# Patient Record
Sex: Female | Born: 1970 | Race: White | Hispanic: No | Marital: Married | Smoking: Never smoker
Health system: Southern US, Community
[De-identification: ages and names within clinical notes are randomized; demographics above are authoritative.]

## PROBLEM LIST (undated history)

## (undated) ENCOUNTER — Ambulatory Visit: Admission: EM | Payer: 59

## (undated) DIAGNOSIS — F419 Anxiety disorder, unspecified: Secondary | ICD-10-CM

## (undated) DIAGNOSIS — N200 Calculus of kidney: Secondary | ICD-10-CM

## (undated) DIAGNOSIS — Z87442 Personal history of urinary calculi: Secondary | ICD-10-CM

## (undated) DIAGNOSIS — M722 Plantar fascial fibromatosis: Secondary | ICD-10-CM

## (undated) DIAGNOSIS — R011 Cardiac murmur, unspecified: Secondary | ICD-10-CM

## (undated) DIAGNOSIS — I10 Essential (primary) hypertension: Secondary | ICD-10-CM

## (undated) DIAGNOSIS — K219 Gastro-esophageal reflux disease without esophagitis: Secondary | ICD-10-CM

## (undated) DIAGNOSIS — E559 Vitamin D deficiency, unspecified: Secondary | ICD-10-CM

## (undated) DIAGNOSIS — F329 Major depressive disorder, single episode, unspecified: Secondary | ICD-10-CM

## (undated) DIAGNOSIS — N184 Chronic kidney disease, stage 4 (severe): Secondary | ICD-10-CM

## (undated) DIAGNOSIS — R251 Tremor, unspecified: Secondary | ICD-10-CM

## (undated) DIAGNOSIS — E119 Type 2 diabetes mellitus without complications: Secondary | ICD-10-CM

## (undated) DIAGNOSIS — N189 Chronic kidney disease, unspecified: Secondary | ICD-10-CM

## (undated) DIAGNOSIS — M5412 Radiculopathy, cervical region: Secondary | ICD-10-CM

## (undated) DIAGNOSIS — G9050 Complex regional pain syndrome I, unspecified: Secondary | ICD-10-CM

## (undated) DIAGNOSIS — E7800 Pure hypercholesterolemia, unspecified: Secondary | ICD-10-CM

## (undated) DIAGNOSIS — G709 Myoneural disorder, unspecified: Secondary | ICD-10-CM

## (undated) DIAGNOSIS — F32A Depression, unspecified: Secondary | ICD-10-CM

## (undated) DIAGNOSIS — K760 Fatty (change of) liver, not elsewhere classified: Secondary | ICD-10-CM

## (undated) DIAGNOSIS — G8929 Other chronic pain: Secondary | ICD-10-CM

## (undated) DIAGNOSIS — D649 Anemia, unspecified: Secondary | ICD-10-CM

## (undated) DIAGNOSIS — L659 Nonscarring hair loss, unspecified: Secondary | ICD-10-CM

## (undated) HISTORY — DX: Anxiety disorder, unspecified: F41.9

## (undated) HISTORY — DX: Major depressive disorder, single episode, unspecified: F32.9

## (undated) HISTORY — DX: Complex regional pain syndrome I, unspecified: G90.50

## (undated) HISTORY — DX: Calculus of kidney: N20.0

## (undated) HISTORY — PX: HAND SURGERY: SHX662

## (undated) HISTORY — PX: PLANTAR FASCIECTOMY: SUR600

## (undated) HISTORY — DX: Other chronic pain: G89.29

## (undated) HISTORY — PX: GASTRIC BYPASS: SHX52

## (undated) HISTORY — DX: Tremor, unspecified: R25.1

## (undated) HISTORY — PX: SMALL INTESTINE SURGERY: SHX150

## (undated) HISTORY — DX: Myoneural disorder, unspecified: G70.9

## (undated) HISTORY — PX: LITHOTRIPSY: SUR834

## (undated) HISTORY — DX: Depression, unspecified: F32.A

## (undated) HISTORY — PX: CERVICAL SPINE SURGERY: SHX589

## (undated) HISTORY — DX: Essential (primary) hypertension: I10

## (undated) HISTORY — DX: Chronic kidney disease, unspecified: N18.9

---
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
## 1993-05-24 HISTORY — PX: OTHER SURGICAL HISTORY: SHX169

## 2003-05-25 HISTORY — PX: CERVICAL SPINE SURGERY: SHX589

## 2004-01-31 ENCOUNTER — Encounter: Admission: RE | Admit: 2004-01-31 | Discharge: 2004-01-31 | Payer: Self-pay | Admitting: Unknown Physician Specialty

## 2004-03-03 ENCOUNTER — Encounter: Admission: RE | Admit: 2004-03-03 | Discharge: 2004-03-03 | Payer: Self-pay | Admitting: Sports Medicine

## 2004-04-02 ENCOUNTER — Ambulatory Visit (HOSPITAL_COMMUNITY): Admission: RE | Admit: 2004-04-02 | Discharge: 2004-04-03 | Payer: Self-pay | Admitting: Neurological Surgery

## 2004-05-04 ENCOUNTER — Encounter: Admission: RE | Admit: 2004-05-04 | Discharge: 2004-05-04 | Payer: Self-pay | Admitting: Neurological Surgery

## 2004-06-23 ENCOUNTER — Ambulatory Visit: Payer: Self-pay | Admitting: Family Medicine

## 2004-07-20 ENCOUNTER — Encounter: Admission: RE | Admit: 2004-07-20 | Discharge: 2004-07-20 | Payer: Self-pay | Admitting: Neurological Surgery

## 2004-09-03 ENCOUNTER — Ambulatory Visit: Payer: Self-pay | Admitting: Urology

## 2004-09-11 ENCOUNTER — Ambulatory Visit (HOSPITAL_COMMUNITY): Admission: RE | Admit: 2004-09-11 | Discharge: 2004-09-11 | Payer: Self-pay | Admitting: Neurological Surgery

## 2004-09-17 ENCOUNTER — Ambulatory Visit: Payer: Self-pay | Admitting: Urology

## 2004-10-22 HISTORY — PX: SHOULDER SURGERY: SHX246

## 2004-11-10 ENCOUNTER — Ambulatory Visit (HOSPITAL_COMMUNITY): Admission: RE | Admit: 2004-11-10 | Discharge: 2004-11-10 | Payer: Self-pay | Admitting: Orthopaedic Surgery

## 2007-02-08 ENCOUNTER — Ambulatory Visit: Payer: Self-pay | Admitting: Anesthesiology

## 2007-04-03 ENCOUNTER — Ambulatory Visit: Payer: Self-pay | Admitting: Anesthesiology

## 2009-11-18 ENCOUNTER — Encounter: Admission: RE | Admit: 2009-11-18 | Discharge: 2009-11-18 | Payer: Self-pay | Admitting: Neurological Surgery

## 2009-11-19 ENCOUNTER — Encounter: Admission: RE | Admit: 2009-11-19 | Discharge: 2009-11-19 | Payer: Self-pay | Admitting: Neurological Surgery

## 2010-02-02 ENCOUNTER — Encounter: Admission: RE | Admit: 2010-02-02 | Discharge: 2010-02-02 | Payer: Self-pay | Admitting: Neurological Surgery

## 2010-03-05 ENCOUNTER — Ambulatory Visit: Payer: Self-pay | Admitting: Urology

## 2010-03-10 ENCOUNTER — Ambulatory Visit: Payer: Self-pay | Admitting: Urology

## 2010-03-17 ENCOUNTER — Encounter
Admission: RE | Admit: 2010-03-17 | Discharge: 2010-05-01 | Payer: Self-pay | Source: Home / Self Care | Attending: Physical Medicine & Rehabilitation | Admitting: Physical Medicine & Rehabilitation

## 2010-03-23 ENCOUNTER — Ambulatory Visit: Payer: Self-pay | Admitting: Physical Medicine & Rehabilitation

## 2010-05-01 ENCOUNTER — Ambulatory Visit: Payer: Self-pay | Admitting: Physical Medicine & Rehabilitation

## 2010-06-08 ENCOUNTER — Encounter
Admission: RE | Admit: 2010-06-08 | Discharge: 2010-06-11 | Payer: Self-pay | Source: Home / Self Care | Attending: Physical Medicine & Rehabilitation | Admitting: Physical Medicine & Rehabilitation

## 2010-06-11 ENCOUNTER — Ambulatory Visit
Admission: RE | Admit: 2010-06-11 | Discharge: 2010-06-11 | Payer: Self-pay | Source: Home / Self Care | Attending: Physical Medicine & Rehabilitation | Admitting: Physical Medicine & Rehabilitation

## 2010-06-13 ENCOUNTER — Encounter: Payer: Self-pay | Admitting: Orthopedic Surgery

## 2010-07-14 ENCOUNTER — Ambulatory Visit: Payer: Self-pay | Admitting: Physical Medicine & Rehabilitation

## 2010-07-16 ENCOUNTER — Ambulatory Visit: Payer: Commercial Indemnity | Admitting: Physical Medicine & Rehabilitation

## 2010-07-16 ENCOUNTER — Encounter: Payer: Commercial Indemnity | Attending: Physical Medicine & Rehabilitation

## 2010-07-16 DIAGNOSIS — M5412 Radiculopathy, cervical region: Secondary | ICD-10-CM

## 2010-07-16 DIAGNOSIS — Z79899 Other long term (current) drug therapy: Secondary | ICD-10-CM | POA: Insufficient documentation

## 2010-07-16 DIAGNOSIS — M25519 Pain in unspecified shoulder: Secondary | ICD-10-CM | POA: Insufficient documentation

## 2010-07-16 DIAGNOSIS — M961 Postlaminectomy syndrome, not elsewhere classified: Secondary | ICD-10-CM

## 2010-07-16 DIAGNOSIS — IMO0001 Reserved for inherently not codable concepts without codable children: Secondary | ICD-10-CM | POA: Insufficient documentation

## 2010-07-16 DIAGNOSIS — M779 Enthesopathy, unspecified: Secondary | ICD-10-CM

## 2010-07-16 DIAGNOSIS — M5382 Other specified dorsopathies, cervical region: Secondary | ICD-10-CM

## 2010-08-07 ENCOUNTER — Ambulatory Visit: Payer: Self-pay | Admitting: Urology

## 2010-08-14 NOTE — Assessment & Plan Note (Signed)
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  She is doing some more physical therapy to work on some myofascial pain in the infraspinatus and trapezius area.  She has had no other new problems in the interval time.  Her pain is about 4/10 on average.  Her current pain meds include Zanaflex 2 mg b.i.d. and four nightly as well as gabapentin 300 q.i.d.  Her pain increases with driving in the left shoulder area.  She can walk 60 minutes at a time.  She climbs steps.  She drives.  She needs help with certain meal prep and household duties, otherwise independent.  REVIEW OF SYSTEMS:  Weakness, numbness, tingling, spasms.  Blood pressure 127/72, pulse 84, respirations 18, O2 sat 98% on room air.  IMPRESSION: 1. Cervical post-laminectomy syndrome, chronic radiculitis. 2. Myofascial pain syndrome, left infraspinatus and trapezius.  PLAN: 1. Continue physical therapy for her myofascial pain syndrome. 2. Increase her gabapentin to 400 q.i.d. for her chronic radiculitis. 3. We will continue Zanaflex on a p.r.n. basis in terms of her     myofascial pain, and she also will take ibuprofen on a p.r.n. basis     for postexercise soreness.  I will see her back in 1 month.     Charlett Blake, M.D. Electronically Signed    AEK/MedQ D:  07/16/2010 15:02:21  T:  07/16/2010 20:56:08  Job #:  [id number]

## 2010-08-21 ENCOUNTER — Ambulatory Visit: Payer: Commercial Indemnity | Admitting: Physical Medicine & Rehabilitation

## 2010-08-27 ENCOUNTER — Ambulatory Visit: Payer: Commercial Indemnity | Admitting: Physical Medicine & Rehabilitation

## 2010-09-03 ENCOUNTER — Ambulatory Visit: Payer: Commercial Indemnity | Admitting: Physical Medicine & Rehabilitation

## 2010-09-04 ENCOUNTER — Encounter: Payer: Commercial Indemnity | Attending: Physical Medicine & Rehabilitation

## 2010-09-04 ENCOUNTER — Ambulatory Visit: Payer: Commercial Indemnity | Admitting: Physical Medicine & Rehabilitation

## 2010-09-04 DIAGNOSIS — M961 Postlaminectomy syndrome, not elsewhere classified: Secondary | ICD-10-CM | POA: Insufficient documentation

## 2010-09-04 DIAGNOSIS — IMO0001 Reserved for inherently not codable concepts without codable children: Secondary | ICD-10-CM | POA: Insufficient documentation

## 2010-09-04 DIAGNOSIS — M25519 Pain in unspecified shoulder: Secondary | ICD-10-CM | POA: Insufficient documentation

## 2010-09-04 DIAGNOSIS — Z79899 Other long term (current) drug therapy: Secondary | ICD-10-CM | POA: Insufficient documentation

## 2010-09-04 DIAGNOSIS — M5412 Radiculopathy, cervical region: Secondary | ICD-10-CM

## 2010-09-04 DIAGNOSIS — M7520 Bicipital tendinitis, unspecified shoulder: Secondary | ICD-10-CM

## 2010-09-05 NOTE — Assessment & Plan Note (Signed)
REASON FOR VISIT:  Left shoulder pain.  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  Physical therapist felt the patient may have had some bicipital tenosynovitis and was given an iontophoresis portable disposable unit.  This has been helpful.  CURRENT MEDICATIONS: 1. Zanaflex 2 mg b.i.d. and 4 mg at bedtime. 2. Neurontin 400 mg q.i.d.  Pain increases with driving in left shoulder area.  Her pain level is 4/10.  Sleep is fair.  Relief from meds is good.  A 60-minute walking tolerance.  She can climb steps.  She drives.  She needs help with certain meal prep and household duties.  She has numbness, tingling, and spasms on review of systems.  Left anterior deltoid region does not have any pain currently but she just took off her iontophoresis patch.  Generally that is where it hurts her the most.  She has negative impingement sign.  Upper extremity strength is good.  She does have some sensory deficits in the right C6 and C7 dermatomal distribution.  She has no pain over the pectoralis area.  Blood pressure 120/60, pulse 92, respirations 18, and O2 saturations 97% on room air.  Orientation x3.  Affect is bright and alert.  Gait is normal.  IMPRESSION: 1. Cervical postlaminectomy syndrome, chronic left upper extremity     radiculitis C6 and C7 dermatomal distribution. 2. Myofascial pain syndrome, left infraspinatus and trapezius,     improved. 3. Left biceps tenosynovitis, overall improved with iontophoresis, but     she has done the maximum number of treatments for this.  PLAN: 1. Continue gabapentin q.i.d. for chronic radiculitis. 2. Continue Zanaflex p.r.n. for myofascial pain 2 mg b.i.d. and 5 at     bedtime. 3. We will write compounded ketoprofen 20% q.i.d. to anterior shoulder     p.r.n. and I will see her back in 2 months'  time.     Charlett Blake, M.D. Electronically Signed    AEK/MedQ D:  09/04/2010 16:39:16  T:  09/05/2010 05:11:27  Job #:  [id number]
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
## 2010-10-09 NOTE — Op Note (Signed)
NAME:  Sabrina Williams, Sabrina Williams             ACCOUNT NO.:  [id number]   MEDICAL RECORD NO.:  [id number]          PATIENT TYPE:  OIB   LOCATION:  2870                         FACILITY:  [facility]   PHYSICIAN:  Lind Guest. Ninfa Linden, M.D.DATE OF BIRTH:  May 09, 1971   DATE OF PROCEDURE:  11/10/2004  DATE OF DISCHARGE:  11/10/2004                                 OPERATIVE REPORT   PREOPERATIVE DIAGNOSIS:  Left shoulder internal derangement and impingement.   POSTOPERATIVE DIAGNOSIS:  Left shoulder internal derangement and  impingement.   PROCEDURE:  Left shoulder arthroscopic limited debridement and subacromial  decompression.   SURGEON:  Lind Guest. Ninfa Linden, M.D.   ANESTHESIA:  1.  Regional left shoulder block.  2.  General anesthesia.   BLOOD LOSS:  Minimal.   INDICATION:  [name] is a 40 year old female with a history of left  shoulder pain.  She had been dealing with this pain for about a whole since  she received   DICTATION ENDED AT THIS POINT.     _    CYB/MEDQ  D:  11/10/2004  T:  11/11/2004  Job:  [id number]

## 2010-10-09 NOTE — Op Note (Signed)
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   PROCEDURE:  1.  Decompressive anterior cervical discectomy C6-C7 for nerve root      decompression.  2.  Anterior cervical arthrodesis C6-C7 utilizing a VG4 8 mm allograft.  3.  Anterior cervical plating C6-C7 utilizing a 25 mm Zephyr plate.   SURGEON:  Eustace Moore, MD   ASSISTANT:  Faythe Ghee, M.D.   ANESTHESIA:  General endotracheal anesthesia.   COMPLICATIONS:  None apparent.   INDICATIONS FOR PROCEDURE:  Sabrina Williams is a 40 year old white female who was  referred to the neurosurgery clinic with complaints of left arm pain in the  C7 distribution.  She had CT myelogram which showed a herniated disc in the  foramen of C6-C7 on the left side with obliteration of the left C7 nerve  root.  I recommended anterior cervical discectomy with fusion and plating at  C6-C7.  She understood the risks, the benefits, and the expected outcome and  wished to proceed.   DESCRIPTION OF PROCEDURE:  The patient was taken to the operating room and  after induction of adequate general endotracheal anesthesia, she was placed  in the supine position on the operating table.  Her right anterior cervical  region was prepped with DuraPrep and then draped in the usual sterile  fashion.  3 mL of local anesthesia was injected and an incision was made to  the right of midline and carried down to the platysmal muscle.  The platysma  was elevated, opened, and undermined with  Metzenbaum scissors.  I then  dissected in a plane medial to the sternocleidomastoid muscle and internal  carotid artery and lateral to the tracheal and esophagus to expose the  anterior cervical spine at C6-C7.  Interoperative fluoroscopy confirmed our  level.  Then, the longus colli muscles were taken down and the G[place]am  retractors were placed to expose the anterior cervical spine at C6-C7.  The  annulus was incised and the initial discectomy was done with pituitary  rongeurs and curved curets.  We then took the drill and drilled down the  endplates to the level of the posterior longitudinal ligament.  The  operating microscope was brought onto the field and the posterior  longitudinal ligament was opened with a nerve hook and then removed  circumferentially along with the posterior osteophytes with a Kerrison  punch.  On the left side, a generous foraminotomy was performed.  The left  C7 nerve root was identified.  There were several small free fragments  identified in the foramen which were removed  with a combination of nerve  hook and pituitary rongeurs.  We then palpated with the nerve hook to insure  that there was no more free fragments and no more compression of the left C7  nerve root.  We then performed a foraminotomy on the right side.  We then  irrigated the surgical bed and dried the bleeding points with Surgifoam.  We  then measured the interspace to be 8 mm and placed an 8 mm VG4 allograft  into the interspace at C6-C7.  We then placed a 25 mm Zephyr plate with two  13 mm screws in the body of C6 and two in the body of C7 and locked these  into position with the slot mechanism.  We then copiously irrigated with  Bacitracin containing saline solution, dried all bleeding points with  bipolar cautery, and once meticulous hemostasis was achieved, we closed the  platysma with 3-0 Vicryl, the subcutaneous tissue was closed with 3-0  Vicryl, and the skin was closed with  Dermabond.  The drapes were removed.  The patient was awakened from general anesthesia and transported to the  recovery room in stable condition.  At the end of the procedure, all sponge,  needle, and instrument counts were correct.       DSJ/MEDQ  D:  04/02/2004  T:  04/02/2004  Job:  [id number]

## 2010-11-03 ENCOUNTER — Ambulatory Visit: Payer: Commercial Indemnity | Admitting: Physical Medicine & Rehabilitation

## 2010-11-17 ENCOUNTER — Encounter: Payer: Commercial Indemnity | Attending: Physical Medicine & Rehabilitation

## 2010-11-17 ENCOUNTER — Ambulatory Visit: Payer: Commercial Indemnity | Admitting: Physical Medicine & Rehabilitation

## 2010-11-17 DIAGNOSIS — M5412 Radiculopathy, cervical region: Secondary | ICD-10-CM | POA: Insufficient documentation

## 2010-11-17 DIAGNOSIS — M25519 Pain in unspecified shoulder: Secondary | ICD-10-CM | POA: Insufficient documentation

## 2010-11-17 DIAGNOSIS — M961 Postlaminectomy syndrome, not elsewhere classified: Secondary | ICD-10-CM | POA: Insufficient documentation

## 2010-11-17 DIAGNOSIS — Z79899 Other long term (current) drug therapy: Secondary | ICD-10-CM | POA: Insufficient documentation

## 2010-11-17 DIAGNOSIS — M5382 Other specified dorsopathies, cervical region: Secondary | ICD-10-CM

## 2010-11-17 DIAGNOSIS — IMO0001 Reserved for inherently not codable concepts without codable children: Secondary | ICD-10-CM | POA: Insufficient documentation

## 2010-11-18 NOTE — Assessment & Plan Note (Signed)
A 40 year old female with history of chronic left C7 radiculitis.  She has had herniated nucleus pulposus C6-7 level, underwent surgical evaluation ACDF C6-7 level, 6 years ago, was having chronic pain, left upper extremity.  We started on Neurontin which was very helpful.  We gradually increased the dose to the current dose of 400 mg q.i.d.  In addition, she was placed on Zanaflex 2 mg b.i.d. and 4 mg q.h.s. at last visit.  She went through physical therapy, does her home exercise program with stretching as well as TENS unit.  This has been helpful as well, but does not have do this very often with a TENS at least.  She has left biceps tenosynovitis improved on iontophoresis through physical therapy.  We started her with compounded ketoprofen 20% q.i.d. in the anterior shoulder.  She has done very well with that, no longer has pain in that area, uses this now only about twice a week.  Walking tolerance 30 minutes.  She can climb steps.  She can drive.  She has anxiety, numbness, and tingling in the left upper extremity.  She needs help with certain household duties, but otherwise independent.  PAST HISTORY:  Significant for diabetes.  SOCIAL HISTORY:  Married, nonsmoker, nondrinker.  PHYSICAL EXAMINATION:  VITAL SIGNS:  Blood pressure 120/61, pulse 96, respirations 20, O2 sat 96% on room air. GENERAL:  A well-developed overweight female in no acute distress. Orientation x3.  Affect alert.  Gait is normal. EXTREMITIES:  Without edema. MUSCULOSKELETAL:  Her deep tendon reflexes decreased left triceps, otherwise normal.  Sensation decreased at left C7 otherwise normal. Strength is normal bilateral deltoid biceps, triceps grip.  She has good neck range of motion.  No tenderness over the shoulder area.  IMPRESSION: 1. Chronic radiculitis improved on gabapentin. 2. Biceps tendonitis, improved after combination of physical therapy     as well as ketoprofen cream. 3. Myofascial pain,  left parascapular area.  Continue with her TENS     unit on p.r.n. basis as well as Zanaflex.  Discussed with patient, agrees with plan.  I will see her back in 4 months.     Charlett Blake, M.D. Electronically Signed    AEK/MedQ D:  11/17/2010 16:04:08  T:  11/18/2010 01:46:43  Job #:  [id number]

## 2010-11-23 ENCOUNTER — Ambulatory Visit: Payer: Self-pay | Admitting: Family Medicine

## 2010-12-20 ENCOUNTER — Ambulatory Visit: Payer: Self-pay | Admitting: Internal Medicine

## 2010-12-23 ENCOUNTER — Ambulatory Visit: Payer: Self-pay | Admitting: Family Medicine

## 2011-03-08 ENCOUNTER — Encounter: Payer: Commercial Indemnity | Attending: Physical Medicine & Rehabilitation

## 2011-03-08 ENCOUNTER — Ambulatory Visit: Payer: Commercial Indemnity | Admitting: Physical Medicine & Rehabilitation

## 2011-03-08 DIAGNOSIS — M961 Postlaminectomy syndrome, not elsewhere classified: Secondary | ICD-10-CM

## 2011-03-08 DIAGNOSIS — M542 Cervicalgia: Secondary | ICD-10-CM | POA: Insufficient documentation

## 2011-03-08 DIAGNOSIS — M25519 Pain in unspecified shoulder: Secondary | ICD-10-CM | POA: Insufficient documentation

## 2011-03-08 DIAGNOSIS — M62838 Other muscle spasm: Secondary | ICD-10-CM | POA: Insufficient documentation

## 2011-03-08 DIAGNOSIS — M79609 Pain in unspecified limb: Secondary | ICD-10-CM | POA: Insufficient documentation

## 2011-03-08 NOTE — Assessment & Plan Note (Signed)
CHIEF COMPLAINT:  Increased neck pain.  HISTORY OF PRESENT ILLNESS:  A 40 year old female with long history of left upper extremity pain prior history of cervical herniated nucleus pulposus C6-7 toward the left, underwent ACDF C6-7 level.  She had some shoulder pain.  She had a small intrasubstance tear of the subscapularis tendon on the shoulder MRI, subacromial decompression was not helpful. She has had a cervical myelogram last December 2011.  Degenerative changes at C3-4 C5-6, well fused C6-7 section.  Her pain is 2-3/10 down from a 6 out that she was 1 year ago when I 1st saw her.  She, however, states that after being on Vicodin and Percocet for dental procedures she noticed how much her pain really was.  She feels mainly at spasms or tightness.  She really does not want to be on Vicodin or Percocet on a long-term basis.  She is finishing up some of the extensive dental work that she has been undergoing lately.  Her pain is described as sharp, burning, dull, stabbing, tingling, aching constant.  She has some popping in her neck when she moves it.  REVIEW OF SYSTEMS:  Positive for numbness, spasms, anxiety.  PAST MEDICAL HISTORY:  Depression, on Zoloft.  PHYSICAL EXAMINATION:  VITAL SIGNS:  Blood pressure 108/64, pulse 98, respirations 16, and O2 sat 97% on room air. GENERAL:  Obese female in no acute distress.  Orientation x3.  Affect alert.  Gait is normal. NECK:  Range of motion is full. EXTREMITIES:  Her upper extremity strength is normal.  She has 3+ reflexes, bilateral biceps, triceps, brachioradialis.  Sensation is mildly reduced in the left C6 dermatomal distribution.  Neck has no tenderness to palpation in the cervical paraspinals, has some mild tenderness around the left upper trap.  IMPRESSION:  Cervical post laminectomy syndrome with associated muscle spasm which is myofascial related.  PLAN: 1. We will increase her Zanaflex to 4 mg t.i.d 2. I will plan to see  her back in 1 month given that she feel like her     neck pain has been exacerbated perhaps by all the positioning from     the dental procedures.  We will see whether we have to increase on     her Zanaflex once again versus perhaps starting some tramadol     judiciously given that she is already on a SSRI.  I discussed with     the patient, agrees with plan.  No imaging studies needed.     Charlett Blake, M.D. Electronically Signed    AEK/MedQ D:  03/08/2011 10:56:35  T:  03/08/2011 15:42:06  Job #:  [id number]

## 2011-04-06 ENCOUNTER — Ambulatory Visit: Payer: Commercial Indemnity | Admitting: Physical Medicine & Rehabilitation

## 2011-04-06 ENCOUNTER — Encounter: Payer: Commercial Indemnity | Attending: Physical Medicine & Rehabilitation

## 2011-04-06 DIAGNOSIS — M961 Postlaminectomy syndrome, not elsewhere classified: Secondary | ICD-10-CM

## 2011-04-06 DIAGNOSIS — M25519 Pain in unspecified shoulder: Secondary | ICD-10-CM | POA: Insufficient documentation

## 2011-04-06 DIAGNOSIS — M62838 Other muscle spasm: Secondary | ICD-10-CM | POA: Insufficient documentation

## 2011-04-06 DIAGNOSIS — M542 Cervicalgia: Secondary | ICD-10-CM | POA: Insufficient documentation

## 2011-04-06 DIAGNOSIS — M79609 Pain in unspecified limb: Secondary | ICD-10-CM | POA: Insufficient documentation

## 2011-04-06 DIAGNOSIS — M25529 Pain in unspecified elbow: Secondary | ICD-10-CM

## 2011-04-06 NOTE — Assessment & Plan Note (Signed)
REASON FOR VISIT:  Increasing neck pain.  HISTORY:  A 40 year old female with a long history of left upper extremity pain and neck pain.  She has a history of cervical herniated nucleus pulposus C6-7 towards the left side, undergoing a left ACDF at C6-7 level.  She had further workup for her shoulder pain after her neck surgery did not totally relieve her shoulder pain.  She had a small intrasubstance tear over subscapularis tendon on the shoulder MRI. Subacromial decompression was not helpful.  She had a cervical myelogram in December 2011, degenerative changes at C3-4, C5-6, well fused at C6- 7.  Pain had been doing quite well, even off Percocet or Vicodin.  PAST HISTORY:  On Zoloft.  INTERVAL MEDICAL HISTORY:  We did increase her Zanaflex to 4 mg t.i.d. which was partially helpful, although still has some times where she really feels like she needs something more than that.  She denies any new weakness in the left upper extremity.  Continues to have some numbness in the left hand, but this has been chronic.  ALLERGIES:  PENICILLIN.  OTHER MEDICATIONS:  Zoloft for depression was increased to 100 mg, but feels like her depression is doing better overall and her plan with her primary care physician to reduce it back 50 at some point.  REVIEW OF SYSTEMS:  Positive for numbness, tremor, tingling, spasms, depression, and anxiety.  PHYSICAL EXAMINATION:  Obese female in no acute distress.  Blood pressure 123/66, pulse 88, respirations 16, O2 saturation 98% on room air, weight 328 pounds.  Her neck range of motion is good, gets about 75% range forward flexion, extension, lateral rotation, and bending.  Negative Spurling's maneuver. No pain to palpation over the cervical paraspinals or over the upper trapezius area.  Her shoulder has negative impingement sign, negative AC joint problem, negative crossed reduction maneuver.  Upper extremity strength is normal.  Upper extremity reflex  is 3+ bilaterally, symmetric.  Strength is normal.  Sensation reduced in the left C6 dermatomal distribution.  Gait shows no evidence of toe drag or knee instability.  Mood and affect are appropriate.  IMPRESSION: 1. Cervical post laminectomy syndrome.  Chronic neck and shoulder     pain.  I think the shoulder pain is referred. 2. Left chronic C6 radiculitis. 3. Increasing pain.  I question whether she may be developing more of     a herniated disk at C5-6 level, the adjacent level.  We will     further investigate with MRI.  I will see her back after the MRI.     Discuss further treatment options.     Charlett Blake, M.D. Electronically Signed   AEK/MedQ D:  04/06/2011 12:20:33  T:  04/06/2011 12:43:11  Job #:  [id number]

## 2011-04-21 ENCOUNTER — Other Ambulatory Visit: Payer: Self-pay | Admitting: Physical Medicine & Rehabilitation

## 2011-04-21 DIAGNOSIS — M542 Cervicalgia: Secondary | ICD-10-CM

## 2011-04-22 ENCOUNTER — Other Ambulatory Visit: Payer: Commercial Indemnity

## 2011-04-24 ENCOUNTER — Other Ambulatory Visit: Payer: Commercial Indemnity

## 2011-04-30 ENCOUNTER — Ambulatory Visit: Payer: Commercial Indemnity | Admitting: Physical Medicine & Rehabilitation

## 2011-04-30 ENCOUNTER — Encounter: Payer: Commercial Indemnity | Attending: Physical Medicine & Rehabilitation

## 2011-04-30 DIAGNOSIS — M961 Postlaminectomy syndrome, not elsewhere classified: Secondary | ICD-10-CM

## 2011-04-30 DIAGNOSIS — M542 Cervicalgia: Secondary | ICD-10-CM | POA: Insufficient documentation

## 2011-04-30 DIAGNOSIS — M25529 Pain in unspecified elbow: Secondary | ICD-10-CM

## 2011-04-30 DIAGNOSIS — M25519 Pain in unspecified shoulder: Secondary | ICD-10-CM | POA: Insufficient documentation

## 2011-04-30 DIAGNOSIS — M79609 Pain in unspecified limb: Secondary | ICD-10-CM | POA: Insufficient documentation

## 2011-04-30 DIAGNOSIS — M62838 Other muscle spasm: Secondary | ICD-10-CM | POA: Insufficient documentation

## 2011-04-30 NOTE — Assessment & Plan Note (Signed)
HISTORY:  A 40 year old female with chief complaint of left upper extremity and neck pain.  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  She had a cervical myelogram in September 2011 showing an intact fusion at C6-C7 and degenerative changes at C3-4, C4-5, C5-6. She had a normal EMG on December 22, 2009 per Dr. Hennie Duos.  She has had problems with shoulder pain and underwent arthroscopic surgery as well as subacromial decompression which was not helpful in 2006.  PAST SURGICAL HISTORY:  Positive for lithotripsy in 2007, 2003, 2002, 2006, and C-section in [employer].  SOCIAL HISTORY:  Married, lives with her husband.  Two sons.  Denies any drug or alcohol use.  FAMILY HISTORY:  No family history of drug or alcohol use.  REVIEW OF SYSTEMS:  Numbness in the left hand unchanged.  She indicates that there has been no change to her usual symptoms of neck pain and left upper extremity pain.  FUNCTIONAL ABILITIES:  She is independent with all her basic ADLs, but needs some help with meal prep, household duties, and shopping.  CURRENT MEDICATIONS: 1. Zanaflex 4 mg t.i.d. 2. Tramadol 50 mg b.i.d. which was helpful when we started last month. 3. Neurontin 400 q.i.d. 4. In addition, she has been reduced from her 100 mg of Zoloft to 50.  PHYSICAL EXAMINATION:  VITAL SIGNS:  Low pressure 134/62, pulse 101, respirations 16, O2 saturation 98% on room air. GENERAL:  No acute distress.  Mood and affect appropriate. MUSCULOSKELETAL:  She has 3+ upper extremity reflexes, biceps, triceps, brachioradialis, and a bit more brisk on the right than on the left side.  Neck range of motion is 75% range, forward flexion, extension, lateral rotation, bending.  Negative Spurling maneuver.  No pain to palpation of cervical paraspinals.  Shoulder, negative impingement sign and negative AC joint with cross adduction maneuver.  Sensation reduced left C6 dermatomal  distribution.  Gait is normal. Mood and affect are appropriate.  Strength is normal.  In bilateral upper extremities, she has no tenderness to palpation in the upper traps on the parascapular musculature or the cervical paraspinals.  IMPRESSION: 1. Cervical post laminectomy syndrome, chronic neck and shoulder pain. 2. Left chronic C6 radiculitis.  PLAN: 1. Given that her symptoms have regressed to her usual I really do not     think there is any need to pursue the cervical MRI, certainly no     change in her symptomatology. 2. In terms of medications, we will continue Zanaflex 4 mg t.i.d.,     Neurontin 400 mg q.i.d., and increase her tramadol to 50 t.i.d. 3. I will see her back in 4 months.  If she does well, we will stretch     her out to 6 months and even up to a yearly basis depending on her     stability of symptoms.  Discussed with patient, agrees with plan.     Charlett Blake, M.D. Electronically Signed    AEK/MedQ D:  04/30/2011 13:58:01  T:  04/30/2011 18:56:15  Job #:  [id number]

## 2011-08-22 ENCOUNTER — Ambulatory Visit: Payer: Self-pay | Admitting: Family Medicine

## 2011-08-27 ENCOUNTER — Ambulatory Visit: Payer: Commercial Indemnity | Admitting: Physical Medicine & Rehabilitation

## 2011-08-30 ENCOUNTER — Telehealth: Payer: Self-pay | Admitting: Physical Medicine & Rehabilitation

## 2011-08-30 NOTE — Telephone Encounter (Signed)
Had knee injury week and 1/2 ago.  Urgent Care gave her pain meds and told her to go to  Ortho.  Went to Ortho, but he would not give her meds, that she was to get from Dr Letta Pate.  Please call.

## 2011-08-30 NOTE — Telephone Encounter (Signed)
Pt aware that she will need to be seen in order to get medication. She has an appointment coming on 09/06/11. States that she is dealing with her pain on her own for now.

## 2011-08-31 ENCOUNTER — Encounter: Payer: Self-pay | Admitting: Physical Medicine & Rehabilitation

## 2011-09-06 ENCOUNTER — Ambulatory Visit: Payer: Commercial Indemnity | Admitting: Physical Medicine & Rehabilitation

## 2011-09-07 ENCOUNTER — Encounter: Payer: Commercial Indemnity | Attending: Physical Medicine & Rehabilitation

## 2011-09-07 ENCOUNTER — Encounter: Payer: Self-pay | Admitting: Physical Medicine & Rehabilitation

## 2011-09-07 ENCOUNTER — Ambulatory Visit (HOSPITAL_BASED_OUTPATIENT_CLINIC_OR_DEPARTMENT_OTHER): Payer: Commercial Indemnity | Admitting: Physical Medicine & Rehabilitation

## 2011-09-07 VITALS — BP 130/78 | HR 98 | Resp 16 | Ht 68.0 in | Wt 312.0 lb

## 2011-09-07 DIAGNOSIS — M961 Postlaminectomy syndrome, not elsewhere classified: Secondary | ICD-10-CM

## 2011-09-07 DIAGNOSIS — M5412 Radiculopathy, cervical region: Secondary | ICD-10-CM | POA: Insufficient documentation

## 2011-09-07 DIAGNOSIS — M25569 Pain in unspecified knee: Secondary | ICD-10-CM | POA: Insufficient documentation

## 2011-09-07 MED ORDER — TIZANIDINE HCL 4 MG PO TABS: 4.0000 mg | ORAL_TABLET | Freq: Three times a day (TID) | ORAL | Status: DC

## 2011-09-07 NOTE — Patient Instructions (Signed)
I am not prescribing narcotic analgesics for this patient. There is no controlled substance agreement. Therefore if another physician feels narcotic analgesics are necessary to control pain, they can be prescribed without risk of being discharged from this clinic. Charlett Blake M.D.

## 2011-09-07 NOTE — Progress Notes (Signed)
  Subjective:    Patient ID: Sabrina Williams, female    DOB: [birth year], 41 y.o.   MRN: [id number]  HPI Still has occasional left trapezius pain which is managed with cream and medications. In addition has had left knee pain following a beach trip as well as a fall. She was evaluated in urgent care. X-rays showed no fracture but did show arthritis. She is followed up with orthopedic surgery and has had benefit from a left knee injection Pain Inventory Average Pain 3 Pain Right Now 2 My pain is constant, sharp and burning  In the last 24 hours, has pain interfered with the following? General activity 2 Relation with others 2 Enjoyment of life 2 What TIME of day is your pain at its worst? evening Sleep (in general) Good  Pain is worse with: some activites Pain improves with: medication Relief from Meds: 8  Mobility walk without assistance how many minutes can you walk? 60+ ability to climb steps?  yes do you drive?  yes Do you have any goals in this area?  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  no  Physicians involved in your care Any changes since last visit?  no   Review of Systems  Musculoskeletal:       Shoulder pain  All other systems reviewed and are negative.       Objective:   Physical Exam  Constitutional: She is oriented to person, place, and time.  Neck: Normal range of motion.  Musculoskeletal:       Left shoulder: Normal.  Neurological: She is alert and oriented to person, place, and time. She has normal strength. A sensory deficit is present.  Reflex Scores:      Tricep reflexes are 3+ on the right side and 3+ on the left side.      Bicep reflexes are 3+ on the right side and 3+ on the left side.      Brachioradialis reflexes are 3+ on the right side and 3+ on the left side.      Patellar  reflexes are 2+ on the right side and 2+ on the left side.      Achilles reflexes are 2+ on the right side and 2+ on the left side.      Sensation reduced in the left C6 dermatome  Psychiatric: She has a normal mood and affect.    Left knee has no evidence of effusion. There is good range of motion. There is some medial tenderness. Upper extremity strength is 5/5 in the deltoid, biceps, triceps, grip      Assessment & Plan:  #1. Chronic left C6 radiculopathy. 2. Cervical post laminectomy syndrome 3. Left knee pain likely arthritic following up with orthopedics for this Will continue current medications. We are not prescribing narcotic analgesics in this clinic. Other physicians can prescribe narcotics since she is not under a controlled substance agreement with our clinic.

## 2011-09-09 ENCOUNTER — Ambulatory Visit: Payer: Commercial Indemnity | Admitting: Physical Medicine & Rehabilitation

## 2011-09-17 ENCOUNTER — Other Ambulatory Visit: Payer: Self-pay | Admitting: Physical Medicine & Rehabilitation

## 2011-09-21 ENCOUNTER — Ambulatory Visit: Payer: Commercial Indemnity | Admitting: Physical Medicine & Rehabilitation

## 2011-10-14 ENCOUNTER — Other Ambulatory Visit: Payer: Self-pay | Admitting: Physical Medicine & Rehabilitation

## 2011-12-08 ENCOUNTER — Other Ambulatory Visit: Payer: Self-pay | Admitting: Physical Medicine & Rehabilitation

## 2012-02-08 ENCOUNTER — Ambulatory Visit: Payer: Self-pay | Admitting: Physical Medicine & Rehabilitation

## 2012-02-10 ENCOUNTER — Other Ambulatory Visit: Payer: Self-pay | Admitting: Physical Medicine & Rehabilitation

## 2012-02-15 ENCOUNTER — Encounter: Payer: Self-pay | Admitting: Physical Medicine & Rehabilitation

## 2012-02-15 ENCOUNTER — Ambulatory Visit (HOSPITAL_BASED_OUTPATIENT_CLINIC_OR_DEPARTMENT_OTHER): Payer: Private Health Insurance - Indemnity | Admitting: Physical Medicine & Rehabilitation

## 2012-02-15 ENCOUNTER — Encounter: Payer: Private Health Insurance - Indemnity | Attending: Physical Medicine & Rehabilitation

## 2012-02-15 VITALS — BP 115/64 | HR 107 | Resp 14 | Ht 68.0 in | Wt 337.0 lb

## 2012-02-15 DIAGNOSIS — M5412 Radiculopathy, cervical region: Secondary | ICD-10-CM | POA: Insufficient documentation

## 2012-02-15 DIAGNOSIS — M25569 Pain in unspecified knee: Secondary | ICD-10-CM | POA: Insufficient documentation

## 2012-02-15 DIAGNOSIS — M79609 Pain in unspecified limb: Secondary | ICD-10-CM | POA: Insufficient documentation

## 2012-02-15 DIAGNOSIS — M722 Plantar fascial fibromatosis: Secondary | ICD-10-CM | POA: Insufficient documentation

## 2012-02-15 DIAGNOSIS — F411 Generalized anxiety disorder: Secondary | ICD-10-CM | POA: Insufficient documentation

## 2012-02-15 DIAGNOSIS — I10 Essential (primary) hypertension: Secondary | ICD-10-CM | POA: Insufficient documentation

## 2012-02-15 DIAGNOSIS — M961 Postlaminectomy syndrome, not elsewhere classified: Secondary | ICD-10-CM

## 2012-02-15 DIAGNOSIS — Z79899 Other long term (current) drug therapy: Secondary | ICD-10-CM | POA: Insufficient documentation

## 2012-02-15 DIAGNOSIS — E119 Type 2 diabetes mellitus without complications: Secondary | ICD-10-CM | POA: Insufficient documentation

## 2012-02-15 MED ORDER — KETOPROFEN POWD: 1.0000 "application " | Freq: Three times a day (TID) | Status: DC | PRN

## 2012-02-15 MED ORDER — TRAMADOL HCL 50 MG PO TABS: 50.0000 mg | ORAL_TABLET | Freq: Three times a day (TID) | ORAL | Status: DC | PRN

## 2012-02-15 NOTE — Progress Notes (Signed)
Subjective:    Patient ID: Sabrina Williams, female    DOB: 02/26/1971, 41 y.o.   MRN: [id number]  HPI Has developed increasing knee pain as well as intermittent foot pain. Has been treated for plantar fasciitis. We talked about ice massage Medications are overall working well however she has more anxiety and would like to go up on her Zoloft and realizes that it may interact with her Ultram Pain Inventory Average Pain 3 Pain Right Now 2 My pain is constant, burning, dull, stabbing, tingling and aching  In the last 24 hours, has pain interfered with the following? General activity 3 Relation with others 1 Enjoyment of life 2 What TIME of day is your pain at its worst? evening Sleep (in general) Good  Pain is worse with: some activites Pain improves with: rest and medication Relief from Meds: 7  Mobility walk without assistance how many minutes can you walk? 30 ability to climb steps?  yes do you drive?  yes Do you have any goals in this area?  yes  Function not employed: date last employed  I need assistance with the following:  meal prep, household duties and shopping  Neuro/Psych anxiety  Prior Studies Any changes since last visit?  no  Physicians involved in your care Any changes since last visit?  no   Family History  Problem Relation Age of Onset  . Cancer Mother     multiple myeloma  . Diabetes Father   . Kidney disease Father   . Heart disease Father    History   Social History  . Marital Status: Single    Spouse Name: N/A    Number of Children: N/A  . Years of Education: N/A   Social History Main Topics  . Smoking status: Never Smoker   . Smokeless tobacco: Never Used  . Alcohol Use: None  . Drug Use: None  . Sexually Active: None   Other Topics Concern  . None   Social History Narrative  . None   Past Surgical History  Procedure Date  . Small intestine surgery     cervical laminectomy  . Utereroscopy 1995  . Lithotripsy    multiple  . Shoulder surgery 6/06  . Cesarean section     x2   Past Medical History  Diagnosis Date  . Neuromuscular disorder   . Diabetes mellitus   . Depression   . Hypertension   . Chronic kidney disease     stones   BP 115/64  Pulse 107  Resp 14  Ht 5\' 8"  (1.727 m)  Wt 337 lb (152.862 kg)  BMI 51.24 kg/m2  SpO2 96%     Review of Systems  Musculoskeletal: Positive for myalgias and arthralgias.  Psychiatric/Behavioral: The patient is nervous/anxious.   All other systems reviewed and are negative.       Objective:   Physical Exam  Constitutional: She appears well-developed.       Obese No acute distress Mood and affect are appropriate Motor strength is 5/5 in bilateral upper and lower tremor these Musculoskeletal has tenderness over the plantar fascia lateral aspect of the heel bilaterally Straight leg raising test is negative Knee has crepitus with flexion-extension No joint swelling Sensation reduced left C6 dermatome          Assessment & Plan:  1. Chronic cervical radiculitis continue gabapentin #2. Plantar fasciitis recommend ice massage every morning 3. Cervical postlaminectomy syndrome continue tramadol. Since she is only taking 50 mg at a time  I think it would be reasonable to increase her Zoloft to 75 mg. I'll see her back in 5 months. She is to call if the above changes are not adequate

## 2012-02-15 NOTE — Patient Instructions (Signed)
Aquatic exercise program Frozen water bottle for ice massage to feet May try increased Zoloft to 75 mg

## 2012-03-10 ENCOUNTER — Other Ambulatory Visit: Payer: Self-pay | Admitting: Physical Medicine & Rehabilitation

## 2012-05-22 ENCOUNTER — Other Ambulatory Visit: Payer: Self-pay | Admitting: Physical Medicine & Rehabilitation

## 2012-06-23 ENCOUNTER — Other Ambulatory Visit: Payer: Self-pay | Admitting: Physical Medicine & Rehabilitation

## 2012-06-27 ENCOUNTER — Other Ambulatory Visit: Payer: Self-pay | Admitting: Physical Medicine & Rehabilitation

## 2012-07-10 ENCOUNTER — Ambulatory Visit: Payer: Private Health Insurance - Indemnity | Admitting: Physical Medicine & Rehabilitation

## 2012-07-18 ENCOUNTER — Encounter: Payer: BC Managed Care – PPO | Attending: Physical Medicine & Rehabilitation

## 2012-07-18 ENCOUNTER — Ambulatory Visit (HOSPITAL_BASED_OUTPATIENT_CLINIC_OR_DEPARTMENT_OTHER): Payer: BC Managed Care – PPO | Admitting: Physical Medicine & Rehabilitation

## 2012-07-18 ENCOUNTER — Encounter: Payer: Self-pay | Admitting: Physical Medicine & Rehabilitation

## 2012-07-18 VITALS — BP 126/66 | HR 87 | Resp 14 | Ht 68.0 in | Wt 338.8 lb

## 2012-07-18 DIAGNOSIS — M961 Postlaminectomy syndrome, not elsewhere classified: Secondary | ICD-10-CM

## 2012-07-18 DIAGNOSIS — M546 Pain in thoracic spine: Secondary | ICD-10-CM | POA: Insufficient documentation

## 2012-07-18 DIAGNOSIS — M5412 Radiculopathy, cervical region: Secondary | ICD-10-CM | POA: Insufficient documentation

## 2012-07-18 DIAGNOSIS — I10 Essential (primary) hypertension: Secondary | ICD-10-CM | POA: Insufficient documentation

## 2012-07-18 DIAGNOSIS — M722 Plantar fascial fibromatosis: Secondary | ICD-10-CM | POA: Insufficient documentation

## 2012-07-18 DIAGNOSIS — E119 Type 2 diabetes mellitus without complications: Secondary | ICD-10-CM | POA: Insufficient documentation

## 2012-07-18 DIAGNOSIS — R29898 Other symptoms and signs involving the musculoskeletal system: Secondary | ICD-10-CM | POA: Insufficient documentation

## 2012-07-18 MED ORDER — GABAPENTIN 400 MG PO CAPS: 400.0000 mg | ORAL_CAPSULE | Freq: Four times a day (QID) | ORAL | Status: DC

## 2012-07-18 MED ORDER — TRAMADOL HCL 50 MG PO TABS: 50.0000 mg | ORAL_TABLET | Freq: Three times a day (TID) | ORAL | Status: DC | PRN

## 2012-07-18 MED ORDER — TIZANIDINE HCL 4 MG PO TABS: 4.0000 mg | ORAL_TABLET | Freq: Three times a day (TID) | ORAL | Status: DC | PRN

## 2012-07-18 NOTE — Progress Notes (Signed)
Subjective:    Patient ID: Sabrina Williams, female    DOB: August 27, 1970, 42 y.o.   MRN: [id number]  HPI   Plantar fasciitis pain continues Discussed ice massage Also having pain in the left upper back area this responds to massage Some problems with the left hand. Unable to do knitting. Doing loom knitting  Pain Inventory Average Pain 4 Pain Right Now 2 My pain is sharp, burning, dull, stabbing, tingling and aching  In the last 24 hours, has pain interfered with the following? General activity 1 Relation with others 0 Enjoyment of life 2 What TIME of day is your pain at its worst? evening Sleep (in general) Good  Pain is worse with: some activites Pain improves with: rest, therapy/exercise, medication and TENS Relief from Meds: 7  Mobility walk without assistance how many minutes can you walk? 45 ability to climb steps?  yes do you drive?  yes  Function not employed: date last employed 2006 I need assistance with the following:  meal prep, household duties and shopping  Neuro/Psych No problems in this area  Prior Studies Any changes since last visit?  no  Physicians involved in your care Any changes since last visit?  no   Family History  Problem Relation Age of Onset  . Cancer Mother     multiple myeloma  . Diabetes Father   . Kidney disease Father   . Heart disease Father    History   Social History  . Marital Status: Single    Spouse Name: N/A    Number of Children: N/A  . Years of Education: N/A   Social History Main Topics  . Smoking status: Never Smoker   . Smokeless tobacco: Never Used  . Alcohol Use: None  . Drug Use: None  . Sexually Active: None   Other Topics Concern  . None   Social History Narrative  . None   Past Surgical History  Procedure Laterality Date  . Small intestine surgery      cervical laminectomy  . Utereroscopy  1995  . Lithotripsy      multiple  . Shoulder surgery  6/06  . Cesarean section      x2    Past Medical History  Diagnosis Date  . Neuromuscular disorder   . Diabetes mellitus   . Depression   . Hypertension   . Chronic kidney disease     stones   BP 126/66  Pulse 87  Resp 14  Ht 5\' 8"  (1.727 m)  Wt 338 lb 12.8 oz (153.679 kg)  BMI 51.53 kg/m2  SpO2 97%    Review of Systems  Musculoskeletal:       Left shoulder/arm pain  All other systems reviewed and are negative.       Objective:   Physical Exam  Obese No acute distress Mood and affect are appropriate Motor strength is 5/5 in bilateral upper and lower ext Musculoskeletal has tenderness over the plantar fascia lateral aspect of the heel bilaterally Straight leg raising test is negative Knee has crepitus with flexion-extension No joint swelling Sensation reduced left C6 dermatome        Assessment & Plan:  . Chronic cervical radiculitis continue gabapentin  #2. Plantar fasciitis recommend ice massage every morning  3. Cervical postlaminectomy syndrome continue tramadol.  4. Myofascial pain left rhomboids. Patient does massage to help with this we also discussed laying on a tennis ball and slightly moving  Patient is in a very good place right  now she is quite happy with the self-management that she has used with great success over the last year or so. We can extend her visit to every 6 months and then possibly even longer. Certainly we can see her back sooner should she have a significant flareup.

## 2012-07-18 NOTE — Patient Instructions (Signed)
Frozen water bottle ice massage for plantar fasciitis every morning Lay on tennis ball around the shoulder blade area if this becomes more painful Continue current medications

## 2012-09-26 ENCOUNTER — Telehealth: Payer: Self-pay

## 2012-09-26 NOTE — Telephone Encounter (Signed)
Refill request for meloxicam.  Please advise.

## 2012-09-26 NOTE — Telephone Encounter (Signed)
I don't see where we have been prescribing this. His check with patient sees another physician I have done the rx

## 2012-09-27 NOTE — Telephone Encounter (Signed)
Request denied.

## 2012-10-23 ENCOUNTER — Other Ambulatory Visit: Payer: Self-pay | Admitting: *Deleted

## 2012-10-23 MED ORDER — GABAPENTIN 400 MG PO CAPS: 400.0000 mg | ORAL_CAPSULE | Freq: Four times a day (QID) | ORAL | Status: DC

## 2013-01-08 ENCOUNTER — Other Ambulatory Visit: Payer: Self-pay

## 2013-01-08 MED ORDER — TRAMADOL HCL 50 MG PO TABS: 50.0000 mg | ORAL_TABLET | Freq: Three times a day (TID) | ORAL | Status: DC | PRN

## 2013-01-12 ENCOUNTER — Other Ambulatory Visit: Payer: Self-pay

## 2013-01-12 ENCOUNTER — Encounter: Payer: BC Managed Care – PPO | Attending: Physical Medicine & Rehabilitation

## 2013-01-12 ENCOUNTER — Ambulatory Visit (HOSPITAL_BASED_OUTPATIENT_CLINIC_OR_DEPARTMENT_OTHER): Payer: BC Managed Care – PPO | Admitting: Physical Medicine & Rehabilitation

## 2013-01-12 ENCOUNTER — Encounter: Payer: Self-pay | Admitting: Physical Medicine & Rehabilitation

## 2013-01-12 VITALS — BP 128/73 | HR 100 | Resp 14 | Ht 68.0 in | Wt 330.8 lb

## 2013-01-12 DIAGNOSIS — M961 Postlaminectomy syndrome, not elsewhere classified: Secondary | ICD-10-CM

## 2013-01-12 DIAGNOSIS — M722 Plantar fascial fibromatosis: Secondary | ICD-10-CM | POA: Insufficient documentation

## 2013-01-12 DIAGNOSIS — Z79899 Other long term (current) drug therapy: Secondary | ICD-10-CM

## 2013-01-12 DIAGNOSIS — M5412 Radiculopathy, cervical region: Secondary | ICD-10-CM | POA: Insufficient documentation

## 2013-01-12 DIAGNOSIS — Z5181 Encounter for therapeutic drug level monitoring: Secondary | ICD-10-CM

## 2013-01-12 MED ORDER — KETOPROFEN POWD: 1.0000 "application " | Freq: Three times a day (TID) | Status: DC | PRN

## 2013-01-12 MED ORDER — TIZANIDINE HCL 4 MG PO TABS: 4.0000 mg | ORAL_TABLET | Freq: Three times a day (TID) | ORAL | Status: DC

## 2013-01-12 NOTE — Addendum Note (Signed)
Addended by: Amado Coe on: 01/12/2013 11:54 AM   Modules accepted: Orders

## 2013-01-12 NOTE — Patient Instructions (Addendum)
Please call for a trigger point injection if you have increasing pain in the left posterior shoulder area call pharmacy when you are running low on tramadol and we can call and some additional refills

## 2013-01-12 NOTE — Progress Notes (Signed)
Subjective:    Patient ID: Sabrina Williams, female    DOB: Jan 19, 1971, 42 y.o.   MRN: [id number]  HPI Slip on grass in April, fell and hit left knee and Left arm.  Increased neck pain since then.  Left arm felt "sprained"  As well as shock sensations on outer arm and ring finger rather than thumb and middle finger Symptoms have now subsided.  Pain Inventory Average Pain 4 Pain Right Now 5 My pain is intermittent, dull, stabbing, tingling and aching  In the last 24 hours, has pain interfered with the following? General activity 8 Relation with others 7 Enjoyment of life 5 What TIME of day is your pain at its worst? evening Sleep (in general) Good  Pain is worse with: some activites Pain improves with: heat/ice and medication Relief from Meds: 5  Mobility walk without assistance  Function not employed: date last employed .  Neuro/Psych weakness numbness tingling  Prior Studies Any changes since last visit?  no  Physicians involved in your care Any changes since last visit?  no   Family History  Problem Relation Age of Onset  . Cancer Mother     multiple myeloma  . Diabetes Father   . Kidney disease Father   . Heart disease Father    History   Social History  . Marital Status: Single    Spouse Name: N/A    Number of Children: N/A  . Years of Education: N/A   Social History Main Topics  . Smoking status: Never Smoker   . Smokeless tobacco: Never Used  . Alcohol Use: None  . Drug Use: None  . Sexual Activity: None   Other Topics Concern  . None   Social History Narrative  . None   Past Surgical History  Procedure Laterality Date  . Small intestine surgery      cervical laminectomy  . Utereroscopy  1995  . Lithotripsy      multiple  . Shoulder surgery  6/06  . Cesarean section      x2   Past Medical History  Diagnosis Date  . Neuromuscular disorder   . Diabetes mellitus   . Depression   . Hypertension   . Chronic kidney disease    stones   BP 128/73  Pulse 100  Resp 14  Ht 5\' 8"  (1.727 m)  Wt 330 lb 12.8 oz (150.05 kg)  BMI 50.31 kg/m2  SpO2 95%   Review of Systems  HENT: Positive for neck pain.        Arm pain  Neurological: Positive for weakness and numbness.       Tingling  All other systems reviewed and are negative.       Objective:   Physical Exam  Nursing note and vitals reviewed. Constitutional: She is oriented to person, place, and time. She appears well-developed and well-nourished.  HENT:  Head: Normocephalic and atraumatic.  Eyes: Conjunctivae and EOM are normal. Pupils are equal, round, and reactive to light.  Neurological: She is alert and oriented to person, place, and time. She has normal strength and normal reflexes. A sensory deficit is present. Gait normal.  Decreased sensation left C6 left C7 distribution  Psychiatric: She has a normal mood and affect.   Left infraspinatus trigger point       Assessment & Plan:  1. Chronic cervical radiculitis continue gabapentin  2. Plantar fasciitis recommend ice massage every morning  3. Cervical postlaminectomy syndrome continue tramadol. Will continue current dose. Control  substance agreement form filled out. Discussed the form. Exacerbation has resolved RTC 6 months or sooner if she decides to get a trigger point injection We can give 5 refills

## 2013-02-05 ENCOUNTER — Other Ambulatory Visit: Payer: Self-pay

## 2013-02-05 MED ORDER — TRAMADOL HCL 50 MG PO TABS: 50.0000 mg | ORAL_TABLET | Freq: Three times a day (TID) | ORAL | Status: DC | PRN

## 2013-02-06 ENCOUNTER — Telehealth: Payer: Self-pay

## 2013-02-06 MED ORDER — TIZANIDINE HCL 4 MG PO TABS: 4.0000 mg | ORAL_TABLET | Freq: Three times a day (TID) | ORAL | Status: DC

## 2013-02-06 NOTE — Telephone Encounter (Signed)
Patient called [facility] in [place] and they don't have a RX for Tizanidine for the patient.

## 2013-02-06 NOTE — Telephone Encounter (Signed)
Tizanidine refill today electronically.  The pharmacy had a script on hold from February that they filled.

## 2013-02-06 NOTE — Telephone Encounter (Signed)
Called pharmacy and they say Dr Letta Pate filled tizanadine today.

## 2013-03-02 ENCOUNTER — Encounter: Payer: Self-pay | Admitting: *Deleted

## 2013-03-06 ENCOUNTER — Ambulatory Visit (INDEPENDENT_AMBULATORY_CARE_PROVIDER_SITE_OTHER): Payer: BC Managed Care – PPO | Admitting: Podiatry

## 2013-03-06 ENCOUNTER — Encounter: Payer: Self-pay | Admitting: Podiatry

## 2013-03-06 ENCOUNTER — Ambulatory Visit (INDEPENDENT_AMBULATORY_CARE_PROVIDER_SITE_OTHER): Payer: BC Managed Care – PPO

## 2013-03-06 VITALS — BP 111/71 | HR 89 | Resp 16 | Ht 68.0 in | Wt 330.0 lb

## 2013-03-06 DIAGNOSIS — R52 Pain, unspecified: Secondary | ICD-10-CM

## 2013-03-06 DIAGNOSIS — M722 Plantar fascial fibromatosis: Secondary | ICD-10-CM

## 2013-03-06 DIAGNOSIS — M7750 Other enthesopathy of unspecified foot: Secondary | ICD-10-CM

## 2013-03-06 MED ORDER — TRIAMCINOLONE ACETONIDE 10 MG/ML IJ SUSP
5.0000 mg | Freq: Once | INTRAMUSCULAR | Status: AC
  Administered 2013-03-06: 5 mg via INTRA_ARTICULAR

## 2013-03-06 NOTE — Patient Instructions (Signed)
Ice therapy 2x day and reduced activity for the next several days

## 2013-03-06 NOTE — Progress Notes (Signed)
N popping  L left foot top toward lateral side  D 3- 4 weeks  O all of a sudden  C about the same  A when walking  T dr Noemi Chapel , walkin boot and [employer]

## 2013-03-06 NOTE — Progress Notes (Signed)
Subjective:     Patient ID: Sabrina Williams, female   DOB: February 25, 1971, 42 y.o.   MRN: [id number]  Foot Pain   patient presents stating that her left foot has been hurting the last 3-4 weeks. She saw Dr. Noemi Chapel who placed her in an air fracture walker which has not helped and also on Mobic 15 mg daily does not remember specific injury or twist   Review of Systems  All other systems reviewed and are negative.       Objective:   Physical Exam  Nursing note and vitals reviewed. Constitutional: She appears well-developed and well-nourished.  Cardiovascular: Intact distal pulses.   Musculoskeletal: Normal range of motion.  Neurological: She is alert.  Skin: Skin is warm.   dorsum of the left foot on the lateral side indicates inflammation around the extensor tendons and the  peroneus muscle group. I also noted the bottom of the right heel is moderately tender     Assessment:     Tendinitis of the left lateral foot and mild plantar fasciitis right    Plan:     H&P performed and x-ray of the left foot reviewed. I injected the left lateral foot 3 mg Kenalog 5 mg Xylocaine Marcaine mixture and advised on ice. Reviewed continued Mobic usage and air fracture walker as needed

## 2013-03-19 ENCOUNTER — Encounter: Payer: Self-pay | Admitting: *Deleted

## 2013-03-20 ENCOUNTER — Ambulatory Visit (INDEPENDENT_AMBULATORY_CARE_PROVIDER_SITE_OTHER): Payer: BC Managed Care – PPO | Admitting: Podiatry

## 2013-03-20 ENCOUNTER — Encounter: Payer: Self-pay | Admitting: Podiatry

## 2013-03-20 VITALS — BP 117/64 | HR 96 | Resp 12 | Ht 68.0 in | Wt 330.0 lb

## 2013-03-20 DIAGNOSIS — M7750 Other enthesopathy of unspecified foot: Secondary | ICD-10-CM

## 2013-03-20 NOTE — Progress Notes (Signed)
Subjective:     Patient ID: Sabrina Williams, female   DOB: [birth year], 42 y.o.   MRN: [id number]  Foot Pain   patient states that her left foot is still bothering her some but much better and she feels like it is still popping   Review of Systems     Objective:   Physical Exam  Nursing note and vitals reviewed. Constitutional: She is oriented to person, place, and time. She appears well-developed and well-nourished.  Cardiovascular: Intact distal pulses.   Musculoskeletal: Normal range of motion.  Neurological: She is oriented to person, place, and time.   The patient's left foot is much better when pressed from a dorsal direction with no indications of joint movement or other bony pathology    Assessment:     Tendinitis left which appears to be improving with injection treatment    Plan:     Advised on aggressive ice and continued immobilization to be done at home. If symptoms were to worsen we will need to consider MRI of condition

## 2013-06-10 ENCOUNTER — Other Ambulatory Visit: Payer: Self-pay | Admitting: Physical Medicine & Rehabilitation

## 2013-06-11 ENCOUNTER — Other Ambulatory Visit: Payer: Self-pay

## 2013-06-11 MED ORDER — TRAMADOL HCL 50 MG PO TABS: 50.0000 mg | ORAL_TABLET | Freq: Three times a day (TID) | ORAL | Status: DC | PRN

## 2013-07-09 ENCOUNTER — Ambulatory Visit: Payer: BC Managed Care – PPO | Admitting: Physical Medicine & Rehabilitation

## 2013-07-13 ENCOUNTER — Other Ambulatory Visit: Payer: Self-pay | Admitting: Physical Medicine & Rehabilitation

## 2013-07-13 ENCOUNTER — Other Ambulatory Visit: Payer: Self-pay

## 2013-07-13 MED ORDER — TRAMADOL HCL 50 MG PO TABS: 50.0000 mg | ORAL_TABLET | Freq: Three times a day (TID) | ORAL | Status: DC | PRN

## 2013-07-13 NOTE — Telephone Encounter (Signed)
Tramadol called into pharmacy

## 2013-07-19 ENCOUNTER — Ambulatory Visit: Payer: BC Managed Care – PPO | Admitting: Physical Medicine & Rehabilitation

## 2013-08-03 ENCOUNTER — Telehealth: Payer: Self-pay | Admitting: *Deleted

## 2013-08-03 NOTE — Telephone Encounter (Signed)
Please find an appt with Dr Letta Pate for Sabrina Williams.  She is a 6 month follow up and needs to see a provider and not RN med mgmnt.

## 2013-08-06 ENCOUNTER — Encounter: Payer: BC Managed Care – PPO | Attending: Physical Medicine & Rehabilitation

## 2013-08-06 ENCOUNTER — Encounter: Payer: Self-pay | Admitting: Physical Medicine & Rehabilitation

## 2013-08-06 ENCOUNTER — Ambulatory Visit (HOSPITAL_BASED_OUTPATIENT_CLINIC_OR_DEPARTMENT_OTHER): Payer: BC Managed Care – PPO | Admitting: Physical Medicine & Rehabilitation

## 2013-08-06 VITALS — BP 165/92 | HR 125 | Resp 14 | Ht 68.0 in | Wt 332.0 lb

## 2013-08-06 DIAGNOSIS — I10 Essential (primary) hypertension: Secondary | ICD-10-CM | POA: Insufficient documentation

## 2013-08-06 DIAGNOSIS — M5412 Radiculopathy, cervical region: Secondary | ICD-10-CM | POA: Insufficient documentation

## 2013-08-06 DIAGNOSIS — R209 Unspecified disturbances of skin sensation: Secondary | ICD-10-CM | POA: Insufficient documentation

## 2013-08-06 DIAGNOSIS — M961 Postlaminectomy syndrome, not elsewhere classified: Secondary | ICD-10-CM | POA: Insufficient documentation

## 2013-08-06 DIAGNOSIS — M542 Cervicalgia: Secondary | ICD-10-CM | POA: Insufficient documentation

## 2013-08-06 DIAGNOSIS — E119 Type 2 diabetes mellitus without complications: Secondary | ICD-10-CM | POA: Insufficient documentation

## 2013-08-06 DIAGNOSIS — M722 Plantar fascial fibromatosis: Secondary | ICD-10-CM | POA: Insufficient documentation

## 2013-08-06 MED ORDER — GABAPENTIN 400 MG PO CAPS: 400.0000 mg | ORAL_CAPSULE | Freq: Four times a day (QID) | ORAL | Status: DC

## 2013-08-06 MED ORDER — KETOPROFEN POWD: 1.0000 "application " | Freq: Three times a day (TID) | Status: DC | PRN

## 2013-08-06 MED ORDER — TIZANIDINE HCL 4 MG PO TABS: 4.0000 mg | ORAL_TABLET | Freq: Three times a day (TID) | ORAL | Status: DC

## 2013-08-06 MED ORDER — TRAMADOL-ACETAMINOPHEN 37.5-325 MG PO TABS: 1.0000 | ORAL_TABLET | Freq: Three times a day (TID) | ORAL | Status: DC | PRN

## 2013-08-06 NOTE — Progress Notes (Signed)
Subjective:    Patient ID: Sabrina Williams, female    DOB: [birth year], 43 y.o.   MRN: [id number]  HPI New medical problems since last visit on 01/17/2013. No kidney stones. Problems with sensation in the left fingers, mainly index. Had a burn while cooking recently that she did not feel initially.  Continues to have left sided neck pain which is only partially relieved with medications, massage, heat. Continues to do stretching as well as strengthening exercises using thera bands Pain Inventory Average Pain 4 Pain Right Now 3 My pain is constant, sharp, burning, dull, tingling and aching  In the last 24 hours, has pain interfered with the following? General activity 1 Relation with others 1 Enjoyment of life 1 What TIME of day is your pain at its worst? evening, night Sleep (in general) Good  Pain is worse with: some activites Pain improves with: rest and medication Relief from Meds: 7  Mobility walk without assistance how many minutes can you walk? 45 ability to climb steps?  yes do you drive?  yes transfers alone Do you have any goals in this area?  yes  Function not employed: date last employed na I need assistance with the following:  meal prep, household duties and shopping Do you have any goals in this area?  no  Neuro/Psych weakness numbness tingling spasms anxiety  Prior Studies Any changes since last visit?  no  Physicians involved in your care Any changes since last visit?  no   Family History  Problem Relation Age of Onset  . Cancer Mother     multiple myeloma  . Diabetes Father   . Kidney disease Father   . Heart disease Father    History   Social History  . Marital Status: Single    Spouse Name: N/A    Number of Children: N/A  . Years of Education: N/A   Social History Main Topics  . Smoking status: Never Smoker   . Smokeless tobacco: Never Used  . Alcohol Use: No  . Drug Use: No  . Sexual Activity: None   Other Topics  Concern  . None   Social History Narrative  . None   Past Surgical History  Procedure Laterality Date  . Small intestine surgery      cervical laminectomy  . Utereroscopy  1995  . Lithotripsy      multiple  . Shoulder surgery  6/06  . Cesarean section      x2   Past Medical History  Diagnosis Date  . Neuromuscular disorder   . Diabetes mellitus   . Depression   . Hypertension   . Chronic kidney disease     stones   BP 165/92  Pulse 125  Resp 14  Ht _0  (1.727 m)  Wt 332 lb (150.594 kg)  BMI 50.49 kg/m2  SpO2 96%  Opioid Risk Score:   Fall Risk Score: Low Fall Risk (0-5 points) (pt educated on fall risk, brochure given to pt)    Review of Systems  Musculoskeletal: Positive for neck pain.  Neurological: Positive for weakness and numbness.       Tingling, spasms  Psychiatric/Behavioral: The patient is nervous/anxious.   All other systems reviewed and are negative.       Objective:   Physical Exam  Constitutional: She is oriented to person, place, and time.  Musculoskeletal:       Cervical back: She exhibits tenderness. She exhibits normal range of motion.  Tender to palpation  Trapezius , levator scapular, infraspinatus  Neurological: She is alert and oriented to person, place, and time. She has normal strength. A sensory deficit is present. She exhibits normal muscle tone.  Reflex Scores:      Tricep reflexes are 2+ on the right side and 2+ on the left side.      Bicep reflexes are 3+ on the right side and 3+ on the left side.      Brachioradialis reflexes are 3+ on the right side and 3+ on the left side.      Patellar reflexes are 3+ on the right side and 3+ on the left side.      Achilles reflexes are 3+ on the right side and 3+ on the left side.  Decreased sensation to pinprick left C6 dermatome     Assessment & Plan:  1. Chronic cervical radiculitis continue gabapentin 451m QID 2. Plantar fasciitis generally improve, see ortho 3. Cervical  postlaminectomy syndrome patient would like to increase on Zoloft secondary to increased anxiety symptoms. Because of this we will reduce tramadol to 37.5 mg 4 times per day. Discussed the form.  Discussed serotonin syndrome RTC 6 months or sooner if she decides to get a trigger point injection  We can give 5 refills

## 2013-08-06 NOTE — Patient Instructions (Signed)
Call if lower dose tramadol is not helpful  Call if you need to schedule a trigger point injection

## 2013-08-21 ENCOUNTER — Ambulatory Visit: Payer: Self-pay | Admitting: Urology

## 2013-08-21 LAB — PREGNANCY, URINE: Pregnancy Test, Urine: NEGATIVE m[IU]/mL

## 2013-09-07 ENCOUNTER — Telehealth: Payer: Self-pay

## 2013-09-07 NOTE — Telephone Encounter (Signed)
Patient called to let us know she had a wisdom tooth removed and her dentist gave her hydrocodone.

## 2013-09-10 NOTE — Telephone Encounter (Signed)
OK 

## 2013-10-24 ENCOUNTER — Telehealth: Payer: Self-pay

## 2013-10-24 NOTE — Telephone Encounter (Signed)
Patient states that since decreasing the Tramadol that her pain has increased. She is requesting to increase Zanaflex from TID to QID to see if that will help. Please advise.

## 2013-10-25 MED ORDER — TIZANIDINE HCL 4 MG PO TABS: 4.0000 mg | ORAL_TABLET | Freq: Four times a day (QID) | ORAL | Status: DC

## 2013-10-25 NOTE — Telephone Encounter (Signed)
Ok to increase Zanaflex to QID

## 2013-10-25 NOTE — Telephone Encounter (Signed)
Attempted to contact patient. Left a voicemail to return call to clinic.

## 2013-10-25 NOTE — Telephone Encounter (Signed)
Patient returned call to clinic. Informed patient per Dr. Letta Pate it is okay to take Tizanidine QID. Reordered medication to accommodate increase.

## 2013-11-21 ENCOUNTER — Ambulatory Visit: Payer: Self-pay | Admitting: Obstetrics and Gynecology

## 2013-11-21 HISTORY — PX: OTHER SURGICAL HISTORY: SHX169

## 2013-12-11 ENCOUNTER — Ambulatory Visit (INDEPENDENT_AMBULATORY_CARE_PROVIDER_SITE_OTHER): Payer: BC Managed Care – PPO

## 2013-12-11 ENCOUNTER — Ambulatory Visit (INDEPENDENT_AMBULATORY_CARE_PROVIDER_SITE_OTHER): Payer: BC Managed Care – PPO | Admitting: Podiatry

## 2013-12-11 VITALS — BP 148/81 | HR 85 | Resp 16

## 2013-12-11 DIAGNOSIS — M722 Plantar fascial fibromatosis: Secondary | ICD-10-CM

## 2013-12-11 DIAGNOSIS — M7660 Achilles tendinitis, unspecified leg: Secondary | ICD-10-CM

## 2013-12-11 MED ORDER — TRIAMCINOLONE ACETONIDE 10 MG/ML IJ SUSP
10.0000 mg | Freq: Once | INTRAMUSCULAR | Status: AC
  Administered 2013-12-11: 10 mg

## 2013-12-11 NOTE — Patient Instructions (Signed)

## 2013-12-11 NOTE — Progress Notes (Signed)
Subjective:     Patient ID: Sabrina Williams, female   DOB: 1970/06/14, 43 y.o.   MRN: [id number]  HPI patient presents stating she is getting more pain in her right foot in the back of the heel and underneath the heel and states that it is becoming increasingly difficult for ambulation. States that she is trying to lose weight but is unable to exercise  Review of Systems     Objective:   Physical Exam Neurovascular status intact with no other health history changes noted and discomfort in the posterior lateral heel and the plantar medial heel distal to the insertion into the calcaneus right foot    Assessment:     Plantar fasciitis right foot and the Achilles tendinitis right which may be compensatory or primary problem    Plan:     Educated patient on both problems and today did very careful posterior lateral injection 3 mg dexamethasone Kenalog 5 mg Xylocaine and before doing this did discuss the chances for rupture associated with this and also injected the plantar heel 3 mg Kenalog 5 mg I can Marcaine mixture and gave instructions on exercises. Patient has a air fracture walker at home and will wear this for the next 2 weeks and will be seen back in 3 weeks may require surgery on the plantar heel

## 2013-12-18 ENCOUNTER — Other Ambulatory Visit: Payer: Self-pay | Admitting: Physical Medicine & Rehabilitation

## 2013-12-22 HISTORY — PX: ENDOSCOPIC PLANTAR FASCIOTOMY: SUR443

## 2014-01-01 ENCOUNTER — Ambulatory Visit (INDEPENDENT_AMBULATORY_CARE_PROVIDER_SITE_OTHER): Payer: BC Managed Care – PPO | Admitting: Podiatry

## 2014-01-01 ENCOUNTER — Encounter: Payer: Self-pay | Admitting: Podiatry

## 2014-01-01 VITALS — BP 112/68 | HR 96 | Resp 12

## 2014-01-01 DIAGNOSIS — M722 Plantar fascial fibromatosis: Secondary | ICD-10-CM

## 2014-01-01 DIAGNOSIS — M7660 Achilles tendinitis, unspecified leg: Secondary | ICD-10-CM

## 2014-01-01 DIAGNOSIS — M069 Rheumatoid arthritis, unspecified: Secondary | ICD-10-CM

## 2014-01-01 DIAGNOSIS — M7750 Other enthesopathy of unspecified foot: Secondary | ICD-10-CM

## 2014-01-01 MED ORDER — TRIAMCINOLONE ACETONIDE 10 MG/ML IJ SUSP
10.0000 mg | Freq: Once | INTRAMUSCULAR | Status: AC
  Administered 2014-01-01: 10 mg

## 2014-01-01 NOTE — Progress Notes (Signed)
Subjective:     Patient ID: Sabrina Williams, female   DOB: [birth year], 43 y.o.   MRN: [id number]  HPI patient presents complaining of burning in the posterior heel plantar heel and around the ankle stating that she could not wear the boot she cannot wear the night splint she takes gabapentin for her on and her foot is driving her crazy   Review of Systems     Objective:   Physical Exam Neurovascular status intact with patient who does have obesity which is certainly a complicating factor with pain in the posterior heel region around the Achilles tendon plantar heel and especially within the sinus tarsi right    Assessment:     Very difficult to make complete assessment of this patient do to multiple health problems and medicines that she takes. I did explain there may be a systemic origin or problem and that also there may be a radiculopathy occurring from her neck and back    Plan:     Reviewed condition and today injected the sinus tarsi 3 mg Kenalog 5 mg Xylocaine Marcaine mixture and sent for blood work to rule out an inflammatory systemic condition. Reappoint in 1 week

## 2014-01-02 LAB — URIC ACID: Uric Acid: 5.9 mg/dL (ref 2.5–7.1)

## 2014-01-02 LAB — ANA: Anti Nuclear Antibody(ANA): NEGATIVE

## 2014-01-02 LAB — C-REACTIVE PROTEIN: CRP: 6.3 mg/L — ABNORMAL HIGH (ref 0.0–4.9)

## 2014-01-02 LAB — SEDIMENTATION RATE: Sed Rate: 26 mm/hr (ref 0–32)

## 2014-01-02 LAB — RHEUMATOID FACTOR: Rhuematoid fact SerPl-aCnc: <7 IU/mL (ref 0.0–13.9)

## 2014-01-08 ENCOUNTER — Ambulatory Visit (INDEPENDENT_AMBULATORY_CARE_PROVIDER_SITE_OTHER): Payer: BC Managed Care – PPO | Admitting: Podiatry

## 2014-01-08 VITALS — BP 123/73 | HR 92 | Resp 16

## 2014-01-08 DIAGNOSIS — M722 Plantar fascial fibromatosis: Secondary | ICD-10-CM

## 2014-01-08 NOTE — Progress Notes (Signed)
Subjective:     Patient ID: Sabrina Williams, female   DOB: 02-03-1971, 43 y.o.   MRN: [id number]  HPI patient presents stating that the injection helped her ankle but she continues to have discomfort in her heel and feels like all of a sudden started from the plantar heel and everything seems to have come from that. States she is so tired of the pain and inability to be active   Review of Systems     Objective:   Physical Exam Neurovascular status is unchanged with discomfort of an intense nature plantar right heel around the insertion to the calcaneus and slightly distal and moderate discomfort also in the posterior heel around the Achilles tendon insertion    Assessment:     Long-term plan her fasciitis with also moderate Achilles tendinitis which is probably compensation in nature with blood work that is essentially normal with mild elevation and inflammatory marker    Plan:     Reviewed condition and at great length and discussed different options. She is willing to take risk in order to try to get this better and prefers a surgery of the plantar heel hoping that this will help with all her other problems if she can walk better. At this point I did allow her to read a consent form going over this and the fact there is absolutely no long-term guarantees this will cure her foot problems and that she may develop other problems because of this and it may not solve the problem of her Achilles tendon. She is willing to understand and she is willing to undergo risk and at this point she signs consent form after this review and is given all preoperative instructions and is scheduled for outpatient surgery and is dispensed a new short air fracture walker for the postoperative period

## 2014-01-15 ENCOUNTER — Encounter: Payer: Self-pay | Admitting: Podiatry

## 2014-01-15 DIAGNOSIS — M722 Plantar fascial fibromatosis: Secondary | ICD-10-CM

## 2014-01-16 ENCOUNTER — Telehealth: Payer: Self-pay

## 2014-01-16 NOTE — Telephone Encounter (Signed)
Spoke with pt regarding post operative status, she states that she is doing well but in pain, advised he to layer in ibuprofen in between pain medication doses to help with inflammation and pain, also advised to remain in boot and keep sterile dressing dry. Watch for s/s of infection ie fever chills n/v

## 2014-01-16 NOTE — Progress Notes (Signed)
1. Endoscopic release medial band rt heel  Dos 8.25.15

## 2014-01-18 ENCOUNTER — Other Ambulatory Visit: Payer: Self-pay | Admitting: Podiatry

## 2014-01-18 ENCOUNTER — Telehealth: Payer: Self-pay | Admitting: *Deleted

## 2014-01-18 MED ORDER — HYDROCODONE-ACETAMINOPHEN 10-325 MG PO TABS: 1.0000 | ORAL_TABLET | ORAL | Status: DC | PRN

## 2014-01-18 NOTE — Telephone Encounter (Signed)
Per Dr. Paulla Dolly okay to refill Vicodin #30 no refills. Notified patient.

## 2014-01-18 NOTE — Telephone Encounter (Signed)
Pt called requesting more pain medication, she is having a lot of pain. Can hardly put foot down.  Asked patient if she felt her bandage was too tight, she stated yes. i told her to loosen up her ace bandage , make sure she was icing and will ask dr Paulla Dolly about more pain medication

## 2014-01-22 ENCOUNTER — Ambulatory Visit (INDEPENDENT_AMBULATORY_CARE_PROVIDER_SITE_OTHER): Payer: BC Managed Care – PPO | Admitting: Podiatry

## 2014-01-22 VITALS — BP 137/88 | HR 103 | Resp 16

## 2014-01-22 DIAGNOSIS — M722 Plantar fascial fibromatosis: Secondary | ICD-10-CM

## 2014-01-22 MED ORDER — HYDROCODONE-ACETAMINOPHEN 10-325 MG PO TABS: 1.0000 | ORAL_TABLET | Freq: Three times a day (TID) | ORAL | Status: DC | PRN

## 2014-01-22 NOTE — Progress Notes (Signed)
Subjective:     Patient ID: Sabrina Williams, female   DOB: 1970-08-18, 43 y.o.   MRN: [id number]  HPI patient states it's still sore in hurting but I have been walking around on my big. One week after having endoscopic surgery right   Review of Systems     Objective:   Physical Exam Neurovascular status intact with wound edges coapted well medial and lateral side and mild edema and negative Homans sign noted    Assessment:     Doing well post endoscopic release medial fascially band right    Plan:     Reapplied sterile dressing and boot dispense Darco shoe which she can just wear at home and reappoint 2 weeks for removal of stitches or earlier if any issues should come out

## 2014-02-04 ENCOUNTER — Encounter: Payer: Self-pay | Admitting: Physical Medicine & Rehabilitation

## 2014-02-04 ENCOUNTER — Encounter: Payer: BC Managed Care – PPO | Attending: Physical Medicine & Rehabilitation

## 2014-02-04 ENCOUNTER — Ambulatory Visit (HOSPITAL_BASED_OUTPATIENT_CLINIC_OR_DEPARTMENT_OTHER): Payer: BC Managed Care – PPO | Admitting: Physical Medicine & Rehabilitation

## 2014-02-04 VITALS — BP 130/67 | HR 96 | Resp 14 | Wt 332.4 lb

## 2014-02-04 DIAGNOSIS — M5412 Radiculopathy, cervical region: Secondary | ICD-10-CM

## 2014-02-04 DIAGNOSIS — M961 Postlaminectomy syndrome, not elsewhere classified: Secondary | ICD-10-CM

## 2014-02-04 MED ORDER — GABAPENTIN 400 MG PO CAPS: 400.0000 mg | ORAL_CAPSULE | Freq: Four times a day (QID) | ORAL | Status: DC

## 2014-02-04 MED ORDER — DICLOFENAC SODIUM 1 % TD GEL: 2.0000 g | Freq: Four times a day (QID) | TRANSDERMAL | Status: DC

## 2014-02-04 MED ORDER — TIZANIDINE HCL 4 MG PO TABS: 4.0000 mg | ORAL_TABLET | Freq: Four times a day (QID) | ORAL | Status: DC | PRN

## 2014-02-04 MED ORDER — TRAMADOL-ACETAMINOPHEN 37.5-325 MG PO TABS: 1.0000 | ORAL_TABLET | Freq: Three times a day (TID) | ORAL | Status: DC | PRN

## 2014-02-04 NOTE — Patient Instructions (Addendum)
Hold the tramadol dose if you are taking the Vicodin  Call us if you wish to switch back to ketoprofen

## 2014-02-04 NOTE — Progress Notes (Signed)
Subjective:    Patient ID: Sabrina Williams, female    DOB: 08-Oct-1970, 42 y.o.   MRN: [id number]  HPI Plantar fascitomy at surgical center in [place] on Jan 15, 2014 Postoperatively prescribed hydrocodone. Patient states that foot pain is greater than shoulder neck and arm pain at the current time.  Oral surgery July 7th, had post op hydrocodone, with several steps to go for process of implants Pain Inventory Average Pain 3 Pain Right Now 5 My pain is constant  In the last 24 hours, has pain interfered with the following? General activity 10 Relation with others 10 Enjoyment of life 10 What TIME of day is your pain at its worst? evening Sleep (in general) Good  Pain is worse with: walking and some activites Pain improves with: rest and medication Relief from Meds: no reply  Mobility walk without assistance ability to climb steps?  yes do you drive?  yes  Function not employed: date last employed . I need assistance with the following:  meal prep, household duties and shopping  Neuro/Psych numbness  Prior Studies Any changes since last visit?  yes  Foot surgery  Physicians involved in your care Any changes since last visit?  no   Family History  Problem Relation Age of Onset  . Cancer Mother     multiple myeloma  . Diabetes Father   . Kidney disease Father   . Heart disease Father    History   Social History  . Marital Status: Single    Spouse Name: N/A    Number of Children: N/A  . Years of Education: N/A   Social History Main Topics  . Smoking status: Never Smoker   . Smokeless tobacco: Never Used  . Alcohol Use: No  . Drug Use: No  . Sexual Activity: None   Other Topics Concern  . None   Social History Narrative  . None   Past Surgical History  Procedure Laterality Date  . Small intestine surgery      cervical laminectomy  . Utereroscopy  1995  . Lithotripsy      multiple  . Shoulder surgery  6/06  . Cesarean section      x2    . Endoscopic plantar fasciotomy Right August 2015  . Dental implant  July 2015   Past Medical History  Diagnosis Date  . Neuromuscular disorder   . Diabetes mellitus   . Depression   . Hypertension   . Chronic kidney disease     stones   BP 130/67  Pulse 96  Resp 14  Wt 332 lb 6.4 oz (150.776 kg)  SpO2 98%  Opioid Risk Score:   Fall Risk Score: Moderate Fall Risk (6-13 points) (previously educated and given handout 08/06/13)  Review of Systems  Endocrine:       Blood sugar fluctuations  Neurological: Positive for numbness.  All other systems reviewed and are negative.      Objective:   Physical Exam Decreased sensation left C5, C6, 7 to light touch Motor strength 5/5 bilateral deltoid, bicep, tricep Cervical range of motion 100% flexion-extension lateral bending and rotation Deep tendon reflexes 2+ bilateral biceps triceps brachioradialis No tenderness to palpation in the cervical paraspinal muscles or peri-scapular muscles.    Assessment & Plan:  1. Chronic cervical radiculitis continue gabapentin 413m QID  2. Plantar fasciitis follow up podiatry 3. Cervical postlaminectomy syndrome patient would like to increase on Zoloft secondary to increased anxiety symptoms. Because of this we will  reduce tramadol to 37.5 mg 4 times per day. Discussed the form. Using ketoprofen gel, will try diclofenac gel in its place

## 2014-02-05 ENCOUNTER — Ambulatory Visit (INDEPENDENT_AMBULATORY_CARE_PROVIDER_SITE_OTHER): Payer: BC Managed Care – PPO | Admitting: Podiatry

## 2014-02-05 VITALS — BP 135/87 | HR 112 | Resp 16

## 2014-02-05 DIAGNOSIS — M722 Plantar fascial fibromatosis: Secondary | ICD-10-CM

## 2014-02-05 MED ORDER — OXYCODONE-ACETAMINOPHEN 10-325 MG PO TABS
1.0000 | ORAL_TABLET | Freq: Three times a day (TID) | ORAL | Status: DC | PRN
Start: 2014-02-05 — End: 2014-05-15

## 2014-02-05 NOTE — Progress Notes (Signed)
Subjective:     Patient ID: Sabrina Williams, female   DOB: 06-Apr-1971, 43 y.o.   MRN: [id number]  HPI patient states that she still having quite a bit of discomfort in her right foot and she is concerned because pain medicine doesn't seem to be giving her relief   Review of Systems     Objective:   Physical Exam Neurovascular status intact negative Homans sign noted with well-healing surgical sites on the right medial and lateral heel. Moderate discomfort in the plantar heel is palpated    Assessment:     Endoscopic surgery right with inflammation and pain and obesity is complicating factor along with inability to do well with pain medicine as she had to take quite a bit because of chronic neck pain    Plan:     Instructed on wearing her boot and the importance of doing this and placed her on Percocet to try to help to control the pain she is experiencing. Reappoint to recheck again in 2 weeks

## 2014-02-19 ENCOUNTER — Ambulatory Visit (INDEPENDENT_AMBULATORY_CARE_PROVIDER_SITE_OTHER): Payer: BC Managed Care – PPO | Admitting: Podiatry

## 2014-02-19 VITALS — BP 117/82 | HR 114 | Resp 16

## 2014-02-19 DIAGNOSIS — M7750 Other enthesopathy of unspecified foot: Secondary | ICD-10-CM

## 2014-02-19 DIAGNOSIS — M722 Plantar fascial fibromatosis: Secondary | ICD-10-CM

## 2014-02-19 MED ORDER — OXYCODONE-ACETAMINOPHEN 10-325 MG PO TABS
1.0000 | ORAL_TABLET | Freq: Three times a day (TID) | ORAL | Status: DC | PRN
Start: 2014-02-19 — End: 2014-05-15

## 2014-02-19 NOTE — Progress Notes (Signed)
Subjective:     Patient ID: Sabrina Williams, female   DOB: [birth year], 43 y.o.   MRN: [id number]  HPI patient states that she is not having as much pain when she walks but she's getting quite a bit of pain when she stands on her right heel. States that it has been much better since the stitches are removed   Review of Systems     Objective:   Physical Exam Neurovascular status unchanged with negative Homans sign noted and well-healed surgical sites on the side of the right and left heel. Mild discomfort in the arch but no increased erythema edema or other indications of pathology    Assessment:     Improving from endoscopic release of the medial fascially band right with pain still present especially with prolonged standing    Plan:     Reviewed continuation of stretching exercises and ice therapy. She does still have quite a bit of discomfort at times been giving her 1 more prescription for Percocet 10/325 to be taken sparingly and she is to be seen back in 1 month earlier if any issues should occur

## 2014-03-01 ENCOUNTER — Other Ambulatory Visit: Payer: Self-pay | Admitting: *Deleted

## 2014-03-01 MED ORDER — OXYCODONE-ACETAMINOPHEN 5-325 MG PO TABS: 1.0000 | ORAL_TABLET | Freq: Three times a day (TID) | ORAL | Status: DC | PRN

## 2014-03-01 NOTE — Telephone Encounter (Signed)
Patient called request refill on percocet , ok per dr Paulla Dolly

## 2014-03-06 ENCOUNTER — Encounter: Payer: Self-pay | Admitting: Podiatry

## 2014-03-06 ENCOUNTER — Ambulatory Visit (INDEPENDENT_AMBULATORY_CARE_PROVIDER_SITE_OTHER): Payer: BC Managed Care – PPO | Admitting: Podiatry

## 2014-03-06 ENCOUNTER — Ambulatory Visit (INDEPENDENT_AMBULATORY_CARE_PROVIDER_SITE_OTHER): Payer: BC Managed Care – PPO

## 2014-03-06 VITALS — BP 133/99 | HR 139 | Resp 16

## 2014-03-06 DIAGNOSIS — M779 Enthesopathy, unspecified: Secondary | ICD-10-CM

## 2014-03-06 DIAGNOSIS — M722 Plantar fascial fibromatosis: Secondary | ICD-10-CM

## 2014-03-06 MED ORDER — OXYCODONE-ACETAMINOPHEN 10-325 MG PO TABS: 1.0000 | ORAL_TABLET | Freq: Three times a day (TID) | ORAL | Status: DC | PRN

## 2014-03-07 NOTE — Progress Notes (Signed)
Subjective:     Patient ID: Sabrina Williams, female   DOB: 1971-01-11, 43 y.o.   MRN: [id number]  HPI patient states I wanted to be checked because I think I making progress and my heel and Achilles filled much better but I'm getting pain in my arch with some burning discomfort   Review of Systems     Objective:   Physical Exam Neurovascular status intact with approximate 6 week history of endoscopic surgery right plantar heel with no pain in the heel no pain in the Achilles and moderate discomfort when the arch is palpated. Patient is obese which is certainly a complicating factor for long-term reduction of pain    Assessment:     Reviewed condition and discussed the discomfort that she is still experiencing that the fact that a very good sign that her heel and Achilles are better    Plan:     Applied fascial strapping to the foot instructed on using these at home and we discussed possibly increasing her gabapentin at night to see whether or not this would help reduce the burning she is experiencing. She is currently taking 6000 mg a day and we may try to increase that to see help her

## 2014-03-15 ENCOUNTER — Encounter: Payer: BC Managed Care – PPO | Admitting: Podiatry

## 2014-03-22 ENCOUNTER — Telehealth: Payer: Self-pay | Admitting: *Deleted

## 2014-03-22 ENCOUNTER — Other Ambulatory Visit: Payer: Self-pay | Admitting: Podiatry

## 2014-03-22 MED ORDER — OXYCODONE-ACETAMINOPHEN 5-325 MG PO TABS: 1.0000 | ORAL_TABLET | Freq: Three times a day (TID) | ORAL | Status: DC | PRN

## 2014-03-22 NOTE — Telephone Encounter (Signed)
Patient called asking for another refill on her percocet , I explained to her that dr Paulla Dolly was not here, no other doctor in our office today, but she is willing to come to [place] to pick up prescription. If somone can refill this for her , she is going out of town.  She is a surgery patient of dr Paulla Dolly

## 2014-03-22 NOTE — Telephone Encounter (Signed)
I have printed it and she can come to the office to pick it up. If you could please call the patient. Thanks. If she needs to be seen today, let me know. Thanks.

## 2014-04-05 ENCOUNTER — Ambulatory Visit (INDEPENDENT_AMBULATORY_CARE_PROVIDER_SITE_OTHER): Payer: BC Managed Care – PPO | Admitting: Podiatry

## 2014-04-05 VITALS — BP 132/89 | HR 122 | Resp 16

## 2014-04-05 DIAGNOSIS — M722 Plantar fascial fibromatosis: Secondary | ICD-10-CM

## 2014-04-05 DIAGNOSIS — M779 Enthesopathy, unspecified: Secondary | ICD-10-CM

## 2014-04-05 MED ORDER — TRIAMCINOLONE ACETONIDE 10 MG/ML IJ SUSP
10.0000 mg | Freq: Once | INTRAMUSCULAR | Status: AC
  Administered 2014-04-05: 10 mg

## 2014-04-05 MED ORDER — OXYCODONE-ACETAMINOPHEN 10-325 MG PO TABS: 1.0000 | ORAL_TABLET | Freq: Three times a day (TID) | ORAL | Status: DC | PRN

## 2014-04-07 NOTE — Progress Notes (Signed)
Subjective:     Patient ID: Sabrina Williams, female   DOB: 1971/05/03, 43 y.o.   MRN: [id number]  HPIpatient points to the right foot stating that I think the heel is gradually getting better but I still having quite a bit of arch pain and I still have trouble doing any form of ambulation and I get a lot of throbbing at night or during the day if I been on it too long. I do feel improvement is coming   Review of Systems     Objective:   Physical Exam Neurovascular status was found to be unchanged with incision sites on each heel side medial lateral that are healing well and diminished discomfort in the heel and arch. There is a mild amount of discomfort more around the distal insertion of the posterior tibial tendon which may be compensatory in nature and hopefully is a part of what she is experiencing at this time    Assessment:     Possible mild posterior tibial tendinitis compensatory in nature with continued symptoms that are gradually getting better secondary to endoscopic release of the fascially band    Plan:     I did a very careful injection at the posterior tibial insertion with 3 mg dexamethasone Kenalog and before doing this did discuss risk of rupture associated with injection and advised on reduced activity and utilization of fascially strappings which I dispensed to the patient

## 2014-04-26 ENCOUNTER — Telehealth: Payer: Self-pay | Admitting: *Deleted

## 2014-04-26 MED ORDER — OXYCODONE-ACETAMINOPHEN 10-325 MG PO TABS: 1.0000 | ORAL_TABLET | Freq: Three times a day (TID) | ORAL | Status: DC | PRN

## 2014-04-26 NOTE — Telephone Encounter (Signed)
Would like a refill on the percocet

## 2014-05-08 ENCOUNTER — Ambulatory Visit (INDEPENDENT_AMBULATORY_CARE_PROVIDER_SITE_OTHER): Payer: BC Managed Care – PPO | Admitting: Podiatry

## 2014-05-08 ENCOUNTER — Encounter: Payer: Self-pay | Admitting: Podiatry

## 2014-05-08 ENCOUNTER — Telehealth: Payer: Self-pay | Admitting: *Deleted

## 2014-05-08 VITALS — BP 125/82 | HR 107 | Resp 16

## 2014-05-08 DIAGNOSIS — R52 Pain, unspecified: Secondary | ICD-10-CM

## 2014-05-08 DIAGNOSIS — M779 Enthesopathy, unspecified: Secondary | ICD-10-CM

## 2014-05-08 MED ORDER — TRIAMCINOLONE ACETONIDE 10 MG/ML IJ SUSP
10.0000 mg | Freq: Once | INTRAMUSCULAR | Status: AC
  Administered 2014-05-08: 10 mg

## 2014-05-08 NOTE — Telephone Encounter (Signed)
Printed all of patient's notes from [place] (patient was a Public relations account executive patient). Will have at the front desk for her to pick up. She will just need to complete and sign a medical records release form.

## 2014-05-08 NOTE — Progress Notes (Signed)
Subjective:     Patient ID: Sabrina Williams, female   DOB: 1971-04-10, 43 y.o.   MRN: [id number]  HPI patient states I'm getting pain around my ankle area right mild discomfort at times and the arch right and pain in the back of my heel right. The area that was operated on is doing well with no pain   Review of Systems     Objective:   Physical Exam Neurovascular status unchanged with obese female who certainly is putting extra stress on her foot secondary to her body structure. The heel itself is doing very well with the incisions healing well and no pain when I palpated the fascially band with mild discomfort in the arch and moderate discomfort in the posterior tibial insertion right and mild Achilles tendinitis right    Assessment:     Patient's doing very well from her surgery but is having pain in numerous other areas which may be related to her back issues and possibly obesity related complications.    Plan:     I'm going to try again to work on posterior tib and I did a very careful injection at the insertion 3 mg dexamethasone Kenalog and applied fascial brace to try to support the arch. I want her to see her pain management doctor to see whether they can control her pain as at this point we have written her for quite a bit of pain medicine which is no longer affective in managing her condition. She will go back and see him and if there's any information he needs we will for her but as far as I'm concerned she has recovered well from the endoscopic surgery and our initial goal of eliminating the plantar fasciitis has been realized with unfortunate pains in other parts of her foot and ankle

## 2014-05-08 NOTE — Telephone Encounter (Signed)
Patient called regarding pain management and still being in pain.

## 2014-05-08 NOTE — Telephone Encounter (Signed)
Returned call-LM for her to call with questions.

## 2014-05-08 NOTE — Telephone Encounter (Signed)
Patient returned call-patient was referred to pain management and needs her records from our office. She would like to have them ready by 11:30am to pickup tomorrow. Her appointment is tomorrow at 12:30.

## 2014-05-09 ENCOUNTER — Encounter: Payer: Self-pay | Admitting: Physical Medicine & Rehabilitation

## 2014-05-09 ENCOUNTER — Encounter: Payer: BC Managed Care – PPO | Attending: Physical Medicine & Rehabilitation

## 2014-05-09 ENCOUNTER — Other Ambulatory Visit: Payer: Self-pay | Admitting: Physical Medicine & Rehabilitation

## 2014-05-09 ENCOUNTER — Ambulatory Visit (HOSPITAL_BASED_OUTPATIENT_CLINIC_OR_DEPARTMENT_OTHER): Payer: BC Managed Care – PPO | Admitting: Physical Medicine & Rehabilitation

## 2014-05-09 VITALS — BP 131/70 | HR 105 | Resp 14

## 2014-05-09 DIAGNOSIS — M7918 Myalgia, other site: Secondary | ICD-10-CM

## 2014-05-09 DIAGNOSIS — M797 Fibromyalgia: Secondary | ICD-10-CM

## 2014-05-09 DIAGNOSIS — G90521 Complex regional pain syndrome I of right lower limb: Secondary | ICD-10-CM

## 2014-05-09 DIAGNOSIS — M5412 Radiculopathy, cervical region: Secondary | ICD-10-CM | POA: Insufficient documentation

## 2014-05-09 DIAGNOSIS — Z5181 Encounter for therapeutic drug level monitoring: Secondary | ICD-10-CM | POA: Diagnosis not present

## 2014-05-09 DIAGNOSIS — M961 Postlaminectomy syndrome, not elsewhere classified: Secondary | ICD-10-CM

## 2014-05-09 DIAGNOSIS — Z79899 Other long term (current) drug therapy: Secondary | ICD-10-CM

## 2014-05-09 MED ORDER — PREDNISONE (PAK) 10 MG PO TABS: ORAL_TABLET | Freq: Every day | ORAL | Status: DC

## 2014-05-09 MED ORDER — OXYCODONE HCL 10 MG PO TABS: 10.0000 mg | ORAL_TABLET | Freq: Three times a day (TID) | ORAL | Status: DC

## 2014-05-09 NOTE — Patient Instructions (Signed)
Complex Regional Pain Syndrome Complex Regional Pain Syndrome (CRPS) is a nerve disorder that occurs at the site of an injury. It occurs especially after injuries from high-velocity impacts such as those from bullets or shrapnel. However, it may occur without apparent injury. The arms or legs are usually involved. SYMPTOMS  CRPS is a chronic condition characterized by:  Severe burning pain.  Changes in bone and skin.  Excessive sweating.  Tissue swelling.  Extreme sensitivity to touch. One visible sign of CRPS near the site of injury is warm, shiny, red skin that later becomes cool and bluish. The pain that patients report is out of proportion to the severity of the injury. The pain gets worse, rather than better, over time. Eventually the joints become stiff from disuse and the skin, muscles, and bone atrophy. The symptoms of CRPS vary in severity and duration. The cause of CRPS is unknown. The disorder is unique in that it simultaneously affects the:  Nerves.  Skin.  Muscles.  Blood vessels.  Bones. CRPS can strike at any age but is more common between the ages of 40 and 60. However, the number of CRPS cases among adolescents and young adults is increasing. CRPS is diagnosed primarily through observation of the symptoms. Some physicians use thermography to detect changes in body temperature that are common in CRPS. X-rays may also show changes in the bone.  TREATMENT  Treatments include:  Physicians use a variety of drugs to treat CRPS.  Elevation of the extremity and physical therapy are also used to treat CRPS.  Injection of a local anesthetic.  Applying brief pulses of electricity to nerve endings under the skin (transcutaneous electrical stimulation or TENS).  In some cases, interruption of the affected portion of the sympathetic nervous system (surgical or chemical sympathectomy) is necessary to relieve pain. This involves cutting the nerve or nerves. Pain is destroyed  almost instantly, but this treatment may also destroy other sensations. PROGNOSIS Good progress can be made in treating CRPS if treatment is begun early. Ideally treatment should begin within 3 months of the first symptoms. Early treatment often results in remission. If treatment is delayed, the disorder can quickly spread to the entire limb, and changes in bone and muscle may become irreversible. In 50 percent of CRPS cases, pain persists longer than 6 months and sometimes for years. Document Released: 04/30/2002 Document Revised: 08/02/2011 Document Reviewed: 03/20/2008 [employer] Patient Information 2015 [employer]. This information is not intended to replace advice given to you by your health care provider. Make sure you discuss any questions you have with your health care provider.  

## 2014-05-09 NOTE — Progress Notes (Signed)
Subjective:    Patient ID: Sabrina Williams, female    DOB: 1970/09/11, 43 y.o.   MRN: [id number]  HPI Patient returns she is very upset and has numerous issues.  Her chief concern is her right foot. She underwent plantar fascial release 01/15/2014. She's had an increase of pain postoperatively not only on the bottom of her foot but on the top of her foot and sometimes involving the ankle and radiating to the leg. She's had no trauma to the area. She notes some redness of the foot a little bit of swelling. Swelling or tenderness.  She occasionally gets some numbness and tingling in the left foot but not as severe as on the right foot.  She has returned to her podiatristYesterday. He did a medial ankle block. She had a ankle splint that was applied and this was initially helpful. Because of swelling she took the ankle splint off and had increased pain after that.  She has been driving back and forth to [employer]. She notes increasing pain and left shoulder. She would like to have a trigger point injection today.  Pain Inventory Average Pain 6 Pain Right Now 4 My pain is constant, sharp, burning, stabbing and aching  In the last 24 hours, has pain interfered with the following? General activity 8 Relation with others 5 Enjoyment of life 7 What TIME of day is your pain at its worst? evening, night Sleep (in general) Fair  Pain is worse with: walking and standing Pain improves with: rest, medication and injections Relief from Meds: 7  Mobility walk without assistance how many minutes can you walk? 20 ability to climb steps?  yes do you drive?  yes Do you have any goals in this area?  yes  Function not employed: date last employed . I need assistance with the following:  meal prep, household duties and shopping Do you have any goals in this area?  no  Neuro/Psych trouble walking  Prior Studies Any changes since last visit?  no  Physicians involved in your care Any  changes since last visit?  no   Family History  Problem Relation Age of Onset  . Cancer Mother     multiple myeloma  . Diabetes Father   . Kidney disease Father   . Heart disease Father    History   Social History  . Marital Status: Married    Spouse Name: N/A    Number of Children: N/A  . Years of Education: N/A   Social History Main Topics  . Smoking status: Never Smoker   . Smokeless tobacco: Never Used  . Alcohol Use: No  . Drug Use: No  . Sexual Activity: None   Other Topics Concern  . None   Social History Narrative   Past Surgical History  Procedure Laterality Date  . Small intestine surgery      cervical laminectomy  . Utereroscopy  1995  . Lithotripsy      multiple  . Shoulder surgery  6/06  . Cesarean section      x2  . Endoscopic plantar fasciotomy Right August 2015  . Dental implant  July 2015   Past Medical History  Diagnosis Date  . Neuromuscular disorder   . Diabetes mellitus   . Depression   . Hypertension   . Chronic kidney disease     stones   BP 131/70 mmHg  Pulse 105  Resp 14  SpO2 97%  Opioid Risk Score:   Fall Risk  Score:  2 Review of Systems  Musculoskeletal: Positive for gait problem.  All other systems reviewed and are negative.      Objective:   Physical Exam Tenderness to palpation left upper trapezius in 2 areas She has full left upper tremor E range of motion normal strength. Neck range of motion is mildly reduced not causing pain radiating into the shoulder or the upper trapezius area  Right foot hypersensitivity to touch on the dorsum aspect of the foot as well as the plantar aspect of the foot. No erythema or swelling along the incision site. There is mild erythema on the dorsum of the foot but no swelling. She has good range of motion in the toes ankle foot and knee area. Negative Thompson's test bilaterally No tenderness over the Achilles. No pain with range of motion of the right foot and ankle area Good  pedal pulse as well as posterior tibial pulse bilaterally Right foot is warmer than left foot        Assessment & Plan:  1. Complex regional pain syndrome right lower extremity postoperative. She is 4 months post plantar fascial release. She has hypersensitivity touch as well as erythema. She has diffuse numbness and tingling outside the distribution of a single nerve. Prednisone $RemoveBeforeDEI'10mg'[id number]$  Take 4 tablets once a day for 3 days Take 3 tablets once a day for 3 days Take 2 tablets once a day for 3 days Take 1 tablet once a day for 3 days Then discontinue  Will refer to physical therapy. Increase nighttime dose of gabapentin to 2 tablets  I have written a prescription for Oxycodone 10 mg 1 by mouth 3 times a day when necessary #90 check urine drug screen today. As I discussed with patient I don't think this is a good long-term medication for this condition or in general. Will reevaluate after one month. Would then try to reduce Zoloft to 25 mg a day and increase tramadol to 100 mg 3 times a day  May need referral for lumbar sympathetic injection at that time if she is not doing any better  2. Myofascial pain syndrome this is chronic exacerbated by frequent driving. We will do trigger point injections today  Trigger Point Injection  Indication: Left trapezius Myofascial pain not relieved by medication management and other conservative care.  Informed consent was obtained after describing risk and benefits of the procedure with the patient, this includes bleeding, bruising, infection and medication side effects.  The patient wishes to proceed and has given written consent.  The patient was placed in a Sitting position.  The Left trapezius area was marked and prepped with Betadine.  It was entered with a 25-gauge 1-1/2 inch needle and 1 mL of 1% lidocaine was injected into each of 2 trigger points, after negative draw back for blood.  The patient tolerated the procedure well.  Post procedure  instructions were given.

## 2014-05-10 LAB — PMP ALCOHOL METABOLITE (ETG): Ethyl Glucuronide (EtG): NEGATIVE ng/mL

## 2014-05-13 LAB — OXYCODONE, URINE (LC/MS-MS)
Noroxycodone, Ur: NEGATIVE ng/mL — AB (ref <50)
Oxycodone, ur: NEGATIVE ng/mL — AB (ref <50)
Oxymorphone: 102 ng/mL (ref <50)

## 2014-05-13 LAB — OPIATES/OPIOIDS (LC/MS-MS)
Codeine Urine: NEGATIVE ng/mL (ref <50)
Hydrocodone: NEGATIVE ng/mL (ref <50)
Hydromorphone: NEGATIVE ng/mL (ref <50)
Morphine Urine: NEGATIVE ng/mL (ref <50)
Norhydrocodone, Ur: NEGATIVE ng/mL (ref <50)
Noroxycodone, Ur: NEGATIVE ng/mL — AB (ref <50)
Oxycodone, ur: NEGATIVE ng/mL — AB (ref <50)
Oxymorphone: 102 ng/mL (ref <50)

## 2014-05-13 LAB — TRAMADOL, URINE
N-DESMETHYL-CIS-TRAMADOL: 3878 ng/mL — AB (ref <100)
Tramadol, Urine: 18369 ng/mL — AB (ref <100)

## 2014-05-14 ENCOUNTER — Telehealth: Payer: Self-pay | Admitting: *Deleted

## 2014-05-14 LAB — PRESCRIPTION MONITORING PROFILE (SOLSTAS)
Amphetamine/Meth: NEGATIVE ng/mL
Barbiturate Screen, Urine: NEGATIVE ng/mL
Benzodiazepine Screen, Urine: NEGATIVE ng/mL
Buprenorphine, Urine: NEGATIVE ng/mL
Cannabinoid Scrn, Ur: NEGATIVE ng/mL
Carisoprodol, Urine: NEGATIVE ng/mL
Cocaine Metabolites: NEGATIVE ng/mL
Creatinine, Urine: 175.93 mg/dL (ref >20.0)
Fentanyl, Ur: NEGATIVE ng/mL
MDMA URINE: NEGATIVE ng/mL
Meperidine, Ur: NEGATIVE ng/mL
Methadone Screen, Urine: NEGATIVE ng/mL
Nitrites, Initial: NEGATIVE ug/mL
Propoxyphene: NEGATIVE ng/mL
Tapentadol, urine: NEGATIVE ng/mL
Zolpidem, Urine: NEGATIVE ng/mL
pH, Initial: 6.3 pH (ref 4.5–8.9)

## 2014-05-14 NOTE — Telephone Encounter (Signed)
Pt having trouble with nausea, wondering if it is related to prednisone, pt asking for an Rx for nausea medication

## 2014-05-14 NOTE — Telephone Encounter (Signed)
Zofran 4mg  po BID prn#14, no RF

## 2014-05-15 MED ORDER — ONDANSETRON HCL 4 MG PO TABS: 4.0000 mg | ORAL_TABLET | Freq: Two times a day (BID) | ORAL | Status: DC | PRN

## 2014-05-15 NOTE — Telephone Encounter (Signed)
Left message with husband that we sent something to pharmacy for nausea.

## 2014-05-29 NOTE — Progress Notes (Signed)
Urine drug screen for this encounter is consistent for prescribed medication 

## 2014-06-10 ENCOUNTER — Encounter: Payer: Self-pay | Admitting: Registered Nurse

## 2014-06-10 ENCOUNTER — Ambulatory Visit: Payer: BC Managed Care – PPO | Admitting: Physical Medicine & Rehabilitation

## 2014-06-10 ENCOUNTER — Encounter: Payer: BLUE CROSS/BLUE SHIELD | Attending: Physical Medicine & Rehabilitation | Admitting: Registered Nurse

## 2014-06-10 VITALS — BP 136/74 | HR 96 | Resp 16

## 2014-06-10 DIAGNOSIS — G90521 Complex regional pain syndrome I of right lower limb: Secondary | ICD-10-CM | POA: Insufficient documentation

## 2014-06-10 DIAGNOSIS — M5412 Radiculopathy, cervical region: Secondary | ICD-10-CM | POA: Diagnosis not present

## 2014-06-10 DIAGNOSIS — M797 Fibromyalgia: Secondary | ICD-10-CM | POA: Diagnosis not present

## 2014-06-10 DIAGNOSIS — M961 Postlaminectomy syndrome, not elsewhere classified: Secondary | ICD-10-CM | POA: Insufficient documentation

## 2014-06-10 DIAGNOSIS — Z5181 Encounter for therapeutic drug level monitoring: Secondary | ICD-10-CM | POA: Insufficient documentation

## 2014-06-10 DIAGNOSIS — Z79899 Other long term (current) drug therapy: Secondary | ICD-10-CM

## 2014-06-10 MED ORDER — OXYCODONE HCL 10 MG PO TABS: 10.0000 mg | ORAL_TABLET | Freq: Three times a day (TID) | ORAL | Status: DC

## 2014-06-10 NOTE — Progress Notes (Signed)
Subjective:    Patient ID: Sabrina Williams, female    DOB: January 11, 1971, 44 y.o.   MRN: [id number]  HPI: Sabrina Williams is a 44 year old female who returns for follow up for chronic pain and medication refill. She says her pain is located in her right foot. She rates her pain 5. Her current exercise regime  foot exercises and using bands.  She states " after she  Takes her oxycodone she has been experiencing nausea, abdominal pain, flushing and itching. No rash noted. She was given a prescription for one week of oxycodone instructed to use benadryl prior to dose will speak to Dr. Letta Pate. I will call her in the morning. She verbalizes understanding.  Pain Inventory Average Pain 5 Pain Right Now 5 My pain is intermittent, sharp, burning, stabbing and tingling  In the last 24 hours, has pain interfered with the following? General activity 5 Relation with others 5 Enjoyment of life 5 What TIME of day is your pain at its worst? evening Sleep (in general) Fair  Pain is worse with: walking, standing and some activites Pain improves with: rest and medication Relief from Meds: 5  Mobility walk without assistance ability to climb steps?  yes do you drive?  yes  Function disabled: date disabled .  Neuro/Psych tingling  Prior Studies Any changes since last visit?  no  Physicians involved in your care Any changes since last visit?  no   Family History  Problem Relation Age of Onset  . Cancer Mother     multiple myeloma  . Diabetes Father   . Kidney disease Father   . Heart disease Father    History   Social History  . Marital Status: Married    Spouse Name: N/A    Number of Children: N/A  . Years of Education: N/A   Social History Main Topics  . Smoking status: Never Smoker   . Smokeless tobacco: Never Used  . Alcohol Use: No  . Drug Use: No  . Sexual Activity: None   Other Topics Concern  . None   Social History Narrative   Past Surgical History    Procedure Laterality Date  . Small intestine surgery      cervical laminectomy  . Utereroscopy  1995  . Lithotripsy      multiple  . Shoulder surgery  6/06  . Cesarean section      x2  . Endoscopic plantar fasciotomy Right August 2015  . Dental implant  July 2015   Past Medical History  Diagnosis Date  . Neuromuscular disorder   . Diabetes mellitus   . Depression   . Hypertension   . Chronic kidney disease     stones   BP 136/74 mmHg  Pulse 96  Resp 16  SpO2 97%  Opioid Risk Score:   Fall Risk Score: Low Fall Risk (0-5 points)  Review of Systems  Constitutional: Negative.   HENT: Negative.   Eyes: Negative.   Respiratory: Negative.   Cardiovascular: Negative.   Gastrointestinal: Negative.   Endocrine: Negative.   Genitourinary: Positive for decreased urine volume.  Musculoskeletal:       Neuropathy  Skin: Negative.   Allergic/Immunologic: Negative.   Neurological:       Foot pain  Hematological: Negative.   Psychiatric/Behavioral: Negative.        Objective:   Physical Exam  Constitutional: She is oriented to person, place, and time. She appears well-developed and well-nourished.  HENT:  Head: Normocephalic and atraumatic.  Neck: Normal range of motion. Neck supple.  Cardiovascular: Normal rate and regular rhythm.   Pulmonary/Chest: Effort normal and breath sounds normal.  Musculoskeletal:  Normal Muscle Bulk and Muscle Testing Reveals: Upper Extremities: Full ROM and Muscle strength 5/5 Lower Extremities:  Full ROM and  Muscle Strength 5/5 Right foot  sensitivity at dorsum aspect of foot/ Warmth with palpation noted. Arises from chair with ease Narrow Based Gait  Neurological: She is alert and oriented to person, place, and time.  Skin: Skin is warm and dry.  Psychiatric: She has a normal mood and affect.  Nursing note and vitals reviewed.         Assessment & Plan:  1. Complex regional pain syndrome right lower extremity postoperative.  She's s/p post plantar fascial release. She has sensitivity touch. She has diffuse numbness and tingling. Refilled: Oxycodone 10 mg one tablet three times a day. #90. Prednisone round as previously ordered. 2.Myofascial pain syndrome:  Continue with voltaren gel, heat and exercise regime.  30 minutes of face to face patient care time was spent during this visit. All questions were encouraged and answered.  F/U in 1 month

## 2014-06-11 ENCOUNTER — Telehealth: Payer: Self-pay | Admitting: Registered Nurse

## 2014-06-11 MED ORDER — TAPENTADOL HCL 50 MG PO TABS: 50.0000 mg | ORAL_TABLET | Freq: Two times a day (BID) | ORAL | Status: DC

## 2014-06-11 MED ORDER — TAPENTADOL HCL 50 MG PO TABS: 50.0000 mg | ORAL_TABLET | Freq: Four times a day (QID) | ORAL | Status: DC | PRN

## 2014-06-11 NOTE — Telephone Encounter (Signed)
Nucynta dose is 50 mg QID,  Nucynta script 50 mg every 12 hours was discarded . Sabrina Williams was called and informed

## 2014-06-11 NOTE — Telephone Encounter (Signed)
Placed a call, asking for her son's name and to informed her to make sure he has identification. Left a message

## 2014-06-11 NOTE — Telephone Encounter (Signed)
Spoke with Dr. Letta Pate regarding the side effects to oxycodone. We will change her from oxycodone to Nucynta. Sabrina Williams is in agreement and verbalizes understanding. Her dose will be Nucynta 50 mg every 12 hours. Her son will return the oxycodone and medication will be discarded and the Nucynta script will be given.

## 2014-06-12 ENCOUNTER — Telehealth: Payer: Self-pay | Admitting: Registered Nurse

## 2014-06-12 NOTE — Telephone Encounter (Signed)
Sabrina Williams called today, Her son Sabrina Williams will be picking up the prescription for his mother Sabrina Williams. Nucynta. He will be returning the oxycodone. Was instructed to have identification, she verbalizes understanding.

## 2014-06-17 ENCOUNTER — Telehealth: Payer: Self-pay | Admitting: *Deleted

## 2014-06-17 NOTE — Telephone Encounter (Signed)
Pain med recently changed to nucynta....patient says it is not helping as much as she had hoped and is asking if there are any adjustments that can be made

## 2014-06-17 NOTE — Telephone Encounter (Signed)
Patient can try either one tablet Nucynta 50 mg every 4 hours Or Nucynta 50 mg 2 tablets every 8 hours

## 2014-06-18 NOTE — Telephone Encounter (Signed)
Spoke with patient - she is to start taking the Nucynta 50 mg QID instead of q6h.  Patient acknowledged change and stated that she should have enough medication to make it to her appt on 07/05/14, if not we will ask the doctor for a bridge.

## 2014-07-05 ENCOUNTER — Ambulatory Visit (HOSPITAL_BASED_OUTPATIENT_CLINIC_OR_DEPARTMENT_OTHER): Payer: BLUE CROSS/BLUE SHIELD | Admitting: Physical Medicine & Rehabilitation

## 2014-07-05 ENCOUNTER — Encounter: Payer: BLUE CROSS/BLUE SHIELD | Attending: Physical Medicine & Rehabilitation

## 2014-07-05 ENCOUNTER — Encounter: Payer: Self-pay | Admitting: Physical Medicine & Rehabilitation

## 2014-07-05 VITALS — BP 157/94 | HR 95 | Resp 14

## 2014-07-05 DIAGNOSIS — G90521 Complex regional pain syndrome I of right lower limb: Secondary | ICD-10-CM

## 2014-07-05 DIAGNOSIS — Z5181 Encounter for therapeutic drug level monitoring: Secondary | ICD-10-CM | POA: Insufficient documentation

## 2014-07-05 DIAGNOSIS — M797 Fibromyalgia: Secondary | ICD-10-CM | POA: Insufficient documentation

## 2014-07-05 DIAGNOSIS — M961 Postlaminectomy syndrome, not elsewhere classified: Secondary | ICD-10-CM | POA: Diagnosis present

## 2014-07-05 DIAGNOSIS — M5412 Radiculopathy, cervical region: Secondary | ICD-10-CM | POA: Diagnosis not present

## 2014-07-05 MED ORDER — TAPENTADOL HCL 75 MG PO TABS: 75.0000 mg | ORAL_TABLET | Freq: Every day | ORAL | Status: DC

## 2014-07-05 NOTE — Progress Notes (Signed)
Subjective:    Patient ID: Sabrina Williams, female    DOB: [birth year], 44 y.o.   MRN: [id number]  HPI Chief complaint right foot pain- Right plantar fascia release 01/15/2014- chronic post op pain Diagnosed with CRPS 1 Right foot 05/09/14 Stretches at PT helpful  Prednisone Dosepaks 2, both were helpful, more helpful than the first round   Gabapentin 400 mg 1 at breakfast, 2 at lunch, 1 at supper, 2 at bedtime Taking Nucynta 50 mg every 4 hours. This is better than 50 mg every 6 hours. Able to tolerate this medication, better than the oxycodone, not causing as much drowsiness  Patient has gone through physical therapy, high co-pays, is doing home exercise program.   Pain Inventory Average Pain 4 Pain Right Now 4 My pain is constant, sharp, burning, tingling and aching  In the last 24 hours, has pain interfered with the following? General activity 7 Relation with others 3 Enjoyment of life 6 What TIME of day is your pain at its worst? evening, night Sleep (in general) Fair  Pain is worse with: walking and standing Pain improves with: rest, therapy/exercise and medication Relief from Meds: 6  Mobility walk without assistance how many minutes can you walk? 10-15 ability to climb steps?  yes do you drive?  yes Do you have any goals in this area?  yes  Function not employed: date last employed . I need assistance with the following:  meal prep, household duties and shopping Do you have any goals in this area?  no  Neuro/Psych numbness  Prior Studies Any changes since last visit?  no  Physicians involved in your care Any changes since last visit?  no   Family History  Problem Relation Age of Onset  . Cancer Mother     multiple myeloma  . Diabetes Father   . Kidney disease Father   . Heart disease Father    History   Social History  . Marital Status: Married    Spouse Name: N/A  . Number of Children: N/A  . Years of Education: N/A   Social History  Main Topics  . Smoking status: Never Smoker   . Smokeless tobacco: Never Used  . Alcohol Use: No  . Drug Use: No  . Sexual Activity: Not on file   Other Topics Concern  . None   Social History Narrative   Past Surgical History  Procedure Laterality Date  . Small intestine surgery      cervical laminectomy  . Utereroscopy  1995  . Lithotripsy      multiple  . Shoulder surgery  6/06  . Cesarean section      x2  . Endoscopic plantar fasciotomy Right August 2015  . Dental implant  July 2015   Past Medical History  Diagnosis Date  . Neuromuscular disorder   . Diabetes mellitus   . Depression   . Hypertension   . Chronic kidney disease     stones   BP 157/94 mmHg  Pulse 95  Resp 14  SpO2 98%  Opioid Risk Score:   Fall Risk Score: Low Fall Risk (0-5 points) (patient previously educated)  Review of Systems  Neurological: Positive for numbness.  All other systems reviewed and are negative.      Objective:   Physical Exam  Constitutional: She is oriented to person, place, and time. She appears well-developed and well-nourished.  HENT:  Head: Normocephalic and atraumatic.  Eyes: Conjunctivae are normal. Pupils are equal, round, and  reactive to light.  Musculoskeletal:  Right foot mild erythema, hypersensitive over the toes and dorsum of foot, medial ankle  Neurological: She is alert and oriented to person, place, and time.  Psychiatric: She has a normal mood and affect.  Nursing note and vitals reviewed.  Motor strength is 4/5 and right knee extensor and ankle dorsal flexor plantar flexor toe flexors and extensors  Pedal pulse is normal on the right      Assessment & Plan:  1.  Complex regional pain syndrome type 1 RLE post foot surgery- January 15 2014 Patient with good but temporary relief after prednisone Dosepaks Gets partial relief from [place]. Some improvements after increasing from Nucynta 50 mg 4 times a day 2 every 4 hours when necessary Patient  still taking tramadol. I advised that she stops tramadol and we will use a single agent instead. Will raise Nucynta to 75 mg 5 times per day Continue gabapentin total of 2400 mg per day We'll refer for lumbar sympathetic injection with Dr. Maryjean Ka Return to clinic one month

## 2014-07-05 NOTE — Patient Instructions (Signed)
Complex Regional Pain Syndrome Complex Regional Pain Syndrome (CRPS) is a nerve disorder that occurs at the site of an injury. It occurs especially after injuries from high-velocity impacts such as those from bullets or shrapnel. However, it may occur without apparent injury. The arms or legs are usually involved. SYMPTOMS  CRPS is a chronic condition characterized by:  Severe burning pain.  Changes in bone and skin.  Excessive sweating.  Tissue swelling.  Extreme sensitivity to touch. One visible sign of CRPS near the site of injury is warm, shiny, red skin that later becomes cool and bluish. The pain that patients report is out of proportion to the severity of the injury. The pain gets worse, rather than better, over time. Eventually the joints become stiff from disuse and the skin, muscles, and bone atrophy. The symptoms of CRPS vary in severity and duration. The cause of CRPS is unknown. The disorder is unique in that it simultaneously affects the:  Nerves.  Skin.  Muscles.  Blood vessels.  Bones. CRPS can strike at any age but is more common between the ages of 40 and 60. However, the number of CRPS cases among adolescents and young adults is increasing. CRPS is diagnosed primarily through observation of the symptoms. Some physicians use thermography to detect changes in body temperature that are common in CRPS. X-rays may also show changes in the bone.  TREATMENT  Treatments include:  Physicians use a variety of drugs to treat CRPS.  Elevation of the extremity and physical therapy are also used to treat CRPS.  Injection of a local anesthetic.  Applying brief pulses of electricity to nerve endings under the skin (transcutaneous electrical stimulation or TENS).  In some cases, interruption of the affected portion of the sympathetic nervous system (surgical or chemical sympathectomy) is necessary to relieve pain. This involves cutting the nerve or nerves. Pain is destroyed  almost instantly, but this treatment may also destroy other sensations. PROGNOSIS Good progress can be made in treating CRPS if treatment is begun early. Ideally treatment should begin within 3 months of the first symptoms. Early treatment often results in remission. If treatment is delayed, the disorder can quickly spread to the entire limb, and changes in bone and muscle may become irreversible. In 50 percent of CRPS cases, pain persists longer than 6 months and sometimes for years. Document Released: 04/30/2002 Document Revised: 08/02/2011 Document Reviewed: 03/20/2008 [employer] Patient Information 2015 [employer]. This information is not intended to replace advice given to you by your health care provider. Make sure you discuss any questions you have with your health care provider.  

## 2014-08-05 ENCOUNTER — Other Ambulatory Visit: Payer: Self-pay | Admitting: *Deleted

## 2014-08-05 ENCOUNTER — Ambulatory Visit: Payer: BLUE CROSS/BLUE SHIELD | Admitting: Physical Medicine & Rehabilitation

## 2014-08-05 MED ORDER — TAPENTADOL HCL 75 MG PO TABS
75.0000 mg | ORAL_TABLET | Freq: Every day | ORAL | Status: DC
Start: 2014-08-05 — End: 2014-08-12

## 2014-08-05 NOTE — Telephone Encounter (Signed)
Pt called, she has flu like symptoms, 102 degree temp yesterday, concerned about exposing clinic staff, asking if her son can come by and pick up rx for temporary supply of nucynta to last until she can make it to her rescheduled appt which has yet to be made

## 2014-08-05 NOTE — Telephone Encounter (Signed)
I informed Mrs Clendennen and her Margorie John Hart Carwin will pick up her one week supply.  She is scheduled for 11:00 on 08/12/14.  We have scheduled for 09/05/14 with Kirsteins and that will be returning on day 25 of the prescription to be given on 08/12/14.

## 2014-08-05 NOTE — Telephone Encounter (Signed)
I spoke to [facility] about this and we are to give her one week of meds and schedule appt. Patients NEED to be schedule before their last day of meds. I will print rx for Kirsteins to sign. Left voicemail for Mrs Lanford to return my call so that I discuss the rescheduled appt and bridge, and ask that the she make sure she gets her future appts scheduled to come back a week before she due to be out of medications so this will not be a problem. We are asking that all patients do this since our policy for releasing narcotic rxs without appts has changed.

## 2014-08-09 ENCOUNTER — Other Ambulatory Visit: Payer: Self-pay | Admitting: Physical Medicine & Rehabilitation

## 2014-08-12 ENCOUNTER — Encounter: Payer: Self-pay | Admitting: Registered Nurse

## 2014-08-12 ENCOUNTER — Encounter: Payer: BLUE CROSS/BLUE SHIELD | Attending: Physical Medicine & Rehabilitation | Admitting: Registered Nurse

## 2014-08-12 VITALS — BP 140/78 | HR 100 | Resp 14

## 2014-08-12 DIAGNOSIS — M797 Fibromyalgia: Secondary | ICD-10-CM | POA: Insufficient documentation

## 2014-08-12 DIAGNOSIS — M961 Postlaminectomy syndrome, not elsewhere classified: Secondary | ICD-10-CM | POA: Insufficient documentation

## 2014-08-12 DIAGNOSIS — M5412 Radiculopathy, cervical region: Secondary | ICD-10-CM | POA: Diagnosis not present

## 2014-08-12 DIAGNOSIS — Z5181 Encounter for therapeutic drug level monitoring: Secondary | ICD-10-CM

## 2014-08-12 DIAGNOSIS — Z79899 Other long term (current) drug therapy: Secondary | ICD-10-CM

## 2014-08-12 DIAGNOSIS — G90521 Complex regional pain syndrome I of right lower limb: Secondary | ICD-10-CM | POA: Insufficient documentation

## 2014-08-12 DIAGNOSIS — F32A Depression, unspecified: Secondary | ICD-10-CM

## 2014-08-12 DIAGNOSIS — F329 Major depressive disorder, single episode, unspecified: Secondary | ICD-10-CM

## 2014-08-12 MED ORDER — GABAPENTIN 600 MG PO TABS: 600.0000 mg | ORAL_TABLET | Freq: Four times a day (QID) | ORAL | Status: DC

## 2014-08-12 MED ORDER — TAPENTADOL HCL 75 MG PO TABS: 75.0000 mg | ORAL_TABLET | Freq: Every day | ORAL | Status: DC

## 2014-08-12 NOTE — Progress Notes (Signed)
Subjective:    Patient ID: Sabrina Williams, female    DOB: 1971-01-17, 44 y.o.   MRN: [id number]  HPI: [name] is a 44 year old female who returns for follow up for chronic pain and medication refill. She says her pain is located in her right foot. She rates her pain 5. Her current exercise regime is performing stretching  exercises using bands and walking short distances.  She had the Lumbar sympathetic injection on 07/22/14 and 07/25/2014 via Dr. Maryjean Ka. No relief was noted. She's not interested in returning to Dr. Maryjean Ka.  She scored a 14 on her PHQ-9 form she's interested in counseling admits to depression. No suicidal ideation or plan. Referral made to psychology she is aware the office will call her, she wants to go to counseling in her area.  Pain Inventory Average Pain 4 Pain Right Now 5 My pain is constant, sharp, burning, stabbing, tingling and aching  In the last 24 hours, has pain interfered with the following? General activity 7 Relation with others 3 Enjoyment of life 4 What TIME of day is your pain at its worst? evening and night Sleep (in general) Fair  Pain is worse with: walking, standing and some activites Pain improves with: rest and medication Relief from Meds: 7  Mobility walk without assistance how many minutes can you walk? 10 ability to climb steps?  yes do you drive?  yes  Function disabled: date disabled .  Neuro/Psych tingling  Prior Studies Any changes since last visit?  no  Physicians involved in your care Any changes since last visit?  no   Family History  Problem Relation Age of Onset  . Cancer Mother     multiple myeloma  . Diabetes Father   . Kidney disease Father   . Heart disease Father    History   Social History  . Marital Status: Married    Spouse Name: N/A  . Number of Children: N/A  . Years of Education: N/A   Social History Main Topics  . Smoking status: Never Smoker   . Smokeless tobacco: Never  Used  . Alcohol Use: No  . Drug Use: No  . Sexual Activity: Not on file   Other Topics Concern  . None   Social History Narrative   Past Surgical History  Procedure Laterality Date  . Small intestine surgery      cervical laminectomy  . Utereroscopy  1995  . Lithotripsy      multiple  . Shoulder surgery  6/06  . Cesarean section      x2  . Endoscopic plantar fasciotomy Right August 2015  . Dental implant  July 2015   Past Medical History  Diagnosis Date  . Neuromuscular disorder   . Diabetes mellitus   . Depression   . Hypertension   . Chronic kidney disease     stones   BP 140/78 mmHg  Pulse 100  Resp 14  SpO2 99%  Opioid Risk Score:   Fall Risk Score: Low Fall Risk (0-5 points)`1  Depression screen PHQ 2/9  No flowsheet data found.   Review of Systems  Constitutional: Negative.   HENT: Negative.   Eyes: Negative.   Respiratory: Negative.   Cardiovascular: Negative.   Endocrine: Negative.   Genitourinary: Positive for enuresis.  Musculoskeletal: Positive for myalgias, back pain and arthralgias.  Skin: Negative.   Allergic/Immunologic: Negative.   Neurological: Negative.   Hematological: Negative.   Psychiatric/Behavioral: Negative.  Objective:   Physical Exam  Constitutional: She is oriented to person, place, and time. She appears well-developed and well-nourished.  HENT:  Head: Normocephalic and atraumatic.  Neck: Normal range of motion. Neck supple.  Cardiovascular: Normal rate and regular rhythm.   Pulmonary/Chest: Effort normal and breath sounds normal.  Musculoskeletal:  Normal Muscle Bulk and Muscle Testing Reveals: Upper Extremities: Full ROM and Muscle Strength 5/5 Lower Extremities: Full ROM and Muscle Strength 5/5 Arises from chair with ease Narrow Based Gait  Neurological: She is alert and oriented to person, place, and time.  Skin: Skin is warm and dry.  Psychiatric: She has a normal mood and affect.  Nursing note and  vitals reviewed.         Assessment & Plan:  1. Complex regional pain syndrome right lower extremity postoperative. She's s/p post plantar fascial release. She has sensitivity touch. She has diffuse numbness and tingling. S/P Lumbar Sympathetic Injection via Dr. Maryjean Ka: No relief noted. Refilled: Nucynta 75 mg one tablet five times a day. #150.  2.Myofascial pain syndrome: Continue with voltaren gel, heat and exercise regime.  20 minutes of face to face patient care time was spent during this visit. All questions were encouraged and answered.  F/U in 1 month

## 2014-09-01 ENCOUNTER — Other Ambulatory Visit: Payer: Self-pay | Admitting: Physical Medicine & Rehabilitation

## 2014-09-05 ENCOUNTER — Encounter: Payer: Self-pay | Admitting: Physical Medicine & Rehabilitation

## 2014-09-05 ENCOUNTER — Ambulatory Visit (HOSPITAL_BASED_OUTPATIENT_CLINIC_OR_DEPARTMENT_OTHER): Payer: BLUE CROSS/BLUE SHIELD | Admitting: Physical Medicine & Rehabilitation

## 2014-09-05 ENCOUNTER — Encounter: Payer: BLUE CROSS/BLUE SHIELD | Attending: Physical Medicine & Rehabilitation

## 2014-09-05 VITALS — BP 142/72 | HR 99 | Resp 14

## 2014-09-05 DIAGNOSIS — G90521 Complex regional pain syndrome I of right lower limb: Secondary | ICD-10-CM | POA: Diagnosis not present

## 2014-09-05 DIAGNOSIS — Z5181 Encounter for therapeutic drug level monitoring: Secondary | ICD-10-CM | POA: Diagnosis not present

## 2014-09-05 DIAGNOSIS — M961 Postlaminectomy syndrome, not elsewhere classified: Secondary | ICD-10-CM | POA: Diagnosis not present

## 2014-09-05 DIAGNOSIS — M5412 Radiculopathy, cervical region: Secondary | ICD-10-CM | POA: Diagnosis not present

## 2014-09-05 DIAGNOSIS — M797 Fibromyalgia: Secondary | ICD-10-CM | POA: Diagnosis not present

## 2014-09-05 MED ORDER — TAPENTADOL HCL 75 MG PO TABS: 75.0000 mg | ORAL_TABLET | Freq: Every day | ORAL | Status: DC

## 2014-09-05 NOTE — Progress Notes (Signed)
Subjective:    Patient ID: Sabrina Williams, female    DOB: 1971-05-22, 44 y.o.   MRN: [id number]  HPI 5-10 walking tolerance either Right foot pain as well as poor stamina 44 year old female with prior history of cervical postlaminectomy syndrome and chronic left C6 radiculopathy. She has had problems with plantar fasciitis last couple years however had surgery in August 2015 and postoperatively developed increasing pain in the foot and ankle area as well as swelling in the foot redness and the foot which I documented on previous office visits. She underwent 2 rounds of prednisone orally and then switch from tramadol to Nucynta. Referred for lumbar sympathetic injections. Had 2 of them and then did not have any further significant vasomotor changes. Overall she feels better with her right foot. She is trying to walk more. She feels the Nucynta has been very helpful for her. She uses crocs as her main shoewear, is planning to get some athletic shoes, she would benefit from her orthosis along with this.   Pain Inventory Average Pain 4 Pain Right Now 3 My pain is constant, sharp, burning, tingling and aching  In the last 24 hours, has pain interfered with the following? General activity 6 Relation with others 4 Enjoyment of life 5 What TIME of day is your pain at its worst? evening Sleep (in general) Fair  Pain is worse with: walking, standing and some activites Pain improves with: rest, therapy/exercise and medication Relief from Meds: 5  Mobility walk without assistance how many minutes can you walk? 5-10 ability to climb steps?  yes do you drive?  yes Do you have any goals in this area?  yes  Function not employed: date last employed . I need assistance with the following:  meal prep, household duties and shopping Do you have any goals in this area?  no  Neuro/Psych numbness tingling depression  Prior Studies Any changes since last visit?  no  Physicians involved  in your care Any changes since last visit?  no   Family History  Problem Relation Age of Onset  . Cancer Mother     multiple myeloma  . Diabetes Father   . Kidney disease Father   . Heart disease Father    History   Social History  . Marital Status: Married    Spouse Name: N/A  . Number of Children: N/A  . Years of Education: N/A   Social History Main Topics  . Smoking status: Never Smoker   . Smokeless tobacco: Never Used  . Alcohol Use: No  . Drug Use: No  . Sexual Activity: Not on file   Other Topics Concern  . None   Social History Narrative   Past Surgical History  Procedure Laterality Date  . Small intestine surgery      cervical laminectomy  . Utereroscopy  1995  . Lithotripsy      multiple  . Shoulder surgery  6/06  . Cesarean section      x2  . Endoscopic plantar fasciotomy Right August 2015  . Dental implant  July 2015   Past Medical History  Diagnosis Date  . Neuromuscular disorder   . Diabetes mellitus   . Depression   . Hypertension   . Chronic kidney disease     stones   BP 142/72 mmHg  Pulse 99  Resp 14  SpO2 97%  Opioid Risk Score:   Fall Risk Score: Low Fall Risk (0-5 points) (patient previously educated)`1  Depression screen PHQ  2/9  Depression screen PHQ 2/9 08/12/2014  Decreased Interest 1  Down, Depressed, Hopeless 1  PHQ - 2 Score 2  Altered sleeping 1  Tired, decreased energy 3  Change in appetite 2  Feeling bad or failure about yourself  3  Trouble concentrating 2  Moving slowly or fidgety/restless 1  Suicidal thoughts 0  PHQ-9 Score 14     Review of Systems  Constitutional:       Weight gain  Musculoskeletal: Positive for gait problem.  Neurological: Positive for numbness.       Tingling  Psychiatric/Behavioral: Positive for dysphoric mood.  All other systems reviewed and are negative.      Objective:   Physical Exam  Constitutional: She is oriented to person, place, and time. She appears  well-developed.  Morbidly obese  HENT:  Head: Normocephalic and atraumatic.  Eyes: Conjunctivae are normal. Pupils are equal, round, and reactive to light.  Neurological: She is alert and oriented to person, place, and time.  Psychiatric: She has a normal mood and affect.  Nursing note and vitals reviewed.  She has mild pain with toe flexion extension no pain with light brushing of the dorsum of the foot. Ankle range of motion is good. She has no swelling or erythema in the right foot or ankle area.  She is able to ambulate without evidence to drag or knee instability. Mood and affect are appropriate       Assessment & Plan:  1.  Plantar fasciitis with surgery- January 15, 2014 with post op CRPS 1, Her symptoms have improved to a great degree with treatment which has included PT, oral medications as well as 2 lumbar Sympathetic injections. At this point I agree that no further injections are needed. Will continue Nucynta 75 mg up to 5 times per day. If she is doing well next month would consider either  Reducing dose to 50 mg 5 times per day or reducing frequency to 75 mg 4 times per day  One-month follow-up with nurse practitioner  2. Cervical postlaminectomy syndrome with chronic radiculitis, Continue gabapentin 600 mg 4 times a day, Continue Zanaflex 4 mg 4 times a day  2.  Cervical post laminectomy syndrome

## 2014-09-05 NOTE — Patient Instructions (Signed)
Will check on referral to psychology

## 2014-10-08 ENCOUNTER — Encounter: Payer: Self-pay | Admitting: Registered Nurse

## 2014-10-08 ENCOUNTER — Encounter: Payer: BLUE CROSS/BLUE SHIELD | Attending: Physical Medicine & Rehabilitation | Admitting: Registered Nurse

## 2014-10-08 VITALS — BP 155/88 | HR 92 | Resp 14

## 2014-10-08 DIAGNOSIS — M797 Fibromyalgia: Secondary | ICD-10-CM | POA: Diagnosis not present

## 2014-10-08 DIAGNOSIS — F32A Depression, unspecified: Secondary | ICD-10-CM

## 2014-10-08 DIAGNOSIS — G90521 Complex regional pain syndrome I of right lower limb: Secondary | ICD-10-CM | POA: Diagnosis not present

## 2014-10-08 DIAGNOSIS — Z79899 Other long term (current) drug therapy: Secondary | ICD-10-CM

## 2014-10-08 DIAGNOSIS — M5412 Radiculopathy, cervical region: Secondary | ICD-10-CM | POA: Diagnosis not present

## 2014-10-08 DIAGNOSIS — M961 Postlaminectomy syndrome, not elsewhere classified: Secondary | ICD-10-CM | POA: Diagnosis not present

## 2014-10-08 DIAGNOSIS — Z5181 Encounter for therapeutic drug level monitoring: Secondary | ICD-10-CM | POA: Diagnosis not present

## 2014-10-08 DIAGNOSIS — F329 Major depressive disorder, single episode, unspecified: Secondary | ICD-10-CM

## 2014-10-08 DIAGNOSIS — M7918 Myalgia, other site: Secondary | ICD-10-CM

## 2014-10-08 MED ORDER — TAPENTADOL HCL 75 MG PO TABS: 75.0000 mg | ORAL_TABLET | Freq: Every day | ORAL | Status: DC

## 2014-10-08 NOTE — Progress Notes (Signed)
Subjective:    Patient ID: Sabrina Williams, female    DOB: 1970-10-17, 44 y.o.   MRN: [id number]  HPI: Sabrina Williams is a 44 year old female who returns for follow up for chronic pain and medication refill. She says her pain is located in her right foot and bilateral knees. She rates her pain 7. Her current exercise regime is performing stretching exercisesand walking short distances.   Sabrina Williams doesn't want to decreased her Nucynta this month due to her pain, we will re-assess next month. She verbalizes understanding.  Pain Inventory Average Pain 5 Pain Right Now 7 My pain is constant, sharp, burning, dull, stabbing, tingling and aching  In the last 24 hours, has pain interfered with the following? General activity 6 Relation with others 4 Enjoyment of life 4 What TIME of day is your pain at its worst? evening and night Sleep (in general) Good  Pain is worse with: walking and standing Pain improves with: rest and medication Relief from Meds: no answer  Mobility walk without assistance how many minutes can you walk? 10-15 ability to climb steps?  yes do you drive?  yes  Function disabled: date disabled 2006 I need assistance with the following:  meal prep, household duties and shopping  Neuro/Psych numbness trouble walking  Prior Studies Any changes since last visit?  no  Physicians involved in your care Any changes since last visit?  no   Family History  Problem Relation Age of Onset  . Cancer Mother     multiple myeloma  . Diabetes Father   . Kidney disease Father   . Heart disease Father    History   Social History  . Marital Status: Married    Spouse Name: N/A  . Number of Children: N/A  . Years of Education: N/A   Social History Main Topics  . Smoking status: Never Smoker   . Smokeless tobacco: Never Used  . Alcohol Use: No  . Drug Use: No  . Sexual Activity: Not on file   Other Topics Concern  . None   Social History  Narrative   Past Surgical History  Procedure Laterality Date  . Small intestine surgery      cervical laminectomy  . Utereroscopy  1995  . Lithotripsy      multiple  . Shoulder surgery  6/06  . Cesarean section      x2  . Endoscopic plantar fasciotomy Right August 2015  . Dental implant  July 2015   Past Medical History  Diagnosis Date  . Neuromuscular disorder   . Diabetes mellitus   . Depression   . Hypertension   . Chronic kidney disease     stones   BP 155/88 mmHg  Pulse 92  Resp 14  SpO2 16%  Opioid Risk Score:   Fall Risk Score: Low Fall Risk (0-5 points) (previously educated and given handout)`1  Depression screen PHQ 2/9  Depression screen PHQ 2/9 08/12/2014  Decreased Interest 1  Down, Depressed, Hopeless 1  PHQ - 2 Score 2  Altered sleeping 1  Tired, decreased energy 3  Change in appetite 2  Feeling bad or failure about yourself  3  Trouble concentrating 2  Moving slowly or fidgety/restless 1  Suicidal thoughts 0  PHQ-9 Score 14    Review of Systems  Musculoskeletal: Positive for gait problem.  Neurological: Positive for numbness.  All other systems reviewed and are negative.      Objective:  Physical Exam  Constitutional: She is oriented to person, place, and time. She appears well-developed and well-nourished.  HENT:  Head: Normocephalic and atraumatic.  Neck: Normal range of motion. Neck supple.  Cardiovascular: Normal rate and regular rhythm.   Pulmonary/Chest: Effort normal and breath sounds normal.  Musculoskeletal:  Normal Muscle Bulk and Muscle Testing Reveals: Upper Extremities: Full ROM and Muscle Strength 5/5 Left AC Joint Tenderness Thoracic Paraspinal Tenderness: T-1- T-3 Lower Extremities: Full ROM and Muscle Strength 5/5 Arises from chair with ease Wide Based Gait  Neurological: She is alert and oriented to person, place, and time.  Skin: Skin is warm and dry.  Psychiatric: She has a normal mood and affect.  Nursing  note and vitals reviewed.         Assessment & Plan:  1. Complex regional pain syndrome right lower extremity postoperative. She's s/p post plantar fascial release. She has sensitivity touch. She has diffuse numbness and tingling. Refilled: Nucynta 75 mg one tablet five times a day. #150.  2.Myofascial pain syndrome: Continue with voltaren gel, heat and exercise regime.  20 minutes of face to face patient care time was spent during this visit. All questions were encouraged and answered. 3. Depression: Continue: Zoloft  20 minutes of face to face patient care time was spent during this visit. All questions was encouraged and answered.  F/U in 1 month

## 2014-10-31 ENCOUNTER — Encounter: Payer: Self-pay | Admitting: Registered Nurse

## 2014-10-31 ENCOUNTER — Encounter: Payer: BLUE CROSS/BLUE SHIELD | Attending: Physical Medicine & Rehabilitation | Admitting: Registered Nurse

## 2014-10-31 VITALS — BP 135/72 | HR 99 | Resp 16

## 2014-10-31 DIAGNOSIS — Z5181 Encounter for therapeutic drug level monitoring: Secondary | ICD-10-CM | POA: Insufficient documentation

## 2014-10-31 DIAGNOSIS — M797 Fibromyalgia: Secondary | ICD-10-CM | POA: Insufficient documentation

## 2014-10-31 DIAGNOSIS — M961 Postlaminectomy syndrome, not elsewhere classified: Secondary | ICD-10-CM | POA: Insufficient documentation

## 2014-10-31 DIAGNOSIS — M5412 Radiculopathy, cervical region: Secondary | ICD-10-CM | POA: Insufficient documentation

## 2014-10-31 DIAGNOSIS — Z79899 Other long term (current) drug therapy: Secondary | ICD-10-CM

## 2014-10-31 DIAGNOSIS — G90521 Complex regional pain syndrome I of right lower limb: Secondary | ICD-10-CM | POA: Insufficient documentation

## 2014-10-31 DIAGNOSIS — M7918 Myalgia, other site: Secondary | ICD-10-CM

## 2014-10-31 MED ORDER — TAPENTADOL HCL 75 MG PO TABS: 75.0000 mg | ORAL_TABLET | Freq: Every day | ORAL | Status: DC

## 2014-10-31 NOTE — Progress Notes (Signed)
Subjective:    Patient ID: Sabrina Williams, female    DOB: 1971-02-04, 44 y.o.   MRN: [id number]  HPI: [name] is a 44 year old female who returns for follow up for chronic pain and medication refill. She says her pain is located in her left shoulder, left arm and right foot.. She rates her pain 3. Her current exercise regime is performing stretching exercisesand walking short distances.   Ms. Dant doesn't want to decreased her Nucynta this month she has increased her activity.  Arrived tachycardic pulse re-checked 99.  Pain Inventory Average Pain 4 Pain Right Now 3 My pain is constant  In the last 24 hours, has pain interfered with the following? General activity 5 Relation with others 4 Enjoyment of life 5 What TIME of day is your pain at its worst? evening Sleep (in general) NA  Pain is worse with: some activites Pain improves with: medication Relief from Meds: not answered  Mobility walk without assistance  Function Do you have any goals in this area?  no  Neuro/Psych No problems in this area  Prior Studies Any changes since last visit?  no  Physicians involved in your care Any changes since last visit?  no   Family History  Problem Relation Age of Onset  . Cancer Mother     multiple myeloma  . Diabetes Father   . Kidney disease Father   . Heart disease Father    History   Social History  . Marital Status: Married    Spouse Name: N/A  . Number of Children: N/A  . Years of Education: N/A   Social History Main Topics  . Smoking status: Never Smoker   . Smokeless tobacco: Never Used  . Alcohol Use: No  . Drug Use: No  . Sexual Activity: Not on file   Other Topics Concern  . None   Social History Narrative   Past Surgical History  Procedure Laterality Date  . Small intestine surgery      cervical laminectomy  . Utereroscopy  1995  . Lithotripsy      multiple  . Shoulder surgery  6/06  . Cesarean section      x2  .  Endoscopic plantar fasciotomy Right August 2015  . Dental implant  July 2015   Past Medical History  Diagnosis Date  . Neuromuscular disorder   . Diabetes mellitus   . Depression   . Hypertension   . Chronic kidney disease     stones   BP 135/72 mmHg  Pulse 111  Resp 16  SpO2 98%  Opioid Risk Score:   Fall Risk Score: Low Fall Risk (0-5 points)`1  Depression screen PHQ 2/9  Depression screen PHQ 2/9 08/12/2014  Decreased Interest 1  Down, Depressed, Hopeless 1  PHQ - 2 Score 2  Altered sleeping 1  Tired, decreased energy 3  Change in appetite 2  Feeling bad or failure about yourself  3  Trouble concentrating 2  Moving slowly or fidgety/restless 1  Suicidal thoughts 0  PHQ-9 Score 14     Review of Systems  All other systems reviewed and are negative.      Objective:   Physical Exam  Constitutional: She is oriented to person, place, and time. She appears well-developed and well-nourished.  HENT:  Head: Normocephalic and atraumatic.  Neck: Normal range of motion. Neck supple.  Cardiovascular: Normal rate and regular rhythm.   Pulmonary/Chest: Effort normal and breath sounds normal.  Musculoskeletal:  Normal Muscle Bulk and Muscle Testing Reveals:  Upper Extremities: Full ROM and Muscle Strength 5/5 Left Rhomboid Tenderness Lower Extremities: Full ROM and Muscle Strength 5/5 Arises from chair with ease Narrow Based gait   Neurological: She is alert and oriented to person, place, and time.  Skin: Skin is warm and dry.  Nursing note and vitals reviewed.         Assessment & Plan:  1. Complex regional pain syndrome right lower extremity postoperative. She's s/p post plantar fascial release. She has sensitivity touch. She has diffuse numbness and tingling. Refilled: Nucynta 75 mg one tablet five times a day. #150. 2.Myofascial pain syndrome: Continue with voltaren gel, heat and exercise regime.  3. Depression: Continue: Zoloft  20 minutes of face to  face patient care time was spent during this visit. All questions was encouraged and answered.  F/U in 1 month

## 2014-12-02 ENCOUNTER — Other Ambulatory Visit: Payer: Self-pay | Admitting: Registered Nurse

## 2014-12-05 ENCOUNTER — Encounter: Payer: BLUE CROSS/BLUE SHIELD | Attending: Physical Medicine & Rehabilitation

## 2014-12-05 ENCOUNTER — Ambulatory Visit (HOSPITAL_BASED_OUTPATIENT_CLINIC_OR_DEPARTMENT_OTHER): Payer: BLUE CROSS/BLUE SHIELD | Admitting: Physical Medicine & Rehabilitation

## 2014-12-05 ENCOUNTER — Encounter: Payer: Self-pay | Admitting: Physical Medicine & Rehabilitation

## 2014-12-05 VITALS — BP 146/86 | HR 111 | Resp 16

## 2014-12-05 DIAGNOSIS — G90521 Complex regional pain syndrome I of right lower limb: Secondary | ICD-10-CM

## 2014-12-05 DIAGNOSIS — M961 Postlaminectomy syndrome, not elsewhere classified: Secondary | ICD-10-CM

## 2014-12-05 DIAGNOSIS — Z5181 Encounter for therapeutic drug level monitoring: Secondary | ICD-10-CM | POA: Diagnosis not present

## 2014-12-05 DIAGNOSIS — M797 Fibromyalgia: Secondary | ICD-10-CM | POA: Diagnosis not present

## 2014-12-05 DIAGNOSIS — M25511 Pain in right shoulder: Secondary | ICD-10-CM | POA: Diagnosis not present

## 2014-12-05 DIAGNOSIS — M5412 Radiculopathy, cervical region: Secondary | ICD-10-CM | POA: Insufficient documentation

## 2014-12-05 MED ORDER — TAPENTADOL HCL 50 MG PO TABS: 50.0000 mg | ORAL_TABLET | ORAL | Status: DC | PRN

## 2014-12-05 NOTE — Progress Notes (Signed)
 Subjective:    Patient ID: Sabrina Williams, female    DOB: 10/27/1970, 44 y.o.   MRN: [id number] 5-10 walking tolerance either Right foot pain as well as poor stamina 44-year-old female with prior history of cervical postlaminectomy syndrome and chronic left C6 radiculopathy. She has had problems with plantar fasciitis last couple years however had surgery in August 2015 and postoperatively developed increasing pain in the foot and ankle area as well as swelling in the foot redness and the foot which I documented on previous office visits. She underwent 2 rounds of prednisone orally and then switch from tramadol to Nucynta. Referred for lumbar sympathetic injections. Had 2 of them and then did not have any further significant vasomotor changes. Overall she feels better with her right foot. She is trying to walk more. She feels the Nucynta has been very helpful for her. She uses crocs as her main shoewear, is planning to get some athletic shoes, she would benefit from her orthosis along with this.  HPI No am meds today due to driving here C/o Left bra strap when seat bealt on it Pain in Right foot centered in 2nd and 3rd toes, dropped skillet last year , xrays,  Both feet swelling, weight gain, Rings are tight, seeing PCP soon  Right shoulder pain, hx of dislocation 1997, relocation spontaneously, similar symptoms in the Right shoulder recently   Pain Inventory Average Pain 5 Pain Right Now 5 My pain is constant, sharp, burning, dull, stabbing, tingling and aching  In the last 24 hours, has pain interfered with the following? General activity 7 Relation with others 5 Enjoyment of life 6 What TIME of day is your pain at its worst? evening Sleep (in general) Good  Pain is worse with: walking, standing and some activites Pain improves with: rest and medication Relief from Meds: 7  Mobility walk without assistance how many minutes can you walk? 15-20 ability to climb steps?   yes do you drive?  yes  Function not employed: date last employed . I need assistance with the following:  meal prep, household duties and shopping  Neuro/Psych No problems in this area  Prior Studies Any changes since last visit?  no  Physicians involved in your care Any changes since last visit?  no   Family History  Problem Relation Age of Onset  . Cancer Mother     multiple myeloma  . Diabetes Father   . Kidney disease Father   . Heart disease Father    History   Social History  . Marital Status: Married    Spouse Name: N/A  . Number of Children: N/A  . Years of Education: N/A   Social History Main Topics  . Smoking status: Never Smoker   . Smokeless tobacco: Never Used  . Alcohol Use: No  . Drug Use: No  . Sexual Activity: Not on file   Other Topics Concern  . None   Social History Narrative   Past Surgical History  Procedure Laterality Date  . Small intestine surgery      cervical laminectomy  . Utereroscopy  1995  . Lithotripsy      multiple  . Shoulder surgery  6/06  . Cesarean section      x2  . Endoscopic plantar fasciotomy Right August 2015  . Dental implant  July 2015   Past Medical History  Diagnosis Date  . Neuromuscular disorder   . Diabetes mellitus   . Depression   . Hypertension   .   Chronic kidney disease     stones   BP 146/86 mmHg  Pulse 111  Resp 16  SpO2 98%  Opioid Risk Score:   Fall Risk Score: Low Fall Risk (0-5 points)`1  Depression screen PHQ 2/9  Depression screen PHQ 2/9 08/12/2014  Decreased Interest 1  Down, Depressed, Hopeless 1  PHQ - 2 Score 2  Altered sleeping 1  Tired, decreased energy 3  Change in appetite 2  Feeling bad or failure about yourself  3  Trouble concentrating 2  Moving slowly or fidgety/restless 1  Suicidal thoughts 0  PHQ-9 Score 14     Review of Systems  Constitutional: Positive for diaphoresis and unexpected weight change.  Cardiovascular: Positive for leg swelling.   Neurological: Positive for numbness.       Tingling  All other systems reviewed and are negative.      Objective:   Physical Exam  Constitutional: She is oriented to person, place, and time. She appears well-developed and well-nourished.  HENT:  Head: Normocephalic and atraumatic.  Eyes: Conjunctivae and EOM are normal. Pupils are equal, round, and reactive to light.  Neck:  Mildly reduced cervical range of motion  Musculoskeletal:  Patient has decreased right ankle dorsiflexion plantar flexion actively.  Neurological: She is alert and oriented to person, place, and time.  Patient is able to name simple objects  Motor strength is 4/5 and left deltoid, biceps, triceps, grip 5/5 in the right deltoid, biceps, triceps, grip 5/5 and left hip flexor and extensor ankle dorsi flexion 4/5 in the right hip flexion and extensor ankle dorsal flexor plantar flexor  Psychiatric: She has a normal mood and affect.    Decreased sensation left thumb index and middle finger dorsal surface Decreased sensation in the right foot and ankle nondermatomal. Mild pedal as well as dorsal hand edema No joint swelling in hands and feet Negative apprehension signAt the right shoulder     Assessment & Plan:  1.  Plantar fasciitis with surgery- January 15, 2014 with post op CRPS 1, Her symptoms have improved to a great degree with treatment which has included PT, oral medications as well as 2 lumbar Sympathetic injections. At this point I no further injections are needed.We will also try to reduce Nucynta  Will Reduce Nucynta 50 mg up to 5 times per day.  2. Cervical postlaminectomy syndrome with chronic C6 radiculitis, Continue gabapentin 600 mg 4 times a day, Continue Zanaflex 4 mg 4 times a day    3.  Right shoulder pain, hx of dislocation, Recommend orthopedic evaluation, No clear-cut apprehension signs, she has some feeling of potential dislocation with internal rotation reaching behind her back more  so than with external rotation  4.  Naming problems-episodic, also with short term  Memory loss , Since a medication-related effect would tend to be more consistent,Recommend neurology evaluation If reduction in Nucynta dose does not help with this  5. Diffuse diffuse swelling in the extremities. Not clearly in the joints. Would get PCP evaluation. If this is negative consider rheumatology evaluation, arthritis panel from one year ago was normal.

## 2014-12-05 NOTE — Patient Instructions (Signed)
1.  Plantar fasciitis with surgery- January 15, 2014 with post op CRPS 1, Her symptoms have improved to a great degree with treatment which has included PT, oral medications as well as 2 lumbar Sympathetic injections. At this point I agree that no further injections are needed.  Will continue Nucynta 75 mg up to 5 times per day.  2. Cervical postlaminectomy syndrome with chronic radiculitis, Continue gabapentin 600 mg 4 times a day, Continue Zanaflex 4 mg 4 times a day  3.  Cervical post laminectomy syndrome  4.  Right shoulder pain, hx of dislocation, Recommend orthopedic evaluation  5.  Naming problems-episodic, also with short term  Memory loss Recommend neurology evaluation  6. Diffuse diffuse swelling in the extremities. Not clearly in the joints. Would get PCP evaluation. If this is negative consider rheumatology evaluation, arthritis panel from one year ago was normal.

## 2014-12-19 ENCOUNTER — Ambulatory Visit: Payer: BLUE CROSS/BLUE SHIELD | Admitting: Registered Nurse

## 2014-12-20 ENCOUNTER — Encounter: Payer: BLUE CROSS/BLUE SHIELD | Admitting: Registered Nurse

## 2015-01-01 ENCOUNTER — Encounter: Payer: Self-pay | Admitting: Registered Nurse

## 2015-01-01 ENCOUNTER — Encounter: Payer: BLUE CROSS/BLUE SHIELD | Attending: Physical Medicine & Rehabilitation | Admitting: Registered Nurse

## 2015-01-01 VITALS — BP 146/77 | HR 88

## 2015-01-01 DIAGNOSIS — Z5181 Encounter for therapeutic drug level monitoring: Secondary | ICD-10-CM | POA: Diagnosis not present

## 2015-01-01 DIAGNOSIS — M797 Fibromyalgia: Secondary | ICD-10-CM

## 2015-01-01 DIAGNOSIS — M25511 Pain in right shoulder: Secondary | ICD-10-CM | POA: Diagnosis not present

## 2015-01-01 DIAGNOSIS — Z79899 Other long term (current) drug therapy: Secondary | ICD-10-CM

## 2015-01-01 DIAGNOSIS — G90521 Complex regional pain syndrome I of right lower limb: Secondary | ICD-10-CM | POA: Diagnosis not present

## 2015-01-01 DIAGNOSIS — M5412 Radiculopathy, cervical region: Secondary | ICD-10-CM | POA: Diagnosis not present

## 2015-01-01 DIAGNOSIS — M7918 Myalgia, other site: Secondary | ICD-10-CM

## 2015-01-01 DIAGNOSIS — M961 Postlaminectomy syndrome, not elsewhere classified: Secondary | ICD-10-CM | POA: Insufficient documentation

## 2015-01-01 MED ORDER — TAPENTADOL HCL 75 MG PO TABS: 75.0000 mg | ORAL_TABLET | Freq: Every day | ORAL | Status: DC

## 2015-01-01 MED ORDER — TAPENTADOL HCL 75 MG PO TABS
75.0000 mg | ORAL_TABLET | Freq: Every day | ORAL | Status: DC
Start: 2015-01-01 — End: 2015-02-03

## 2015-01-01 NOTE — Progress Notes (Signed)
Subjective:    Patient ID: Sabrina Williams, female    DOB: 10/27/1970, 44 y.o.   MRN: [id number]  HPI: [name] is a 44 year old female who returns for follow up for chronic pain and medication refill. She says her pain is located in her left hand, left shoulder and right foot. Also states her pain has increased in intensity since her Nucynta was decreased last month. Also states she's have numbness and tingling in her left hand and bilateral feet. Facial grimacing noted with palpation . Also states she hasn't been active, her husband in room and concur's with her statement.  She rates her pain 5. She has not followed her usual  exercise regime due to pain.  Will increase her Nucynta to 75 mg five times a day, second script given to accommodate scheduled appointment with Dr. Wynn Banker. Also was instructed to return the Nucynta 50 mg at next appointment she verbalizes understanding. We will not destroy her Nucynta at this time to make sure she doesn't have an insurance issue per the new recommendation's of office. She verbalizes understanding.  Pain Inventory Average Pain 6 Pain Right Now 5 My pain is constant, sharp, burning, dull, tingling and aching  In the last 24 hours, has pain interfered with the following? General activity 7 Relation with others 5 Enjoyment of life 6 What TIME of day is your pain at its worst? evening ,night Sleep (in general) NA  Pain is worse with: walking, standing and some activites Pain improves with: rest and medication Relief from Meds: 7  Mobility walk without assistance how many minutes can you walk? 10-15 ability to climb steps?  yes do you drive?  yes  Function not employed: date last employed . I need assistance with the following:  meal prep, household duties and shopping  Neuro/Psych No problems in this area  Prior Studies Any changes since last visit?  no  Physicians involved in your care Any changes since last visit?   no   Family History  Problem Relation Age of Onset  . Cancer Mother     multiple myeloma  . Diabetes Father   . Kidney disease Father   . Heart disease Father    Social History   Social History  . Marital Status: Married    Spouse Name: N/A  . Number of Children: N/A  . Years of Education: N/A   Social History Main Topics  . Smoking status: Never Smoker   . Smokeless tobacco: Never Used  . Alcohol Use: No  . Drug Use: No  . Sexual Activity: Not Asked   Other Topics Concern  . None   Social History Narrative   Past Surgical History  Procedure Laterality Date  . Small intestine surgery      cervical laminectomy  . Utereroscopy  1995  . Lithotripsy      multiple  . Shoulder surgery  6/06  . Cesarean section      x2  . Endoscopic plantar fasciotomy Right August 2015  . Dental implant  July 2015   Past Medical History  Diagnosis Date  . Neuromuscular disorder   . Diabetes mellitus   . Depression   . Hypertension   . Chronic kidney disease     stones   BP 146/77 mmHg  Pulse 88  SpO2 96%  Opioid Risk Score:   Fall Risk Score:  `1  Depression screen PHQ 2/9  Depression screen [facility] 2/9 01/01/2015 08/12/2014  Decreased  Interest 1 1  Down, Depressed, Hopeless 1 1  PHQ - 2 Score 2 2  Altered sleeping - 1  Tired, decreased energy - 3  Change in appetite - 2  Feeling bad or failure about yourself  - 3  Trouble concentrating - 2  Moving slowly or fidgety/restless - 1  Suicidal thoughts - 0  PHQ-9 Score - 14     Review of Systems  All other systems reviewed and are negative.      Objective:   Physical Exam  Constitutional: She is oriented to person, place, and time. She appears well-developed and well-nourished.  HENT:  Head: Normocephalic and atraumatic.  Neck: Normal range of motion. Neck supple.  Cardiovascular: Normal rate and regular rhythm.   Pulmonary/Chest: Effort normal and breath sounds normal.  Musculoskeletal:  Normal Muscle Bulk and  Muscle Testing Reveals: Upper Extremities: Right: Decreased ROM 90 Degrees and Muscle Strength 4/5 Left: Full ROM and Muscle Strength 5/5 Right AC Joint Tenderness  Lower Extremities: Full ROM and Muscle Strength 5/5 Bilateral Lower Extremities Flexion and Extension Produces Pain into Feet. Arises from chair with ease Narrow Based gait  Neurological: She is alert and oriented to person, place, and time.  Skin: Skin is warm and dry.  Psychiatric: She has a normal mood and affect.  Nursing note and vitals reviewed.         Assessment & Plan:  1. Complex regional pain syndrome right lower extremity postoperative. She's s/p post plantar fascial release. She has sensitivity touch. She has diffuse numbness and tingling. RX: Nucynta 75 mg one tablet five times a day. #150. Second script given to accommodate scheduled appointment. 2.Myofascial pain syndrome: Continue with voltaren gel, heat and exercise regime.  3. Depression: Continue: Zoloft  20 minutes of face to face patient care time was spent during this visit. All questions was encouraged and answered.  F/U in 1 month

## 2015-01-17 ENCOUNTER — Other Ambulatory Visit: Payer: Self-pay | Admitting: Physical Medicine & Rehabilitation

## 2015-02-03 ENCOUNTER — Ambulatory Visit (HOSPITAL_BASED_OUTPATIENT_CLINIC_OR_DEPARTMENT_OTHER): Payer: BLUE CROSS/BLUE SHIELD | Admitting: Physical Medicine & Rehabilitation

## 2015-02-03 ENCOUNTER — Encounter: Payer: Self-pay | Admitting: Physical Medicine & Rehabilitation

## 2015-02-03 ENCOUNTER — Encounter: Payer: BLUE CROSS/BLUE SHIELD | Attending: Physical Medicine & Rehabilitation

## 2015-02-03 VITALS — BP 135/79 | HR 88 | Resp 14

## 2015-02-03 DIAGNOSIS — M25511 Pain in right shoulder: Secondary | ICD-10-CM

## 2015-02-03 DIAGNOSIS — M961 Postlaminectomy syndrome, not elsewhere classified: Secondary | ICD-10-CM

## 2015-02-03 DIAGNOSIS — G90521 Complex regional pain syndrome I of right lower limb: Secondary | ICD-10-CM | POA: Diagnosis not present

## 2015-02-03 DIAGNOSIS — Z5181 Encounter for therapeutic drug level monitoring: Secondary | ICD-10-CM | POA: Insufficient documentation

## 2015-02-03 DIAGNOSIS — M5412 Radiculopathy, cervical region: Secondary | ICD-10-CM

## 2015-02-03 DIAGNOSIS — M797 Fibromyalgia: Secondary | ICD-10-CM | POA: Insufficient documentation

## 2015-02-03 MED ORDER — TAPENTADOL HCL 75 MG PO TABS: 75.0000 mg | ORAL_TABLET | Freq: Every day | ORAL | Status: DC

## 2015-02-03 NOTE — Patient Instructions (Signed)
If you do undergo surgery, I would recommend continuing Nucynta 75 mg 5 times per day. Your orthopedic surgeon may prescribe whatever the usual postoperative medications they prescribed such as hydrocodone or oxycodone for 2-4 weeks postoperatively.

## 2015-02-03 NOTE — Progress Notes (Signed)
Subjective:    Patient ID: Sabrina Williams, female    DOB: November 09, 1970, 44 y.o.   MRN: [id number]  HPI Chief complaint right shoulder pain  Has followed up with an orthopedic surgeon who referred her to another orthopedic surgeon after Hill-Sachs lesion was diagnosed. She has an appointment tomorrow. Deciding on whether surgery is needed.  Patient also complaining of right foot pain , Between the base of the second and third toes.Plans to follow-up with podiatry on this. RSD symptoms right foot overall improved compared to previous Nucynta has been very helpful for her symptoms. She was doing quite well and we attempted to reduce her dose from 70 50 g to 50 however she had an exacerbation of her pain after that. She did continue the 50 mg dose for a whole month but went back to the 75 mg dose after visiting with the nurse practitioner.  Chronic neck and left shoulder and left arm pain, history of postlaminectomy syndrome  Pain Inventory Average Pain 5 Pain Right Now 4 My pain is constant, sharp, burning and tingling  In the last 24 hours, has pain interfered with the following? General activity 7 Relation with others 3 Enjoyment of life 5 What TIME of day is your pain at its worst? evening Sleep (in general) Fair  Pain is worse with: walking, standing and some activites Pain improves with: rest and medication Relief from Meds: 7  Mobility walk without assistance how many minutes can you walk? 15 ability to climb steps?  yes do you drive?  yes Do you have any goals in this area?  yes  Function not employed: date last employed . I need assistance with the following:  meal prep, household duties and shopping  Neuro/Psych No problems in this area  Prior Studies Any changes since last visit?  no  Physicians involved in your care Any changes since last visit?  no   Family History  Problem Relation Age of Onset  . Cancer Mother     multiple myeloma  . Diabetes  Father   . Kidney disease Father   . Heart disease Father    Social History   Social History  . Marital Status: Married    Spouse Name: N/A  . Number of Children: N/A  . Years of Education: N/A   Social History Main Topics  . Smoking status: Never Smoker   . Smokeless tobacco: Never Used  . Alcohol Use: No  . Drug Use: No  . Sexual Activity: Not Asked   Other Topics Concern  . None   Social History Narrative   Past Surgical History  Procedure Laterality Date  . Small intestine surgery      cervical laminectomy  . Utereroscopy  1995  . Lithotripsy      multiple  . Shoulder surgery  6/06  . Cesarean section      x2  . Endoscopic plantar fasciotomy Right August 2015  . Dental implant  July 2015   Past Medical History  Diagnosis Date  . Neuromuscular disorder   . Diabetes mellitus   . Depression   . Hypertension   . Chronic kidney disease     stones   BP 135/79 mmHg  Pulse 88  Resp 14  SpO2 99%  Opioid Risk Score:   Fall Risk Score:  `1  Depression screen PHQ 2/9  Depression screen [facility] 2/9 01/01/2015 08/12/2014  Decreased Interest 1 1  Down, Depressed, Hopeless 1 1  PHQ - 2 Score  2 2  Altered sleeping - 1  Tired, decreased energy - 3  Change in appetite - 2  Feeling bad or failure about yourself  - 3  Trouble concentrating - 2  Moving slowly or fidgety/restless - 1  Suicidal thoughts - 0  PHQ-9 Score - 14     Review of Systems  All other systems reviewed and are negative.      Objective:   Physical Exam  Constitutional: She is oriented to person, place, and time. She appears well-developed and well-nourished.  Morbid obesity  HENT:  Head: Normocephalic and atraumatic.  Eyes: Conjunctivae and EOM are normal. Pupils are equal, round, and reactive to light.  Neck: Normal range of motion.  Musculoskeletal: Normal range of motion.  Neurological: She is alert and oriented to person, place, and time. She has normal reflexes. A sensory deficit is  present.  Left C6 sensory deficit  Psychiatric: She has a normal mood and affect.  Nursing note and vitals reviewed.  Shoulder range of motion is Mildly reduced in right internal and external rotation however she is able to reach behind her back touch the back of her head as well as  Lower lumbar area  Motor strength is 4/5 and left deltoid, biceps, triceps, grip 4/5 in the right deltoid bicep tricep limited by pain and apprehension 5/5 in the right grip. Lower extremity strength is normal She is tenderness palpation between the metatarsal heads of digits 1 and 2 as well as 2 and 3 in the right foot. There is minimal swelling in the area between digits 2 and 3 no erythema. No tenderness along the Achilles tendon or in the plantar fascia area does have some pain with ankle range of motion          Assessment & Plan:  1.  Plantar fasciitis with surgery- January 15, 2014 with post op CRPS 1, Her symptoms have improved to a great degree with treatment which has included PT, oral medications as well as 2 lumbar Sympathetic injections. At this point I no further injections are needed.We will also try to reduce Nucynta  Tried to  Reduce Nucynta 50 mg 5 times per day , now  is back on Nucynta $RemoveBe'75mg'[id number]$  , At this point will continue on the current dose of Nucynta. We'll follow up with nurse practitioner next month. If the patient has surgery, orthopedic surgery can prescribe pain medicines they can continue the Nucynta 75 mg 5 times a day and then add on their usual postoperative medications to that  2. Cervical postlaminectomy syndrome with chronic C6 radiculitis, Continue gabapentin 600 mg 4 times a day, Continue Zanaflex 4 mg 4 times a day    3.  Right shoulder pain, hx of dislocation,orthopedic evaluation,Diagnosed with a Hill-Sachs lesion,Follow-up with orthopedic surgery on this. If patient undergoes surgery the surgeon will need to prescribe pain medicine for the first month after surgery. 4.   Naming problems-episodic, also with short term  Memory loss , Since a medication-related effect, Patient feels this is most pronounced couple hours after Nucynta dose  5. Foot pain she has various types of foot pain including base of the toes between the first and second as well as second and third toes on the right foot. In addition she has some plantar fascia type pain as well as Achilles tendon pain. She'll follow up with the tight wrist on this.  Over half of the 25 min visit was spent counseling and coordinating care.We discussed her various pains  and the treatment recommended for each.  Overall the goal is to reduce narcotic analgesics however this will not occur in the short-term. She is possibly undergoing right shoulder surgery

## 2015-02-15 ENCOUNTER — Other Ambulatory Visit: Payer: Self-pay | Admitting: Physical Medicine & Rehabilitation

## 2015-03-03 ENCOUNTER — Encounter: Payer: BLUE CROSS/BLUE SHIELD | Admitting: Registered Nurse

## 2015-03-10 ENCOUNTER — Encounter: Payer: Self-pay | Admitting: Registered Nurse

## 2015-03-10 ENCOUNTER — Encounter: Payer: BLUE CROSS/BLUE SHIELD | Attending: Physical Medicine & Rehabilitation | Admitting: Registered Nurse

## 2015-03-10 VITALS — BP 144/84 | HR 94 | Resp 16

## 2015-03-10 DIAGNOSIS — M961 Postlaminectomy syndrome, not elsewhere classified: Secondary | ICD-10-CM | POA: Diagnosis present

## 2015-03-10 DIAGNOSIS — M25512 Pain in left shoulder: Secondary | ICD-10-CM

## 2015-03-10 DIAGNOSIS — G90521 Complex regional pain syndrome I of right lower limb: Secondary | ICD-10-CM | POA: Diagnosis not present

## 2015-03-10 DIAGNOSIS — M25511 Pain in right shoulder: Secondary | ICD-10-CM

## 2015-03-10 DIAGNOSIS — Z5181 Encounter for therapeutic drug level monitoring: Secondary | ICD-10-CM | POA: Diagnosis not present

## 2015-03-10 DIAGNOSIS — Z79899 Other long term (current) drug therapy: Secondary | ICD-10-CM

## 2015-03-10 DIAGNOSIS — M5412 Radiculopathy, cervical region: Secondary | ICD-10-CM | POA: Insufficient documentation

## 2015-03-10 DIAGNOSIS — M797 Fibromyalgia: Secondary | ICD-10-CM | POA: Diagnosis not present

## 2015-03-10 DIAGNOSIS — M7918 Myalgia, other site: Secondary | ICD-10-CM

## 2015-03-10 MED ORDER — TAPENTADOL HCL 75 MG PO TABS: 75.0000 mg | ORAL_TABLET | Freq: Every day | ORAL | Status: DC

## 2015-03-10 NOTE — Progress Notes (Signed)
Subjective:    Patient ID: Sabrina Williams, female    DOB: 23-Feb-1971, 44 y.o.   MRN: [id number]  HPI: [name] is a 44 year old female who returns for follow up for chronic pain and medication refill. She says her pain is located in her bilateral  Shoulder's, right knee and right foot. She rates her pain 4. Her current exercise regime is performing chair exercises and walking short distances.   Pain Inventory Average Pain 4 Pain Right Now 4 My pain is constant, sharp, burning, dull, stabbing, tingling and aching  In the last 24 hours, has pain interfered with the following? General activity 7 Relation with others 5 Enjoyment of life 4 What TIME of day is your pain at its worst? evening Sleep (in general) Good  Pain is worse with: walking, standing and some activites Pain improves with: rest, heat/ice and medication Relief from Meds: 7  Mobility walk without assistance how many minutes can you walk? 15-20 ability to climb steps?  yes do you drive?  yes Do you have any goals in this area?  yes  Function not employed: date last employed NA  Neuro/Psych No problems in this area  Prior Studies Any changes since last visit?  no  Physicians involved in your care Any changes since last visit?  no   Family History  Problem Relation Age of Onset  . Cancer Mother     multiple myeloma  . Diabetes Father   . Kidney disease Father   . Heart disease Father    Social History   Social History  . Marital Status: Married    Spouse Name: N/A  . Number of Children: N/A  . Years of Education: N/A   Social History Main Topics  . Smoking status: Never Smoker   . Smokeless tobacco: Never Used  . Alcohol Use: No  . Drug Use: No  . Sexual Activity: Not Asked   Other Topics Concern  . None   Social History Narrative   Past Surgical History  Procedure Laterality Date  . Small intestine surgery      cervical laminectomy  . Utereroscopy  1995  .  Lithotripsy      multiple  . Shoulder surgery  6/06  . Cesarean section      x2  . Endoscopic plantar fasciotomy Right August 2015  . Dental implant  July 2015   Past Medical History  Diagnosis Date  . Neuromuscular disorder (HCC)   . Diabetes mellitus   . Depression   . Hypertension   . Chronic kidney disease     stones   BP 144/84 mmHg  Pulse 94  Resp 16  SpO2 95%  Opioid Risk Score:   Fall Risk Score:  `1  Depression screen PHQ 2/9  Depression screen [facility] 2/9 01/01/2015 08/12/2014  Decreased Interest 1 1  Down, Depressed, Hopeless 1 1  PHQ - 2 Score 2 2  Altered sleeping - 1  Tired, decreased energy - 3  Change in appetite - 2  Feeling bad or failure about yourself  - 3  Trouble concentrating - 2  Moving slowly or fidgety/restless - 1  Suicidal thoughts - 0  PHQ-9 Score - 14     Review of Systems  All other systems reviewed and are negative.      Objective:   Physical Exam  Constitutional: She is oriented to person, place, and time. She appears well-developed and well-nourished.  HENT:  Head: Normocephalic and  atraumatic.  Neck: Normal range of motion. Neck supple.  Cardiovascular: Normal rate and regular rhythm.   Pulmonary/Chest: Effort normal and breath sounds normal.  Musculoskeletal:  Normal Muscle Bulk and Muscle Testing Reveals: Upper Extremities: Full ROM and Muscle Strength 5/5 Lower Extremities: Full ROM and Muscle Strength 5/5 Arises from chair slowly Narrow Based gait  Neurological: She is alert and oriented to person, place, and time.  Skin: Skin is warm and dry.  Psychiatric: She has a normal mood and affect.  Nursing note and vitals reviewed.         Assessment & Plan:  1. Complex regional pain syndrome right lower extremity postoperative. She's s/p post plantar fascial release. She has sensitivity touch. She has diffuse numbness and tingling. Refilled: Nucynta 75 mg one tablet five times a day. #150. 2.Myofascial pain  syndrome: Continue with voltaren gel, heat and exercise regime.  3. Depression: Continue: Zoloft  20 minutes of face to face patient care time was spent during this visit. All questions was encouraged and answered.  F/U in 1 month

## 2015-03-29 ENCOUNTER — Other Ambulatory Visit: Payer: Self-pay | Admitting: Registered Nurse

## 2015-04-02 ENCOUNTER — Ambulatory Visit: Payer: BLUE CROSS/BLUE SHIELD | Attending: Orthopedic Surgery | Admitting: Physical Therapy

## 2015-04-02 ENCOUNTER — Encounter: Payer: Self-pay | Admitting: Physical Therapy

## 2015-04-02 DIAGNOSIS — M6281 Muscle weakness (generalized): Secondary | ICD-10-CM | POA: Diagnosis present

## 2015-04-02 DIAGNOSIS — M25619 Stiffness of unspecified shoulder, not elsewhere classified: Secondary | ICD-10-CM

## 2015-04-02 DIAGNOSIS — M7580 Other shoulder lesions, unspecified shoulder: Secondary | ICD-10-CM | POA: Diagnosis present

## 2015-04-02 DIAGNOSIS — M25511 Pain in right shoulder: Secondary | ICD-10-CM | POA: Diagnosis present

## 2015-04-03 ENCOUNTER — Encounter: Payer: Self-pay | Admitting: Physical Therapy

## 2015-04-03 NOTE — Therapy (Signed)
[facility] [address] [place], [place], [address] Phone: [phone]   Fax:  [phone]  Physical Therapy Evaluation  Patient Details  Name: Sabrina Williams MRN: [id number] Date of Birth: Mar 23, 1971 Referring Provider: Carney Bern, MD  Encounter Date: 04/02/2015      PT End of Session - 04/03/15 1141    Visit Number 1   Number of Visits 8   Date for PT Re-Evaluation 04/30/15   PT Start Time 1110   PT Stop Time 1200   PT Time Calculation (min) 50 min   Activity Tolerance Patient limited by pain   Behavior During Therapy [facility] for tasks assessed/performed  fearful of pain with movement      Past Medical History  Diagnosis Date  . Neuromuscular disorder ([place])   . Diabetes mellitus   . Depression   . Hypertension   . Chronic kidney disease     stones    Past Surgical History  Procedure Laterality Date  . Small intestine surgery      cervical laminectomy  . Utereroscopy  1995  . Lithotripsy      multiple  . Shoulder surgery  6/06  . Cesarean section      x2  . Endoscopic plantar fasciotomy Right August 2015  . Dental implant  July 2015    There were no vitals filed for this visit.  Visit Diagnosis:  Pain in joint of right shoulder  Decreased range of motion (ROM) of shoulder  Muscle weakness      Subjective Assessment - 04/02/15 1138    Subjective Pt reports shoulder injury from 18 years ago while Christmas shopping. Pt reports R shoulder occassionally popping out of place but normally she is able to pop it back into place. Pt has intermittent pain and reports pain upon arrival to PT at 2/10. Pt states that she has found relief from heat and shoulder pain is aggrevated by her bra strap and the passenger seat belt. Pt reports having difficulty getting up in the mornings out of her Casper mattress. Pt reports L shoulder pain and damage from a previous incident with C6/C7 nerve damage.    Limitations  Lifting;Walking;House hold activities   Diagnostic tests XRAY   Patient Stated Goals pain free Stamping/return to knitting/return to drive long distances    Currently in Pain? Yes   Pain Score 2    Pain Location Shoulder   Pain Orientation Right   Pain Descriptors / Indicators Tender;Tightness;Pressure;Pounding;Sharp;Shooting;Cramping;Discomfort;Dull;Aching   Pain Type Chronic pain   Pain Onset More than a month ago   Pain Frequency Intermittent   Aggravating Factors  passenger seat belt/sleeping/overhead reaching   Pain Relieving Factors heat             [facility] PT Assessment - 04/03/15 0001    Assessment   Medical Diagnosis R labral tear of shoulder   Referring Provider Carney Bern, MD   Onset Date/Surgical Date 05/24/96   Hand Dominance Right   Next MD Visit 05/26/2015   Prior Therapy off and on 10 years Nicole Kindred      OBJECTIVE: Quick DASH: 55% MMT: L 4/5, R unable to assess due to pain Shoulder AROM in sitting: L flexion 151 deg, L abduction: 163 deg, R flexion pain limited at 147 deg. R abduction unable to assess due to pain. Manual: PROM to R shoulder in abduction and scaption. Pt reports an increase in radiating pain with motions and unable to tolerate more than 5  stretches each direction. STM to R anterior deltoid, upper trap, levator and supraspinatus. Tenderness to palpation and trigger points noted. STM to R levator and upper trap with pt self stretching (issued stretches as HEP).           PT Education - 04/03/15 1140    Education provided Yes   Education Details Pt given upper trap, levator and pec stretching to do at home. Pt given isometrics in flexion/extension/IR/ER for strengthening today.   Person(s) Educated Patient   Methods Explanation;Verbal cues;Handout;Demonstration   Comprehension Returned demonstration;Verbalized understanding             PT Long Term Goals - 04/03/15 1150    PT LONG TERM GOAL #1   Title Pt will decrease Quick DASH  score to < 30% in order to increase R UE functional mobility.   Baseline 55% on 11/09   Time 4   Period Weeks   Status New   PT LONG TERM GOAL #2   Title Pt will increase R shoulder AROM in pain free range to report driving on interstate with pain no greater than 2/10.   Baseline pt currently only able to drive around town without pain   Time 4   Period Weeks   Status New   PT LONG TERM GOAL #3   Title Pt will be independent with HEP in order to increase R shoulder strength by 1/2 MMT to return to knitting.   Baseline pt is currently not knitting due to R shoulder pain.   Time 4   Period Weeks   Status New   PT LONG TERM GOAL #4   Title Pt will demonstrate a decrease in tenderness to palpation of R scapular stabilization musculature in order to report a return to pain free stamping.   Baseline pt can only perform 2 stamping activities before having to stop   Time 4   Period Weeks   Status New               Plan - 04/03/15 1142    Clinical Impression Statement Pt is a 44 y.o F referred to PT with chronic R shoulder pain. Pt reports her pain is a 2/10 upon arrival to PT. Pt reports tenderness to palpation at R anterior deltoid insertion, R supraspinatus and R upper trap and levator; trigger points noted in all musculature groups. Pt's MMT is grossly a 4/5 L UE and R side is pain limited. Pt's AROM in sitting is R flexion limited by pain at 147 deg, L flexion 151 deg and L abduction 163 deg. Pt R UE unable to assess AROM with abduction due to incrased pain. Pt is able to tolerate isometrics with minimal resistance. Pt is able to tolerate self stretching without increased complaints of symptoms. Unable to assess R shoulder joint mobility due to increased pain. Pt found relief from heat in sitting position after a reported incresae in pain. Quick Dash: 55%. Pt will benefit from short term skilled PT to address deficits and increase R UE AROM and R shoulder capsule mobilty.    Pt will  benefit from skilled therapeutic intervention in order to improve on the following deficits Decreased range of motion;Pain;Obesity;Decreased endurance;Impaired flexibility;Improper body mechanics;Postural dysfunction;Decreased strength;Decreased mobility;Hypomobility   Rehab Potential Fair   PT Frequency 2x / week   PT Duration 4 weeks   PT Treatment/Interventions ADLs/Self Care Home Management;Cryotherapy;Moist Heat;Electrical Stimulation;Therapeutic exercise;Balance training;Therapeutic activities;Functional mobility training;Stair training;Gait training;Patient/family education;Neuromuscular re-education;Dry needling;Energy conservation;Passive range of motion;Manual techniques  PT Next Visit Plan Assess R shoulder abduction/PROM/supine joint mobility/supine strengthening/discuss pool PT as an option   PT Home Exercise Plan see handout   Consulted and Agree with Plan of Care Patient         Problem List Patient Active Problem List   Diagnosis Date Noted  . Right shoulder pain 12/05/2014  . Complex regional pain syndrome I of lower limb 07/05/2014  . Cervical postlaminectomy syndrome 09/07/2011  . Cervical radiculopathy at C6 09/07/2011    Lavone Neri, SPT 04/03/2015, 11:53 AM  [facility] [address] [place], [place], [address] Phone: [phone]   Fax:  [phone]  Name: Sabrina Williams MRN: [id number] Date of Birth: [birth year]

## 2015-04-08 ENCOUNTER — Encounter: Payer: BLUE CROSS/BLUE SHIELD | Admitting: Physical Therapy

## 2015-04-10 ENCOUNTER — Ambulatory Visit: Payer: BLUE CROSS/BLUE SHIELD | Admitting: Physical Therapy

## 2015-04-10 ENCOUNTER — Encounter: Payer: Self-pay | Admitting: Physical Therapy

## 2015-04-10 DIAGNOSIS — M25511 Pain in right shoulder: Secondary | ICD-10-CM

## 2015-04-10 DIAGNOSIS — M6281 Muscle weakness (generalized): Secondary | ICD-10-CM

## 2015-04-10 DIAGNOSIS — M25619 Stiffness of unspecified shoulder, not elsewhere classified: Secondary | ICD-10-CM

## 2015-04-10 NOTE — Therapy (Signed)
[facility] [address] [place], [place], [address] Phone: [phone]   Fax:  [phone]  Physical Therapy Treatment  Patient Details  Name: Sabrina Williams MRN: [id number] Date of Birth: 1970-09-09 Referring Provider: Carney Bern, MD  Encounter Date: 04/10/2015      PT End of Session - 04/10/15 2148    Visit Number 2   Number of Visits 8   Date for PT Re-Evaluation 04/30/15   PT Start Time 1427   PT Stop Time [id number]   PT Time Calculation (min) 48 min   Activity Tolerance Patient limited by pain;Patient tolerated treatment well   Behavior During Therapy [facility] for tasks assessed/performed  fearful of pain with movement      Past Medical History  Diagnosis Date  . Neuromuscular disorder ([place])   . Diabetes mellitus   . Depression   . Hypertension   . Chronic kidney disease     stones    Past Surgical History  Procedure Laterality Date  . Small intestine surgery      cervical laminectomy  . Utereroscopy  1995  . Lithotripsy      multiple  . Shoulder surgery  6/06  . Cesarean section      x2  . Endoscopic plantar fasciotomy Right August 2015  . Dental implant  July 2015    There were no vitals filed for this visit.  Visit Diagnosis:  Pain in joint of right shoulder  Decreased range of motion (ROM) of shoulder  Muscle weakness      Subjective Assessment - 04/10/15 2147    Subjective Pt reports increased R shoulder stiffness over the past couple of days. Pt reports headache (reason for cancellation last visit) is moderately better. Pt reports R shoulder pain at a 3/10.   Limitations Lifting;Walking;House hold activities   Diagnostic tests XRAY   Patient Stated Goals pain free Stamping/return to knitting/return to drive long distances    Pain Score 3    Pain Location Shoulder   Pain Orientation Right   Pain Descriptors / Indicators Aching;Discomfort   Pain Type Chronic pain   Pain Onset More than a  month ago      OBJECTIVE: Manual: STM to R upper trap and cervical paraspinals in supine. R UT stretching with increased symptoms on the L side. Cervical PROM is WNL. Grade II AP joint mobilizations on the [facility] joint 4 x 20 seconds. Pt reports feeling pain when mobilizations end and pressure is off of her arm. There ex: Supine: serratus punches x 30 with 2# weight. Rhythmic stabilizations in supine in all planes of motion for 3 minutes. Pt is able to hold her arm but has increased reported symptoms with inferior pushing. Pt tolerated well and with no compensations noted. Lat pull down with 30# on the nautilus x 15.   Standing with yellow theraband: Biceps/triceps/shoulder extension/scapular retraction/horizontal abduction/star pattern diagonals x 15 each. Heat to R shoulder in sitting to end the session.  Pt response to tx for medical necessity: pt tolerate there ex to strengthen scapular stabilization musculature without increased complaints of symptoms. Pt benefits from manual to increase posterior joint capsule.        PT Education - 04/10/15 2150    Education provided Yes   Education Details Pt given yellow band strengthening to add into stretching: scapular retraction/bicpes/triceps/shoulder extnesion/horizontal abduction/diagonals   Person(s) Educated Patient   Methods Explanation;Demonstration;Verbal cues;Handout;Tactile cues   Comprehension Returned demonstration;Verbalized understanding  PT Long Term Goals - 04/03/15 1150    PT LONG TERM GOAL #1   Title Pt will decrease Quick DASH score to < 30% in order to increase R UE functional mobility.   Baseline 55% on 11/09   Time 4   Period Weeks   Status New   PT LONG TERM GOAL #2   Title Pt will increase R shoulder AROM in pain free range to report driving on interstate with pain no greater than 2/10.   Baseline pt currently only able to drive around town without pain   Time 4   Period Weeks   Status New   PT LONG  TERM GOAL #3   Title Pt will be independent with HEP in order to increase R shoulder strength by 1/2 MMT to return to knitting.   Baseline pt is currently not knitting due to R shoulder pain.   Time 4   Period Weeks   Status New   PT LONG TERM GOAL #4   Title Pt will demonstrate a decrease in tenderness to palpation of R scapular stabilization musculature in order to report a return to pain free stamping.   Baseline pt can only perform 2 stamping activities before having to stop   Time 4   Period Weeks   Status New               Plan - 04/10/15 2148    Clinical Impression Statement Pt demonstrates good form with strengthening exercises but is limited by pain and muscle fatigue. Pt is hypsersensitive to joint mobilitizations and soft tissue mobilization. Pt is able to actively reach 90 degrees abduction in supine. Pt reports radiating pain from her posterior capsule. Pt demonstrates moderate posterior joint capsule tightness.    Pt will benefit from skilled therapeutic intervention in order to improve on the following deficits Decreased range of motion;Pain;Obesity;Decreased endurance;Impaired flexibility;Improper body mechanics;Postural dysfunction;Decreased strength;Decreased mobility;Hypomobility   Rehab Potential Fair   PT Frequency 2x / week   PT Duration 4 weeks   PT Treatment/Interventions ADLs/Self Care Home Management;Cryotherapy;Moist Heat;Electrical Stimulation;Therapeutic exercise;Balance training;Therapeutic activities;Functional mobility training;Stair training;Gait training;Patient/family education;Neuromuscular re-education;Dry needling;Energy conservation;Passive range of motion;Manual techniques   PT Next Visit Plan Assess R shoulder abduction/PROM/supine joint mobility/supine strengthening/discuss pool PT as an option   PT Home Exercise Plan see handout   Consulted and Agree with Plan of Care Patient        Problem List Patient Active Problem List   Diagnosis  Date Noted  . Right shoulder pain 12/05/2014  . Complex regional pain syndrome I of lower limb 07/05/2014  . Cervical postlaminectomy syndrome 09/07/2011  . Cervical radiculopathy at C6 09/07/2011    Lavone Neri, SPT 04/10/2015, 9:52 PM  [place] [facility] [address] [place], [place], [address] Phone: [phone]   Fax:  [phone]  Name: Sabrina Williams MRN: [id number] Date of Birth: 1971-02-09

## 2015-04-14 ENCOUNTER — Encounter: Payer: BLUE CROSS/BLUE SHIELD | Admitting: Physical Therapy

## 2015-04-16 ENCOUNTER — Encounter: Payer: Self-pay | Admitting: Physical Therapy

## 2015-04-16 ENCOUNTER — Ambulatory Visit: Payer: BLUE CROSS/BLUE SHIELD | Admitting: Physical Therapy

## 2015-04-16 DIAGNOSIS — M25619 Stiffness of unspecified shoulder, not elsewhere classified: Secondary | ICD-10-CM

## 2015-04-16 DIAGNOSIS — M25511 Pain in right shoulder: Secondary | ICD-10-CM

## 2015-04-16 DIAGNOSIS — M6281 Muscle weakness (generalized): Secondary | ICD-10-CM

## 2015-04-16 NOTE — Therapy (Signed)
[facility] [address] [place], [place], [address] Phone: [phone]   Fax:  [phone]  Physical Therapy Treatment  Patient Details  Name: Sabrina Williams MRN: [id number] Date of Birth: 07-26-1970 Referring Provider: Carney Bern, MD  Encounter Date: 04/16/2015      PT End of Session - 04/16/15 1722    Visit Number 3   Number of Visits 8   Date for PT Re-Evaluation 04/30/15   PT Start Time 1435   PT Stop Time 1525   PT Time Calculation (min) 50 min   Activity Tolerance Patient tolerated treatment well;No increased pain   Behavior During Therapy [facility] for tasks assessed/performed  fearful of pain with movement      Past Medical History  Diagnosis Date  . Neuromuscular disorder ([place])   . Diabetes mellitus   . Depression   . Hypertension   . Chronic kidney disease     stones    Past Surgical History  Procedure Laterality Date  . Small intestine surgery      cervical laminectomy  . Utereroscopy  1995  . Lithotripsy      multiple  . Shoulder surgery  6/06  . Cesarean section      x2  . Endoscopic plantar fasciotomy Right August 2015  . Dental implant  July 2015    There were no vitals filed for this visit.  Visit Diagnosis:  Pain in joint of right shoulder  Decreased range of motion (ROM) of shoulder  Muscle weakness      Subjective Assessment - 04/16/15 1721    Subjective Pt reports shoulder is feeling better than Monday when she had to cancel her appt due to pain. Pt states that ice and massage have been helpful in calming shoulder pain down.   Limitations Lifting;Walking;House hold activities   Diagnostic tests XRAY   Patient Stated Goals pain free Stamping/return to knitting/return to drive long distances    Currently in Pain? Yes   Pain Score 3    Pain Location Shoulder   Pain Orientation Right   Pain Type Chronic pain   Pain Onset More than a month ago       OBJECTIVE: Manual: In  sitting, soft tissue mobilization to R upper trap and rhomboids with palpable trigger points. Relief found from ischemic compression. In supine, grade III mobilizations in the AP direction for the [facility] joint capsule 8 x 30 seconds. Pt positioned in abduction and flexion per her tolerance. PROM to R shoulder in ER/flexion/abduction x 10 each direction with a 3 second hold. Attempted there ex but pt had increase in shooting sensation so ended. Ice to pt's L shoulder in sitting to end the session.  Pt response to tx for medical necessity: Pt benefits from strengthening other musculature to maintain shoulder joint. Pt is demonstrating a carryover in motion from previous session.         PT Long Term Goals - 04/03/15 1150    PT LONG TERM GOAL #1   Title Pt will decrease Quick DASH score to < 30% in order to increase R UE functional mobility.   Baseline 55% on 11/09   Time 4   Period Weeks   Status New   PT LONG TERM GOAL #2   Title Pt will increase R shoulder AROM in pain free range to report driving on interstate with pain no greater than 2/10.   Baseline pt currently only able to drive around town without pain  Time 4   Period Weeks   Status New   PT LONG TERM GOAL #3   Title Pt will be independent with HEP in order to increase R shoulder strength by 1/2 MMT to return to knitting.   Baseline pt is currently not knitting due to R shoulder pain.   Time 4   Period Weeks   Status New   PT LONG TERM GOAL #4   Title Pt will demonstrate a decrease in tenderness to palpation of R scapular stabilization musculature in order to report a return to pain free stamping.   Baseline pt can only perform 2 stamping activities before having to stop   Time 4   Period Weeks   Status New             Plan - 04/16/15 1722    Clinical Impression Statement Pt is pain limited with strengthening exericses due to increased burning sensation felt. Pt demostrate increased tolerance to manual therapy as  compared to previous sessions. Pt finds relief from trigger point release in R upper trap muscle. Pt is hypomobile in the R posterior capsule.    Pt will benefit from skilled therapeutic intervention in order to improve on the following deficits Decreased range of motion;Pain;Obesity;Decreased endurance;Impaired flexibility;Improper body mechanics;Postural dysfunction;Decreased strength;Decreased mobility;Hypomobility   Rehab Potential Fair   PT Frequency 2x / week   PT Duration 4 weeks   PT Treatment/Interventions ADLs/Self Care Home Management;Cryotherapy;Moist Heat;Electrical Stimulation;Therapeutic exercise;Balance training;Therapeutic activities;Functional mobility training;Stair training;Gait training;Patient/family education;Neuromuscular re-education;Dry needling;Energy conservation;Passive range of motion;Manual techniques   PT Next Visit Plan Assess R shoulder abduction/PROM/supine joint mobility/supine strengthening/discuss pool PT as an option   PT Home Exercise Plan see handout   Consulted and Agree with Plan of Care Patient        Problem List Patient Active Problem List   Diagnosis Date Noted  . Right shoulder pain 12/05/2014  . Complex regional pain syndrome I of lower limb 07/05/2014  . Cervical postlaminectomy syndrome 09/07/2011  . Cervical radiculopathy at C6 09/07/2011   Pura Spice, PT, DPT # [id number]   04/16/2015, 5:24 PM  [facility] [address] [place], [place], [address] Phone: [phone]   Fax:  [phone]  Name: Sabrina Williams MRN: [id number] Date of Birth: 1971-05-10

## 2015-04-21 ENCOUNTER — Encounter: Payer: BLUE CROSS/BLUE SHIELD | Attending: Physical Medicine & Rehabilitation | Admitting: Registered Nurse

## 2015-04-21 ENCOUNTER — Encounter: Payer: Self-pay | Admitting: Registered Nurse

## 2015-04-21 VITALS — BP 116/62 | HR 98 | Resp 14

## 2015-04-21 DIAGNOSIS — M25511 Pain in right shoulder: Secondary | ICD-10-CM | POA: Diagnosis not present

## 2015-04-21 DIAGNOSIS — M7918 Myalgia, other site: Secondary | ICD-10-CM

## 2015-04-21 DIAGNOSIS — G90521 Complex regional pain syndrome I of right lower limb: Secondary | ICD-10-CM | POA: Diagnosis not present

## 2015-04-21 DIAGNOSIS — M961 Postlaminectomy syndrome, not elsewhere classified: Secondary | ICD-10-CM | POA: Diagnosis present

## 2015-04-21 DIAGNOSIS — M25512 Pain in left shoulder: Secondary | ICD-10-CM

## 2015-04-21 DIAGNOSIS — Z79899 Other long term (current) drug therapy: Secondary | ICD-10-CM

## 2015-04-21 DIAGNOSIS — Z5181 Encounter for therapeutic drug level monitoring: Secondary | ICD-10-CM | POA: Diagnosis not present

## 2015-04-21 DIAGNOSIS — M797 Fibromyalgia: Secondary | ICD-10-CM | POA: Insufficient documentation

## 2015-04-21 DIAGNOSIS — M5412 Radiculopathy, cervical region: Secondary | ICD-10-CM | POA: Insufficient documentation

## 2015-04-21 MED ORDER — TAPENTADOL HCL 75 MG PO TABS: 75.0000 mg | ORAL_TABLET | Freq: Every day | ORAL | Status: DC

## 2015-04-21 NOTE — Progress Notes (Signed)
Subjective:    Patient ID: Sabrina Williams, female    DOB: 22-Dec-1970, 44 y.o.   MRN: [id number]  HPI: Sabrina Williams is a 44 year old female who returns for follow up for chronic pain and medication refill. She says her pain is located in her bilateral shoulder's, right knee and right foot. Also states she's receiving physical therapy on right arm. She rates her pain 4. Her current exercise regime is performing chair exercises and walking short distances.   Pain Inventory Average Pain 5 Pain Right Now 4 My pain is constant, sharp, burning, dull, stabbing, tingling and aching  In the last 24 hours, has pain interfered with the following? General activity 7 Relation with others 3 Enjoyment of life 4 What TIME of day is your pain at its worst? evening Sleep (in general) Good  Pain is worse with: walking, standing and some activites Pain improves with: rest, heat/ice, therapy/exercise and medication Relief from Meds: 7  Mobility walk without assistance how many minutes can you walk? 15-20 Do you have any goals in this area?  yes  Function not employed: date last employed na Do you have any goals in this area?  no  Neuro/Psych weakness numbness tingling trouble walking depression anxiety  Prior Studies Any changes since last visit?  no  Physicians involved in your care Any changes since last visit?  no   Family History  Problem Relation Age of Onset  . Cancer Mother     multiple myeloma  . Diabetes Father   . Kidney disease Father   . Heart disease Father    Social History   Social History  . Marital Status: Married    Spouse Name: N/A  . Number of Children: N/A  . Years of Education: N/A   Social History Main Topics  . Smoking status: Never Smoker   . Smokeless tobacco: Never Used  . Alcohol Use: No  . Drug Use: No  . Sexual Activity: Not Asked   Other Topics Concern  . None   Social History Narrative   Past Surgical History    Procedure Laterality Date  . Small intestine surgery      cervical laminectomy  . Utereroscopy  1995  . Lithotripsy      multiple  . Shoulder surgery  6/06  . Cesarean section      x2  . Endoscopic plantar fasciotomy Right August 2015  . Dental implant  July 2015   Past Medical History  Diagnosis Date  . Neuromuscular disorder ([place])   . Diabetes mellitus   . Depression   . Hypertension   . Chronic kidney disease     stones   BP 116/62 mmHg  Pulse 98  Resp 14  SpO2 96%  LMP  (LMP Unknown)  Opioid Risk Score:   Fall Risk Score:  `1  Depression screen PHQ 2/9  Depression screen [facility] 2/9 01/01/2015 08/12/2014  Decreased Interest 1 1  Down, Depressed, Hopeless 1 1  PHQ - 2 Score 2 2  Altered sleeping - 1  Tired, decreased energy - 3  Change in appetite - 2  Feeling bad or failure about yourself  - 3  Trouble concentrating - 2  Moving slowly or fidgety/restless - 1  Suicidal thoughts - 0  PHQ-9 Score - 14    Review of Systems  Constitutional: Positive for diaphoresis.  Endocrine:       High Blood Sugar  Musculoskeletal: Positive for gait problem.  Neurological:  Positive for weakness and numbness.       Tingling  Psychiatric/Behavioral: Positive for dysphoric mood. The patient is nervous/anxious.   All other systems reviewed and are negative.      Objective:   Physical Exam  Constitutional: She is oriented to person, place, and time. She appears well-developed and well-nourished.  HENT:  Head: Normocephalic and atraumatic.  Neck: Normal range of motion. Neck supple.  Cardiovascular: Normal rate and regular rhythm.   Pulmonary/Chest: Effort normal and breath sounds normal.  Musculoskeletal:  Normal Muscle Bulk and Muscle Testing Reveals: Upper Extremities: Full ROM and Muscle Strength 5/5 Right Rhomboid Tenderness Left AC Joint Tenderness Lower Extremities: Full ROM and Muscle Strength 5/5 Arises from chair with ease Narrow Based gait  Neurological:  She is alert and oriented to person, place, and time.  Skin: Skin is warm and dry.  Psychiatric: She has a normal mood and affect.  Nursing note and vitals reviewed.         Assessment & Plan:  1. Complex regional pain syndrome right lower extremity postoperative. She's s/p post plantar fascial release. She has sensitivity touch. She has diffuse numbness and tingling. Refilled: Nucynta 75 mg one tablet five times a day. #150. 2.Myofascial pain syndrome: Continue with voltaren gel, heat and exercise regime.  3. Depression: Continue: Zoloft  20 minutes of face to face patient care time was spent during this visit. All questions was encouraged and answered.  F/U in 1 month

## 2015-04-22 ENCOUNTER — Ambulatory Visit: Payer: BLUE CROSS/BLUE SHIELD | Admitting: Physical Therapy

## 2015-04-22 ENCOUNTER — Encounter: Payer: Self-pay | Admitting: Physical Therapy

## 2015-04-22 DIAGNOSIS — M25511 Pain in right shoulder: Secondary | ICD-10-CM

## 2015-04-22 DIAGNOSIS — M6281 Muscle weakness (generalized): Secondary | ICD-10-CM

## 2015-04-22 DIAGNOSIS — M25619 Stiffness of unspecified shoulder, not elsewhere classified: Secondary | ICD-10-CM

## 2015-04-23 NOTE — Therapy (Signed)
[facility] [address] [place], [place], [address] Phone: [phone]   Fax:  [phone]  Physical Therapy Treatment  Patient Details  Name: Sabrina Williams MRN: [id number] Date of Birth: 1971-03-28 Referring Provider: Carney Bern, MD  Encounter Date: 04/22/2015      PT End of Session - 04/22/15 1508    Visit Number 4   Number of Visits 8   Date for PT Re-Evaluation 04/30/15   PT Start Time [id number]   PT Stop Time 1455   PT Time Calculation (min) 40 min   Activity Tolerance Patient tolerated treatment well;No increased pain   Behavior During Therapy [facility] for tasks assessed/performed  fearful of pain with movement      Past Medical History  Diagnosis Date  . Neuromuscular disorder ([place])   . Diabetes mellitus   . Depression   . Hypertension   . Chronic kidney disease     stones    Past Surgical History  Procedure Laterality Date  . Small intestine surgery      cervical laminectomy  . Utereroscopy  1995  . Lithotripsy      multiple  . Shoulder surgery  6/06  . Cesarean section      x2  . Endoscopic plantar fasciotomy Right August 2015  . Dental implant  July 2015    There were no vitals filed for this visit.  Visit Diagnosis:  Pain in joint of right shoulder  Decreased range of motion (ROM) of shoulder  Muscle weakness      Subjective Assessment - 04/22/15 1507    Subjective Pt reports tightness and soreness across upper back and down both arms.    Limitations Lifting;Walking;House hold activities   Diagnostic tests XRAY   Patient Stated Goals pain free Stamping/return to knitting/return to drive long distances    Currently in Pain? Yes   Pain Score 3    Pain Location Shoulder   Pain Orientation Right   Pain Type Chronic pain   Pain Onset More than a month ago       OBJECTIVE: Manual: Seated PROM to B shoulders and STM to B upper trap. Tightness in upper trap noted and minimal relief found  with seated stretching. In supine, B shoulder mobilizations grade II 4 x 30 seconds each for pain control. L AC joint grade II mobilizations to decrease sensitivity to touch. Supine neck PROM and UE PROM stretching. In L sidelying, scapular mobilizations to R shoulder, grade III 30 seconds x 5 all 4 planes. Pt found relief with lateral glide to scapula. Pt educated on tightness and ways to maintain activity level outside of PT.  Pt response to tx for medical necessity: Pt benefits from manual therapy and strengthening to promote shoulder stability and return to prior level of function.         PT Long Term Goals - 04/03/15 1150    PT LONG TERM GOAL #1   Title Pt will decrease Quick DASH score to < 30% in order to increase R UE functional mobility.   Baseline 55% on 11/09   Time 4   Period Weeks   Status New   PT LONG TERM GOAL #2   Title Pt will increase R shoulder AROM in pain free range to [place] interstate with pain no greater than 2/10.   Baseline pt currently only able to drive around town without pain   Time 4   Period Weeks   Status New  PT LONG TERM GOAL #3   Title Pt will be independent with HEP in order to increase R shoulder strength by 1/2 MMT to return to knitting.   Baseline pt is currently not knitting due to R shoulder pain.   Time 4   Period Weeks   Status New   PT LONG TERM GOAL #4   Title Pt will demonstrate a decrease in tenderness to palpation of R scapular stabilization musculature in order to report a return to pain free stamping.   Baseline pt can only perform 2 stamping activities before having to stop   Time 4   Period Weeks   Status New               Plan - 04/22/15 1509    Clinical Impression Statement Pt presents with thoracic paraspinals and upper trap/rhomboids and subscapularis tenderness to touch.    Pt will benefit from skilled therapeutic intervention in order to improve on the following deficits Decreased range of  motion;Pain;Obesity;Decreased endurance;Impaired flexibility;Improper body mechanics;Postural dysfunction;Decreased strength;Decreased mobility;Hypomobility   Rehab Potential Fair   PT Frequency 2x / week   PT Duration 4 weeks   PT Treatment/Interventions ADLs/Self Care Home Management;Cryotherapy;Moist Heat;Electrical Stimulation;Therapeutic exercise;Balance training;Therapeutic activities;Functional mobility training;Stair training;Gait training;Patient/family education;Neuromuscular re-education;Dry needling;Energy conservation;Passive range of motion;Manual techniques   PT Next Visit Plan Assess R shoulder abduction/PROM/supine joint mobility/supine strengthening/discuss pool PT as an option   PT Home Exercise Plan see handout   Consulted and Agree with Plan of Care Patient        Problem List Patient Active Problem List   Diagnosis Date Noted  . Right shoulder pain 12/05/2014  . Complex regional pain syndrome I of lower limb 07/05/2014  . Cervical postlaminectomy syndrome 09/07/2011  . Cervical radiculopathy at C6 09/07/2011    Lavone Neri, SPT 04/23/2015, 7:07 AM  [facility] [address] [place], [place], [address] Phone: [phone]   Fax:  [phone]  Name: Sabrina Williams MRN: [id number] Date of Birth: [birth year]

## 2015-04-24 ENCOUNTER — Encounter: Payer: BLUE CROSS/BLUE SHIELD | Admitting: Physical Therapy

## 2015-04-29 ENCOUNTER — Ambulatory Visit: Payer: BLUE CROSS/BLUE SHIELD | Attending: Orthopedic Surgery | Admitting: Physical Therapy

## 2015-04-29 DIAGNOSIS — M25619 Stiffness of unspecified shoulder, not elsewhere classified: Secondary | ICD-10-CM

## 2015-04-29 DIAGNOSIS — M7580 Other shoulder lesions, unspecified shoulder: Secondary | ICD-10-CM | POA: Diagnosis present

## 2015-04-29 DIAGNOSIS — M25511 Pain in right shoulder: Secondary | ICD-10-CM | POA: Insufficient documentation

## 2015-04-29 DIAGNOSIS — M6281 Muscle weakness (generalized): Secondary | ICD-10-CM | POA: Insufficient documentation

## 2015-04-29 NOTE — Therapy (Signed)
[facility] [address] [place], [place], [address] Phone: [phone]   Fax:  [phone]  Physical Therapy Treatment  Patient Details  Name: Sabrina Williams MRN: [id number] Date of Birth: Apr 02, 1971 Referring Provider: Carney Bern, MD  Encounter Date: 04/29/2015      PT End of Session - 04/30/15 1824    Visit Number 5   Number of Visits 8   Date for PT Re-Evaluation 05/27/15   PT Start Time 9753   PT Stop Time 1521   PT Time Calculation (min) 50 min   Activity Tolerance Patient tolerated treatment well;Patient limited by pain;Patient limited by fatigue   Behavior During Therapy [facility] for tasks assessed/performed      Past Medical History  Diagnosis Date  . Neuromuscular disorder ([place])   . Diabetes mellitus   . Depression   . Hypertension   . Chronic kidney disease     stones    Past Surgical History  Procedure Laterality Date  . Small intestine surgery      cervical laminectomy  . Utereroscopy  1995  . Lithotripsy      multiple  . Shoulder surgery  6/06  . Cesarean section      x2  . Endoscopic plantar fasciotomy Right August 2015  . Dental implant  July 2015    There were no vitals filed for this visit.  Visit Diagnosis:  Pain in joint of right shoulder  Decreased range of motion (ROM) of shoulder  Muscle weakness      Subjective Assessment - 04/30/15 1810    Subjective Pt. states she missed last PT appt. due to increase pain/ muscle sorenss.  Pt. reports persistent pain in multiple areas/ regions.    Limitations Lifting;Walking;House hold activities   Diagnostic tests XRAY   Patient Stated Goals pain free Stamping/return to knitting/return to drive long distances    Currently in Pain? Yes   Pain Score 3    Pain Location Shoulder   Pain Orientation Right   Pain Type Chronic pain   Pain Onset More than a month ago        OBJECTIVE: Manual: Seated AROM of cervical spine and B shoulders  with ROM reassessment (full range).  STM to B upper trap./ low cervical region and posterior deltoid.  In supine, B shoulder mobilizations grade II 4 x 30 seconds each for pain control.  L AC joint grade II mobilizations to decrease sensitivity to touch. There.ex.: Seated YTB scap. Retraction/ diagonals/ tricep ext. 20x.  Supine 2# wand shoulder flexion/ press-ups/ ER ("popping" noted in R shoulder) 20x. Nustep L5 (arm position at 5) and (seat at 10) for 6 min. (no breaks/ no charge).  PT educated pt. On importance of shoulder/ posture strengthening to manage chronic pain. Pt response to tx for medical necessity: Pt benefits from manual therapy and strengthening to promote shoulder stability and return to prior level of function.  Good shoulder stability with minimal popping but not pain during there.ex.          PT Long Term Goals - 04/30/15 1833    PT LONG TERM GOAL #1   Title Pt will decrease Quick DASH score to < 30% in order to increase R UE functional mobility.   Baseline 55% on 11/09   Time 4   Period Weeks   Status On-going   PT LONG TERM GOAL #2   Title Pt will increase R shoulder AROM in pain free range to report  [place] with pain no greater than 2/10.   Baseline pt currently only able to drive around town without pain   Time 4   Period Weeks   Status Not Met   PT LONG TERM GOAL #3   Title Pt will be independent with HEP in order to increase R shoulder strength by 1/2 MMT to return to knitting.   Baseline pt is currently not knitting due to R shoulder pain.   Time 4   Period Weeks   Status Not Met   PT LONG TERM GOAL #4   Title Pt will demonstrate a decrease in tenderness to palpation of R scapular stabilization musculature in order to report a return to pain free stamping.   Baseline pt can only perform 2 stamping activities before having to stop   Time 4   Period Weeks   Status Not Met            Plan - 04/30/15 1826    Clinical  Impression Statement Pt. has generalized muscle tenderness with light palpation t/o B UT/ posterior shoulder/ rhomboid region.  Good shoulder AROM all planes and pt. worked hard with increase resistance ex.  Minimal cuing for proper technique/ prevent UT compensation.  Pt. will benefit from progression of strengthening/ conditioning ex. program. to obtain PT goals.    Pt will benefit from skilled therapeutic intervention in order to improve on the following deficits Decreased range of motion;Pain;Obesity;Decreased endurance;Impaired flexibility;Improper body mechanics;Postural dysfunction;Decreased strength;Decreased mobility;Hypomobility   Rehab Potential Fair   PT Frequency 2x / week   PT Duration 4 weeks   PT Treatment/Interventions ADLs/Self Care Home Management;Cryotherapy;Moist Heat;Electrical Stimulation;Therapeutic exercise;Balance training;Therapeutic activities;Functional mobility training;Stair training;Gait training;Patient/family education;Neuromuscular re-education;Dry needling;Energy conservation;Passive range of motion;Manual techniques   PT Next Visit Plan Continue posture/ strengthening ex. program.    PT Home Exercise Plan see handout   Consulted and Agree with Plan of Care Patient        Problem List Patient Active Problem List   Diagnosis Date Noted  . Right shoulder pain 12/05/2014  . Complex regional pain syndrome I of lower limb 07/05/2014  . Cervical postlaminectomy syndrome 09/07/2011  . Cervical radiculopathy at C6 09/07/2011   Pura Spice, PT, DPT # [phone]   04/30/2015, 6:35 PM  [facility] [address] [place], [place], [address] Phone: [phone]   Fax:  [phone]  Name: Sabrina Williams MRN: [id number] Date of Birth: 05-29-1970

## 2015-04-30 ENCOUNTER — Encounter: Payer: Self-pay | Admitting: Physical Therapy

## 2015-05-01 ENCOUNTER — Ambulatory Visit: Payer: BLUE CROSS/BLUE SHIELD | Admitting: Physical Therapy

## 2015-05-01 DIAGNOSIS — M25511 Pain in right shoulder: Secondary | ICD-10-CM | POA: Diagnosis not present

## 2015-05-01 DIAGNOSIS — M6281 Muscle weakness (generalized): Secondary | ICD-10-CM

## 2015-05-01 DIAGNOSIS — M25619 Stiffness of unspecified shoulder, not elsewhere classified: Secondary | ICD-10-CM

## 2015-05-02 NOTE — Therapy (Deleted)
[facility] [address] [place], [place], [address] Phone: [phone]   Fax:  [phone]  Physical Therapy Treatment  Patient Details  Name: Sabrina Williams MRN: [id number] Date of Birth: 09/04/1970 Referring Provider: Carney Bern, MD  Encounter Date: 05/01/2015      PT End of Session - 05/02/15 1749    Visit Number 6   Number of Visits 8   Date for PT Re-Evaluation 05/27/15      Past Medical History  Diagnosis Date  . Neuromuscular disorder ([place])   . Diabetes mellitus   . Depression   . Hypertension   . Chronic kidney disease     stones    Past Surgical History  Procedure Laterality Date  . Small intestine surgery      cervical laminectomy  . Utereroscopy  1995  . Lithotripsy      multiple  . Shoulder surgery  6/06  . Cesarean section      x2  . Endoscopic plantar fasciotomy Right August 2015  . Dental implant  July 2015    There were no vitals filed for this visit.  Visit Diagnosis:  Pain in joint of right shoulder  Decreased range of motion (ROM) of shoulder  Muscle weakness                                    PT Long Term Goals - 04/30/15 1833    PT LONG TERM GOAL #1   Title Pt will decrease Quick DASH score to < 30% in order to increase R UE functional mobility.   Baseline 55% on 11/09   Time 4   Period Weeks   Status On-going   PT LONG TERM GOAL #2   Title Pt will increase R shoulder AROM in pain free range to report driving on interstate with pain no greater than 2/10.   Baseline pt currently only able to drive around town without pain   Time 4   Period Weeks   Status Not Met   PT LONG TERM GOAL #3   Title Pt will be independent with HEP in order to increase R shoulder strength by 1/2 MMT to return to knitting.   Baseline pt is currently not knitting due to R shoulder pain.   Time 4   Period Weeks   Status Not Met   PT LONG TERM GOAL #4   Title Pt  will demonstrate a decrease in tenderness to palpation of R scapular stabilization musculature in order to report a return to pain free stamping.   Baseline pt can only perform 2 stamping activities before having to stop   Time 4   Period Weeks   Status Not Met               Problem List Patient Active Problem List   Diagnosis Date Noted  . Right shoulder pain 12/05/2014  . Complex regional pain syndrome I of lower limb 07/05/2014  . Cervical postlaminectomy syndrome 09/07/2011  . Cervical radiculopathy at C6 09/07/2011   Pura Spice, PT, DPT # [phone]   05/02/2015, 5:50 PM  [place] [facility] [address] [place], [place], [address] Phone: [phone]   Fax:  [phone]  Name: Sabrina Williams MRN: [id number] Date of Birth: December 14, 1970

## 2015-05-06 ENCOUNTER — Encounter: Payer: BLUE CROSS/BLUE SHIELD | Admitting: Physical Therapy

## 2015-05-06 NOTE — Therapy (Addendum)
[facility] [address] [place], [place], [address] Phone: [phone]   Fax:  [phone]  Physical Therapy Treatment  Patient Details  Name: Sabrina Williams MRN: [id number] Date of Birth: 1970/12/14 Referring Provider: Carney Bern, MD  Encounter Date: 05/01/2015    Past Medical History  Diagnosis Date  . Neuromuscular disorder ([place])   . Diabetes mellitus   . Depression   . Hypertension   . Chronic kidney disease     stones    Past Surgical History  Procedure Laterality Date  . Small intestine surgery      cervical laminectomy  . Utereroscopy  1995  . Lithotripsy      multiple  . Shoulder surgery  6/06  . Cesarean section      x2  . Endoscopic plantar fasciotomy Right August 2015  . Dental implant  July 2015    There were no vitals filed for this visit.  Visit Diagnosis:  Pain in joint of right shoulder  Decreased range of motion (ROM) of shoulder  Muscle weakness    OBJECTIVE: Manual: Seated AROM of cervical spine and B shoulders with ROM reassessment (full range)- no episodes of "catching" or pain with ROM. STM to B upper trap./ low cervical region and posterior deltoid. In supine, B shoulder mobilizations grade II 4 x 30 seconds each for pain control. There.ex.: Nustep L3 10 min. for 10 min. (no breaks/ no charge). PT educated pt. On importance of shoulder/ posture strengthening to manage chronic pain.  Nautilus: 30# lat. Pull downs/ 20# scap. Retraction 10x/ 10# scap. Retraction 10x/ 20# tricep ext. 20x.  Seated 2# press-ups 15x (moderate fatigue)/ 0# press-ups 15x.  2# bicep curls and punches.  Discussed HEP and DOMS.  Pt response to tx for medical necessity: Pt benefits from manual therapy and strengthening to promote shoulder stability and return to prior level of function. Good shoulder stability with minimal popping but not pain during there.ex.       Pt. requires frequent cuing/  education on importance of strengthening ex./ pain tolerable range of motion to improve mobility.  Pt. remains very pain focused/ muscle guarded with R shoulder active motion.  Pt. able to demonstrate full R shoulder AROM all planes but limited with light resistance strengthening ex./ progression with HEP.           PT Long Term Goals - 04/30/15 1833    PT LONG TERM GOAL #1   Title Pt will decrease Quick DASH score to < 30% in order to increase R UE functional mobility.   Baseline 55% on 11/09   Time 4   Period Weeks   Status On-going   PT LONG TERM GOAL #2   Title Pt will increase R shoulder AROM in pain free range to report [place] interstate with pain no greater than 2/10.   Baseline pt currently only able to drive around town without pain   Time 4   Period Weeks   Status Not Met   PT LONG TERM GOAL #3   Title Pt will be independent with HEP in order to increase R shoulder strength by 1/2 MMT to return to knitting.   Baseline pt is currently not knitting due to R shoulder pain.   Time 4   Period Weeks   Status Not Met   PT LONG TERM GOAL #4   Title Pt will demonstrate a decrease in tenderness to palpation of R scapular stabilization musculature in order  to report a return to pain free stamping.   Baseline pt can only perform 2 stamping activities before having to stop   Time 4   Period Weeks   Status Not Met       Problem List Patient Active Problem List   Diagnosis Date Noted  . Right shoulder pain 12/05/2014  . Complex regional pain syndrome I of lower limb 07/05/2014  . Cervical postlaminectomy syndrome 09/07/2011  . Cervical radiculopathy at C6 09/07/2011   Pura Spice, PT, DPT # [phone]   05/06/2015, 11:44 AM  [facility] [address] [place], [place], [address] Phone: [phone]   Fax:  [phone]  Name: Sabrina Williams MRN: [id number] Date of Birth: 1970/11/13

## 2015-05-08 ENCOUNTER — Encounter: Payer: Self-pay | Admitting: Physical Therapy

## 2015-05-08 ENCOUNTER — Ambulatory Visit: Payer: BLUE CROSS/BLUE SHIELD | Admitting: Physical Therapy

## 2015-05-08 DIAGNOSIS — M25511 Pain in right shoulder: Secondary | ICD-10-CM | POA: Diagnosis not present

## 2015-05-08 DIAGNOSIS — M6281 Muscle weakness (generalized): Secondary | ICD-10-CM

## 2015-05-08 DIAGNOSIS — M25619 Stiffness of unspecified shoulder, not elsewhere classified: Secondary | ICD-10-CM

## 2015-05-08 NOTE — Therapy (Signed)
[place] [facility] [address] [place], [place], [address] Phone: [phone]   Fax:  [phone]  Physical Therapy Treatment  Patient Details  Name: Sabrina Williams MRN: [id number] Date of Birth: June 08, 1970 Referring Provider: Carney Bern, MD  Encounter Date: 05/08/2015      PT End of Session - 05/08/15 1500    Visit Number 7   Number of Visits 9   Date for PT Re-Evaluation 05/27/15   PT Start Time 1350   PT Stop Time 1440   PT Time Calculation (min) 50 min   Activity Tolerance Patient tolerated treatment well;Patient limited by pain   Behavior During Therapy [facility] for tasks assessed/performed      Past Medical History  Diagnosis Date  . Neuromuscular disorder ([place])   . Diabetes mellitus   . Depression   . Hypertension   . Chronic kidney disease     stones    Past Surgical History  Procedure Laterality Date  . Small intestine surgery      cervical laminectomy  . Utereroscopy  1995  . Lithotripsy      multiple  . Shoulder surgery  6/06  . Cesarean section      x2  . Endoscopic plantar fasciotomy Right August 2015  . Dental implant  July 2015    There were no vitals filed for this visit.  Visit Diagnosis:  Pain in joint of right shoulder  Decreased range of motion (ROM) of shoulder  Muscle weakness      Subjective Assessment - 05/08/15 1500    Subjective Pt reports missing last appt due to generalized pain. Pt states the plan is to join a gym in Jan. Pt states her R shoulder pain is at a 3/10.   Limitations Lifting;Walking;House hold activities   Diagnostic tests XRAY   Patient Stated Goals pain free Stamping/return to knitting/return to drive long distances    Currently in Pain? Yes   Pain Score 3    Pain Location Shoulder   Pain Orientation Right        OBJECTIVE: in sitting, There ex: shoulder press/bicep curls/tricep extension/punches/scapular retraction/reverse fly/chest press/horiztonal  adduction/shoulder flexion wit 3# dumbbells 15 x2. Pt demonstrates good form and is able to self-correct when UT activation takes over. Manual: STM to R upper trap. Tightness and trigger points palpable. Pt finds relief from STM and heat in sitting to end teh session.   Pt response to tx for medical necessity: Pt benefits from strengthening and transitioning to a gym based program. PT to continue once a week for 2 weeks until patient is independent in gym routine.          PT Long Term Goals - 04/30/15 1833    PT LONG TERM GOAL #1   Title Pt will decrease Quick DASH score to < 30% in order to increase R UE functional mobility.   Baseline 55% on 11/09   Time 4   Period Weeks   Status On-going   PT LONG TERM GOAL #2   Title Pt will increase R shoulder AROM in pain free range to [place] interstate with pain no greater than 2/10.   Baseline pt currently only able to drive around town without pain   Time 4   Period Weeks   Status Not Met   PT LONG TERM GOAL #3   Title Pt will be independent with HEP in order to increase R shoulder strength by 1/2 MMT to return to  knitting.   Baseline pt is currently not knitting due to R shoulder pain.   Time 4   Period Weeks   Status Not Met   PT LONG TERM GOAL #4   Title Pt will demonstrate a decrease in tenderness to palpation of R scapular stabilization musculature in order to report a return to pain free stamping.   Baseline pt can only perform 2 stamping activities before having to stop   Time 4   Period Weeks   Status Not Met               Plan - 05/08/15 1501    Clinical Impression Statement Pt demonstrates efficiency and proper form with shoulder strengthening exercises. Pt requires cuing for endurance and increased weights. Pt demonstrates overall acceptance of activity tolerance but progressing to 3# dumbbells. Pt is palpably tender and tight in R upper trap. Pt is able to self-correct from R UT compensation with exercise.     Pt will benefit from skilled therapeutic intervention in order to improve on the following deficits Decreased range of motion;Pain;Obesity;Decreased endurance;Impaired flexibility;Improper body mechanics;Postural dysfunction;Decreased strength;Decreased mobility;Hypomobility   Rehab Potential Fair   PT Frequency 1x / week   PT Duration 2 weeks   PT Treatment/Interventions ADLs/Self Care Home Management;Cryotherapy;Moist Heat;Electrical Stimulation;Therapeutic exercise;Balance training;Therapeutic activities;Functional mobility training;Stair training;Gait training;Patient/family education;Neuromuscular re-education;Dry needling;Energy conservation;Passive range of motion;Manual techniques   PT Next Visit Plan Continue posture/ strengthening ex. program.  CHECK SCHEDULE.     PT Home Exercise Plan see handout   Consulted and Agree with Plan of Care Patient        Problem List Patient Active Problem List   Diagnosis Date Noted  . Right shoulder pain 12/05/2014  . Complex regional pain syndrome I of lower limb 07/05/2014  . Cervical postlaminectomy syndrome 09/07/2011  . Cervical radiculopathy at C6 09/07/2011    Lavone Neri, SPT 05/08/2015, 3:02 PM  [place] [facility] [address] [place], [place], [address] Phone: [phone]   Fax:  [phone]  Name: Sabrina Williams MRN: [id number] Date of Birth: 06/29/1970

## 2015-05-14 ENCOUNTER — Ambulatory Visit: Payer: BLUE CROSS/BLUE SHIELD | Admitting: Physical Therapy

## 2015-05-14 ENCOUNTER — Encounter: Payer: Self-pay | Admitting: Physical Therapy

## 2015-05-14 ENCOUNTER — Encounter: Payer: BLUE CROSS/BLUE SHIELD | Admitting: Physical Therapy

## 2015-05-14 DIAGNOSIS — M25511 Pain in right shoulder: Secondary | ICD-10-CM

## 2015-05-14 DIAGNOSIS — M25619 Stiffness of unspecified shoulder, not elsewhere classified: Secondary | ICD-10-CM

## 2015-05-14 DIAGNOSIS — M6281 Muscle weakness (generalized): Secondary | ICD-10-CM

## 2015-05-14 NOTE — Therapy (Signed)
[facility] [address] [place], [place], [address] Phone: [phone]   Fax:  [phone]  Physical Therapy Treatment  Patient Details  Name: Sabrina Williams MRN: [id number] Date of Birth: Nov 11, 1970 Referring Provider: Carney Bern, MD  Encounter Date: 05/14/2015      PT End of Session - 05/14/15 1433    Visit Number 8   Number of Visits 9   Date for PT Re-Evaluation 05/27/15   PT Start Time 9244   PT Stop Time 1050   PT Time Calculation (min) 48 min   Activity Tolerance Patient tolerated treatment well;Patient limited by pain   Behavior During Therapy [facility] for tasks assessed/performed      Past Medical History  Diagnosis Date  . Neuromuscular disorder ([place])   . Diabetes mellitus   . Depression   . Hypertension   . Chronic kidney disease     stones    Past Surgical History  Procedure Laterality Date  . Small intestine surgery      cervical laminectomy  . Utereroscopy  1995  . Lithotripsy      multiple  . Shoulder surgery  6/06  . Cesarean section      x2  . Endoscopic plantar fasciotomy Right August 2015  . Dental implant  July 2015    There were no vitals filed for this visit.  Visit Diagnosis:  Pain in joint of right shoulder  Decreased range of motion (ROM) of shoulder  Muscle weakness      Subjective Assessment - 05/14/15 1432    Subjective Pt reports generalized pain and stiffness upon arrival to PT tx session.    Limitations Lifting;Walking;House hold activities   Diagnostic tests XRAY   Patient Stated Goals pain free Stamping/return to knitting/return to drive long distances    Currently in Pain? Yes   Pain Score 7    Pain Location Shoulder   Pain Orientation Right        OBJECTIVE: Heat in sitting to R shoulder with 5 layers to warm up. Manual: In supine, STM to R upper trap/anterior deltoid and bicep. Palpable trigger points and tenderness noted. [place] AP grade II 30 seconds x 8 to  decrease pain. In sitting, STM to R upper trap/supraspinatus/rhomboids with increased tenderness. R upper trap responded well to ischemic compression in sitting and trigger points released.  Pt response to tx for medical necessity: Pt benefits from increased pain free ROM. Pt will benefit from R shoulder motion to increase functional mobility.          PT Long Term Goals - 04/30/15 1833    PT LONG TERM GOAL #1   Title Pt will decrease Quick DASH score to < 30% in order to increase R UE functional mobility.   Baseline 55% on 11/09   Time 4   Period Weeks   Status On-going   PT LONG TERM GOAL #2   Title Pt will increase R shoulder AROM in pain free range to report driving on interstate with pain no greater than 2/10.   Baseline pt currently only able to drive around town without pain   Time 4   Period Weeks   Status Not Met   PT LONG TERM GOAL #3   Title Pt will be independent with HEP in order to increase R shoulder strength by 1/2 MMT to return to knitting.   Baseline pt is currently not knitting due to R shoulder pain.   Time 4  Period Weeks   Status Not Met   PT LONG TERM GOAL #4   Title Pt will demonstrate a decrease in tenderness to palpation of R scapular stabilization musculature in order to report a return to pain free stamping.   Baseline pt can only perform 2 stamping activities before having to stop   Time 4   Period Weeks   Status Not Met           Plan - 05/14/15 1433    Clinical Impression Statement Pt demonstrates decreased activity tolerance as compared to previous session. Pt states that her R shoulder pain is better with heat but is having difficulty with R shoulder movement. Pt is unable to tolerate PROM. Pt is able to tolerate STM to R upper trap/anterior deltoid/bicep/supraspinatus with increased tenderness noted. [place] PAs grade II to R shoulder help decrease shoulder pain.    Pt will benefit from skilled therapeutic intervention in order to improve on the  following deficits Decreased range of motion;Pain;Obesity;Decreased endurance;Impaired flexibility;Improper body mechanics;Postural dysfunction;Decreased strength;Decreased mobility;Hypomobility   Rehab Potential Fair   PT Frequency 1x / week   PT Duration 2 weeks   PT Treatment/Interventions ADLs/Self Care Home Management;Cryotherapy;Moist Heat;Electrical Stimulation;Therapeutic exercise;Balance training;Therapeutic activities;Functional mobility training;Stair training;Gait training;Patient/family education;Neuromuscular re-education;Dry needling;Energy conservation;Passive range of motion;Manual techniques   PT Next Visit Plan Continue posture/ strengthening ex. program.  CHECK SCHEDULE.     PT Home Exercise Plan see handout   Consulted and Agree with Plan of Care Patient        Problem List Patient Active Problem List   Diagnosis Date Noted  . Right shoulder pain 12/05/2014  . Complex regional pain syndrome I of lower limb 07/05/2014  . Cervical postlaminectomy syndrome 09/07/2011  . Cervical radiculopathy at C6 09/07/2011    Lavone Neri, SPT 05/14/2015, 2:37 PM  [facility] [address] [place], [place], [address] Phone: [phone]   Fax:  [phone]  Name: YARITZEL STANGE MRN: [id number] Date of Birth: 1970/06/21

## 2015-05-20 ENCOUNTER — Encounter: Payer: BLUE CROSS/BLUE SHIELD | Attending: Physical Medicine & Rehabilitation | Admitting: Registered Nurse

## 2015-05-20 ENCOUNTER — Encounter: Payer: Self-pay | Admitting: Registered Nurse

## 2015-05-20 VITALS — BP 150/87 | HR 87

## 2015-05-20 DIAGNOSIS — M797 Fibromyalgia: Secondary | ICD-10-CM | POA: Insufficient documentation

## 2015-05-20 DIAGNOSIS — M961 Postlaminectomy syndrome, not elsewhere classified: Secondary | ICD-10-CM | POA: Diagnosis present

## 2015-05-20 DIAGNOSIS — Z5181 Encounter for therapeutic drug level monitoring: Secondary | ICD-10-CM

## 2015-05-20 DIAGNOSIS — M5412 Radiculopathy, cervical region: Secondary | ICD-10-CM | POA: Diagnosis not present

## 2015-05-20 DIAGNOSIS — M25511 Pain in right shoulder: Secondary | ICD-10-CM | POA: Diagnosis not present

## 2015-05-20 DIAGNOSIS — G90521 Complex regional pain syndrome I of right lower limb: Secondary | ICD-10-CM | POA: Diagnosis not present

## 2015-05-20 DIAGNOSIS — F32A Depression, unspecified: Secondary | ICD-10-CM

## 2015-05-20 DIAGNOSIS — M7918 Myalgia, other site: Secondary | ICD-10-CM

## 2015-05-20 DIAGNOSIS — F329 Major depressive disorder, single episode, unspecified: Secondary | ICD-10-CM

## 2015-05-20 DIAGNOSIS — Z79899 Other long term (current) drug therapy: Secondary | ICD-10-CM

## 2015-05-20 MED ORDER — TIZANIDINE HCL 4 MG PO TABS: ORAL_TABLET | ORAL | Status: DC

## 2015-05-20 MED ORDER — PREDNISONE 10 MG (21) PO TBPK: 10.0000 mg | ORAL_TABLET | Freq: Every day | ORAL | Status: DC

## 2015-05-20 MED ORDER — DICLOFENAC SODIUM 1 % TD GEL: TRANSDERMAL | Status: DC

## 2015-05-20 MED ORDER — TAPENTADOL HCL 75 MG PO TABS: 75.0000 mg | ORAL_TABLET | Freq: Every day | ORAL | Status: DC

## 2015-05-20 NOTE — Progress Notes (Signed)
Subjective:    Patient ID: Sabrina Williams, female    DOB: [birth year], 44 y.o.   MRN: [id number]  HPI: [name] is a 44 year old female who returns for follow up for chronic pain and medication refill. She says her pain is located in her right shoulder, left arm achy with weather changes, right knee and right foot. Her pain has intensified with weather changes. Will prescribe a steroid dose pak, she verbalizes understanding. Receiving physical therapy on her right arm twice a week. She rates her pain 5. Her current exercise regime is performing chair exercises and walking short distances.   Pain Inventory Average Pain 5 Pain Right Now 5 My pain is constant, sharp, burning, stabbing, tingling and aching  In the last 24 hours, has pain interfered with the following? General activity 7 Relation with others 4 Enjoyment of life 4 What TIME of day is your pain at its worst? evening Sleep (in general) Good  Pain is worse with: walking, standing and some activites Pain improves with: rest, heat/ice and medication Relief from Meds: 6  Mobility walk without assistance how many minutes can you walk? 15 ability to climb steps?  yes do you drive?  yes  Function not employed: date last employed . I need assistance with the following:  meal prep, household duties and shopping  Neuro/Psych No problems in this area  Prior Studies Any changes since last visit?  no  Physicians involved in your care Any changes since last visit?  no   Family History  Problem Relation Age of Onset  . Cancer Mother     multiple myeloma  . Diabetes Father   . Kidney disease Father   . Heart disease Father    Social History   Social History  . Marital Status: Married    Spouse Name: N/A  . Number of Children: N/A  . Years of Education: N/A   Social History Main Topics  . Smoking status: Never Smoker   . Smokeless tobacco: Never Used  . Alcohol Use: No  . Drug Use: No  . Sexual  Activity: Not Asked   Other Topics Concern  . None   Social History Narrative   Past Surgical History  Procedure Laterality Date  . Small intestine surgery      cervical laminectomy  . Utereroscopy  1995  . Lithotripsy      multiple  . Shoulder surgery  6/06  . Cesarean section      x2  . Endoscopic plantar fasciotomy Right August 2015  . Dental implant  July 2015   Past Medical History  Diagnosis Date  . Neuromuscular disorder ([place])   . Diabetes mellitus   . Depression   . Hypertension   . Chronic kidney disease     stones   BP 150/87 mmHg  Pulse 87  SpO2 95%  LMP  (LMP Unknown)  Opioid Risk Score:   Fall Risk Score:  `1  Depression screen PHQ 2/9  Depression screen [facility] 2/9 01/01/2015 08/12/2014  Decreased Interest 1 1  Down, Depressed, Hopeless 1 1  PHQ - 2 Score 2 2  Altered sleeping - 1  Tired, decreased energy - 3  Change in appetite - 2  Feeling bad or failure about yourself  - 3  Trouble concentrating - 2  Moving slowly or fidgety/restless - 1  Suicidal thoughts - 0  PHQ-9 Score - 14     Review of Systems  All other systems  reviewed and are negative.      Objective:   Physical Exam  Constitutional: She is oriented to person, place, and time. She appears well-developed and well-nourished.  HENT:  Head: Normocephalic and atraumatic.  Neck: Normal range of motion. Neck supple.  Cardiovascular: Normal rate and regular rhythm.   Pulmonary/Chest: Effort normal and breath sounds normal.  Musculoskeletal:  Normal Muscle Bulk and Muscle Testing Reveals:  Upper Extremities: Full ROM and Muscle Strength 5/5 Right AC Joint Tenderness Thoracic Paraspinal Tenderness: T-4- T-7 Lower Extremities: Full ROM and Muscle Strength 5/5 Arises from chair slowly Antalgic gait  Neurological: She is alert and oriented to person, place, and time.  Skin: Skin is warm and dry.  Psychiatric: She has a normal mood and affect.  Nursing note and vitals  reviewed.         Assessment & Plan:  1. Complex regional pain syndrome right lower extremity postoperative. She's s/p post plantar fascial release. She has sensitivity touch. She has diffuse numbness and tingling. Refilled: Nucynta 75 mg one tablet five times a day. #150. Increased hypersensitivity.RX: Prednisone 2.Myofascial pain syndrome: Continue with voltaren gel, heat and exercise regime.  3. Depression: Continue: Zoloft  20 minutes of face to face patient care time was spent during this visit. All questions was encouraged and answered.  F/U in 1 month

## 2015-05-21 ENCOUNTER — Encounter: Payer: BLUE CROSS/BLUE SHIELD | Admitting: Physical Therapy

## 2015-05-22 ENCOUNTER — Encounter: Payer: BLUE CROSS/BLUE SHIELD | Admitting: Physical Therapy

## 2015-06-24 ENCOUNTER — Encounter: Payer: BLUE CROSS/BLUE SHIELD | Attending: Physical Medicine & Rehabilitation | Admitting: Registered Nurse

## 2015-06-24 ENCOUNTER — Encounter: Payer: Self-pay | Admitting: Registered Nurse

## 2015-06-24 VITALS — BP 140/78 | HR 94 | Resp 16

## 2015-06-24 DIAGNOSIS — M25511 Pain in right shoulder: Secondary | ICD-10-CM | POA: Diagnosis not present

## 2015-06-24 DIAGNOSIS — M797 Fibromyalgia: Secondary | ICD-10-CM | POA: Diagnosis not present

## 2015-06-24 DIAGNOSIS — M961 Postlaminectomy syndrome, not elsewhere classified: Secondary | ICD-10-CM | POA: Diagnosis present

## 2015-06-24 DIAGNOSIS — Z5181 Encounter for therapeutic drug level monitoring: Secondary | ICD-10-CM

## 2015-06-24 DIAGNOSIS — M5412 Radiculopathy, cervical region: Secondary | ICD-10-CM | POA: Insufficient documentation

## 2015-06-24 DIAGNOSIS — G90521 Complex regional pain syndrome I of right lower limb: Secondary | ICD-10-CM | POA: Insufficient documentation

## 2015-06-24 DIAGNOSIS — M7918 Myalgia, other site: Secondary | ICD-10-CM

## 2015-06-24 DIAGNOSIS — M25512 Pain in left shoulder: Secondary | ICD-10-CM

## 2015-06-24 DIAGNOSIS — M62830 Muscle spasm of back: Secondary | ICD-10-CM

## 2015-06-24 DIAGNOSIS — Z79899 Other long term (current) drug therapy: Secondary | ICD-10-CM

## 2015-06-24 MED ORDER — TAPENTADOL HCL 75 MG PO TABS: 75.0000 mg | ORAL_TABLET | Freq: Every day | ORAL | Status: DC

## 2015-06-24 NOTE — Progress Notes (Signed)
Subjective:    Patient ID: Sabrina Williams, female    DOB: 06/28/1970, 45 y.o.   MRN: [id number]  HPI: [name] is a 45 year old female who returns for follow up for chronic pain and medication refill. She says her pain is located in her bilateral shoulders, left arm,  Lower back mainly right side radiating into right lower extremity posteriorly and right foot.  Also states her pain has intensified over the last month and not receiving  relief with Nucynta. We discussed Nucynta extended release. She will call her insurance company regarding her co-pay amount.The other option will be to Increase Nucynta to 100 mg QID. Instructed to call office. Discuss the plan with Dr. Letta Pate and he agrees with plan. She is aware if we increase dosage she will return the Nucynta 75 mg prescription and new prescription will be given. She verbalizes understanding. She rates her pain 5. Her current exercise regime is performing chair exercises, using light weights every other day and walking short distances.   Pain Inventory Average Pain 5 Pain Right Now 5 My pain is constant, sharp, burning, tingling and aching  In the last 24 hours, has pain interfered with the following? General activity 6 Relation with others 3 Enjoyment of life 4 What TIME of day is your pain at its worst? evening Sleep (in general) Good  Pain is worse with: walking, bending, standing and some activites Pain improves with: rest and medication Relief from Meds: 6  Mobility walk without assistance how many minutes can you walk? 15 Do you have any goals in this area?  yes  Function not employed: date last employed Jan 2006 I need assistance with the following:  meal prep, household duties and shopping Do you have any goals in this area?  yes  Neuro/Psych numbness tingling spasms depression anxiety  Prior Studies Any changes since last visit?  no  Physicians involved in your care Any changes since last  visit?  no   Family History  Problem Relation Age of Onset  . Cancer Mother     multiple myeloma  . Diabetes Father   . Kidney disease Father   . Heart disease Father    Social History   Social History  . Marital Status: Married    Spouse Name: N/A  . Number of Children: N/A  . Years of Education: N/A   Social History Main Topics  . Smoking status: Never Smoker   . Smokeless tobacco: Never Used  . Alcohol Use: No  . Drug Use: No  . Sexual Activity: Not Asked   Other Topics Concern  . None   Social History Narrative   Past Surgical History  Procedure Laterality Date  . Small intestine surgery      cervical laminectomy  . Utereroscopy  1995  . Lithotripsy      multiple  . Shoulder surgery  6/06  . Cesarean section      x2  . Endoscopic plantar fasciotomy Right August 2015  . Dental implant  July 2015   Past Medical History  Diagnosis Date  . Neuromuscular disorder ([place])   . Diabetes mellitus   . Depression   . Hypertension   . Chronic kidney disease     stones   BP 140/78 mmHg  Pulse 94  Resp 16  SpO2 95%  LMP 06/23/2015  Opioid Risk Score:   Fall Risk Score:  `1  Depression screen PHQ 2/9  Depression screen [facility] 2/9 01/01/2015  08/12/2014  Decreased Interest 1 1  Down, Depressed, Hopeless 1 1  PHQ - 2 Score 2 2  Altered sleeping - 1  Tired, decreased energy - 3  Change in appetite - 2  Feeling bad or failure about yourself  - 3  Trouble concentrating - 2  Moving slowly or fidgety/restless - 1  Suicidal thoughts - 0  PHQ-9 Score - 14     Review of Systems  Neurological: Positive for numbness.       Tingling, Spasms  Psychiatric/Behavioral: Positive for dysphoric mood. The patient is nervous/anxious.   All other systems reviewed and are negative.      Objective:   Physical Exam  Constitutional: She is oriented to person, place, and time. She appears well-developed and well-nourished.  HENT:  Head: Normocephalic and atraumatic.    Neck: Normal range of motion. Neck supple.  Cardiovascular: Normal rate and regular rhythm.   Pulmonary/Chest: Effort normal and breath sounds normal.  Musculoskeletal:  Normal Muscle Bulk and Muscle Testing Reveals: Upper Extremities: Full ROM and Muscle Strength 5/5 Left Rhomboid Tenderness Lumbar Paraspinal Tenderness: L-3-L-5, Mainly Left Side Lower Extremities: Full ROM and Muscle Strength 5/5 Arises from chair with ease Narrow Based gait   Neurological: She is alert and oriented to person, place, and time.  Skin: Skin is warm and dry.  Psychiatric: She has a normal mood and affect.  Nursing note and vitals reviewed.         Assessment & Plan:  1. Complex regional pain syndrome right lower extremity postoperative. She's s/p post plantar fascial release. She has sensitivity touch. She has diffuse numbness and tingling. Refilled: Nucynta 75 mg one tablet five times a day. #150. 2.Myofascial pain syndrome: Continue with voltaren gel, heat and exercise regime.  3. Depression: Continue: Zoloft  20 minutes of face to face patient care time was spent during this visit. All questions was encouraged and answered.  F/U in 1 month

## 2015-06-24 NOTE — Progress Notes (Signed)
   Subjective:    Patient ID: Sabrina Williams, female    DOB: [birth year], 45 y.o.   MRN: [id number]  HPI   Review of Systems     Objective:   Physical Exam        Assessment & Plan:

## 2015-06-26 ENCOUNTER — Telehealth: Payer: Self-pay

## 2015-06-26 NOTE — Telephone Encounter (Signed)
Pt contacted her insurance company. They stated for a 30 day refill of Nucynta, it would cost $90. She would like to try the 100mg  QID instead. Please advise.

## 2015-06-27 NOTE — Telephone Encounter (Signed)
Spoke with Sabrina Williams,  We will prescribe the Nucynta 100 mg QID. She will call next week with the date her son will return the old prescription. She verbalizes understanding.

## 2015-07-01 ENCOUNTER — Telehealth: Payer: Self-pay | Admitting: *Deleted

## 2015-07-01 ENCOUNTER — Telehealth: Payer: Self-pay | Admitting: Registered Nurse

## 2015-07-01 MED ORDER — TAPENTADOL HCL 100 MG PO TABS: 1.0000 | ORAL_TABLET | Freq: Four times a day (QID) | ORAL | Status: DC | PRN

## 2015-07-01 NOTE — Telephone Encounter (Signed)
Spoke with Sabrina Williams , her husband Sabrina Williams will pick up her prescription on 07/02/2015. Will Prescribe Nucynta 100 mg QID, she verbalizes understanding.

## 2015-07-01 NOTE — Telephone Encounter (Signed)
[name] called with some questions she would like to discuss with Sabrina Williams regarding her Nucynta.  Please call.

## 2015-07-01 NOTE — Telephone Encounter (Signed)
Reminded Sabrina Williams to return the Nucynta 75 mg prescription, she verbalizes understanding.

## 2015-07-21 ENCOUNTER — Other Ambulatory Visit: Payer: Self-pay | Admitting: Physical Medicine & Rehabilitation

## 2015-07-22 ENCOUNTER — Encounter: Payer: BLUE CROSS/BLUE SHIELD | Admitting: Registered Nurse

## 2015-07-25 ENCOUNTER — Encounter: Payer: BLUE CROSS/BLUE SHIELD | Attending: Physical Medicine & Rehabilitation | Admitting: Registered Nurse

## 2015-07-25 ENCOUNTER — Encounter: Payer: Self-pay | Admitting: Registered Nurse

## 2015-07-25 VITALS — BP 121/69 | HR 100

## 2015-07-25 DIAGNOSIS — M25511 Pain in right shoulder: Secondary | ICD-10-CM

## 2015-07-25 DIAGNOSIS — G90521 Complex regional pain syndrome I of right lower limb: Secondary | ICD-10-CM | POA: Insufficient documentation

## 2015-07-25 DIAGNOSIS — Z79899 Other long term (current) drug therapy: Secondary | ICD-10-CM

## 2015-07-25 DIAGNOSIS — Z5181 Encounter for therapeutic drug level monitoring: Secondary | ICD-10-CM | POA: Diagnosis not present

## 2015-07-25 DIAGNOSIS — M62830 Muscle spasm of back: Secondary | ICD-10-CM | POA: Diagnosis not present

## 2015-07-25 DIAGNOSIS — M5412 Radiculopathy, cervical region: Secondary | ICD-10-CM | POA: Diagnosis not present

## 2015-07-25 DIAGNOSIS — M961 Postlaminectomy syndrome, not elsewhere classified: Secondary | ICD-10-CM | POA: Insufficient documentation

## 2015-07-25 DIAGNOSIS — M797 Fibromyalgia: Secondary | ICD-10-CM | POA: Diagnosis not present

## 2015-07-25 MED ORDER — TAPENTADOL HCL 100 MG PO TABS: 1.0000 | ORAL_TABLET | Freq: Four times a day (QID) | ORAL | Status: DC | PRN

## 2015-07-25 MED ORDER — TIZANIDINE HCL 4 MG PO TABS: 4.0000 mg | ORAL_TABLET | Freq: Every day | ORAL | Status: DC | PRN

## 2015-07-25 NOTE — Progress Notes (Signed)
Subjective:    Patient ID: Sabrina Williams, female    DOB: Jan 05, 1971, 45 y.o.   MRN: [id number]  HPI: [name] is a 45 year old female who returns for follow up for chronic pain and medication refill. She states her pain is located in her right shoulder and right foot. She rates her pain 6. Her current exercise regime is walking. States she has been diagnosed with CRF, her nephrologist is Dr. Holley Raring at [facility] [phone].  Also states her mom has been diagnosed with Multiple Myeloma and placed in hospice. Emotional support given.  Pain Inventory Average Pain 5 Pain Right Now 6 My pain is constant, sharp, burning, dull, stabbing, tingling and aching  In the last 24 hours, has pain interfered with the following? General activity 6 Relation with others 3 Enjoyment of life 5 What TIME of day is your pain at its worst? evening Sleep (in general) NA  Pain is worse with: walking, standing and some activites Pain improves with: rest, heat/ice, therapy/exercise and medication Relief from Meds: 7  Mobility walk without assistance how many minutes can you walk? 20-30 ability to climb steps?  yes do you drive?  yes  Function I need assistance with the following:  meal prep, household duties and shopping  Neuro/Psych numbness tingling spasms depression anxiety  Prior Studies Any changes since last visit?  no  Physicians involved in your care Any changes since last visit?  no   Family History  Problem Relation Age of Onset  . Cancer Mother     multiple myeloma  . Diabetes Father   . Kidney disease Father   . Heart disease Father    Social History   Social History  . Marital Status: Married    Spouse Name: N/A  . Number of Children: N/A  . Years of Education: N/A   Social History Main Topics  . Smoking status: Never Smoker   . Smokeless tobacco: Never Used  . Alcohol Use: No  . Drug Use: No  . Sexual Activity: Not  Asked   Other Topics Concern  . None   Social History Narrative   Past Surgical History  Procedure Laterality Date  . Small intestine surgery      cervical laminectomy  . Utereroscopy  1995  . Lithotripsy      multiple  . Shoulder surgery  6/06  . Cesarean section      x2  . Endoscopic plantar fasciotomy Right August 2015  . Dental implant  July 2015   Past Medical History  Diagnosis Date  . Neuromuscular disorder ([place])   . Diabetes mellitus   . Depression   . Hypertension   . Chronic kidney disease     stones   BP 121/69 mmHg  Pulse 107  SpO2 97%  Opioid Risk Score:   Fall Risk Score:  `1  Depression screen PHQ 2/9  Depression screen [facility] 2/9 07/25/2015 01/01/2015 08/12/2014  Decreased Interest '1 1 1  '$ Down, Depressed, Hopeless '1 1 1  '$ PHQ - 2 Score '2 2 2  '$ Altered sleeping - - 1  Tired, decreased energy - - 3  Change in appetite - - 2  Feeling bad or failure about yourself  - - 3  Trouble concentrating - - 2  Moving slowly or fidgety/restless - - 1  Suicidal thoughts - - 0  PHQ-9 Score - - 14     Review of Systems  Endocrine:  High blood sugars  All other systems reviewed and are negative.      Objective:   Physical Exam  Constitutional: She is oriented to person, place, and time. She appears well-developed and well-nourished.  HENT:  Head: Normocephalic and atraumatic.  Neck: Normal range of motion. Neck supple.  Cardiovascular: Normal rate and regular rhythm.   Pulmonary/Chest: Effort normal and breath sounds normal.  Musculoskeletal:  Normal Muscle Bulk and Muscle Testing Reveals: Upper Extremities: Full ROM and Muscle Strength 5/5 Right Rhomboid Tenderness Lower Extremities: Full ROM and Muscle Strength 5/5 Arises From Chair with Ease  Narrow Based Gait  Neurological: She is alert and oriented to person, place, and time.  Skin: Skin is warm and dry.  Psychiatric: She has a normal mood and affect.  Nursing note and vitals  reviewed.         Assessment & Plan:  1. Complex regional pain syndrome right lower extremity postoperative. She's s/p post plantar fascial release. She has sensitivity touch. She has diffuse numbness and tingling. Refilled: Nucynta 100 mg one tablet four times a day. #120. 2.Myofascial pain syndrome: Continue with ice, heat and exercise regime.  3. Depression: Continue: Zoloft  20 minutes of face to face patient care time was spent during this visit. All questions was encouraged and answered.  F/U in 1 month

## 2015-08-26 ENCOUNTER — Encounter: Payer: BLUE CROSS/BLUE SHIELD | Attending: Physical Medicine & Rehabilitation | Admitting: Registered Nurse

## 2015-08-26 ENCOUNTER — Encounter: Payer: Self-pay | Admitting: Registered Nurse

## 2015-08-26 VITALS — BP 115/60 | HR 93

## 2015-08-26 DIAGNOSIS — G90521 Complex regional pain syndrome I of right lower limb: Secondary | ICD-10-CM | POA: Diagnosis not present

## 2015-08-26 DIAGNOSIS — F32A Depression, unspecified: Secondary | ICD-10-CM

## 2015-08-26 DIAGNOSIS — M5412 Radiculopathy, cervical region: Secondary | ICD-10-CM | POA: Diagnosis not present

## 2015-08-26 DIAGNOSIS — M62830 Muscle spasm of back: Secondary | ICD-10-CM | POA: Diagnosis not present

## 2015-08-26 DIAGNOSIS — M797 Fibromyalgia: Secondary | ICD-10-CM | POA: Insufficient documentation

## 2015-08-26 DIAGNOSIS — M25512 Pain in left shoulder: Secondary | ICD-10-CM

## 2015-08-26 DIAGNOSIS — M961 Postlaminectomy syndrome, not elsewhere classified: Secondary | ICD-10-CM | POA: Insufficient documentation

## 2015-08-26 DIAGNOSIS — Z5181 Encounter for therapeutic drug level monitoring: Secondary | ICD-10-CM | POA: Diagnosis not present

## 2015-08-26 DIAGNOSIS — F329 Major depressive disorder, single episode, unspecified: Secondary | ICD-10-CM | POA: Diagnosis not present

## 2015-08-26 DIAGNOSIS — Z79899 Other long term (current) drug therapy: Secondary | ICD-10-CM

## 2015-08-26 DIAGNOSIS — M25511 Pain in right shoulder: Secondary | ICD-10-CM | POA: Diagnosis not present

## 2015-08-26 MED ORDER — TAPENTADOL HCL 100 MG PO TABS: 1.0000 | ORAL_TABLET | Freq: Four times a day (QID) | ORAL | Status: DC | PRN

## 2015-08-26 NOTE — Progress Notes (Signed)
Subjective:    Patient ID: Sabrina Williams, female    DOB: Aug 17, 1970, 45 y.o.   MRN: [id number]  HPI: [name] is a 45 year old female who returns for follow up for chronic pain and medication refill. She states her pain is located in her bilateral shoulders and right foot. She rates her pain 5. Her current exercise regime is walking.   Also states her mother passed away on 08-13-15 she was diagnosed with Multiple Myeloma under hospice care. Emotional support given.  Pain Inventory Average Pain 5 Pain Right Now 5 My pain is constant, sharp, burning, dull, stabbing, tingling and aching  In the last 24 hours, has pain interfered with the following? General activity 7 Relation with others 5 Enjoyment of life 6 What TIME of day is your pain at its worst? evening Sleep (in general) NA  Pain is worse with: walking, bending, standing and some activites Pain improves with: rest, heat/ice, therapy/exercise, medication and TENS Relief from Meds: 7  Mobility walk without assistance how many minutes can you walk? 20 ability to climb steps?  yes do you drive?  yes Do you have any goals in this area?  no  Function not employed: date last employed . I need assistance with the following:  meal prep, household duties and shopping Do you have any goals in this area?  no  Neuro/Psych numbness tingling spasms dizziness depression anxiety  Prior Studies Any changes since last visit?  no  Physicians involved in your care Any changes since last visit?  yes   Family History  Problem Relation Age of Onset  . Cancer Mother     multiple myeloma  . Diabetes Father   . Kidney disease Father   . Heart disease Father    Social History   Social History  . Marital Status: Married    Spouse Name: N/A  . Number of Children: N/A  . Years of Education: N/A   Social History Main Topics  . Smoking status: Never Smoker   . Smokeless tobacco: Never Used  . Alcohol Use:  No  . Drug Use: No  . Sexual Activity: Not Asked   Other Topics Concern  . None   Social History Narrative   Past Surgical History  Procedure Laterality Date  . Small intestine surgery      cervical laminectomy  . Utereroscopy  1995  . Lithotripsy      multiple  . Shoulder surgery  6/06  . Cesarean section      x2  . Endoscopic plantar fasciotomy Right August 2015  . Dental implant  July 2015   Past Medical History  Diagnosis Date  . Neuromuscular disorder ([place])   . Diabetes mellitus   . Depression   . Hypertension   . Chronic kidney disease     stones   BP 133/59 mmHg  Pulse 93  SpO2 94%  Opioid Risk Score:   Fall Risk Score:  `1  Depression screen PHQ 2/9  Depression screen [facility] 2/9 07/25/2015 01/01/2015 08/12/2014  Decreased Interest _0 Down, Depressed, Hopeless _1 PHQ - 2 Score _2 Altered sleeping - - 1  Tired, decreased energy - - 3  Change in appetite - - 2  Feeling bad or failure about yourself  - - 3  Trouble concentrating - - 2  Moving slowly or fidgety/restless - - 1  Suicidal thoughts - - 0  PHQ-9 Score - -  14     Review of Systems     Objective:   Physical Exam  Constitutional: She is oriented to person, place, and time. She appears well-developed and well-nourished.  HENT:  Head: Normocephalic and atraumatic.  Neck: Normal range of motion. Neck supple.  Cardiovascular: Normal rate and regular rhythm.   Pulmonary/Chest: Effort normal and breath sounds normal.  Musculoskeletal:  Normal Muscle Bulk and Muscle Testing Reveals: Upper Extremities: Full ROM and Muscle Strength 5/5 Lumbar Paraspinal Tenderness: L-3- L-5 Lower Extremities: Full ROM and Muscle Strength 5/5 Arises from chair with ease Narrow Based Gait   Neurological: She is alert and oriented to person, place, and time.  Skin: Skin is warm and dry.  Psychiatric: She has a normal mood and affect.  Nursing note and vitals reviewed.         Assessment & Plan:    1. Complex regional pain syndrome right lower extremity postoperative. She's s/p post plantar fascial release. She has sensitivity touch. She has diffuse numbness and tingling. Refilled: Nucynta 100 mg one tablet four times a day. #120. 2.Myofascial pain syndrome: Continue with ice, heat and exercise regime.  3. Depression: Continue: Zoloft  20 minutes of face to face patient care time was spent during this visit. All questions was encouraged and answered.  F/U in 1 month

## 2015-09-23 DIAGNOSIS — E7800 Pure hypercholesterolemia, unspecified: Secondary | ICD-10-CM | POA: Insufficient documentation

## 2015-09-24 ENCOUNTER — Encounter: Payer: Self-pay | Admitting: Registered Nurse

## 2015-09-24 ENCOUNTER — Encounter: Payer: BLUE CROSS/BLUE SHIELD | Attending: Physical Medicine & Rehabilitation | Admitting: Registered Nurse

## 2015-09-24 VITALS — BP 140/70 | HR 91 | Resp 14

## 2015-09-24 DIAGNOSIS — M797 Fibromyalgia: Secondary | ICD-10-CM | POA: Insufficient documentation

## 2015-09-24 DIAGNOSIS — M961 Postlaminectomy syndrome, not elsewhere classified: Secondary | ICD-10-CM | POA: Diagnosis not present

## 2015-09-24 DIAGNOSIS — M5412 Radiculopathy, cervical region: Secondary | ICD-10-CM | POA: Insufficient documentation

## 2015-09-24 DIAGNOSIS — F32A Depression, unspecified: Secondary | ICD-10-CM

## 2015-09-24 DIAGNOSIS — F329 Major depressive disorder, single episode, unspecified: Secondary | ICD-10-CM | POA: Diagnosis not present

## 2015-09-24 DIAGNOSIS — M62830 Muscle spasm of back: Secondary | ICD-10-CM | POA: Diagnosis not present

## 2015-09-24 DIAGNOSIS — Z5181 Encounter for therapeutic drug level monitoring: Secondary | ICD-10-CM

## 2015-09-24 DIAGNOSIS — G90521 Complex regional pain syndrome I of right lower limb: Secondary | ICD-10-CM | POA: Insufficient documentation

## 2015-09-24 DIAGNOSIS — M25512 Pain in left shoulder: Secondary | ICD-10-CM

## 2015-09-24 DIAGNOSIS — M25511 Pain in right shoulder: Secondary | ICD-10-CM | POA: Diagnosis not present

## 2015-09-24 DIAGNOSIS — Z79899 Other long term (current) drug therapy: Secondary | ICD-10-CM

## 2015-09-24 MED ORDER — TAPENTADOL HCL 100 MG PO TABS: 1.0000 | ORAL_TABLET | Freq: Four times a day (QID) | ORAL | Status: DC | PRN

## 2015-09-24 NOTE — Progress Notes (Signed)
Subjective:    Patient ID: Sabrina Williams, female    DOB: 1971-03-30, 45 y.o.   MRN: [id number]  HPI: [name] is a 45 year old female who returns for follow up for chronic pain and medication refill. She states her pain is located in her bilateral shoulders and right foot. She rates her pain 4. Her current exercise regime is walking.   Pain Inventory Average Pain 6 Pain Right Now 4 My pain is constant, sharp, burning, dull, stabbing, tingling and aching  In the last 24 hours, has pain interfered with the following? General activity 7 Relation with others 4 Enjoyment of life 5 What TIME of day is your pain at its worst? evening Sleep (in general) Good  Pain is worse with: walking, bending, standing and some activites Pain improves with: rest, heat/ice and medication Relief from Meds: 7  Mobility walk without assistance how many minutes can you walk? 10 ability to climb steps?  yes do you drive?  yes Do you have any goals in this area?  yes  Function not employed: date last employed . I need assistance with the following:  meal prep, household duties and shopping Do you have any goals in this area?  no  Neuro/Psych weakness numbness tingling depression anxiety  Prior Studies Any changes since last visit?  no  Physicians involved in your care Any changes since last visit?  no   Family History  Problem Relation Age of Onset  . Cancer Mother     multiple myeloma  . Diabetes Father   . Kidney disease Father   . Heart disease Father    Social History   Social History  . Marital Status: Married    Spouse Name: N/A  . Number of Children: N/A  . Years of Education: N/A   Social History Main Topics  . Smoking status: Never Smoker   . Smokeless tobacco: Never Used  . Alcohol Use: No  . Drug Use: No  . Sexual Activity: Not Asked   Other Topics Concern  . None   Social History Narrative   Past Surgical History  Procedure Laterality  Date  . Small intestine surgery      cervical laminectomy  . Utereroscopy  1995  . Lithotripsy      multiple  . Shoulder surgery  6/06  . Cesarean section      x2  . Endoscopic plantar fasciotomy Right August 2015  . Dental implant  July 2015   Past Medical History  Diagnosis Date  . Neuromuscular disorder ([place])   . Diabetes mellitus   . Depression   . Hypertension   . Chronic kidney disease     stones   BP 140/70 mmHg  Pulse 91  Resp 14  SpO2 96%  Opioid Risk Score:   Fall Risk Score:  `1  Depression screen PHQ 2/9  Depression screen [facility] 2/9 07/25/2015 01/01/2015 08/12/2014  Decreased Interest _0 Down, Depressed, Hopeless _1 PHQ - 2 Score _2 Altered sleeping - - 1  Tired, decreased energy - - 3  Change in appetite - - 2  Feeling bad or failure about yourself  - - 3  Trouble concentrating - - 2  Moving slowly or fidgety/restless - - 1  Suicidal thoughts - - 0  PHQ-9 Score - - 14     Review of Systems  All other systems reviewed and are negative.  Objective:   Physical Exam  Constitutional: She is oriented to person, place, and time. She appears well-developed and well-nourished.  HENT:  Head: Normocephalic and atraumatic.  Neck: Normal range of motion. Neck supple.  Cardiovascular: Normal rate and regular rhythm.   Pulmonary/Chest: Effort normal and breath sounds normal.  Musculoskeletal:  Normal Muscle Bulk and Muscle Testing Reveals: Upper Extremities: Full ROM and Muscle Strength 5/5 Thoracic Paraspinal Tenderness: T-7- T-9 Lower Extremities: Full ROM and Muscle Strength 5/5 Arises from chair with ease Narrow based gait  Neurological: She is alert and oriented to person, place, and time.  Skin: Skin is warm and dry.  Nursing note and vitals reviewed.         Assessment & Plan:  1. Complex regional pain syndrome right lower extremity postoperative. She's s/p post plantar fascial release. She has sensitivity touch. She has  diffuse numbness and tingling. Continue Gabapentin. Refilled: Nucynta 100 mg one tablet four times a day. #120. We will continue the opioid monitoring program, this consists of regular clinic visits, examinations, urine drug screen, pill counts as well as use of [place] Controlled Substance reporting System. 2.Myofascial pain syndrome: Continue with ice, heat and exercise regime.  3. Depression: Continue: Zoloft 4. Muscle Spasm: Continue Tizanidine  20 minutes of face to face patient care time was spent during this visit. All questions was encouraged and answered.  F/U in 1 month

## 2015-10-24 ENCOUNTER — Ambulatory Visit: Payer: BLUE CROSS/BLUE SHIELD | Admitting: Physical Medicine & Rehabilitation

## 2015-10-28 ENCOUNTER — Encounter: Payer: Self-pay | Admitting: Registered Nurse

## 2015-10-28 ENCOUNTER — Encounter: Payer: BLUE CROSS/BLUE SHIELD | Attending: Physical Medicine & Rehabilitation | Admitting: Registered Nurse

## 2015-10-28 VITALS — BP 121/61 | HR 80 | Resp 16

## 2015-10-28 DIAGNOSIS — F329 Major depressive disorder, single episode, unspecified: Secondary | ICD-10-CM

## 2015-10-28 DIAGNOSIS — M5412 Radiculopathy, cervical region: Secondary | ICD-10-CM | POA: Insufficient documentation

## 2015-10-28 DIAGNOSIS — M961 Postlaminectomy syndrome, not elsewhere classified: Secondary | ICD-10-CM | POA: Insufficient documentation

## 2015-10-28 DIAGNOSIS — G90521 Complex regional pain syndrome I of right lower limb: Secondary | ICD-10-CM | POA: Diagnosis not present

## 2015-10-28 DIAGNOSIS — M62830 Muscle spasm of back: Secondary | ICD-10-CM

## 2015-10-28 DIAGNOSIS — M7918 Myalgia, other site: Secondary | ICD-10-CM

## 2015-10-28 DIAGNOSIS — M797 Fibromyalgia: Secondary | ICD-10-CM | POA: Insufficient documentation

## 2015-10-28 DIAGNOSIS — M25512 Pain in left shoulder: Secondary | ICD-10-CM

## 2015-10-28 DIAGNOSIS — Z5181 Encounter for therapeutic drug level monitoring: Secondary | ICD-10-CM | POA: Diagnosis not present

## 2015-10-28 DIAGNOSIS — M25511 Pain in right shoulder: Secondary | ICD-10-CM

## 2015-10-28 DIAGNOSIS — Z79899 Other long term (current) drug therapy: Secondary | ICD-10-CM

## 2015-10-28 DIAGNOSIS — F32A Depression, unspecified: Secondary | ICD-10-CM

## 2015-10-28 MED ORDER — TAPENTADOL HCL 100 MG PO TABS: 1.0000 | ORAL_TABLET | Freq: Four times a day (QID) | ORAL | Status: DC | PRN

## 2015-10-28 NOTE — Progress Notes (Signed)
Subjective:    Patient ID: Sabrina Williams, female    DOB: 03-29-1971, 45 y.o.   MRN: [id number]  HPI: [name] is a 45 year old female who returns for follow up for chronic pain and medication refill. She states her pain is located in her left shoulder, right foot and left heel. She rates her pain 4. Her current exercise regime is walking short distances and performing stretching exercises.  Also states her father passed away last week, in the last 3 months she has lost both of her parents, emotional support given.  Pain Inventory Average Pain 5 Pain Right Now 4 My pain is constant, sharp, burning, dull, stabbing, tingling and aching  In the last 24 hours, has pain interfered with the following? General activity 7 Relation with others 4 Enjoyment of life 7 What TIME of day is your pain at its worst? all times Sleep (in general) Fair  Pain is worse with: walking, bending and some activites Pain improves with: rest, heat/ice, medication and TENS Relief from Meds: 7  Mobility walk with assistance how many minutes can you walk? 20 ability to climb steps?  yes do you drive?  yes Do you have any goals in this area?  yes  Function not employed: date last employed . I need assistance with the following:  meal prep, household duties and shopping Do you have any goals in this area?  no  Neuro/Psych bladder control problems weakness numbness trouble walking spasms depression anxiety  Prior Studies Any changes since last visit?  no  Physicians involved in your care Any changes since last visit?  yes   Family History  Problem Relation Age of Onset  . Cancer Mother     multiple myeloma  . Diabetes Father   . Kidney disease Father   . Heart disease Father    Social History   Social History  . Marital Status: Married    Spouse Name: N/A  . Number of Children: N/A  . Years of Education: N/A   Social History Main Topics  . Smoking status: Never  Smoker   . Smokeless tobacco: Never Used  . Alcohol Use: No  . Drug Use: No  . Sexual Activity: Not Asked   Other Topics Concern  . None   Social History Narrative   Past Surgical History  Procedure Laterality Date  . Small intestine surgery      cervical laminectomy  . Utereroscopy  1995  . Lithotripsy      multiple  . Shoulder surgery  6/06  . Cesarean section      x2  . Endoscopic plantar fasciotomy Right August 2015  . Dental implant  July 2015   Past Medical History  Diagnosis Date  . Neuromuscular disorder ([place])   . Diabetes mellitus   . Depression   . Hypertension   . Chronic kidney disease     stones   BP 121/61 mmHg  Pulse 80  Resp 16  SpO2 96%  Opioid Risk Score:   Fall Risk Score:  `1  Depression screen PHQ 2/9  Depression screen [facility] 2/9 07/25/2015 01/01/2015 08/12/2014  Decreased Interest _0 Down, Depressed, Hopeless _1 PHQ - 2 Score _2 Altered sleeping - - 1  Tired, decreased energy - - 3  Change in appetite - - 2  Feeling bad or failure about yourself  - - 3  Trouble concentrating - - 2  Moving  slowly or fidgety/restless - - 1  Suicidal thoughts - - 0  PHQ-9 Score - - 14     Review of Systems     Objective:   Physical Exam  Constitutional: She is oriented to person, place, and time. She appears well-developed and well-nourished.  HENT:  Head: Normocephalic and atraumatic.  Neck: Normal range of motion. Neck supple.  Cardiovascular: Normal rate and regular rhythm.   Pulmonary/Chest: Effort normal and breath sounds normal.  Musculoskeletal:  Normal Muscle Bulk and Muscle Testing Reveals: Upper Extremities: Full ROM and Muscle Strength 5/5 Left AC Joint Tenderness Lower Extremities: Full ROM and Muscle Strength 5/5 Arises from chair with ease Narrow Based Gait  Neurological: She is alert and oriented to person, place, and time.  Skin: Skin is warm and dry.  Psychiatric: She has a normal mood and affect.  Nursing note  and vitals reviewed.         Assessment & Plan:  1.Complex regional pain syndrome right lower extremity postoperative. She's s/p post plantar fascial release. She has sensitivity touch. She has diffuse numbness and tingling. Continue Gabapentin. Refilled: Nucynta 100 mg one tablet four times a day. #120. We will continue the opioid monitoring program, this consists of regular clinic visits, examinations, urine drug screen, pill counts as well as use of [place] Controlled Substance reporting System. 2.Myofascial pain syndrome: Continue with ice, heat and exercise regime.  3. Depression: Continue: Zoloft 4. Muscle Spasm: Continue Tizanidine  20 minutes of face to face patient care time was spent during this visit. All questions was encouraged and answered.  F/U in 1 month

## 2015-11-05 LAB — TOXASSURE SELECT,+ANTIDEPR,UR: PDF: 0

## 2015-11-13 NOTE — Progress Notes (Signed)
Urine drug screen for this encounter is consistent for prescribed medication 

## 2015-11-20 ENCOUNTER — Other Ambulatory Visit: Payer: Self-pay | Admitting: Physical Medicine & Rehabilitation

## 2015-12-02 ENCOUNTER — Ambulatory Visit (HOSPITAL_BASED_OUTPATIENT_CLINIC_OR_DEPARTMENT_OTHER): Payer: BLUE CROSS/BLUE SHIELD | Admitting: Physical Medicine & Rehabilitation

## 2015-12-02 ENCOUNTER — Encounter: Payer: Self-pay | Admitting: Physical Medicine & Rehabilitation

## 2015-12-02 ENCOUNTER — Encounter: Payer: BLUE CROSS/BLUE SHIELD | Attending: Physical Medicine & Rehabilitation

## 2015-12-02 VITALS — BP 104/71 | HR 98 | Resp 17

## 2015-12-02 DIAGNOSIS — G90521 Complex regional pain syndrome I of right lower limb: Secondary | ICD-10-CM | POA: Diagnosis not present

## 2015-12-02 DIAGNOSIS — M797 Fibromyalgia: Secondary | ICD-10-CM | POA: Insufficient documentation

## 2015-12-02 DIAGNOSIS — Z5181 Encounter for therapeutic drug level monitoring: Secondary | ICD-10-CM | POA: Insufficient documentation

## 2015-12-02 DIAGNOSIS — M961 Postlaminectomy syndrome, not elsewhere classified: Secondary | ICD-10-CM | POA: Diagnosis not present

## 2015-12-02 DIAGNOSIS — M5412 Radiculopathy, cervical region: Secondary | ICD-10-CM | POA: Diagnosis not present

## 2015-12-02 DIAGNOSIS — M1711 Unilateral primary osteoarthritis, right knee: Secondary | ICD-10-CM | POA: Diagnosis not present

## 2015-12-02 MED ORDER — TRAMADOL HCL 50 MG PO TABS: 50.0000 mg | ORAL_TABLET | Freq: Four times a day (QID) | ORAL | Status: DC

## 2015-12-02 NOTE — Patient Instructions (Addendum)
Your surgeons will likely prescribe oxycodone or hydrocodone post operatively  Please call us when you know the date of her surgery and then you can schedule an appointment 4-6 weeks after your surgery

## 2015-12-02 NOTE — Progress Notes (Signed)
Subjective:    Patient ID: Sabrina Williams, female    DOB: 07-17-1970, 45 y.o.   MRN: [id number]  HPI A 45 year old female who is been followed in this clinic for a number of years primarily for cervical postlaminectomy syndrome and chronic C6 radiculopathy. She is also had myofascial pain in the left upper quadrant. Over the years she has developed other painful conditions which have been mainly related to her morbid obesity. She has plantar fasciitis and has undergone surgery and developed subsequent reflex sympathetic dystrophy. In addition she has developed knee osteoarthritis and has recently had good relief of left knee pain after knee injection performed by Dr. Noemi Chapel from orthopedic surgery a few weeks ago. Her right knee is her major concern at the current time this is limiting her ambulation. Pain Inventory Average Pain 5 Pain Right Now 4 My pain is constant, sharp, burning, dull, stabbing, tingling and aching  In the last 24 hours, has pain interfered with the following? General activity 7 Relation with others 5 Enjoyment of life 6 What TIME of day is your pain at its worst? evening, night Sleep (in general) Fair  Pain is worse with: walking, standing and some activites Pain improves with: rest, heat/ice and medication Relief from Meds: 7  Mobility walk without assistance how many minutes can you walk? 20 ability to climb steps?  yes do you drive?  yes Do you have any goals in this area?  yes  Function I need assistance with the following:  meal prep, household duties and shopping Do you have any goals in this area?  no  Neuro/Psych No problems in this area  Prior Studies Any changes since last visit?  no  Physicians involved in your care Any changes since last visit?  no   Family History  Problem Relation Age of Onset  . Cancer Mother     multiple myeloma  . Diabetes Father   . Kidney disease Father   . Heart disease Father    Social History    Social History  . Marital Status: Married    Spouse Name: N/A  . Number of Children: N/A  . Years of Education: N/A   Social History Main Topics  . Smoking status: Never Smoker   . Smokeless tobacco: Never Used  . Alcohol Use: No  . Drug Use: No  . Sexual Activity: Not Asked   Other Topics Concern  . None   Social History Narrative   Past Surgical History  Procedure Laterality Date  . Small intestine surgery      cervical laminectomy  . Utereroscopy  1995  . Lithotripsy      multiple  . Shoulder surgery  6/06  . Cesarean section      x2  . Endoscopic plantar fasciotomy Right August 2015  . Dental implant  July 2015   Past Medical History  Diagnosis Date  . Neuromuscular disorder ([place])   . Diabetes mellitus   . Depression   . Hypertension   . Chronic kidney disease     stones   BP 104/71 mmHg  Pulse 98  Resp 17  SpO2 92%  LMP 11/02/2015 (Approximate)  Opioid Risk Score:   Fall Risk Score:  `1  Depression screen PHQ 2/9  Depression screen [facility] 2/9 07/25/2015 01/01/2015 08/12/2014  Decreased Interest _0 Down, Depressed, Hopeless _1 PHQ - 2 Score _2 Altered sleeping - - 1  Tired, decreased energy - -  3  Change in appetite - - 2  Feeling bad or failure about yourself  - - 3  Trouble concentrating - - 2  Moving slowly or fidgety/restless - - 1  Suicidal thoughts - - 0  PHQ-9 Score - - 14     Review of Systems  Constitutional: Positive for unexpected weight change.  Musculoskeletal: Positive for gait problem.  Skin: Positive for rash.  All other systems reviewed and are negative.      Objective:   Physical Exam  Constitutional: She is oriented to person, place, and time. She appears well-developed and well-nourished.  HENT:  Head: Normocephalic and atraumatic.  Eyes: Conjunctivae and EOM are normal. Pupils are equal, round, and reactive to light.  Musculoskeletal:       Right knee: She exhibits normal range of motion, no swelling  and no effusion. Tenderness found. Lateral joint line and patellar tendon tenderness noted.       Left knee: She exhibits normal range of motion, no swelling and no effusion. No tenderness found.  Neurological: She is alert and oriented to person, place, and time.  Psychiatric: She has a normal mood and affect.  Nursing note and vitals reviewed.   Right knee no evidence of effusion no erythema. She has mild pain with range of motion. Left knee has no edema no evidence of effusion no erythema. No pain with range of motion.       Assessment & Plan:  1.  Plantar fasciitis with surgery- January 15, 2014 with post op CRPS 1, Her symptoms have improved to a great degree with treatment which has included PT, oral medications as well as 2 lumbar Sympathetic injections. At this point I no further injections are needed. Insurance is no longer covering Nucynta because of how many tablets she has received over the course of the year. She had been taking  Nucynta 100 mg 4 times a day. We'll try switching back to tramadol 50 mg 4 times a day. There is minimal withdrawal symptoms with Nucynta so I do not expect any issues with that.  Patient is planning to have upcoming lap band surgery for weight loss.  Postoperatively surgery will manage her pain at least for the first month or so. I anticipate she'll be placed on either hydrocodone or Oxycodone postoperatively  2. Cervical postlaminectomy syndrome with chronic C6 radiculitis, Continue gabapentin 600 mg 4 times a day, Continue Zanaflex 4 mg 4 times a day  3. Left knee osteoarthritis improved after injection now has more symptoms on the right side. We'll do right knee injection today  Knee injection   Indication:Right Knee pain not relieved by medication management and other conservative care.  Informed consent was obtained after describing risks and benefits of the procedure with the patient, this includes bleeding, bruising, infection and medication  side effects. The patient wishes to proceed and has given written consent. The patient was placed in a recumbent position. The medial aspect of the knee was marked and prepped with Betadine and alcohol. It was then entered with a 25-gauge 1-1/2 inch needle was inserted into the knee joint. After negative draw back for blood, a solution containing one ML of 24m per mL betamethasone and 3 mL of 1% lidocaine were injected. The patient tolerated the procedure well. Post procedure instructions were given.

## 2015-12-04 NOTE — Progress Notes (Signed)
Prior Authorization........ Nucynta 100 mg.......... approved

## 2016-01-01 HISTORY — PX: PARTIAL GASTRECTOMY: SHX2172

## 2016-01-27 ENCOUNTER — Ambulatory Visit: Payer: BLUE CROSS/BLUE SHIELD | Admitting: Physical Medicine & Rehabilitation

## 2016-01-29 ENCOUNTER — Encounter: Payer: BLUE CROSS/BLUE SHIELD | Attending: Physical Medicine & Rehabilitation | Admitting: Registered Nurse

## 2016-01-29 ENCOUNTER — Encounter: Payer: Self-pay | Admitting: Registered Nurse

## 2016-01-29 VITALS — BP 104/72 | HR 74

## 2016-01-29 DIAGNOSIS — M62830 Muscle spasm of back: Secondary | ICD-10-CM

## 2016-01-29 DIAGNOSIS — M797 Fibromyalgia: Secondary | ICD-10-CM | POA: Diagnosis not present

## 2016-01-29 DIAGNOSIS — M5412 Radiculopathy, cervical region: Secondary | ICD-10-CM | POA: Insufficient documentation

## 2016-01-29 DIAGNOSIS — M7918 Myalgia, other site: Secondary | ICD-10-CM

## 2016-01-29 DIAGNOSIS — M961 Postlaminectomy syndrome, not elsewhere classified: Secondary | ICD-10-CM | POA: Diagnosis present

## 2016-01-29 DIAGNOSIS — G90521 Complex regional pain syndrome I of right lower limb: Secondary | ICD-10-CM | POA: Insufficient documentation

## 2016-01-29 DIAGNOSIS — M545 Low back pain, unspecified: Secondary | ICD-10-CM

## 2016-01-29 DIAGNOSIS — Z79899 Other long term (current) drug therapy: Secondary | ICD-10-CM

## 2016-01-29 DIAGNOSIS — Z5181 Encounter for therapeutic drug level monitoring: Secondary | ICD-10-CM | POA: Diagnosis not present

## 2016-01-29 MED ORDER — TAPENTADOL HCL 100 MG PO TABS: 1.0000 | ORAL_TABLET | Freq: Four times a day (QID) | ORAL | 0 refills | Status: DC | PRN

## 2016-01-29 NOTE — Progress Notes (Signed)
Subjective:    Patient ID: Sabrina Williams, female    DOB: 02-03-1971, 45 y.o.   MRN: [id number]  HPI:  Sabrina Williams is a 45 year old female who returns for follow up for chronic pain and medication refill. She states her pain is located in her left hand, right foot and lower back. She rates her pain 2. Her current exercise regime is walking short distances, performing stretching exercises and light house work.  Her Insurance denies coverage of Tramadol, they will cover her Nucynta will re-order today, she verbalizes understanding.  S/P Lap Band Surgery on August 10th,2017. S/P Right Knee Injection with relief noted  Pain Inventory Average Pain 5 Pain Right Now 2 My pain is constant, sharp, burning, dull, tingling and aching  In the last 24 hours, has pain interfered with the following? General activity 5 Relation with others 3 Enjoyment of life 3 What TIME of day is your pain at its worst? evening Sleep (in general) Good  Pain is worse with: walking and some activites Pain improves with: rest, heat/ice and medication Relief from Meds: 7  Mobility walk without assistance how many minutes can you walk? 30 ability to climb steps?  yes do you drive?  yes Do you have any goals in this area?  yes  Function not employed: date last employed . I need assistance with the following:  meal prep, household duties and shopping Do you have any goals in this area?  no  Neuro/Psych No problems in this area  Prior Studies Any changes since last visit?  no  Physicians involved in your care Any changes since last visit?  no   Family History  Problem Relation Age of Onset  . Cancer Mother     multiple myeloma  . Diabetes Father   . Kidney disease Father   . Heart disease Father    Social History   Social History  . Marital status: Married    Spouse name: N/A  . Number of children: N/A  . Years of education: N/A   Social History Main Topics  . Smoking status:  Never Smoker  . Smokeless tobacco: Never Used  . Alcohol use No  . Drug use: No  . Sexual activity: Not Asked   Other Topics Concern  . None   Social History Narrative  . None   Past Surgical History:  Procedure Laterality Date  . CESAREAN SECTION     x2  . dental implant  July 2015  . ENDOSCOPIC PLANTAR FASCIOTOMY Right August 2015  . LITHOTRIPSY     multiple  . SHOULDER SURGERY  6/06  . SMALL INTESTINE SURGERY     cervical laminectomy  . utereroscopy  1995   Past Medical History:  Diagnosis Date  . Chronic kidney disease    stones  . Depression   . Diabetes mellitus   . Hypertension   . Neuromuscular disorder (HCC)    BP 104/72 (BP Location: Right Arm, Patient Position: Sitting, Cuff Size: Large)   Pulse 74   SpO2 97%   Opioid Risk Score:   Fall Risk Score:  `1  Depression screen PHQ 2/9  Depression screen [facility] 2/9 07/25/2015 01/01/2015 08/12/2014  Decreased Interest _0 Down, Depressed, Hopeless _1 PHQ - 2 Score _2 Altered sleeping - - 1  Tired, decreased energy - - 3  Change in appetite - - 2  Feeling bad or failure about yourself  - -  3  Trouble concentrating - - 2  Moving slowly or fidgety/restless - - 1  Suicidal thoughts - - 0  PHQ-9 Score - - 14    Review of Systems  Constitutional: Positive for unexpected weight change.  HENT: Negative.   Eyes: Negative.   Respiratory: Negative.   Cardiovascular: Negative.   Gastrointestinal: Negative.   Endocrine: Negative.   Genitourinary: Negative.   Musculoskeletal: Negative.   Skin: Positive for rash.  Neurological: Negative.   Hematological: Negative.   Psychiatric/Behavioral: Negative.   All other systems reviewed and are negative.      Objective:   Physical Exam  Constitutional: She is oriented to person, place, and time. She appears well-developed and well-nourished.  HENT:  Head: Normocephalic and atraumatic.  Neck: Normal range of motion. Neck supple.  Cardiovascular: Normal  rate and regular rhythm.   Pulmonary/Chest: Effort normal and breath sounds normal.  Musculoskeletal:  Normal Muscle Bulk and Muscle Testing Reveals: Upper Extremities: Full ROM and Muscle Strength 5/5 Back without spinal tenderness noted Lower Extremities: Full ROM and Muscle Strength 5/5 Arises from Table with ease Narrow Based Gait   Neurological: She is alert and oriented to person, place, and time.  Skin: Skin is warm and dry.  Psychiatric: She has a normal mood and affect.  Nursing note and vitals reviewed.         Assessment & Plan:  1.Complex regional pain syndrome right lower extremity postoperative. She's s/p post plantar fascial release. She continue with sensitivity to touch. She has diffuse numbness and tingling. Continue Gabapentin. Refilled: Nucynta 100 mg one tablet four times a day. #120. We will continue the opioid monitoring program, this consists of regular clinic visits, examinations, urine drug screen, pill counts as well as use of [place] Controlled Substance reporting System. 2.Myofascial pain syndrome: Continue with ice, heat and exercise regime.  3. Depression: Continue: Zoloft 4. Muscle Spasm: Continue Tizanidine  20 minutes of face to face patient care time was spent during this visit. All questions was encouraged and answered.  F/U in 1 month

## 2016-02-23 ENCOUNTER — Encounter: Payer: BLUE CROSS/BLUE SHIELD | Attending: Physical Medicine & Rehabilitation | Admitting: Registered Nurse

## 2016-02-23 ENCOUNTER — Encounter: Payer: Self-pay | Admitting: Registered Nurse

## 2016-02-23 VITALS — BP 176/119 | HR 51

## 2016-02-23 DIAGNOSIS — M797 Fibromyalgia: Secondary | ICD-10-CM | POA: Insufficient documentation

## 2016-02-23 DIAGNOSIS — Z5181 Encounter for therapeutic drug level monitoring: Secondary | ICD-10-CM | POA: Diagnosis not present

## 2016-02-23 DIAGNOSIS — I10 Essential (primary) hypertension: Secondary | ICD-10-CM

## 2016-02-23 DIAGNOSIS — G90521 Complex regional pain syndrome I of right lower limb: Secondary | ICD-10-CM | POA: Insufficient documentation

## 2016-02-23 DIAGNOSIS — G609 Hereditary and idiopathic neuropathy, unspecified: Secondary | ICD-10-CM | POA: Diagnosis not present

## 2016-02-23 DIAGNOSIS — M791 Myalgia: Secondary | ICD-10-CM | POA: Diagnosis not present

## 2016-02-23 DIAGNOSIS — M545 Low back pain, unspecified: Secondary | ICD-10-CM

## 2016-02-23 DIAGNOSIS — M79602 Pain in left arm: Secondary | ICD-10-CM | POA: Diagnosis not present

## 2016-02-23 DIAGNOSIS — M7918 Myalgia, other site: Secondary | ICD-10-CM

## 2016-02-23 DIAGNOSIS — M5412 Radiculopathy, cervical region: Secondary | ICD-10-CM | POA: Diagnosis not present

## 2016-02-23 DIAGNOSIS — M961 Postlaminectomy syndrome, not elsewhere classified: Secondary | ICD-10-CM | POA: Insufficient documentation

## 2016-02-23 MED ORDER — TAPENTADOL HCL 100 MG PO TABS: 1.0000 | ORAL_TABLET | Freq: Four times a day (QID) | ORAL | 0 refills | Status: DC | PRN

## 2016-02-23 NOTE — Progress Notes (Signed)
Subjective:    Patient ID: Sabrina Williams, female    DOB: 1971/03/21, 45 y.o.   MRN: [id number]  HPI: [name] is a 45 year old female who returns for follow up for chronic pain and medication refill. She states her pain is located in her left hand with tingling  and lower back occasionally with increase activity. She rates her pain 3. Her current exercise regime is walking short distances, performing stretching exercises and light house work. Sabrina Williams arrived hypertensive, blood pressure checked several times, she refuses ED evaluation. She states her PCP Dr. Kandice Robinsons had discontinued her anti-hypertensive's few months ago due to hypotension. Placed a call to Dr. Kandice Robinsons office spoke with Dr. Kandice Robinsons he has resumed her benazepril, Sabrina Williams verbalizes understanding.   S/P Lap Band Surgery on August 10th,2017.  Pain Inventory Average Pain 4 Pain Right Now 3 My pain is constant, sharp, burning, dull, stabbing, tingling and aching  In the last 24 hours, has pain interfered with the following? General activity 4 Relation with others 1 Enjoyment of life 1 What TIME of day is your pain at its worst? evening Sleep (in general) Good  Pain is worse with: walking, standing and some activites Pain improves with: rest and heat/ice Relief from Meds: 7  Mobility walk without assistance how many minutes can you walk? 30 ability to climb steps?  yes do you drive?  yes Do you have any goals in this area?  yes  Function not employed: date last employed . I need assistance with the following:  meal prep, household duties and shopping Do you have any goals in this area?  yes  Neuro/Psych numbness tingling  Prior Studies Any changes since last visit?  no  Physicians involved in your care Any changes since last visit?  no   Family History  Problem Relation Age of Onset  . Cancer Mother     multiple myeloma  . Diabetes Father   . Kidney disease Father   . Heart disease  Father    Social History   Social History  . Marital status: Married    Spouse name: N/A  . Number of children: N/A  . Years of education: N/A   Social History Main Topics  . Smoking status: Never Smoker  . Smokeless tobacco: Never Used  . Alcohol use No  . Drug use: No  . Sexual activity: Not Asked   Other Topics Concern  . None   Social History Narrative  . None   Past Surgical History:  Procedure Laterality Date  . CESAREAN SECTION     x2  . dental implant  July 2015  . ENDOSCOPIC PLANTAR FASCIOTOMY Right August 2015  . LITHOTRIPSY     multiple  . SHOULDER SURGERY  6/06  . SMALL INTESTINE SURGERY     cervical laminectomy  . utereroscopy  1995   Past Medical History:  Diagnosis Date  . Chronic kidney disease    stones  . Depression   . Diabetes mellitus   . Hypertension   . Neuromuscular disorder (HCC)    BP (!) 165/103   Pulse (!) 58   SpO2 96%   Opioid Risk Score:   Fall Risk Score:  `1  Depression screen PHQ 2/9  Depression screen [facility] 2/9 07/25/2015 01/01/2015 08/12/2014  Decreased Interest '1 1 1  '$ Down, Depressed, Hopeless '1 1 1  '$ PHQ - 2 Score '2 2 2  '$ Altered sleeping - - 1  Tired, decreased  energy - - 3  Change in appetite - - 2  Feeling bad or failure about yourself  - - 3  Trouble concentrating - - 2  Moving slowly or fidgety/restless - - 1  Suicidal thoughts - - 0  PHQ-9 Score - - 14    Review of Systems  Constitutional: Negative.   HENT: Negative.   Eyes: Negative.   Respiratory: Negative.   Cardiovascular: Negative.   Gastrointestinal: Negative.   Endocrine: Negative.   Genitourinary: Negative.   Musculoskeletal: Negative.   Skin: Negative.   Allergic/Immunologic: Negative.   Neurological: Positive for numbness.  Hematological: Negative.   Psychiatric/Behavioral: Negative.        Objective:   Physical Exam  Constitutional: She is oriented to person, place, and time. She appears well-developed and well-nourished.  HENT:    Head: Normocephalic and atraumatic.  Neck: Normal range of motion. Neck supple.  Cardiovascular: Normal rate and regular rhythm.   Pulmonary/Chest: Effort normal and breath sounds normal.  Musculoskeletal:  Norma;l Muscle Bulk and Muscle Testing Reveals: Upper Extremities: Full ROM and Muscle Strength 4/5 Lower Extremities: Full ROM and Muscle Strength 5/5 Arises from table with ease Narrow Based Gait   Neurological: She is alert and oriented to person, place, and time.  Skin: Skin is warm and dry.  Psychiatric: She has a normal mood and affect.  Nursing note and vitals reviewed.         Assessment & Plan:  1.Complex regional pain syndrome right lower extremity postoperative. She's s/p post plantar fascial release. She continue with sensitivity to touch. She has diffuse numbness and tingling. Continue Gabapentin. Refilled: Nucynta 100 mg one tablet four times a day. #120. We will continue the opioid monitoring program, this consists of regular clinic visits, examinations, urine drug screen, pill counts as well as use of [place] Controlled Substance reporting System. 2.Myofascial pain syndrome: Continue with ice, heat and exercise regime.  3. Depression: Continue: Zoloft 4. Muscle Spasm: Continue Tizanidine 5. Uncontrolled HTN: Refuses ED evaluation. Spoke with Dr. Stephens Shire her PCP he has resumed her benazepril.  Also instructed to keep a blood pressure log she verbalizes understanding.   20 minutes of face to face patient care time was spent during this visit. All questions was encouraged and answered.  F/U in 1 month

## 2016-02-24 ENCOUNTER — Other Ambulatory Visit: Payer: Self-pay | Admitting: Registered Nurse

## 2016-03-29 ENCOUNTER — Encounter: Payer: BLUE CROSS/BLUE SHIELD | Attending: Physical Medicine & Rehabilitation

## 2016-03-29 ENCOUNTER — Encounter: Payer: Self-pay | Admitting: Physical Medicine & Rehabilitation

## 2016-03-29 ENCOUNTER — Ambulatory Visit (HOSPITAL_BASED_OUTPATIENT_CLINIC_OR_DEPARTMENT_OTHER): Payer: BLUE CROSS/BLUE SHIELD | Admitting: Physical Medicine & Rehabilitation

## 2016-03-29 VITALS — BP 101/72 | HR 118 | Resp 14

## 2016-03-29 DIAGNOSIS — M7918 Myalgia, other site: Secondary | ICD-10-CM

## 2016-03-29 DIAGNOSIS — M5412 Radiculopathy, cervical region: Secondary | ICD-10-CM | POA: Insufficient documentation

## 2016-03-29 DIAGNOSIS — M791 Myalgia: Secondary | ICD-10-CM

## 2016-03-29 DIAGNOSIS — M797 Fibromyalgia: Secondary | ICD-10-CM | POA: Insufficient documentation

## 2016-03-29 DIAGNOSIS — G90521 Complex regional pain syndrome I of right lower limb: Secondary | ICD-10-CM | POA: Insufficient documentation

## 2016-03-29 DIAGNOSIS — Z5181 Encounter for therapeutic drug level monitoring: Secondary | ICD-10-CM | POA: Diagnosis not present

## 2016-03-29 DIAGNOSIS — M961 Postlaminectomy syndrome, not elsewhere classified: Secondary | ICD-10-CM

## 2016-03-29 MED ORDER — GABAPENTIN 600 MG PO TABS: ORAL_TABLET | ORAL | 3 refills | Status: DC

## 2016-03-29 MED ORDER — TAPENTADOL HCL 100 MG PO TABS: 1.0000 | ORAL_TABLET | Freq: Four times a day (QID) | ORAL | 0 refills | Status: DC | PRN

## 2016-03-29 NOTE — Patient Instructions (Signed)
Consider taper of pain med, may consider tizanidine

## 2016-03-29 NOTE — Progress Notes (Signed)
Subjective:    Patient ID: Sabrina Williams, female    DOB: 1971-01-21, 45 y.o.   MRN: [id number]  HPI Bariatric surgery in May 2017, has lost 80lb, halfway to goal.  Kidney fxn declined  No longer takes diabetic meds  Some reduced pain in RIght foot and knee. Neck pain doing a little better occ Left index finger nerve pain Pain Inventory Average Pain 4 Pain Right Now 4 My pain is constant, sharp, burning, dull, stabbing, tingling and aching  In the last 24 hours, has pain interfered with the following? General activity 5 Relation with others 4 Enjoyment of life 3 What TIME of day is your pain at its worst? evening Sleep (in general) Good  Pain is worse with: walking, bending, standing and some activites Pain improves with: rest, heat/ice and medication Relief from Meds: 7  Mobility walk without assistance how many minutes can you walk? 30 ability to climb steps?  yes do you drive?  yes Do you have any goals in this area?  yes  Function not employed: date last employed n/a Do you have any goals in this area?  yes  Neuro/Psych numbness tingling  Prior Studies Any changes since last visit?  no  Physicians involved in your care Any changes since last visit?  no   Family History  Problem Relation Age of Onset  . Cancer Mother     multiple myeloma  . Diabetes Father   . Kidney disease Father   . Heart disease Father    Social History   Social History  . Marital status: Married    Spouse name: N/A  . Number of children: N/A  . Years of education: N/A   Social History Main Topics  . Smoking status: Never Smoker  . Smokeless tobacco: Never Used  . Alcohol use No  . Drug use: No  . Sexual activity: Not Asked   Other Topics Concern  . None   Social History Narrative  . None   Past Surgical History:  Procedure Laterality Date  . CESAREAN SECTION     x2  . dental implant  July 2015  . ENDOSCOPIC PLANTAR FASCIOTOMY Right August 2015  .  LITHOTRIPSY     multiple  . SHOULDER SURGERY  6/06  . SMALL INTESTINE SURGERY     cervical laminectomy  . utereroscopy  1995   Past Medical History:  Diagnosis Date  . Chronic kidney disease    stones  . Depression   . Diabetes mellitus   . Hypertension   . Neuromuscular disorder (HCC)    BP 101/72   Pulse (!) 118   Resp 14   SpO2 96%   Opioid Risk Score:   Fall Risk Score:  `1  Depression screen PHQ 2/9  Depression screen [facility] 2/9 07/25/2015 01/01/2015 08/12/2014  Decreased Interest '1 1 1  '$ Down, Depressed, Hopeless '1 1 1  '$ PHQ - 2 Score '2 2 2  '$ Altered sleeping - - 1  Tired, decreased energy - - 3  Change in appetite - - 2  Feeling bad or failure about yourself  - - 3  Trouble concentrating - - 2  Moving slowly or fidgety/restless - - 1  Suicidal thoughts - - 0  PHQ-9 Score - - 14    Review of Systems  All other systems reviewed and are negative.      Objective:   Physical Exam  Constitutional: She is oriented to person, place, and time. She appears well-developed.  Obese female NAD  HENT:  Head: Normocephalic and atraumatic.  Eyes: Conjunctivae and EOM are normal. Pupils are equal, round, and reactive to light.  Neck: Normal range of motion. No JVD present.  Lymphadenopathy:    She has no cervical adenopathy.  Neurological: She is alert and oriented to person, place, and time. She exhibits normal muscle tone. Coordination normal.  5/5 strength bilateral deltoid, biceps, triceps, grip, hip flexor, knee extensor, ankle dorsiflexor and plantar flexor  Sensation intact to pinprick bilateral upper limbs  Psychiatric: She has a normal mood and affect. Her speech is normal and behavior is normal. Judgment and thought content normal. Cognition and memory are normal.  Nursing note and vitals reviewed.         Assessment & Plan:  1.  Plantar fasciitis with surgery- January 15, 2014 with post op CRPS 1, Her symptoms have improved to a great degree with treatment  which has included PT, oral medications as well as 2 lumbar Sympathetic injections. At this point I no further injections are needed.            Nucynta 100 mg 4 times a day.  Gabapentin '600mg'$  QID Tizanidine '4mg'$  5 x per day We discussed potentially starting to taper tizanidine at next M.D. visit in about 6 months.  Will have nurse practitioner visit one month   2. 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  Right knee OA symptoms improving with wt loss

## 2016-03-31 ENCOUNTER — Telehealth: Payer: Self-pay | Admitting: *Deleted

## 2016-03-31 ENCOUNTER — Telehealth: Payer: Self-pay | Admitting: Physical Medicine & Rehabilitation

## 2016-03-31 NOTE — Telephone Encounter (Signed)
Prior authorization submitted to [facility] for Nucynta 100 mg 4 times a day

## 2016-03-31 NOTE — Telephone Encounter (Signed)
Sabrina Williams with [facility] called to let us know that patient's Nucynta has been approved, was sent to pharmacy.

## 2016-04-01 NOTE — Telephone Encounter (Signed)
Patient is aware of Nucynta approved and sent to pharmacy, patient states the medicine co pay is too high. Patient son is coming to office to get Nucynta Prior Authorization (nucynta savings card).    Confiriming meds has been sent to pharamcy

## 2016-04-26 ENCOUNTER — Encounter: Payer: Self-pay | Admitting: Registered Nurse

## 2016-04-26 ENCOUNTER — Encounter: Payer: BLUE CROSS/BLUE SHIELD | Attending: Physical Medicine & Rehabilitation | Admitting: Registered Nurse

## 2016-04-26 VITALS — BP 140/91 | HR 69

## 2016-04-26 DIAGNOSIS — M545 Low back pain, unspecified: Secondary | ICD-10-CM

## 2016-04-26 DIAGNOSIS — M797 Fibromyalgia: Secondary | ICD-10-CM | POA: Insufficient documentation

## 2016-04-26 DIAGNOSIS — Z5181 Encounter for therapeutic drug level monitoring: Secondary | ICD-10-CM | POA: Diagnosis not present

## 2016-04-26 DIAGNOSIS — G90521 Complex regional pain syndrome I of right lower limb: Secondary | ICD-10-CM | POA: Diagnosis not present

## 2016-04-26 DIAGNOSIS — Z79899 Other long term (current) drug therapy: Secondary | ICD-10-CM

## 2016-04-26 DIAGNOSIS — M7918 Myalgia, other site: Secondary | ICD-10-CM

## 2016-04-26 DIAGNOSIS — M791 Myalgia: Secondary | ICD-10-CM

## 2016-04-26 DIAGNOSIS — M961 Postlaminectomy syndrome, not elsewhere classified: Secondary | ICD-10-CM | POA: Insufficient documentation

## 2016-04-26 DIAGNOSIS — M5412 Radiculopathy, cervical region: Secondary | ICD-10-CM | POA: Insufficient documentation

## 2016-04-26 DIAGNOSIS — G609 Hereditary and idiopathic neuropathy, unspecified: Secondary | ICD-10-CM

## 2016-04-26 MED ORDER — TAPENTADOL HCL 100 MG PO TABS: 1.0000 | ORAL_TABLET | Freq: Four times a day (QID) | ORAL | 0 refills | Status: DC | PRN

## 2016-04-26 NOTE — Progress Notes (Signed)
Subjective:    Patient ID: Sabrina Williams, female    DOB: 06/03/1970, 45 y.o.   MRN: [id number]  HPI:  Sabrina Williams is a 45 year old female who returns for follow up for chronic pain and medication refill. She states her pain is located in her left hand with tingling,ower back occasionally with increase activity, right knee and right foot. She rates her pain 3. Her current exercise regime is walking short distances,performing stretching exercises and light house work.  S/P Lap Band Surgery on August 10th,2017, she has  lost 90 lbs she states.   Pain Inventory Average Pain 4 Pain Right Now 3 My pain is constant, sharp, burning, dull, stabbing, tingling and aching  In the last 24 hours, has pain interfered with the following? General activity 5 Relation with others 2 Enjoyment of life 4 What TIME of day is your pain at its worst? evening Sleep (in general) Good  Pain is worse with: walking, standing and some activites Pain improves with: rest, heat/ice, therapy/exercise and medication Relief from Meds: 7  Mobility walk without assistance ability to climb steps?  yes do you drive?  yes Do you have any goals in this area?  yes  Function not employed: date last employed . I need assistance with the following:  meal prep, household duties and shopping  Neuro/Psych No problems in this area  Prior Studies Any changes since last visit?  no  Physicians involved in your care Any changes since last visit?  no   Family History  Problem Relation Age of Onset  . Cancer Mother     multiple myeloma  . Diabetes Father   . Kidney disease Father   . Heart disease Father    Social History   Social History  . Marital status: Married    Spouse name: N/A  . Number of children: N/A  . Years of education: N/A   Social History Main Topics  . Smoking status: Never Smoker  . Smokeless tobacco: Never Used  . Alcohol use No  . Drug use: No  . Sexual activity: Not on  file   Other Topics Concern  . Not on file   Social History Narrative  . No narrative on file   Past Surgical History:  Procedure Laterality Date  . CESAREAN SECTION     x2  . dental implant  July 2015  . ENDOSCOPIC PLANTAR FASCIOTOMY Right August 2015  . LITHOTRIPSY     multiple  . SHOULDER SURGERY  6/06  . SMALL INTESTINE SURGERY     cervical laminectomy  . utereroscopy  1995   Past Medical History:  Diagnosis Date  . Chronic kidney disease    stones  . Depression   . Diabetes mellitus   . Hypertension   . Neuromuscular disorder ([place])    There were no vitals taken for this visit.  Opioid Risk Score:   Fall Risk Score:  `1  Depression screen PHQ 2/9  Depression screen [facility] 2/9 07/25/2015 01/01/2015 08/12/2014  Decreased Interest _0 Down, Depressed, Hopeless _1 PHQ - 2 Score _2 Altered sleeping - - 1  Tired, decreased energy - - 3  Change in appetite - - 2  Feeling bad or failure about yourself  - - 3  Trouble concentrating - - 2  Moving slowly or fidgety/restless - - 1  Suicidal thoughts - - 0  PHQ-9 Score - - 14  Review of Systems  Constitutional: Negative.   HENT: Negative.   Eyes: Negative.   Respiratory: Negative.   Cardiovascular: Negative.   Gastrointestinal: Negative.   Endocrine: Negative.   Genitourinary: Negative.   Musculoskeletal: Positive for arthralgias and joint swelling.  Skin: Negative.   Allergic/Immunologic: Negative.   Neurological: Negative.   Hematological: Negative.   Psychiatric/Behavioral: Negative.   All other systems reviewed and are negative.      Objective:   Physical Exam  Constitutional: She is oriented to person, place, and time. She appears well-developed and well-nourished.  HENT:  Head: Normocephalic and atraumatic.  Neck: Normal range of motion. Neck supple.  Cardiovascular: Normal rate and regular rhythm.   Pulmonary/Chest: Effort normal and breath sounds normal.  Musculoskeletal:  Normal  Muscle Bulk and Muscle Testing Reveals: Upper Extremities: Full ROM and Muscle Strength 5/5 Left AC Joint Tenderness Lumbar Paraspinal Tenderness: L-3-L-5 Lower Extremities: Full ROM and Muscle Strength 5/5 Arises from Table slowly Narrow Based Gait   Neurological: She is alert and oriented to person, place, and time.  Skin: Skin is warm and dry.  Psychiatric: She has a normal mood and affect.  Nursing note and vitals reviewed.         Assessment & Plan:  1.Complex regional pain syndrome right lower extremity postoperative. She's s/p post plantar fascial release. She continue withsensitivity totouch. She has diffuse numbness and tingling. Continue Gabapentin. Refilled: Nucynta 100 mg one tablet four times a day. #120. We will continue the opioid monitoring program, this consists of regular clinic visits, examinations, urine drug screen, pill counts as well as use of [place] Controlled Substance reporting System. 2.Myofascial pain syndrome: Continue with ice, heat and exercise regime.  3. Depression: Continue: Zoloft 4. Muscle Spasm: Continue Tizanidine  20 minutes of face to face patient care time was spent during this visit. All questions was encouraged and answered.  F/U in 1 month

## 2016-05-01 LAB — TOXASSURE SELECT,+ANTIDEPR,UR

## 2016-05-06 NOTE — Progress Notes (Signed)
Urine drug screen for this encounter is consistent for prescribed medication 

## 2016-05-26 ENCOUNTER — Encounter: Payer: BLUE CROSS/BLUE SHIELD | Admitting: Registered Nurse

## 2016-06-01 ENCOUNTER — Encounter: Payer: Self-pay | Admitting: Registered Nurse

## 2016-06-01 ENCOUNTER — Encounter: Payer: BLUE CROSS/BLUE SHIELD | Attending: Physical Medicine & Rehabilitation | Admitting: Registered Nurse

## 2016-06-01 VITALS — BP 137/89 | HR 80

## 2016-06-01 DIAGNOSIS — M791 Myalgia: Secondary | ICD-10-CM

## 2016-06-01 DIAGNOSIS — M546 Pain in thoracic spine: Secondary | ICD-10-CM | POA: Diagnosis not present

## 2016-06-01 DIAGNOSIS — M5412 Radiculopathy, cervical region: Secondary | ICD-10-CM | POA: Diagnosis not present

## 2016-06-01 DIAGNOSIS — M961 Postlaminectomy syndrome, not elsewhere classified: Secondary | ICD-10-CM

## 2016-06-01 DIAGNOSIS — Z79899 Other long term (current) drug therapy: Secondary | ICD-10-CM

## 2016-06-01 DIAGNOSIS — G894 Chronic pain syndrome: Secondary | ICD-10-CM

## 2016-06-01 DIAGNOSIS — M797 Fibromyalgia: Secondary | ICD-10-CM | POA: Diagnosis not present

## 2016-06-01 DIAGNOSIS — G609 Hereditary and idiopathic neuropathy, unspecified: Secondary | ICD-10-CM

## 2016-06-01 DIAGNOSIS — Z5181 Encounter for therapeutic drug level monitoring: Secondary | ICD-10-CM | POA: Insufficient documentation

## 2016-06-01 DIAGNOSIS — M7918 Myalgia, other site: Secondary | ICD-10-CM

## 2016-06-01 DIAGNOSIS — G90521 Complex regional pain syndrome I of right lower limb: Secondary | ICD-10-CM | POA: Insufficient documentation

## 2016-06-01 MED ORDER — TAPENTADOL HCL 100 MG PO TABS: 1.0000 | ORAL_TABLET | Freq: Four times a day (QID) | ORAL | 0 refills | Status: DC | PRN

## 2016-06-01 NOTE — Progress Notes (Signed)
Subjective:    Patient ID: Sabrina Williams, female    DOB: June 21, 1970, 46 y.o.   MRN: [id number]  HPI: [name] is a 46 year old female who returns for follow up for chronic pain and medication refill. She states her pain is located in her upper- back, bilateral buttocks and right foot. She rates her pain 3. Her current exercise regime is walking short distances,performing stretching exercises and light house work.  Also states over a week ago she was at her sister's house sitting in a chair, the chair leg gave way and she landed on her buttocks. She  was able to pick herself up, she didn't seek medical attention. No ecchymosis noted.  Her mother in law passed away on 05-24-16, emotional support given.   S/P Lap Band Surgery on August 10th,2017, she has  lost 100 lbs she states.    Pain Inventory Average Pain 4 Pain Right Now 3 My pain is constant, sharp, burning, dull, stabbing, tingling and aching  In the last 24 hours, has pain interfered with the following? General activity 7 Relation with others 5 Enjoyment of life 4 What TIME of day is your pain at its worst? night Sleep (in general) Fair  Pain is worse with: walking, bending, sitting, standing and some activites Pain improves with: heat/ice and medication Relief from Meds: 6  Mobility walk without assistance ability to climb steps?  yes do you drive?  yes Do you have any goals in this area?  yes  Function not employed: date last employed . I need assistance with the following:  meal prep, household duties and shopping  Neuro/Psych numbness tingling  Prior Studies Any changes since last visit?  no  Physicians involved in your care Any changes since last visit?  no   Family History  Problem Relation Age of Onset  . Cancer Mother     multiple myeloma  . Diabetes Father   . Kidney disease Father   . Heart disease Father    Social History   Social History  . Marital status: Married   Spouse name: N/A  . Number of children: N/A  . Years of education: N/A   Social History Main Topics  . Smoking status: Never Smoker  . Smokeless tobacco: Never Used  . Alcohol use No  . Drug use: No  . Sexual activity: Not on file   Other Topics Concern  . Not on file   Social History Narrative  . No narrative on file   Past Surgical History:  Procedure Laterality Date  . CESAREAN SECTION     x2  . dental implant  July 2015  . ENDOSCOPIC PLANTAR FASCIOTOMY Right August 2015  . LITHOTRIPSY     multiple  . SHOULDER SURGERY  6/06  . SMALL INTESTINE SURGERY     cervical laminectomy  . utereroscopy  1995   Past Medical History:  Diagnosis Date  . Chronic kidney disease    stones  . Depression   . Diabetes mellitus   . Hypertension   . Neuromuscular disorder ([place])    There were no vitals taken for this visit.  Opioid Risk Score:   Fall Risk Score:  `1  Depression screen PHQ 2/9  Depression screen [facility] 2/9 07/25/2015 01/01/2015 08/12/2014  Decreased Interest '1 1 1  '$ Down, Depressed, Hopeless '1 1 1  '$ PHQ - 2 Score '2 2 2  '$ Altered sleeping - - 1  Tired, decreased energy - - 3  Change in appetite - - 2  Feeling bad or failure about yourself  - - 3  Trouble concentrating - - 2  Moving slowly or fidgety/restless - - 1  Suicidal thoughts - - 0  PHQ-9 Score - - 14   Review of Systems  Constitutional: Negative.   HENT: Negative.   Eyes: Negative.   Respiratory: Negative.   Cardiovascular: Negative.   Gastrointestinal: Negative.   Endocrine: Negative.   Genitourinary: Negative.   Musculoskeletal: Negative.   Skin: Negative.   Allergic/Immunologic: Negative.   Neurological: Positive for numbness.       Tingling  Hematological: Negative.   Psychiatric/Behavioral: Negative.        Objective:   Physical Exam  Constitutional: She is oriented to person, place, and time. She appears well-developed and well-nourished.  HENT:  Head: Normocephalic and atraumatic.    Neck: Normal range of motion. Neck supple.  Cardiovascular: Normal rate and regular rhythm.   Pulmonary/Chest: Effort normal and breath sounds normal.  Musculoskeletal:  Normal Muscle Bulk and Muscle Testing Reveals: Upper Extremities: Full ROM and Muscle Strength 5/5 Bilateral Buttock Tenderness Lower Extremities: Full ROM and Muscle Strength 5/5 Arises from Table Slowly Narrow Based Gait  Neurological: She is alert and oriented to person, place, and time.  Skin: Skin is warm and dry.  Psychiatric: She has a normal mood and affect.  Nursing note and vitals reviewed.         Assessment & Plan:  1.Complex regional pain syndrome right lower extremity postoperative. She's s/p post plantar fascial release. She continue withsensitivity totouch. She has diffuse numbness and tingling. Continue Gabapentin. Refilled: Nucynta 100 mg one tablet four times a day. #120. We will continue the opioid monitoring program, this consists of regular clinic visits, examinations, urine drug screen, pill counts as well as use of [place] Controlled Substance reporting System. 2.Myofascial pain syndrome: Continue with ice, heat and exercise regime.  3. Depression: Continue: Zoloft 4. Muscle Spasm: Continue Tizanidine  20 minutes of face to face patient care time was spent during this visit. All questions was encouraged and answered.  F/U in 1 month

## 2016-06-10 ENCOUNTER — Telehealth: Payer: Self-pay | Admitting: Registered Nurse

## 2016-06-10 NOTE — Telephone Encounter (Signed)
On January 18,2018 NCCSR was reviewed: No conflict was seen on the [place] with Multiple Prescribers. Ms. Fosco a signed Narcotic Contract with our office. If there were any discrepancies this would have beenreported to her Physcian.

## 2016-06-29 ENCOUNTER — Encounter: Payer: Self-pay | Admitting: Registered Nurse

## 2016-06-29 ENCOUNTER — Encounter: Payer: BLUE CROSS/BLUE SHIELD | Attending: Physical Medicine & Rehabilitation | Admitting: Registered Nurse

## 2016-06-29 VITALS — BP 116/83 | HR 108

## 2016-06-29 DIAGNOSIS — G609 Hereditary and idiopathic neuropathy, unspecified: Secondary | ICD-10-CM

## 2016-06-29 DIAGNOSIS — G894 Chronic pain syndrome: Secondary | ICD-10-CM

## 2016-06-29 DIAGNOSIS — M961 Postlaminectomy syndrome, not elsewhere classified: Secondary | ICD-10-CM | POA: Diagnosis not present

## 2016-06-29 DIAGNOSIS — Z79899 Other long term (current) drug therapy: Secondary | ICD-10-CM

## 2016-06-29 DIAGNOSIS — M5412 Radiculopathy, cervical region: Secondary | ICD-10-CM | POA: Diagnosis not present

## 2016-06-29 DIAGNOSIS — M797 Fibromyalgia: Secondary | ICD-10-CM | POA: Diagnosis not present

## 2016-06-29 DIAGNOSIS — Z5181 Encounter for therapeutic drug level monitoring: Secondary | ICD-10-CM | POA: Insufficient documentation

## 2016-06-29 DIAGNOSIS — M791 Myalgia: Secondary | ICD-10-CM

## 2016-06-29 DIAGNOSIS — G90521 Complex regional pain syndrome I of right lower limb: Secondary | ICD-10-CM | POA: Insufficient documentation

## 2016-06-29 DIAGNOSIS — M7918 Myalgia, other site: Secondary | ICD-10-CM

## 2016-06-29 MED ORDER — TAPENTADOL HCL 100 MG PO TABS: 1.0000 | ORAL_TABLET | Freq: Four times a day (QID) | ORAL | 0 refills | Status: DC | PRN

## 2016-06-29 MED ORDER — GABAPENTIN 600 MG PO TABS: ORAL_TABLET | ORAL | 3 refills | Status: DC

## 2016-06-29 NOTE — Progress Notes (Signed)
   Subjective:    Patient ID: Sabrina Williams, female    DOB: 09-01-1970, 45 y.o.   MRN: [id number]  HPI    Past Medical History:  Diagnosis Date  . Chronic kidney disease    stones  . Depression   . Diabetes mellitus   . Hypertension   . Neuromuscular disorder (HCC)    BP 116/83 (BP Location: Right Arm, Patient Position: Sitting, Cuff Size: Normal)   Pulse (!) 108 Comment: apical  SpO2 94%   Opioid Risk Score:   Fall Risk Score:  `1  Depression screen PHQ 2/9  Depression screen [facility] 2/9 07/25/2015 01/01/2015 08/12/2014  Decreased Interest 1 1 1   Down, Depressed, Hopeless 1 1 1   PHQ - 2 Score 2 2 2   Altered sleeping - - 1  Tired, decreased energy - - 3  Change in appetite - - 2  Feeling bad or failure about yourself  - - 3  Trouble concentrating - - 2  Moving slowly or fidgety/restless - - 1  Suicidal thoughts - - 0  PHQ-9 Score - - 14     Review of Systems     Objective:   Physical Exam        Assessment & Plan:

## 2016-06-29 NOTE — Progress Notes (Signed)
Subjective:    Patient ID: Sabrina Williams, female    DOB: [birth year], 46 y.o.   MRN: [id number]  HPI: [name] is a 46 year old female who returns for follow up appointment for chronic pain and medication refill. She states her pain is located in her left shoulder, left arm, left hand,  lower- back and right knee. She rates her pain 5. Her current exercise regime is walking short distances,performing stretching exercises and light house work.  S/P Lap Band Surgery on August 10th,2017, she has lost 109 lbs she states.   Pain Inventory Average Pain 4 Pain Right Now 5 My pain is constant, sharp, burning, dull, stabbing and aching  In the last 24 hours, has pain interfered with the following? General activity 5 Relation with others 3 Enjoyment of life 4 What TIME of day is your pain at its worst? evening Sleep (in general) Fair  Pain is worse with: walking, standing and some activites Pain improves with: rest, heat/ice and medication Relief from Meds: 7  Mobility walk without assistance how many minutes can you walk? 30 ability to climb steps?  yes do you drive?  yes Do you have any goals in this area?  yes  Function not employed: date last employed . I need assistance with the following:  meal prep, household duties and shopping Do you have any goals in this area?  no  Neuro/Psych No problems in this area  Prior Studies Any changes since last visit?  no  Physicians involved in your care Any changes since last visit?  no   Family History  Problem Relation Age of Onset  . Cancer Mother     multiple myeloma  . Diabetes Father   . Kidney disease Father   . Heart disease Father    Social History   Social History  . Marital status: Married    Spouse name: N/A  . Number of children: N/A  . Years of education: N/A   Social History Main Topics  . Smoking status: Never Smoker  . Smokeless tobacco: Never Used  . Alcohol use No  . Drug use: No    . Sexual activity: Not Asked   Other Topics Concern  . None   Social History Narrative  . None   Past Surgical History:  Procedure Laterality Date  . CESAREAN SECTION     x2  . dental implant  July 2015  . ENDOSCOPIC PLANTAR FASCIOTOMY Right August 2015  . LITHOTRIPSY     multiple  . SHOULDER SURGERY  6/06  . SMALL INTESTINE SURGERY     cervical laminectomy  . utereroscopy  1995   Past Medical History:  Diagnosis Date  . Chronic kidney disease    stones  . Depression   . Diabetes mellitus   . Hypertension   . Neuromuscular disorder (HCC)    BP 116/83 (BP Location: Right Arm, Patient Position: Sitting, Cuff Size: Normal)   Pulse (!) 108 Comment: apical  SpO2 94%   Opioid Risk Score:   Fall Risk Score:  `1  Depression screen PHQ 2/9  Depression screen [facility] 2/9 07/25/2015 01/01/2015 08/12/2014  Decreased Interest '1 1 1  '$ Down, Depressed, Hopeless '1 1 1  '$ PHQ - 2 Score '2 2 2  '$ Altered sleeping - - 1  Tired, decreased energy - - 3  Change in appetite - - 2  Feeling bad or failure about yourself  - - 3  Trouble concentrating - - 2  Moving slowly or fidgety/restless - - 1  Suicidal thoughts - - 0  PHQ-9 Score - - 14     Review of Systems  Constitutional: Negative.   HENT: Negative.   Eyes: Negative.   Respiratory: Negative.   Cardiovascular: Negative.   Gastrointestinal: Negative.   Genitourinary: Negative.   Musculoskeletal: Positive for arthralgias, myalgias, neck pain and neck stiffness.  Allergic/Immunologic: Negative.   Neurological: Negative.   Hematological: Negative.   Psychiatric/Behavioral: Negative.   All other systems reviewed and are negative.      Objective:   Physical Exam  Constitutional: She is oriented to person, place, and time. She appears well-developed and well-nourished.  HENT:  Head: Normocephalic and atraumatic.  Neck: Normal range of motion. Neck supple.  Cardiovascular: Normal rate and regular rhythm.   Pulmonary/Chest: Effort  normal and breath sounds normal.  Musculoskeletal:  Normal Muscle Bulk and Muscle Testing Reveals: Upper Extremities: Full ROM and Muscle Strength 5/5 Bilateral AC Joint Tenderness R>L Thoracic Paraspinal Tenderness: T-1-T-3 T-6-T-8 Lumbar Paraspinal Tenderness: L-3-L-5 Lower Extremities: Full ROM and Muscle Strength 5/5 Arises from chair with ease Narrow Based gait   Neurological: She is alert and oriented to person, place, and time.  Skin: Skin is warm and dry.  Psychiatric: She has a normal mood and affect.  Vitals reviewed.         Assessment & Plan:  1.Complex regional pain syndrome right lower extremity postoperative. She's s/p post plantar fascial release. She continue withsensitivity totouch. She has diffuse numbness and tingling. Continue Gabapentin. 06/29/2016 Refilled: Nucynta 100 mg one tablet four times a day. #120. We will continue the opioid monitoring program, this consists of regular clinic visits, examinations, urine drug screen, pill counts as well as use of [place] Controlled Substance reporting System. 2.Myofascial pain syndrome: Continue with ice, heat and exercise regime. 06/29/2016 3. Depression: Continue: Zoloft. 06/29/2016 4. Muscle Spasm: Continue Tizanidine: 06/29/2016 5. Cervical Post Laminectomy: Continue to Monitor. 06/29/2016 6. Peripheral Neuropathy: Continue Gabapentin: 06/29/2016   F/U in 1 month

## 2016-07-27 ENCOUNTER — Encounter: Payer: Self-pay | Admitting: Registered Nurse

## 2016-07-27 ENCOUNTER — Encounter: Payer: BLUE CROSS/BLUE SHIELD | Attending: Physical Medicine & Rehabilitation | Admitting: Registered Nurse

## 2016-07-27 VITALS — BP 126/87 | HR 80 | Resp 14

## 2016-07-27 DIAGNOSIS — M791 Myalgia: Secondary | ICD-10-CM

## 2016-07-27 DIAGNOSIS — G609 Hereditary and idiopathic neuropathy, unspecified: Secondary | ICD-10-CM | POA: Diagnosis not present

## 2016-07-27 DIAGNOSIS — Z79899 Other long term (current) drug therapy: Secondary | ICD-10-CM

## 2016-07-27 DIAGNOSIS — Z5181 Encounter for therapeutic drug level monitoring: Secondary | ICD-10-CM | POA: Diagnosis not present

## 2016-07-27 DIAGNOSIS — M7918 Myalgia, other site: Secondary | ICD-10-CM

## 2016-07-27 DIAGNOSIS — G90521 Complex regional pain syndrome I of right lower limb: Secondary | ICD-10-CM

## 2016-07-27 DIAGNOSIS — M961 Postlaminectomy syndrome, not elsewhere classified: Secondary | ICD-10-CM

## 2016-07-27 DIAGNOSIS — M5412 Radiculopathy, cervical region: Secondary | ICD-10-CM | POA: Diagnosis not present

## 2016-07-27 DIAGNOSIS — G894 Chronic pain syndrome: Secondary | ICD-10-CM

## 2016-07-27 DIAGNOSIS — M79602 Pain in left arm: Secondary | ICD-10-CM | POA: Diagnosis not present

## 2016-07-27 DIAGNOSIS — M797 Fibromyalgia: Secondary | ICD-10-CM | POA: Insufficient documentation

## 2016-07-27 MED ORDER — TIZANIDINE HCL 4 MG PO TABS: 4.0000 mg | ORAL_TABLET | Freq: Every day | ORAL | 5 refills | Status: DC | PRN

## 2016-07-27 MED ORDER — TAPENTADOL HCL 100 MG PO TABS: 1.0000 | ORAL_TABLET | Freq: Four times a day (QID) | ORAL | 0 refills | Status: DC | PRN

## 2016-07-27 NOTE — Progress Notes (Signed)
Subjective:    Patient ID: Sabrina Williams, female    DOB: July 18, 1970, 46 y.o.   MRN: [id number]  HPI: Sabrina Williams is a 46 year old female who returns for follow up appointment for chronic pain and medication refill. She states her pain is located in her neck radiating into her left shoulder, left arm, left hand, also has bilateral  Knee pain R>L. She rates her pain 3.  Her current exercise regime is walking 3,000 steps daily.performing stretching exercises and light house work.  S/P Lap Band Surgery on August 10th,2017, she has lost 115 lbs she states.    Pain Inventory Average Pain 4 Pain Right Now 3 My pain is constant, sharp, burning, dull, stabbing, tingling and aching  In the last 24 hours, has pain interfered with the following? General activity 5 Relation with others 3 Enjoyment of life 3 What TIME of day is your pain at its worst? evening Sleep (in general) Good  Pain is worse with: walking, bending, standing and some activites Pain improves with: rest, heat/ice and medication Relief from Meds: 7  Mobility walk without assistance how many minutes can you walk? 20-30 ability to climb steps?  yes do you drive?  yes Do you have any goals in this area?  yes  Function not employed: date last employed . I need assistance with the following:  meal prep, household duties and shopping Do you have any goals in this area?  yes  Neuro/Psych No problems in this area  Prior Studies Any changes since last visit?  no  Physicians involved in your care Any changes since last visit?  no   Family History  Problem Relation Age of Onset  . Cancer Mother     multiple myeloma  . Diabetes Father   . Kidney disease Father   . Heart disease Father    Social History   Social History  . Marital status: Married    Spouse name: N/A  . Number of children: N/A  . Years of education: N/A   Social History Main Topics  . Smoking status: Never Smoker  . Smokeless  tobacco: Never Used  . Alcohol use No  . Drug use: No  . Sexual activity: Not Asked   Other Topics Concern  . None   Social History Narrative  . None   Past Surgical History:  Procedure Laterality Date  . CESAREAN SECTION     x2  . dental implant  July 2015  . ENDOSCOPIC PLANTAR FASCIOTOMY Right August 2015  . LITHOTRIPSY     multiple  . SHOULDER SURGERY  6/06  . SMALL INTESTINE SURGERY     cervical laminectomy  . utereroscopy  1995   Past Medical History:  Diagnosis Date  . Chronic kidney disease    stones  . Depression   . Diabetes mellitus   . Hypertension   . Neuromuscular disorder (HCC)    BP 126/87 (BP Location: Right Arm, Patient Position: Sitting, Cuff Size: Large)   Pulse 80   Resp 14   SpO2 98%   Opioid Risk Score:   Fall Risk Score:  `1  Depression screen PHQ 2/9  Depression screen [facility] 2/9 07/25/2015 01/01/2015 08/12/2014  Decreased Interest '1 1 1  '$ Down, Depressed, Hopeless '1 1 1  '$ PHQ - 2 Score '2 2 2  '$ Altered sleeping - - 1  Tired, decreased energy - - 3  Change in appetite - - 2  Feeling bad or failure about  yourself  - - 3  Trouble concentrating - - 2  Moving slowly or fidgety/restless - - 1  Suicidal thoughts - - 0  PHQ-9 Score - - 14    Review of Systems  Constitutional: Negative.   HENT: Negative.   Eyes: Negative.   Respiratory: Negative.   Cardiovascular: Negative.   Gastrointestinal: Negative.   Endocrine: Negative.   Genitourinary: Negative.   Musculoskeletal: Positive for arthralgias, back pain, myalgias and neck pain.  Skin: Negative.   Allergic/Immunologic: Negative.   Neurological: Negative.   Hematological: Negative.   Psychiatric/Behavioral: Negative.   All other systems reviewed and are negative.      Objective:   Physical Exam  Constitutional: She is oriented to person, place, and time. She appears well-developed and well-nourished.  HENT:  Head: Normocephalic and atraumatic.  Neck: Normal range of motion. Neck  supple.  Cardiovascular: Normal rate and regular rhythm.   Pulmonary/Chest: Effort normal and breath sounds normal.  Musculoskeletal:  Normal Muscle Bulk and Muscle Testing Reveals: Upper Extremities: Full ROM and Muscle Strength 5/5 Lower Extremities: Full ROM and Muscle Strength 5/5 Arises from Table with ease Narrow Based Gait  Neurological: She is alert and oriented to person, place, and time.  Skin: Skin is warm and dry.  Psychiatric: She has a normal mood and affect.  Nursing note and vitals reviewed.         Assessment & Plan:  1.Complex regional pain syndrome right lower extremity postoperative. She's s/p post plantar fascial release. She continue withsensitivity totouch. She has diffuse numbness and tingling. Continue Gabapentin. 07/27/2016 Refilled: Nucynta 100 mg one tablet four times a day. #120. We will continue the opioid monitoring program, this consists of regular clinic visits, examinations, urine drug screen, pill counts as well as use of [place] Controlled Substance reporting System. 2.Myofascial pain syndrome: Continue with ice, heat and exercise regime. 07/27/2016 3. Depression: Continue: Zoloft. 07/27/2016 4. Muscle Spasm: Continue Tizanidine: 07/27/2016 5. Cervical Post Laminectomy: Continue to Monitor. 07/27/2016 7. Cervical  Radiculopathy/ Left Arm Pain : Continue Gabapentin. 07/27/2016 6. Peripheral Neuropathy: Continue Gabapentin: 07/27/2016   20 minutes of face to face patient care time was spent during this visit. All questions were encouraged and answered.  F/U in 1 month

## 2016-07-31 LAB — TOXASSURE SELECT,+ANTIDEPR,UR

## 2016-08-24 ENCOUNTER — Encounter: Payer: BLUE CROSS/BLUE SHIELD | Admitting: Registered Nurse

## 2016-08-30 ENCOUNTER — Telehealth: Payer: Self-pay | Admitting: Registered Nurse

## 2016-08-30 ENCOUNTER — Encounter: Payer: BLUE CROSS/BLUE SHIELD | Attending: Physical Medicine & Rehabilitation | Admitting: Registered Nurse

## 2016-08-30 ENCOUNTER — Encounter: Payer: Self-pay | Admitting: Registered Nurse

## 2016-08-30 VITALS — BP 126/86 | HR 82 | Resp 14

## 2016-08-30 DIAGNOSIS — M5412 Radiculopathy, cervical region: Secondary | ICD-10-CM | POA: Diagnosis not present

## 2016-08-30 DIAGNOSIS — M797 Fibromyalgia: Secondary | ICD-10-CM | POA: Diagnosis not present

## 2016-08-30 DIAGNOSIS — G894 Chronic pain syndrome: Secondary | ICD-10-CM

## 2016-08-30 DIAGNOSIS — M961 Postlaminectomy syndrome, not elsewhere classified: Secondary | ICD-10-CM | POA: Diagnosis not present

## 2016-08-30 DIAGNOSIS — M791 Myalgia: Secondary | ICD-10-CM

## 2016-08-30 DIAGNOSIS — Z79899 Other long term (current) drug therapy: Secondary | ICD-10-CM

## 2016-08-30 DIAGNOSIS — G90521 Complex regional pain syndrome I of right lower limb: Secondary | ICD-10-CM | POA: Diagnosis not present

## 2016-08-30 DIAGNOSIS — Z5181 Encounter for therapeutic drug level monitoring: Secondary | ICD-10-CM | POA: Insufficient documentation

## 2016-08-30 DIAGNOSIS — G609 Hereditary and idiopathic neuropathy, unspecified: Secondary | ICD-10-CM

## 2016-08-30 DIAGNOSIS — M7918 Myalgia, other site: Secondary | ICD-10-CM

## 2016-08-30 MED ORDER — TAPENTADOL HCL 100 MG PO TABS: 1.0000 | ORAL_TABLET | Freq: Four times a day (QID) | ORAL | 0 refills | Status: DC | PRN

## 2016-08-30 NOTE — Telephone Encounter (Signed)
  On 08/30/2016 the  [place] was reviewed no conflict was seen on the [place] with multiple prescribers. Sabrina Williams has a signed narcotic contract with our office. If there were any discrepancies this would have been reported to her physician.

## 2016-08-30 NOTE — Progress Notes (Signed)
 Subjective:    Patient ID: Sabrina Williams, female    DOB: 12/10/1970, 46 y.o.   MRN: [id number]  HPI:  Sabrina Williams is a 46year old female who returns for follow up appointmentfor chronic pain and medication refill. She states her pain is located in her right shoulder, left arm with tingling and numbness and right foot pain. She rates her pain 2. Her current exercise regime is walking,performing stretching exercises and light house work.  Sabrina Williams last UDS was 07/27/2016 it was consistent.   S/P Lap Band Surgery on August 10th,2017, she has lost 120lbs she states.   Pain Inventory Average Pain 3 Pain Right Now 2 My pain is constant, sharp, burning, dull, tingling and aching  In the last 24 hours, has pain interfered with the following? General activity 3 Relation with others 1 Enjoyment of life 4 What TIME of day is your pain at its worst? evening Sleep (in general) NA  Pain is worse with: not answered Pain improves with: not answered Relief from Meds: not answered  Mobility walk without assistance how many minutes can you walk? 30 ability to climb steps?  yes do you drive?  yes  Function not employed: date last employed . I need assistance with the following:  meal prep, household duties and shopping  Neuro/Psych numbness depression anxiety  Prior Studies Any changes since last visit?  no  Physicians involved in your care Any changes since last visit?  no   Family History  Problem Relation Age of Onset  . Cancer Mother     multiple myeloma  . Diabetes Father   . Kidney disease Father   . Heart disease Father    Social History   Social History  . Marital status: Married    Spouse name: N/A  . Number of children: N/A  . Years of education: N/A   Social History Main Topics  . Smoking status: Never Smoker  . Smokeless tobacco: Never Used  . Alcohol use No  . Drug use: No  . Sexual activity: Not Asked   Other Topics Concern  .  None   Social History Narrative  . None   Past Surgical History:  Procedure Laterality Date  . CESAREAN SECTION     x2  . dental implant  July 2015  . ENDOSCOPIC PLANTAR FASCIOTOMY Right August 2015  . LITHOTRIPSY     multiple  . SHOULDER SURGERY  6/06  . SMALL INTESTINE SURGERY     cervical laminectomy  . utereroscopy  1995   Past Medical History:  Diagnosis Date  . Chronic kidney disease    stones  . Depression   . Diabetes mellitus   . Hypertension   . Neuromuscular disorder (HCC)    BP 126/86   Pulse 82   Resp 14   SpO2 97%   Opioid Risk Score:   Fall Risk Score:  `1  Depression screen PHQ 2/9  Depression screen PHQ 2/9 08/30/2016 07/25/2015 01/01/2015 08/12/2014  Decreased Interest 1 1 1 1  Down, Depressed, Hopeless 1 1 1 1  PHQ - 2 Score 2 2 2 2  Altered sleeping - - - 1  Tired, decreased energy - - - 3  Change in appetite - - - 2  Feeling bad or failure about yourself  - - - 3  Trouble concentrating - - - 2  Moving slowly or fidgety/restless - - - 1  Suicidal thoughts - - - 0  PHQ-9 Score - - -   14   Review of Systems  Constitutional: Negative.   HENT: Negative.   Eyes: Negative.   Respiratory: Negative.   Cardiovascular: Negative.   Gastrointestinal: Negative.   Endocrine: Negative.   Genitourinary: Negative.   Musculoskeletal: Negative.   Skin: Negative.   Allergic/Immunologic: Negative.   Neurological: Positive for numbness.  Hematological: Negative.   Psychiatric/Behavioral: Positive for dysphoric mood. The patient is nervous/anxious.   All other systems reviewed and are negative.      Objective:   Physical Exam  Constitutional: She is oriented to person, place, and time. She appears well-developed and well-nourished.  HENT:  Head: Normocephalic and atraumatic.  Neck: Normal range of motion. Neck supple.  Cardiovascular: Normal rate and regular rhythm.   Pulmonary/Chest: Effort normal and breath sounds normal.  Musculoskeletal:  Normal  Muscle Bulk and Muscle Testing Reveals: Upper Extremities: Full ROM and Muscle Strength 5/5 Thoracic Paraspinal Tenderness: T-3-T-5 Lower Extremities: Full ROM and Muscle Strength 5/5 Arises from Table with Ease Narrow Based Gait  Neurological: She is alert and oriented to person, place, and time.  Skin: Skin is warm and dry.  Psychiatric: She has a normal mood and affect.  Nursing note and vitals reviewed.         Assessment & Plan:  1.Complex regional pain syndrome right lower extremity postoperative. She's s/p post plantar fascial release. She continue withsensitivity totouch. She has diffuse numbness and tingling. Continue Gabapentin.08/30/2016 Refilled: Nucynta 100 mg one tablet four times a day. #120. We will continue the opioid monitoring program, this consists of regular clinic visits, examinations, urine drug screen, pill counts as well as use of [place] Controlled Substance reporting System. 2.Myofascial pain syndrome: Continue with ice, heat and exercise regime. 08/30/2016 3. Depression: Continue: Zoloft. 08/30/2016 4. Muscle Spasm: Continue Tizanidine: 04/00/2018 5. Cervical Post Laminectomy: Continue to Monitor. 08/30/2016 6. Cervical  Radiculopathy/ Left Arm Pain : Continue Gabapentin. 08/30/2016 7. Peripheral Neuropathy: Continue Gabapentin: 0409/2018  15 minutes of face to face patient care time was spent during this visit. All questions were encouraged and answered.   F/U in 1 month

## 2016-09-06 ENCOUNTER — Telehealth: Payer: Self-pay | Admitting: Registered Nurse

## 2016-09-06 NOTE — Telephone Encounter (Signed)
Sabrina Williams had a UDS performed on 07/27/2016, it was consistent.

## 2016-09-27 ENCOUNTER — Encounter: Payer: BLUE CROSS/BLUE SHIELD | Admitting: Registered Nurse

## 2016-09-30 ENCOUNTER — Encounter: Payer: BLUE CROSS/BLUE SHIELD | Attending: Physical Medicine & Rehabilitation | Admitting: Registered Nurse

## 2016-09-30 ENCOUNTER — Encounter: Payer: Self-pay | Admitting: Registered Nurse

## 2016-09-30 VITALS — BP 128/85 | HR 97

## 2016-09-30 DIAGNOSIS — G609 Hereditary and idiopathic neuropathy, unspecified: Secondary | ICD-10-CM

## 2016-09-30 DIAGNOSIS — Z5181 Encounter for therapeutic drug level monitoring: Secondary | ICD-10-CM

## 2016-09-30 DIAGNOSIS — M961 Postlaminectomy syndrome, not elsewhere classified: Secondary | ICD-10-CM | POA: Diagnosis not present

## 2016-09-30 DIAGNOSIS — M7918 Myalgia, other site: Secondary | ICD-10-CM

## 2016-09-30 DIAGNOSIS — G90521 Complex regional pain syndrome I of right lower limb: Secondary | ICD-10-CM | POA: Diagnosis not present

## 2016-09-30 DIAGNOSIS — M79671 Pain in right foot: Secondary | ICD-10-CM | POA: Diagnosis not present

## 2016-09-30 DIAGNOSIS — G894 Chronic pain syndrome: Secondary | ICD-10-CM | POA: Diagnosis not present

## 2016-09-30 DIAGNOSIS — M25512 Pain in left shoulder: Secondary | ICD-10-CM | POA: Diagnosis not present

## 2016-09-30 DIAGNOSIS — M25561 Pain in right knee: Secondary | ICD-10-CM

## 2016-09-30 DIAGNOSIS — M5412 Radiculopathy, cervical region: Secondary | ICD-10-CM | POA: Insufficient documentation

## 2016-09-30 DIAGNOSIS — M791 Myalgia: Secondary | ICD-10-CM | POA: Diagnosis not present

## 2016-09-30 DIAGNOSIS — M797 Fibromyalgia: Secondary | ICD-10-CM | POA: Insufficient documentation

## 2016-09-30 DIAGNOSIS — Z79899 Other long term (current) drug therapy: Secondary | ICD-10-CM

## 2016-09-30 MED ORDER — TAPENTADOL HCL 100 MG PO TABS: 1.0000 | ORAL_TABLET | Freq: Four times a day (QID) | ORAL | 0 refills | Status: DC | PRN

## 2016-09-30 NOTE — Progress Notes (Signed)
Subjective:    Patient ID: Sabrina Williams, female    DOB: 1970-12-09, 46 y.o.   MRN: [id number]  HPI: Sabrina Williams is a 46year old female who returns for follow up appointmentfor chronic pain and medication refill. She states her pain is located in her left shoulder, left arm with tingling and numbness into left hand, right knee and right foot pain. She rates her pain 2. Her current exercise regime is walking,performing stretching exercises and light house work.  Ms. Cuen last UDS was 07/27/2016 it was consistent.   S/P Lap Band Surgery on August 10th,2017.    Pain Inventory Average Pain 3 Pain Right Now 2 My pain is constant, sharp, burning, tingling and aching  In the last 24 hours, has pain interfered with the following? General activity 3 Relation with others 3 Enjoyment of life 2 What TIME of day is your pain at its worst? evening Sleep (in general) Fair  Pain is worse with: walking, standing and some activites Pain improves with: rest, heat/ice, medication and TENS Relief from Meds: 7  Mobility walk without assistance how many minutes can you walk? 30 ability to climb steps?  yes do you drive?  yes  Function Do you have any goals in this area?  no  Neuro/Psych numbness tingling  Prior Studies Any changes since last visit?  no  Physicians involved in your care Any changes since last visit?  no   Family History  Problem Relation Age of Onset  . Cancer Mother        multiple myeloma  . Diabetes Father   . Kidney disease Father   . Heart disease Father    Social History   Social History  . Marital status: Married    Spouse name: N/A  . Number of children: N/A  . Years of education: N/A   Social History Main Topics  . Smoking status: Never Smoker  . Smokeless tobacco: Never Used  . Alcohol use No  . Drug use: No  . Sexual activity: Not Asked   Other Topics Concern  . None   Social History Narrative  . None   Past  Surgical History:  Procedure Laterality Date  . CESAREAN SECTION     x2  . dental implant  July 2015  . ENDOSCOPIC PLANTAR FASCIOTOMY Right August 2015  . LITHOTRIPSY     multiple  . SHOULDER SURGERY  6/06  . SMALL INTESTINE SURGERY     cervical laminectomy  . utereroscopy  1995   Past Medical History:  Diagnosis Date  . Chronic kidney disease    stones  . Depression   . Diabetes mellitus   . Hypertension   . Neuromuscular disorder (HCC)    BP 128/85   Pulse 97   SpO2 95%   Opioid Risk Score:  1  Fall Risk Score:  `1  Depression screen PHQ 2/9  Depression screen [facility] 2/9 09/30/2016 08/30/2016 07/25/2015 01/01/2015 08/12/2014  Decreased Interest _0 Down, Depressed, Hopeless _1 PHQ - 2 Score _2 Altered sleeping - - - - 1  Tired, decreased energy - - - - 3  Change in appetite - - - - 2  Feeling bad or failure about yourself  - - - - 3  Trouble concentrating - - - - 2  Moving slowly or fidgety/restless - - - - 1  Suicidal thoughts - - - -  0  PHQ-9 Score - - - - 14    Review of Systems  Constitutional: Negative.   Eyes: Negative.   Respiratory: Negative.   Cardiovascular: Negative.   Gastrointestinal: Negative.   Endocrine: Negative.   Genitourinary: Negative.   Musculoskeletal: Negative.   Skin: Negative.   Allergic/Immunologic: Negative.   Neurological: Positive for numbness.  Hematological: Negative.   Psychiatric/Behavioral: Negative.   All other systems reviewed and are negative.      Objective:   Physical Exam  Constitutional: She is oriented to person, place, and time. She appears well-developed and well-nourished.  HENT:  Head: Normocephalic and atraumatic.  Neck: Normal range of motion. Neck supple.  Cardiovascular: Normal rate and regular rhythm.   Pulmonary/Chest: Effort normal and breath sounds normal.  Musculoskeletal:  Normal Muscle Bulk and Muscle Testing Reveals: Upper Extremities: Full ROM and Muscle Strength  5/5 Lower Extremities: Full ROM and Muscle Strength 5/5 Arises from Table with ease Narrow Based Gait  Neurological: She is alert and oriented to person, place, and time.  Skin: Skin is warm and dry.  Psychiatric: She has a normal mood and affect.  Nursing note and vitals reviewed.         Assessment & Plan:  1.Complex regional pain syndrome right lower extremity postoperative. She's s/p post plantar fascial release. She continue withsensitivity totouch. She has diffuse numbness and tingling. Continue Gabapentin.09/30/2016 Refilled: Nucynta 100 mg one tablet four times a day. #120. We will continue the opioid monitoring program, this consists of regular clinic visits, examinations, urine drug screen, pill counts as well as use of [place] Controlled Substance reporting System. 2.Myofascial pain syndrome: Continue with ice, heat and exercise regime. 09/30/2016 3. Depression: Continue: Zoloft. 09/30/2016 4. Muscle Spasm: Continue Tizanidine: 09/30/2016 5. Cervical Post Laminectomy: Continue to Monitor. 09/30/2016 6. Cervical Radiculopathy/ Left Arm Pain : Continue Gabapentin. 09/30/2016 7. Peripheral Neuropathy: Continue Gabapentin: 09/30/2016  20  minutes of face to face patient care time was spent during this visit. All questions were encouraged and answered.  F/U in 1 month

## 2016-10-26 ENCOUNTER — Ambulatory Visit: Payer: BLUE CROSS/BLUE SHIELD | Admitting: Physical Medicine & Rehabilitation

## 2016-10-29 ENCOUNTER — Ambulatory Visit (HOSPITAL_BASED_OUTPATIENT_CLINIC_OR_DEPARTMENT_OTHER): Payer: BLUE CROSS/BLUE SHIELD | Admitting: Physical Medicine & Rehabilitation

## 2016-10-29 ENCOUNTER — Encounter: Payer: BLUE CROSS/BLUE SHIELD | Attending: Physical Medicine & Rehabilitation

## 2016-10-29 ENCOUNTER — Encounter: Payer: Self-pay | Admitting: Physical Medicine & Rehabilitation

## 2016-10-29 VITALS — BP 112/78 | HR 74

## 2016-10-29 DIAGNOSIS — M5412 Radiculopathy, cervical region: Secondary | ICD-10-CM | POA: Diagnosis not present

## 2016-10-29 DIAGNOSIS — M961 Postlaminectomy syndrome, not elsewhere classified: Secondary | ICD-10-CM | POA: Insufficient documentation

## 2016-10-29 DIAGNOSIS — G90521 Complex regional pain syndrome I of right lower limb: Secondary | ICD-10-CM | POA: Diagnosis not present

## 2016-10-29 DIAGNOSIS — M797 Fibromyalgia: Secondary | ICD-10-CM | POA: Diagnosis not present

## 2016-10-29 DIAGNOSIS — Z5181 Encounter for therapeutic drug level monitoring: Secondary | ICD-10-CM | POA: Insufficient documentation

## 2016-10-29 MED ORDER — GABAPENTIN 600 MG PO TABS: ORAL_TABLET | ORAL | 3 refills | Status: DC

## 2016-10-29 MED ORDER — TAPENTADOL HCL 75 MG PO TABS: 75.0000 mg | ORAL_TABLET | Freq: Four times a day (QID) | ORAL | 0 refills | Status: DC | PRN

## 2016-10-29 NOTE — Patient Instructions (Signed)
Reducing Nucynta to 75mg  this month and to 50mg  next month if tolerated

## 2016-10-29 NOTE — Progress Notes (Signed)
Subjective:    Patient ID: Sabrina Williams, female    DOB: Sep 12, 1970, 46 y.o.   MRN: [id number]  HPI Gastric sleeve with bypass August 10th, 2017, ~110lb weight loss Blood pressure fluctuating  Much easier moving around  No diabetes! Kidney function improved GFR 29-->~55 Plantar fasciitis and knee pain better but still present  On Gabapentin for tingling pain in LUE Tapentadol 169m QID can tell when she misses a dose Tizanidine 445m5 times a day helping with left sided shoulder pain Pain Inventory Average Pain 3 Pain Right Now 2 My pain is constant, sharp, burning, dull, stabbing, tingling and aching  In the last 24 hours, has pain interfered with the following? General activity 3 Relation with others 2 Enjoyment of life 2 What TIME of day is your pain at its worst? evening Sleep (in general) Fair  Pain is worse with: walking, standing and some activites Pain improves with: rest, heat/ice, therapy/exercise, pacing activities and TENS Relief from Meds: 7  Mobility walk without assistance ability to climb steps?  yes do you drive?  yes  Function not employed: date last employed . I need assistance with the following:  meal prep, household duties and shopping  Neuro/Psych No problems in this area  Prior Studies Any changes since last visit?  no  Physicians involved in your care Any changes since last visit?  no   Family History  Problem Relation Age of Onset  . Cancer Mother        multiple myeloma  . Diabetes Father   . Kidney disease Father   . Heart disease Father    Social History   Social History  . Marital status: Married    Spouse name: N/A  . Number of children: N/A  . Years of education: N/A   Social History Main Topics  . Smoking status: Never Smoker  . Smokeless tobacco: Never Used  . Alcohol use No  . Drug use: No  . Sexual activity: Not on file   Other Topics Concern  . Not on file   Social History Narrative  . No narrative  on file   Past Surgical History:  Procedure Laterality Date  . CESAREAN SECTION     x2  . dental implant  July 2015  . ENDOSCOPIC PLANTAR FASCIOTOMY Right August 2015  . LITHOTRIPSY     multiple  . SHOULDER SURGERY  6/06  . SMALL INTESTINE SURGERY     cervical laminectomy  . utereroscopy  1995   Past Medical History:  Diagnosis Date  . Chronic kidney disease    stones  . Depression   . Diabetes mellitus   . Hypertension   . Neuromuscular disorder [place]   There were no vitals taken for this visit.  Opioid Risk Score:   Fall Risk Score:  `1  Depression screen PH[facility] 2/9  [facility] 09/30/2016 08/30/2016 07/25/2015 01/01/2015 08/12/2014  Decreased Interest _0 Down, Depressed, Hopeless _1 PHQ - 2 Score _2 Altered sleeping - - - - 1  Tired, decreased energy - - - - 3  Change in appetite - - - - 2  Feeling bad or failure about yourself  - - - - 3  Trouble concentrating - - - - 2  Moving slowly or fidgety/restless - - - - 1  Suicidal thoughts - - - - 0  PHQ-9 Score - - - -  14    Review of Systems  Constitutional: Negative.   HENT: Negative.   Eyes: Negative.   Respiratory: Negative.   Cardiovascular: Negative.   Gastrointestinal: Negative.   Endocrine: Negative.   Genitourinary: Negative.   Musculoskeletal: Negative.   Skin: Negative.   Allergic/Immunologic: Negative.   Neurological: Negative.   Hematological: Negative.   Psychiatric/Behavioral: Negative.   All other systems reviewed and are negative.      Objective:   Physical Exam  Constitutional: She is oriented to person, place, and time. She appears well-developed and well-nourished.  HENT:  Head: Normocephalic and atraumatic.  Eyes: Conjunctivae and EOM are normal. Pupils are equal, round, and reactive to light.  Neurological: She is alert and oriented to person, place, and time.  Skin: Skin is warm and dry.  Psychiatric: She has a normal mood and affect.  Nursing  note and vitals reviewed.  Decreased sensation in the left C5 and left C6 dermatomal distribution to pinprick. Motor strength is 4/5 in the left shoulder. 5/5 bilateral biceps, triceps, grip, 5/5 bilateral hip flexion, extension, ankle dorsi flexion. Negative straight leg raising Gen. no acute distress. Mood and affect are appropriate       Assessment & Plan:  1. 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 She has radicular pain management by gabapentin 600 mg 4 times a day  2. Reflex sympathetic dystrophy associated with Achilles tendinitis and plantar fasciitis, she is had improvement in right lower extremity pain with her weight loss. Complains that Nucynta is expensive. Also would like to try to get back on a medication that does not require monthly written prescriptions. We will reduce Nucynta to 75 mg 4 times a day this month, then we can reduce again to 50 mg 4 times a day next month and if she is doing well, then can convert to tramadol 50 mg 4 times a day the following month  We discussed conversion of her Nucynta to tramadol and how that would be done Over half of the 25 min visit was spent counseling and coordinating care.

## 2016-11-03 ENCOUNTER — Ambulatory Visit
Admission: EM | Admit: 2016-11-03 | Discharge: 2016-11-03 | Disposition: A | Discharge status: Home / Self Care | Payer: BLUE CROSS/BLUE SHIELD | Attending: Family Medicine | Admitting: Family Medicine

## 2016-11-03 DIAGNOSIS — B370 Candidal stomatitis: Secondary | ICD-10-CM | POA: Diagnosis not present

## 2016-11-03 LAB — RAPID STREP SCREEN (MED CTR MEBANE ONLY): Streptococcus, Group A Screen (Direct): NEGATIVE

## 2016-11-03 MED ORDER — LIDOCAINE VISCOUS 2 % MT SOLN: OROMUCOSAL | 0 refills | Status: DC

## 2016-11-03 MED ORDER — NYSTATIN 100000 UNIT/ML MT SUSP: 500000.0000 [IU] | Freq: Four times a day (QID) | OROMUCOSAL | 0 refills | Status: DC

## 2016-11-03 NOTE — ED Triage Notes (Signed)
Patient complains of very sore throat that started on Sunday. Patient states that symptoms have been worsening. Patient states that she has pain radiating down her esophagus with with patches. Patient states that she tried throat spray without relief.

## 2016-11-03 NOTE — ED Provider Notes (Signed)
[facility]    CSN: [id number] Arrival date & time: 11/03/16  1107     History   Chief Complaint Chief Complaint  Patient presents with  . Sore Throat    HPI Sabrina Williams is a 46 y.o. female.   46 yo diabetic female with a c/o sore throat and white spots on the back of the throat. Denies any fevers, chills.     Sore Throat     Past Medical History:  Diagnosis Date  . Chronic kidney disease    stones  . Depression   . Diabetes mellitus   . Hypertension   . Neuromuscular disorder [facility])     Patient Active Problem List   Diagnosis Date Noted  . Right shoulder pain 12/05/2014  . Complex regional pain syndrome type 1 of right lower extremity 07/05/2014  . Cervical postlaminectomy syndrome 09/07/2011  . Cervical radiculopathy at C6 09/07/2011    Past Surgical History:  Procedure Laterality Date  . CESAREAN SECTION     x2  . dental implant  July 2015  . ENDOSCOPIC PLANTAR FASCIOTOMY Right August 2015  . LITHOTRIPSY     multiple  . SHOULDER SURGERY  6/06  . SMALL INTESTINE SURGERY     cervical laminectomy  . utereroscopy  1995    OB History    No data available       Home Medications    Prior to Admission medications   Medication Sig Start Date End Date Taking? Authorizing Provider  B Complex Vitamins (B COMPLEX 1 PO) Take 1 tablet by mouth daily.   Yes [provider]  benazepril (LOTENSIN) 20 MG tablet Take 20 mg by mouth daily.   Yes [provider]  calcium carbonate 1250 MG capsule Take 1,250 mg by mouth daily.   Yes [provider]  gabapentin (NEURONTIN) 600 MG tablet TAKE 1 TABLET (600 MG TOTAL) BY MOUTH 4 (FOUR) TIMES DAILY. 10/29/16  Yes Kirsteins, Luanna Salk, MD  lovastatin (MEVACOR) 20 MG tablet TAKE 1 TABLET (20 MG TOTAL) BY MOUTH DAILY WITH DINNER. 04/02/16  Yes [provider]  Multiple Vitamins-Minerals (BARIATRIC MULTIVITAMINS/IRON) CAPS Take 3 capsules by mouth daily before supper.    Yes [provider]  ranitidine (ZANTAC) 150 MG tablet Take 150 mg by mouth every morning.   Yes [provider]  sertraline (ZOLOFT) 100 MG tablet Take 1 tablet by mouth daily. 09/21/14  Yes [provider]  tapentadol HCl (NUCYNTA) 75 MG tablet Take 1 tablet (75 mg total) by mouth every 6 (six) hours as needed for moderate pain. 10/29/16  Yes Kirsteins, Luanna Salk, MD  tiZANidine (ZANAFLEX) 4 MG tablet Take 1 tablet (4 mg total) by mouth 5 (five) times daily as needed for muscle spasms. 07/27/16  Yes Bayard Hugger, NP  Vitamin D, Ergocalciferol, (DRISDOL) 50000 units CAPS capsule Take 50,000 Units by mouth once a week. 04/09/16  Yes [provider]  lidocaine (XYLOCAINE) 2 % solution 20 ml po gargle and spit q 6 hour prn 11/03/16   Norval Gable, MD  nystatin (MYCOSTATIN) 100000 UNIT/ML suspension Take 5 mLs (500,000 Units total) by mouth 4 (four) times daily. 11/03/16   Norval Gable, MD    Family History Family History  Problem Relation Age of Onset  . Cancer Mother        multiple myeloma  . Diabetes Father   . Kidney disease Father   . Heart disease Father     Social History Social  History  Substance Use Topics  . Smoking status: Never Smoker  . Smokeless tobacco: Never Used  . Alcohol use No     Allergies   Penicillins and Codeine sulfate   Review of Systems Review of Systems   Physical Exam Triage Vital Signs ED Triage Vitals  Enc Vitals Group     BP 11/03/16 1127 (!) 149/89     Pulse Rate 11/03/16 1127 87     Resp 11/03/16 1127 18     Temp 11/03/16 1127 98.2 F (36.8 C)     Temp Source 11/03/16 1127 Oral     SpO2 11/03/16 1127 98 %     Weight 11/03/16 1125 237 lb (107.5 kg)     Height 11/03/16 1125 _0  (1.727 m)     Head Circumference --      Peak Flow --      Pain Score 11/03/16 1127 5     Pain Loc --      Pain Edu? --      Excl. in [place]? --    No data found.   Updated Vital Signs BP (!) 149/89 (BP Location: Left  Arm)   Pulse 87   Temp 98.2 F (36.8 C) (Oral)   Resp 18   Ht _1  (1.727 m)   Wt 237 lb (107.5 kg)   SpO2 98%   BMI 36.04 kg/m   Visual Acuity Right Eye Distance:   Left Eye Distance:   Bilateral Distance:    Right Eye Near:   Left Eye Near:    Bilateral Near:     Physical Exam  Constitutional: She appears well-developed and well-nourished. No distress.  HENT:  White plaques on pharynx and back of oral mucosa  Skin: She is not diaphoretic.  Vitals reviewed.    UC Treatments / Results  Labs (all labs ordered are listed, but only abnormal results are displayed) Labs Reviewed  RAPID STREP SCREEN (NOT AT [facility])  CULTURE, GROUP A STREP [facility])    EKG  EKG Interpretation None       Radiology No results found.  Procedures Procedures (including critical care time)  Medications Ordered in UC Medications - No data to display   Initial Impression / Assessment and Plan / UC Course  I have reviewed the triage vital signs and the nursing notes.  Pertinent labs & imaging results that were available during my care of the patient were reviewed by me and considered in my medical decision making (see chart for details).       Final Clinical Impressions(s) / UC Diagnoses   Final diagnoses:  Thrush    New Prescriptions Discharge Medication List as of 11/03/2016 12:38 PM    START taking these medications   Details  lidocaine (XYLOCAINE) 2 % solution 20 ml po gargle and spit q 6 hour prn, Normal    nystatin (MYCOSTATIN) 100000 UNIT/ML suspension Take 5 mLs (500,000 Units total) by mouth 4 (four) times daily., Starting Wed 11/03/2016, Normal       1. Lab results (neg strep) and diagnosis reviewed with patient 2. rx as per orders above; reviewed possible side effects, interactions, risks and benefits  3. Recommend supportive treatment with otc tylenol prn 4. Follow-up prn if symptoms worsen or don't improve   Norval Gable, MD 11/03/16 1304

## 2016-11-06 LAB — CULTURE, GROUP A STREP (THRC)

## 2016-11-16 ENCOUNTER — Telehealth: Payer: Self-pay | Admitting: *Deleted

## 2016-11-16 DIAGNOSIS — M961 Postlaminectomy syndrome, not elsewhere classified: Secondary | ICD-10-CM

## 2016-11-16 DIAGNOSIS — G90521 Complex regional pain syndrome I of right lower limb: Secondary | ICD-10-CM

## 2016-11-16 MED ORDER — TAPENTADOL HCL 100 MG PO TABS: 1.0000 | ORAL_TABLET | Freq: Four times a day (QID) | ORAL | 0 refills | Status: DC | PRN

## 2016-11-16 NOTE — Telephone Encounter (Signed)
Okay to resume Nucynta 100 mg every 6 hours as needed

## 2016-11-16 NOTE — Telephone Encounter (Signed)
Rx printed and Sabrina Williams notified she can pick up.

## 2016-11-16 NOTE — Telephone Encounter (Signed)
Daisie said Sabrina Williams said to call office when she needed refill on her Nucynta.  She is saying that she tried to decrease per Dr Letta Pate but she is unable to tolerate and several facets of her pain have returned.  She is requesting to go back up to the 100mg  po q 6 hours.  Last Rx filled 10/30/16 and next appt is 12/03/16.  Please advise.

## 2016-12-03 ENCOUNTER — Encounter: Payer: Self-pay | Admitting: Registered Nurse

## 2016-12-03 ENCOUNTER — Telehealth: Payer: Self-pay | Admitting: Registered Nurse

## 2016-12-03 ENCOUNTER — Encounter: Payer: BLUE CROSS/BLUE SHIELD | Attending: Physical Medicine & Rehabilitation | Admitting: Registered Nurse

## 2016-12-03 VITALS — BP 146/90 | HR 71

## 2016-12-03 DIAGNOSIS — G894 Chronic pain syndrome: Secondary | ICD-10-CM | POA: Diagnosis not present

## 2016-12-03 DIAGNOSIS — M79671 Pain in right foot: Secondary | ICD-10-CM | POA: Diagnosis not present

## 2016-12-03 DIAGNOSIS — M797 Fibromyalgia: Secondary | ICD-10-CM | POA: Diagnosis not present

## 2016-12-03 DIAGNOSIS — Z5181 Encounter for therapeutic drug level monitoring: Secondary | ICD-10-CM | POA: Diagnosis not present

## 2016-12-03 DIAGNOSIS — Z79899 Other long term (current) drug therapy: Secondary | ICD-10-CM | POA: Diagnosis not present

## 2016-12-03 DIAGNOSIS — M25512 Pain in left shoulder: Secondary | ICD-10-CM

## 2016-12-03 DIAGNOSIS — M961 Postlaminectomy syndrome, not elsewhere classified: Secondary | ICD-10-CM | POA: Insufficient documentation

## 2016-12-03 DIAGNOSIS — G90521 Complex regional pain syndrome I of right lower limb: Secondary | ICD-10-CM | POA: Insufficient documentation

## 2016-12-03 DIAGNOSIS — M7918 Myalgia, other site: Secondary | ICD-10-CM

## 2016-12-03 DIAGNOSIS — M5412 Radiculopathy, cervical region: Secondary | ICD-10-CM | POA: Diagnosis not present

## 2016-12-03 DIAGNOSIS — M791 Myalgia: Secondary | ICD-10-CM

## 2016-12-03 MED ORDER — NYSTATIN 100000 UNIT/ML MT SUSP: 500000.0000 [IU] | Freq: Four times a day (QID) | OROMUCOSAL | 0 refills | Status: DC

## 2016-12-03 MED ORDER — TAPENTADOL HCL 100 MG PO TABS: 1.0000 | ORAL_TABLET | Freq: Four times a day (QID) | ORAL | 0 refills | Status: DC | PRN

## 2016-12-03 NOTE — Progress Notes (Signed)
Subjective:    Patient ID: Sabrina Williams, female    DOB: Apr 18, 1971, 46 y.o.   MRN: [id number]  HPI: Sabrina Williams is a 46year old female who returns for follow up appointmentfor chronic pain and medication refill. She states her pain is located in her left shoulder radiating into her left arm with tingling and numbness into left hand,  and right foot pain. She rates her pain 3. Her current exercise regime is walking,performing stretching exercises and light house work.  Sabrina Williams went to Urgent care on 11/03/2016, she was diagnosed with Thrush, she arrived today with c/o's of sore throat and white spots noted on her cheeks and tongue. Will prescribe Nystatin she will f/u with her PCP. She verbalizes understanding.  S/P Lap Band Surgery on August 10th,2017, she has lost 123lbs she states.    Sabrina Williams last UDS was 07/27/2016 it was consistent. Oral swab ordered today.   Pain Inventory Average Pain 3 Pain Right Now 3 My pain is constant, sharp, burning, dull, stabbing, tingling and aching  In the last 24 hours, has pain interfered with the following? General activity 3 Relation with others 2 Enjoyment of life 2 What TIME of day is your pain at its worst? evening Sleep (in general) Fair  Pain is worse with: walking, standing and some activites Pain improves with: rest, heat/ice, therapy/exercise and medication Relief from Meds: 7  Mobility walk without assistance how many minutes can you walk? 30-60 ability to climb steps?  yes do you drive?  yes  Function I need assistance with the following:  meal prep, household duties and shopping  Neuro/Psych No problems in this area  Prior Studies Any changes since last visit?  no  Physicians involved in your care Any changes since last visit?  no   Family History  Problem Relation Age of Onset  . Cancer Mother        multiple myeloma  . Diabetes Father   . Kidney disease Father   . Heart disease Father     Social History   Social History  . Marital status: Married    Spouse name: N/A  . Number of children: N/A  . Years of education: N/A   Social History Main Topics  . Smoking status: Never Smoker  . Smokeless tobacco: Never Used  . Alcohol use No  . Drug use: No  . Sexual activity: Not Asked   Other Topics Concern  . None   Social History Narrative  . None   Past Surgical History:  Procedure Laterality Date  . CESAREAN SECTION     x2  . dental implant  July 2015  . ENDOSCOPIC PLANTAR FASCIOTOMY Right August 2015  . LITHOTRIPSY     multiple  . SHOULDER SURGERY  6/06  . SMALL INTESTINE SURGERY     cervical laminectomy  . utereroscopy  1995   Past Medical History:  Diagnosis Date  . Chronic kidney disease    stones  . Depression   . Diabetes mellitus   . Hypertension   . Neuromuscular disorder (HCC)    BP (!) 146/90   Pulse 71   SpO2 97%   Opioid Risk Score:  1 Fall Risk Score:  `1  Depression screen PHQ 2/9  Depression screen [facility] 2/9 12/03/2016 09/30/2016 08/30/2016 07/25/2015 01/01/2015 08/12/2014  Decreased Interest 0 _0 Down, Depressed, Hopeless 0 _1 PHQ - 2 Score 0 2 2  _0 Altered sleeping - - - - - 1  Tired, decreased energy - - - - - 3  Change in appetite - - - - - 2  Feeling bad or failure about yourself  - - - - - 3  Trouble concentrating - - - - - 2  Moving slowly or fidgety/restless - - - - - 1  Suicidal thoughts - - - - - 0  PHQ-9 Score - - - - - 14    Review of Systems  Constitutional: Negative.   HENT: Negative.   Eyes: Negative.   Respiratory: Negative.   Cardiovascular: Negative.   Gastrointestinal: Negative.   Endocrine: Negative.   Genitourinary: Negative.   Musculoskeletal: Negative.   Skin: Negative.   Allergic/Immunologic: Negative.   Neurological: Negative.   Hematological: Negative.   Psychiatric/Behavioral: Negative.   All other systems reviewed and are negative.      Objective:   Physical Exam    Constitutional: She is oriented to person, place, and time. She appears well-developed and well-nourished.  HENT:  Head: Normocephalic and atraumatic.  Neck: Normal range of motion. Neck supple.  Cardiovascular: Normal rate and regular rhythm.   Pulmonary/Chest: Effort normal and breath sounds normal.  Musculoskeletal:  Normal Muscle Bulk and Muscle Testing Reveals: Upper Extremities: Full ROM and Muscle Strength 5/5 Lower Extremities: Full ROM and Muscle Strength 5/5 Arises from Table with ease Narrow Based Gait  Neurological: She is alert and oriented to person, place, and time.  Skin: Skin is warm and dry.  Psychiatric: She has a normal mood and affect.  Nursing note and vitals reviewed.         Assessment & Plan:  1.Complex regional pain syndrome right lower extremity postoperative. She's s/p post plantar fascial release. She continue withsensitivity totouch. She has diffuse numbness and tingling. Continue Gabapentin.12/03/2016 Refilled: Nucynta 100 mg one tablet four times a day. #120. We will continue the opioid monitoring program, this consists of regular clinic visits, examinations, urine drug screen, pill counts as well as use of [place] Controlled Substance reporting System. 2.Myofascial pain syndrome: Continue with ice, heat and exercise regime. 12/03/2016 3. Depression: Continue: Zoloft. 12/03/2016 4. Muscle Spasm: Continue Tizanidine: 12/03/2016 5. Cervical Post Laminectomy: Continue to Monitor. 12/03/2016 6. Cervical Radiculopathy/ Left Arm Pain : Continue Gabapentin. 12/03/2016 7. Peripheral Neuropathy: Continue Gabapentin: 12/03/2016  20 minutes of face to face patient care time was spent during this visit. All questions were encouraged and answered.  F/U in 1 month

## 2016-12-03 NOTE — Telephone Encounter (Signed)
On 12/03/2016 the [place] was reviewed no conflict was seen on the [place] with multiple prescribers. Sabrina Williams has a signed narcotic contract with our office. If there were any discrepancies this would have been reported to her physician.

## 2016-12-08 LAB — DRUG TOX MONITOR 1 W/CONF, ORAL FLD
Amphetamines: NEGATIVE ng/mL (ref <10)
Barbiturates: NEGATIVE ng/mL (ref <10)
Benzodiazepines: NEGATIVE ng/mL (ref <0.50)
Buprenorphine: NEGATIVE ng/mL (ref <0.025)
Cocaine: NEGATIVE ng/mL (ref <2.5)
Fentanyl: NEGATIVE ng/mL (ref <0.10)
Heroin Metabolite: NEGATIVE ng/mL (ref <1.0)
MDMA: NEGATIVE ng/mL (ref <10)
Marijuana: NEGATIVE ng/mL (ref <2.5)
Meperidine: NEGATIVE ng/mL (ref <5.0)
Meprobamate: NEGATIVE ng/mL (ref <2.5)
Methadone: NEGATIVE ng/mL (ref <5.0)
Nicotine Metabolite: NEGATIVE ng/mL (ref <5.0)
Opiates: NEGATIVE ng/mL (ref <2.5)
Phencyclidine: NEGATIVE ng/mL (ref <10)
Propoxyphene: NEGATIVE ng/mL (ref <5.0)
Tapentadol: 177.7 ng/mL — ABNORMAL HIGH (ref <5.0)
Tapentadol: POSITIVE ng/mL — AB (ref <5.0)
Tramadol: NEGATIVE ng/mL (ref <5.0)
Zolpidem: NEGATIVE ng/mL (ref <5.0)

## 2016-12-08 LAB — DRUG TOX ALC METAB W/CON, ORAL FLD: Alcohol Metabolite: NEGATIVE ng/mL (ref <25)

## 2016-12-23 ENCOUNTER — Telehealth: Payer: Self-pay | Admitting: *Deleted

## 2016-12-23 NOTE — Telephone Encounter (Signed)
Oral swab drug screen was consistent for prescribed medications.  ?

## 2016-12-27 ENCOUNTER — Other Ambulatory Visit: Payer: Self-pay | Admitting: Registered Nurse

## 2016-12-29 ENCOUNTER — Ambulatory Visit
Admission: EM | Admit: 2016-12-29 | Discharge: 2016-12-29 | Disposition: A | Discharge status: Home / Self Care | Payer: BLUE CROSS/BLUE SHIELD | Attending: Emergency Medicine | Admitting: Emergency Medicine

## 2016-12-29 ENCOUNTER — Ambulatory Visit: Admit: 2016-12-29 | Payer: BLUE CROSS/BLUE SHIELD

## 2016-12-29 ENCOUNTER — Ambulatory Visit (INDEPENDENT_AMBULATORY_CARE_PROVIDER_SITE_OTHER): Payer: BLUE CROSS/BLUE SHIELD

## 2016-12-29 DIAGNOSIS — S60221A Contusion of right hand, initial encounter: Secondary | ICD-10-CM | POA: Diagnosis not present

## 2016-12-29 DIAGNOSIS — M79641 Pain in right hand: Secondary | ICD-10-CM

## 2016-12-29 DIAGNOSIS — W19XXXA Unspecified fall, initial encounter: Secondary | ICD-10-CM

## 2016-12-29 DIAGNOSIS — S8011XA Contusion of right lower leg, initial encounter: Secondary | ICD-10-CM | POA: Diagnosis not present

## 2016-12-29 DIAGNOSIS — S8001XA Contusion of right knee, initial encounter: Secondary | ICD-10-CM

## 2016-12-29 DIAGNOSIS — M25561 Pain in right knee: Secondary | ICD-10-CM | POA: Diagnosis not present

## 2016-12-29 MED ORDER — MUPIROCIN 2 % EX OINT: 1.0000 "application " | TOPICAL_OINTMENT | Freq: Three times a day (TID) | CUTANEOUS | 0 refills | Status: DC

## 2016-12-29 NOTE — ED Provider Notes (Signed)
[facility]    CSN: [id number] Arrival date & time: 12/29/16  1344     History   Chief Complaint No chief complaint on file.   HPI Sabrina Williams is a 46 y.o. female.   HPI  This is a 46 year old female who had a trip and fall yesterday. She thinks she may have tripped on her  own feet causing her to fall forward on her outstretched right dominant hand and fell onto her knee. She also scraped her toe. She used ice last night but despite this has a discoloration over the lateral aspect of her right hand along with throbbing pain. In addition she has a new abrasion over the volar aspect of her palm lying the fifth MCP joint. She has another abrasion over her right anterior knee. She has numerous other lesions of her knee that she has a habit of scratching. None appeared to be infected at this point. She's been trying to elevate her hand above her heart as much as feasible.        Past Medical History:  Diagnosis Date  . Chronic kidney disease    stones  . Depression   . Diabetes mellitus   . Hypertension   . Neuromuscular disorder [facility])     Patient Active Problem List   Diagnosis Date Noted  . Right shoulder pain 12/05/2014  . Complex regional pain syndrome type 1 of right lower extremity 07/05/2014  . Cervical postlaminectomy syndrome 09/07/2011  . Cervical radiculopathy at C6 09/07/2011    Past Surgical History:  Procedure Laterality Date  . CESAREAN SECTION     x2  . dental implant  July 2015  . ENDOSCOPIC PLANTAR FASCIOTOMY Right August 2015  . LITHOTRIPSY     multiple  . SHOULDER SURGERY  6/06  . SMALL INTESTINE SURGERY     cervical laminectomy  . utereroscopy  1995    OB History    No data available       Home Medications    Prior to Admission medications   Medication Sig Start Date End Date Taking? Authorizing Provider  B Complex Vitamins (B COMPLEX 1 PO) Take 1 tablet by mouth daily.    [provider]  benazepril  (LOTENSIN) 20 MG tablet Take 20 mg by mouth daily.    [provider]  calcium carbonate 1250 MG capsule Take 1,250 mg by mouth daily.    [provider]  gabapentin (NEURONTIN) 600 MG tablet TAKE 1 TABLET (600 MG TOTAL) BY MOUTH 4 (FOUR) TIMES DAILY. 10/29/16   Kirsteins, Luanna Salk, MD  lidocaine (XYLOCAINE) 2 % solution 20 ml po gargle and spit q 6 hour prn 11/03/16   Norval Gable, MD  lovastatin (MEVACOR) 20 MG tablet TAKE 1 TABLET (20 MG TOTAL) BY MOUTH DAILY WITH DINNER. 04/02/16   [provider]  Multiple Vitamins-Minerals (BARIATRIC MULTIVITAMINS/IRON) CAPS Take 3 capsules by mouth daily before supper.    [provider]  mupirocin ointment (BACTROBAN) 2 % Apply 1 application topically 3 (three) times daily. 12/29/16   Lorin Picket, PA-C  nystatin (MYCOSTATIN) 100000 UNIT/ML suspension Take 5 mLs (500,000 Units total) by mouth 4 (four) times daily. 12/03/16   Bayard Hugger, NP  ranitidine (ZANTAC) 150 MG tablet Take 150 mg by mouth every morning.    [provider]  sertraline (ZOLOFT) 100 MG tablet Take 1 tablet by mouth daily. 09/21/14   [provider]  Tapentadol HCl (NUCYNTA) 100 MG TABS Take  1 tablet (100 mg total) by mouth every 6 (six) hours as needed. 12/03/16   Bayard Hugger, NP  tiZANidine (ZANAFLEX) 4 MG tablet TAKE 1 TABLET (4 MG TOTAL) BY MOUTH 5 (FIVE) TIMES DAILY AS NEEDED FOR MUSCLE SPASMS. 12/27/16   Bayard Hugger, NP  Vitamin D, Ergocalciferol, (DRISDOL) 50000 units CAPS capsule Take 50,000 Units by mouth once a week. 04/09/16   [provider]    Family History Family History  Problem Relation Age of Onset  . Cancer Mother        multiple myeloma  . Diabetes Father   . Kidney disease Father   . Heart disease Father     Social History Social History  Substance Use Topics  . Smoking status: Never Smoker  . Smokeless tobacco: Never Used  . Alcohol use No     Allergies   Penicillins and  Codeine sulfate   Review of Systems Review of Systems  Constitutional: Positive for activity change. Negative for appetite change, chills, fatigue and fever.  Musculoskeletal: Positive for joint swelling and myalgias.  Skin: Positive for color change and wound.  All other systems reviewed and are negative.    Physical Exam Triage Vital Signs ED Triage Vitals [12/29/16 1716]  Enc Vitals Group     BP      Pulse      Resp      Temp      Temp src      SpO2      Weight      Height      Head Circumference      Peak Flow      Pain Score 0     Pain Loc      Pain Edu?      Excl. in [place]?    No data found.   Updated Vital Signs There were no vitals taken for this visit.  Visual Acuity Right Eye Distance:   Left Eye Distance:   Bilateral Distance:    Right Eye Near:   Left Eye Near:    Bilateral Near:     Physical Exam  Constitutional: She is oriented to person, place, and time. She appears well-developed and well-nourished. No distress.  HENT:  Head: Normocephalic.  Eyes: Pupils are equal, round, and reactive to light.  Neck: Normal range of motion. Neck supple.  Musculoskeletal: Normal range of motion. She exhibits edema and tenderness.  Examination of the right dominant hand shows ecchymosis and swelling mostly ulnarly based over the fourth and fifth metacarpal phalangeal joint. He has range of motion that is uncomfortable but appears to be full. Tendons are strong to clinical testing. Strength is in tact as is sensation. Wrist range of motion is normal elbow range of motion and shoulder range of motion are also normal. There is no obvious deformity. There is no crepitus present. She has a abrasion over the palmar aspect of the fifth MCP area. It is not full-thickness.  Examination of the right knee shows abrasion over the proximal tibia. There is mild amount of swelling underneath. There is  tenderness to palpation. Is good range of motion of the knee. There is no  crepitus or deformity palpable of the patella.  Neurological: She is alert and oriented to person, place, and time.  Skin: Skin is warm and dry. She is not diaphoretic.  Psychiatric: She has a normal mood and affect. Her behavior is normal. Judgment and thought content normal.  Nursing note and vitals  reviewed.    UC Treatments / Results  Labs (all labs ordered are listed, but only abnormal results are displayed) Labs Reviewed - No data to display  EKG  EKG Interpretation None       Radiology Dg Knee Complete 4 Views Right  Result Date: 12/29/2016 CLINICAL DATA:  Fall  Right lateral hand pain and right knee pain EXAM: RIGHT KNEE - COMPLETE 4+ VIEW COMPARISON:  None. FINDINGS: There are degenerative changes in the right knee. Chondrocalcinosis and joint space narrowing are noted involving the medial, lateral, and patellofemoral compartments. There is a trace joint effusion. There is no acute fracture or acute subluxation. IMPRESSION: 1. Degenerative changes. 2. Trace effusion. Electronically Signed   By: Nolon Nations M.D.   On: 12/29/2016 17:26   Dg Hand Complete Right  Result Date: 12/29/2016 CLINICAL DATA:  46 year old female with right lateral hand pain EXAM: RIGHT HAND - COMPLETE 3+ VIEW COMPARISON:  None. FINDINGS: There is no evidence of fracture or dislocation. There is no evidence of arthropathy or other focal bone abnormality. Soft tissues are unremarkable. IMPRESSION: Negative. Electronically Signed   By: Jacqulynn Cadet M.D.   On: 12/29/2016 17:27    Procedures Procedures (including critical care time)  Medications Ordered in UC Medications - No data to display Patient was provided with a Velcro wrist splint  Initial Impression / Assessment and Plan / UC Course  I have reviewed the triage vital signs and the nursing notes.  Pertinent labs & imaging results that were available during my care of the patient were reviewed by me and considered in my medical decision  making (see chart for details).    Plan: 1. Test/x-ray results and diagnosis reviewed with patient 2. rx as per orders; risks, benefits, potential side effects reviewed with patient 3. Recommend supportive treatment with ice and elevation. Use a wrist splint for comfort. Follow-up with primary care next week if not improving. 4. F/u prn if symptoms worsen or don't improve     Final Clinical Impressions(s) / UC Diagnoses   Final diagnoses:  Contusion of right hand, initial encounter  Contusion, knee and lower leg, right, initial encounter    New Prescriptions Discharge Medication List as of 12/29/2016  5:17 PM    Bactroban ointment to abrasions 3 times a day   Controlled Substance Prescriptions [place] Controlled Substance Registry consulted? Not Applicable   Lorin Picket, PA-C 12/29/16 1746

## 2017-01-03 ENCOUNTER — Other Ambulatory Visit: Payer: Self-pay

## 2017-01-10 ENCOUNTER — Encounter: Payer: BLUE CROSS/BLUE SHIELD | Attending: Physical Medicine & Rehabilitation | Admitting: Registered Nurse

## 2017-01-10 ENCOUNTER — Encounter: Payer: Self-pay | Admitting: Registered Nurse

## 2017-01-10 VITALS — BP 122/84 | HR 69

## 2017-01-10 DIAGNOSIS — M797 Fibromyalgia: Secondary | ICD-10-CM | POA: Insufficient documentation

## 2017-01-10 DIAGNOSIS — M79641 Pain in right hand: Secondary | ICD-10-CM

## 2017-01-10 DIAGNOSIS — M791 Myalgia: Secondary | ICD-10-CM | POA: Diagnosis not present

## 2017-01-10 DIAGNOSIS — M5412 Radiculopathy, cervical region: Secondary | ICD-10-CM | POA: Insufficient documentation

## 2017-01-10 DIAGNOSIS — G894 Chronic pain syndrome: Secondary | ICD-10-CM

## 2017-01-10 DIAGNOSIS — M7918 Myalgia, other site: Secondary | ICD-10-CM

## 2017-01-10 DIAGNOSIS — G90521 Complex regional pain syndrome I of right lower limb: Secondary | ICD-10-CM | POA: Diagnosis not present

## 2017-01-10 DIAGNOSIS — Z5181 Encounter for therapeutic drug level monitoring: Secondary | ICD-10-CM | POA: Diagnosis not present

## 2017-01-10 DIAGNOSIS — M961 Postlaminectomy syndrome, not elsewhere classified: Secondary | ICD-10-CM

## 2017-01-10 DIAGNOSIS — Z79899 Other long term (current) drug therapy: Secondary | ICD-10-CM | POA: Diagnosis not present

## 2017-01-10 MED ORDER — TAPENTADOL HCL 100 MG PO TABS: 1.0000 | ORAL_TABLET | Freq: Four times a day (QID) | ORAL | 0 refills | Status: DC | PRN

## 2017-01-10 NOTE — Progress Notes (Signed)
Subjective:    Patient ID: Sabrina Williams, female    DOB: 1970-07-11, 46 y.o.   MRN: [id number]  HPI: Sabrina Williams is a 46year old female who returns for follow up appointmentfor chronic pain and medication refill. She states her pain is located in her right hand,and right foot pain. She rates her pain 2. Her current exercise regime is walking,performing stretching exercises and light house work.  Sabrina Williams states she was walking when she lost her footing and fell forward, she states she landed on her right hand and right knee. She was able to pick herself up. She went to Urgent Care on 12/29/2016  S/P Lap Band Surgery on August 10th,2017, she has lost 123lbs she states.    Sabrina Williams last UDS was 07/27/2016 it was consistent. Oral swab ordered today.   Pain Inventory Average Pain 3 Pain Right Now 2 My pain is constant, stabbing, tingling and aching  In the last 24 hours, has pain interfered with the following? General activity 5 Relation with others 4 Enjoyment of life 4 What TIME of day is your pain at its worst? evening Sleep (in general) Fair  Pain is worse with: bending, standing and some activites Pain improves with: . Relief from Meds: 7  Mobility walk without assistance ability to climb steps?  yes do you drive?  yes  Function not employed: date last employed . I need assistance with the following:  meal prep, household duties and shopping  Neuro/Psych No problems in this area  Prior Studies Any changes since last visit?  no  Physicians involved in your care Any changes since last visit?  no   Family History  Problem Relation Age of Onset  . Cancer Mother        multiple myeloma  . Diabetes Father   . Kidney disease Father   . Heart disease Father    Social History   Social History  . Marital status: Married    Spouse name: N/A  . Number of children: N/A  . Years of education: N/A   Social History Main Topics  . Smoking  status: Never Smoker  . Smokeless tobacco: Never Used  . Alcohol use No  . Drug use: No  . Sexual activity: Not Asked   Other Topics Concern  . None   Social History Narrative  . None   Past Surgical History:  Procedure Laterality Date  . CESAREAN SECTION     x2  . dental implant  July 2015  . ENDOSCOPIC PLANTAR FASCIOTOMY Right August 2015  . LITHOTRIPSY     multiple  . SHOULDER SURGERY  6/06  . SMALL INTESTINE SURGERY     cervical laminectomy  . utereroscopy  1995   Past Medical History:  Diagnosis Date  . Chronic kidney disease    stones  . Depression   . Diabetes mellitus   . Hypertension   . Neuromuscular disorder ([place])    There were no vitals taken for this visit.  Opioid Risk Score:   Fall Risk Score:  `1  Depression screen PHQ 2/9  Depression screen [facility] 2/9 12/03/2016 09/30/2016 08/30/2016 07/25/2015 01/01/2015 08/12/2014  Decreased Interest 0 '1 1 1 1 1  '$ Down, Depressed, Hopeless 0 '1 1 1 1 1  '$ PHQ - 2 Score 0 '2 2 2 2 2  '$ Altered sleeping - - - - - 1  Tired, decreased energy - - - - - 3  Change in appetite - - - - -  2  Feeling bad or failure about yourself  - - - - - 3  Trouble concentrating - - - - - 2  Moving slowly or fidgety/restless - - - - - 1  Suicidal thoughts - - - - - 0  PHQ-9 Score - - - - - 14     Review of Systems  Constitutional: Negative.   HENT: Negative.   Eyes: Negative.   Respiratory: Negative.   Cardiovascular: Negative.   Gastrointestinal: Negative.   Endocrine: Negative.   Genitourinary: Negative.   Musculoskeletal: Negative.   Skin: Negative.   Allergic/Immunologic: Negative.   Neurological: Negative.   Hematological: Negative.   Psychiatric/Behavioral: Negative.   All other systems reviewed and are negative.      Objective:   Physical Exam  Constitutional: She is oriented to person, place, and time. She appears well-developed and well-nourished.  HENT:  Head: Normocephalic and atraumatic.  Neck: Normal range of  motion. Neck supple.  Cardiovascular: Normal rate and regular rhythm.   Pulmonary/Chest: Effort normal and breath sounds normal.  Musculoskeletal:  Normal Muscle Bulk and Muscle Testing Reveals: Upper Extremities: Full ROM and Muscle Strength 5/5 Lower Extremities: Full ROM and Muscle Strength 5/5 Arises from Table with ease Narrow Based Gait  Neurological: She is alert and oriented to person, place, and time.  Skin: Skin is warm and dry.  Psychiatric: She has a normal mood and affect.  Nursing note and vitals reviewed.         Assessment & Plan:  1.Complex regional pain syndrome right lower extremity postoperative. She's s/p post plantar fascial release. She continue withsensitivity totouch. She has diffuse numbness and tingling. Continue Gabapentin.01/10/2017 Refilled: Nucynta 100 mg one tablet four times a day. #120. We will continue the opioid monitoring program, this consists of regular clinic visits, examinations, urine drug screen, pill counts as well as use of [place] Controlled Substance reporting System. 2.Myofascial pain syndrome: Continue with ice, heat and exercise regime. 01/10/2017 3. Depression: Continue: Zoloft. 01/10/2017 4. Muscle Spasm: Continue Tizanidine: 01/10/2017 5. Cervical Post Laminectomy: Continue to Monitor. 01/10/2017 6. Cervical Radiculopathy/ Left Arm Pain :No complaints Today. Continue Gabapentin. 01/10/2017 7. Peripheral Neuropathy: Continue Gabapentin: 01/10/2017  20 minutes of face to face patient care time was spent during this visit. All questions were encouraged and answered.  F/U in 1 month

## 2017-02-14 ENCOUNTER — Encounter: Payer: Self-pay | Admitting: Registered Nurse

## 2017-02-14 ENCOUNTER — Encounter: Payer: BLUE CROSS/BLUE SHIELD | Attending: Physical Medicine & Rehabilitation | Admitting: Registered Nurse

## 2017-02-14 VITALS — BP 145/92 | HR 72

## 2017-02-14 DIAGNOSIS — M5412 Radiculopathy, cervical region: Secondary | ICD-10-CM | POA: Diagnosis not present

## 2017-02-14 DIAGNOSIS — M961 Postlaminectomy syndrome, not elsewhere classified: Secondary | ICD-10-CM

## 2017-02-14 DIAGNOSIS — Z5181 Encounter for therapeutic drug level monitoring: Secondary | ICD-10-CM | POA: Diagnosis not present

## 2017-02-14 DIAGNOSIS — Z79899 Other long term (current) drug therapy: Secondary | ICD-10-CM | POA: Diagnosis not present

## 2017-02-14 DIAGNOSIS — G8929 Other chronic pain: Secondary | ICD-10-CM

## 2017-02-14 DIAGNOSIS — G90521 Complex regional pain syndrome I of right lower limb: Secondary | ICD-10-CM | POA: Diagnosis not present

## 2017-02-14 DIAGNOSIS — M25561 Pain in right knee: Secondary | ICD-10-CM

## 2017-02-14 DIAGNOSIS — M25512 Pain in left shoulder: Secondary | ICD-10-CM | POA: Diagnosis not present

## 2017-02-14 DIAGNOSIS — M797 Fibromyalgia: Secondary | ICD-10-CM | POA: Diagnosis not present

## 2017-02-14 DIAGNOSIS — G894 Chronic pain syndrome: Secondary | ICD-10-CM | POA: Diagnosis not present

## 2017-02-14 DIAGNOSIS — M791 Myalgia: Secondary | ICD-10-CM | POA: Diagnosis not present

## 2017-02-14 DIAGNOSIS — M7918 Myalgia, other site: Secondary | ICD-10-CM

## 2017-02-14 MED ORDER — TAPENTADOL HCL 100 MG PO TABS: 1.0000 | ORAL_TABLET | Freq: Four times a day (QID) | ORAL | 0 refills | Status: DC | PRN

## 2017-02-14 NOTE — Progress Notes (Signed)
Subjective:    Patient ID: Sabrina Williams, female    DOB: 1970-06-08, 46 y.o.   MRN: [id number]  HPI: [name] is a 46year old female who returns for follow up appointmentfor chronic pain and medication refill. She states her pain is located in her left hand, left shoulder, right knee and  foot pain. She rates her pain 2. Her current exercise regime is walking,performing stretching exercises and light house work.  [name] states when she and her family were out for dinner while she was walking  she slid on the restaurant floor landed on her buttocks and her left leg was underneath her. Reports, her son helped her up. She followed up with  orthopedist Dr. Jefm Bryant.   [name] arrived hypertensive and states she had taken a sudafed for the last few days. Also stated she is compliant with her antihypertensives. Blood pressure was re-checked she was instructed to keep a blood pressure log and follow up with her PCP. She verbalizes understanding.   S/P Lap Band Surgery on August 10th,2017, she has lost 123lbs she states.    Ms. Cherne last UDS was 07/27/2016 it was consistent. Oral swab ordered today.   Pain Inventory Average Pain 4 Pain Right Now 2 My pain is constant, sharp, burning, dull, stabbing, tingling and aching  In the last 24 hours, has pain interfered with the following? General activity 6 Relation with others 3 Enjoyment of life 4 What TIME of day is your pain at its worst? evening Sleep (in general) Fair  Pain is worse with: walking, standing and some activites Pain improves with: rest, heat/ice, therapy/exercise, medication and TENS Relief from Meds: 7  Mobility walk without assistance how many minutes can you walk? 60 ability to climb steps?  yes do you drive?  yes  Function not employed: date last employed . I need assistance with the following:  meal prep, household duties and shopping  Neuro/Psych No problems in this area  Prior  Studies Any changes since last visit?  no  Physicians involved in your care Any changes since last visit?  no   Family History  Problem Relation Age of Onset  . Cancer Mother        multiple myeloma  . Diabetes Father   . Kidney disease Father   . Heart disease Father    Social History   Social History  . Marital status: Married    Spouse name: N/A  . Number of children: N/A  . Years of education: N/A   Social History Main Topics  . Smoking status: Never Smoker  . Smokeless tobacco: Never Used  . Alcohol use No  . Drug use: No  . Sexual activity: Not Asked   Other Topics Concern  . None   Social History Narrative  . None   Past Surgical History:  Procedure Laterality Date  . CESAREAN SECTION     x2  . dental implant  July 2015  . ENDOSCOPIC PLANTAR FASCIOTOMY Right August 2015  . LITHOTRIPSY     multiple  . SHOULDER SURGERY  6/06  . SMALL INTESTINE SURGERY     cervical laminectomy  . utereroscopy  1995   Past Medical History:  Diagnosis Date  . Chronic kidney disease    stones  . Depression   . Diabetes mellitus   . Hypertension   . Neuromuscular disorder ([facility])    BP (!) 160/100 (BP Location: Left Arm, Patient Position: Sitting, Cuff Size: Normal)  Pulse 77   SpO2 97%   Opioid Risk Score:  1 Fall Risk Score:  `1  Depression screen PHQ 2/9  Depression screen [facility] 2/9 02/14/2017 12/03/2016 09/30/2016 08/30/2016 07/25/2015 01/01/2015 08/12/2014  Decreased Interest 0 0 '1 1 1 1 1  '$ Down, Depressed, Hopeless 0 0 '1 1 1 1 1  '$ PHQ - 2 Score 0 0 '2 2 2 2 2  '$ Altered sleeping - - - - - - 1  Tired, decreased energy - - - - - - 3  Change in appetite - - - - - - 2  Feeling bad or failure about yourself  - - - - - - 3  Trouble concentrating - - - - - - 2  Moving slowly or fidgety/restless - - - - - - 1  Suicidal thoughts - - - - - - 0  PHQ-9 Score - - - - - - 14     Review of Systems  Constitutional: Negative.   HENT: Negative.   Eyes: Negative.     Respiratory: Negative.   Cardiovascular: Negative.   Gastrointestinal: Negative.   Endocrine: Negative.   Genitourinary: Negative.   Musculoskeletal: Negative.   Skin: Negative.   Allergic/Immunologic: Negative.   Neurological: Negative.   Hematological: Negative.   Psychiatric/Behavioral: Negative.   All other systems reviewed and are negative.      Objective:   Physical Exam  Constitutional: She is oriented to person, place, and time. She appears well-developed and well-nourished.  HENT:  Head: Normocephalic and atraumatic.  Neck: Normal range of motion. Neck supple.  Cardiovascular: Normal rate and regular rhythm.   Pulmonary/Chest: Effort normal and breath sounds normal.  Musculoskeletal:  Normal Muscle Bulk and Muscle Testing Reveals: Upper Extremities: Full ROM and Muscle Strength 5/5 Lumbar Paraspinal Tenderness: L-4-L-5 Lower Extremities: Full ROM and Muscle Strength 5/5 Right Lower Extremity Flexion Produces Pain into Right Patella Arises from Table with ease Narrow Based Gait  Neurological: She is alert and oriented to person, place, and time.  Skin: Skin is warm and dry.  Psychiatric: She has a normal mood and affect.  Nursing note and vitals reviewed.         Assessment & Plan:  1.Complex regional pain syndrome right lower extremity postoperative. She's s/p post plantar fascial release. She continue withsensitivity totouch. She has diffuse numbness and tingling. Continue Gabapentin.02/14/2017 Refilled: Nucynta 100 mg one tablet four times a day. #120. We will continue the opioid monitoring program, this consists of regular clinic visits, examinations, urine drug screen, pill counts as well as use of [place] Controlled Substance reporting System. 2.Myofascial pain syndrome: Continue with ice, heat and exercise regime. 02/14/2017 3. Depression: Continue: Zoloft. 02/14/2017 4. Muscle Spasm: Continue Tizanidine: 02/14/2017 5. Cervical Post  Laminectomy: Continue to Monitor. 02/14/2017 6. Cervical Radiculopathy/ Left Arm Pain. Continue Gabapentin. 02/14/2017 7. Peripheral Neuropathy: Continue Gabapentin: 02/14/2017 8. Left Shoulder Pain: Continue current medication regime and Continue  to Monitor. 02/14/2017 8. Acute on Chronic Right Knee Pain: Refused X-ray: Orth Following. 02/14/2017  20 minutes of face to face patient care time was spent during this visit. All questions were encouraged and answered.  F/U in 1 month

## 2017-02-18 ENCOUNTER — Other Ambulatory Visit: Payer: Self-pay | Admitting: Orthopedic Surgery

## 2017-02-18 DIAGNOSIS — M2391 Unspecified internal derangement of right knee: Secondary | ICD-10-CM

## 2017-02-18 DIAGNOSIS — M1711 Unilateral primary osteoarthritis, right knee: Secondary | ICD-10-CM

## 2017-03-11 ENCOUNTER — Encounter: Payer: Self-pay | Admitting: Registered Nurse

## 2017-03-11 ENCOUNTER — Encounter: Payer: BLUE CROSS/BLUE SHIELD | Attending: Physical Medicine & Rehabilitation | Admitting: Registered Nurse

## 2017-03-11 VITALS — BP 142/91 | HR 77

## 2017-03-11 DIAGNOSIS — G894 Chronic pain syndrome: Secondary | ICD-10-CM

## 2017-03-11 DIAGNOSIS — M961 Postlaminectomy syndrome, not elsewhere classified: Secondary | ICD-10-CM | POA: Diagnosis not present

## 2017-03-11 DIAGNOSIS — M7918 Myalgia, other site: Secondary | ICD-10-CM

## 2017-03-11 DIAGNOSIS — G90521 Complex regional pain syndrome I of right lower limb: Secondary | ICD-10-CM | POA: Diagnosis not present

## 2017-03-11 DIAGNOSIS — M797 Fibromyalgia: Secondary | ICD-10-CM | POA: Diagnosis not present

## 2017-03-11 DIAGNOSIS — Z79899 Other long term (current) drug therapy: Secondary | ICD-10-CM

## 2017-03-11 DIAGNOSIS — M5412 Radiculopathy, cervical region: Secondary | ICD-10-CM | POA: Diagnosis not present

## 2017-03-11 DIAGNOSIS — M25512 Pain in left shoulder: Secondary | ICD-10-CM | POA: Diagnosis not present

## 2017-03-11 DIAGNOSIS — G609 Hereditary and idiopathic neuropathy, unspecified: Secondary | ICD-10-CM

## 2017-03-11 DIAGNOSIS — M7712 Lateral epicondylitis, left elbow: Secondary | ICD-10-CM

## 2017-03-11 DIAGNOSIS — Z5181 Encounter for therapeutic drug level monitoring: Secondary | ICD-10-CM | POA: Diagnosis not present

## 2017-03-11 MED ORDER — METHYLPREDNISOLONE 4 MG PO TBPK: ORAL_TABLET | ORAL | 0 refills | Status: DC

## 2017-03-11 MED ORDER — TAPENTADOL HCL 100 MG PO TABS: 1.0000 | ORAL_TABLET | Freq: Four times a day (QID) | ORAL | 0 refills | Status: DC | PRN

## 2017-03-11 NOTE — Progress Notes (Deleted)
Subjective:    Patient ID: Sabrina Williams, female    DOB: December 06, 1970, 46 y.o.   MRN: [id number]  HPI: [name] is a 46year old female who returns for follow up appointmentfor chronic pain and medication refill. She states her pain is located in her left hand, left elbow and left shoulder with tingling and numbness also reports  right knee and  foot pain. She rates her pain 5. Her current exercise regime is walking,performing stretching exercises and light house work.  Discussed with Sabrina Williams EMG she will think about it and call the office if she would like to proceed, she verbalizes understanding.   Sabrina Williams Morphine equivalent is 160.00 MME.  S/P Lap Band Surgery on August 10th,2017, she has lost 123lbs she states.    Sabrina Williams last UDS was 07/27/2016 it was consistent. Oral swab ordered today.   Pain Inventory Average Pain 6 Pain Right Now 5 My pain is constant, sharp, burning, dull, stabbing, tingling and aching  In the last 24 hours, has pain interfered with the following? General activity 7 Relation with others 5 Enjoyment of life 5 What TIME of day is your pain at its worst? evening Sleep (in general) Fair  Pain is worse with: walking, bending, standing and some activites Pain improves with: rest, heat/ice and medication Relief from Meds: 4  Mobility walk without assistance how many minutes can you walk? 30 ability to climb steps?  yes do you drive?  yes  Function not employed: date last employed . I need assistance with the following:  .  Neuro/Psych No problems in this area  Prior Studies Any changes since last visit?  no  Physicians involved in your care Any changes since last visit?  no   Family History  Problem Relation Age of Onset  . Cancer Mother        multiple myeloma  . Diabetes Father   . Kidney disease Father   . Heart disease Father    Social History   Social History  . Marital status: Married    Spouse  name: N/A  . Number of children: N/A  . Years of education: N/A   Social History Main Topics  . Smoking status: Never Smoker  . Smokeless tobacco: Never Used  . Alcohol use No  . Drug use: No  . Sexual activity: Not on file   Other Topics Concern  . Not on file   Social History Narrative  . No narrative on file   Past Surgical History:  Procedure Laterality Date  . CESAREAN SECTION     x2  . dental implant  July 2015  . ENDOSCOPIC PLANTAR FASCIOTOMY Right August 2015  . LITHOTRIPSY     multiple  . SHOULDER SURGERY  6/06  . SMALL INTESTINE SURGERY     cervical laminectomy  . utereroscopy  1995   Past Medical History:  Diagnosis Date  . Chronic kidney disease    stones  . Depression   . Diabetes mellitus   . Hypertension   . Neuromuscular disorder (HCC)    BP 142/91  P  77  O2sat 98%   Opioid Risk Score:  1 Fall Risk Score:  `1  Depression screen PHQ 2/9  Depression screen [facility] 2/9 03/11/2017 02/14/2017 12/03/2016 09/30/2016 08/30/2016 07/25/2015 01/01/2015  Decreased Interest 0 0 0 '1 1 1 1  '$ Down, Depressed, Hopeless 0 0 0 '1 1 1 1  '$ PHQ - 2 Score 0 0 0 2  $'2 2 2  'A$ Altered sleeping - - - - - - -  Tired, decreased energy - - - - - - -  Change in appetite - - - - - - -  Feeling bad or failure about yourself  - - - - - - -  Trouble concentrating - - - - - - -  Moving slowly or fidgety/restless - - - - - - -  Suicidal thoughts - - - - - - -  PHQ-9 Score - - - - - - -     Review of Systems  Constitutional: Negative.   HENT: Negative.   Eyes: Negative.   Respiratory: Negative.   Cardiovascular: Negative.   Gastrointestinal: Negative.   Endocrine: Negative.   Genitourinary: Negative.   Musculoskeletal: Negative.   Skin: Negative.   Allergic/Immunologic: Negative.   Neurological: Negative.   Hematological: Negative.   Psychiatric/Behavioral: Negative.   All other systems reviewed and are negative.      Objective:      Physical Exam  Constitutional: She is  oriented to person, place, and time. She appears well-developed and well-nourished.  HENT:  Head: Normocephalic and atraumatic.  Neck: Normal range of motion. Neck supple.  Cardiovascular: Normal rate and regular rhythm.   Pulmonary/Chest: Effort normal and breath sounds normal.  Musculoskeletal:  Normal Muscle Bulk and Muscle Testing Reveals: Upper Extremities: Full ROM and Muscle Strength 5/5 Lumbar Paraspinal Tenderness: L-4-L-5 Lower Extremities: Full ROM and Muscle Strength 5/5 Right Lower Extremity Flexion Produces Pain into Right Patella Arises from Table with ease Narrow Based Gait  Neurological: She is alert and oriented to person, place, and time.  Skin: Skin is warm and dry.  Psychiatric: She has a normal mood and affect.  Nursing note and vitals reviewed.         Assessment & Plan:  1.Complex regional pain syndrome right lower extremity postoperative. She's s/p post plantar fascial release. She continue withsensitivity totouch. She has diffuse numbness and tingling. Continue Gabapentin.02/14/2017 Refilled: Nucynta 100 mg one tablet four times a day. #120. We will continue the opioid monitoring program, this consists of regular clinic visits, examinations, urine drug screen, pill counts as well as use of [place] Controlled Substance reporting System. 2.Myofascial pain syndrome: Continue with ice, heat and exercise regime. 02/14/2017 3. Depression: Continue: Zoloft. 02/14/2017 4. Muscle Spasm: Continue Tizanidine: 02/14/2017 5. Cervical Post Laminectomy: Continue to Monitor. 02/14/2017 6. Cervical Radiculopathy/ Left Arm Pain. Continue Gabapentin. 02/14/2017 7. Peripheral Neuropathy: Continue Gabapentin: 02/14/2017 8. Left Shoulder Pain: Continue current medication regime and Continue  to Monitor. 02/14/2017 8. Acute on Chronic Right Knee Pain: Refused X-ray: Orth Following. 02/14/2017  20 minutes of face to face patient care time was spent during this  visit. All questions were encouraged and answered.  F/U in 1 month

## 2017-03-16 ENCOUNTER — Telehealth: Payer: Self-pay | Admitting: Registered Nurse

## 2017-03-16 NOTE — Telephone Encounter (Signed)
On 03/15/2017 the  [place] was reviewed no conflict was seen on the [place] with multiple prescribers. Sabrina Williams has a signed narcotic contract with our office. If there were any discrepancies this would have been reported to her physician.

## 2017-03-16 NOTE — Progress Notes (Signed)
Subjective:    Patient ID: Sabrina Williams, female    DOB: [birth year], 46 y.o.   MRN: [id number]  HPI: Sabrina Williams is a 46year old female who returns for follow up appointmentfor chronic pain and medication refill. She states her pain is located in her left shoulder radiating into her left arm with tingling and numbness, left elbow pain, right knee and right foot pain. She rates her pain 5. Her current exercise regime is walking,performing stretching exercises and light house work.  Discussed with Sabrina Williams EMG she will think about it and call the office if she would like to proceed, she verbalizes understanding.   Sabrina Williams Morphine equivalent is 160.00 MME.   S/P Lap Band Surgery on August 10th,2017, she has lost 123lbs she states.    Sabrina Williams last UDS was 07/27/2016 it was consistent. Oral swab ordered today.   Pain Inventory Average Pain 6 Pain Right Now 5 My pain is constant, sharp, burning, dull, stabbing, tingling and aching  In the last 24 hours, has pain interfered with the following? General activity 7 Relation with others 5 Enjoyment of life 5 What TIME of day is your pain at its worst? night Sleep (in general) Fair  Pain is worse with: walking, bending, standing and some activites Pain improves with: rest, heat/ice and medication Relief from Meds: 4  Mobility walk without assistance how many minutes can you walk? 30 ability to climb steps?  yes do you drive?  yes  Function not employed: date last employed . Do you have any goals in this area?  no  Neuro/Psych No problems in this area  Prior Studies Any changes since last visit?  no  Physicians involved in your care Any changes since last visit?  no   Family History  Problem Relation Age of Onset  . Cancer Mother        multiple myeloma  . Diabetes Father   . Kidney disease Father   . Heart disease Father    Social History   Social History  . Marital status: Married   Spouse name: N/A  . Number of children: N/A  . Years of education: N/A   Social History Main Topics  . Smoking status: Never Smoker  . Smokeless tobacco: Never Used  . Alcohol use No  . Drug use: No  . Sexual activity: Not Asked   Other Topics Concern  . None   Social History Narrative  . None   Past Surgical History:  Procedure Laterality Date  . CESAREAN SECTION     x2  . dental implant  July 2015  . ENDOSCOPIC PLANTAR FASCIOTOMY Right August 2015  . LITHOTRIPSY     multiple  . SHOULDER SURGERY  6/06  . SMALL INTESTINE SURGERY     cervical laminectomy  . utereroscopy  1995   Past Medical History:  Diagnosis Date  . Chronic kidney disease    stones  . Depression   . Diabetes mellitus   . Hypertension   . Neuromuscular disorder (HCC)    BP (!) 142/91   Pulse 77   SpO2 98%   Opioid Risk Score:  1 Fall Risk Score:  `1  Depression screen PHQ 2/9  Depression screen [facility] 2/9 03/11/2017 02/14/2017 12/03/2016 09/30/2016 08/30/2016 07/25/2015 01/01/2015  Decreased Interest 0 0 0 _0 Down, Depressed, Hopeless 0 0 0 _1 PHQ - 2 Score 0 0 0 _2 2  Altered sleeping - - - - - - -  Tired, decreased energy - - - - - - -  Change in appetite - - - - - - -  Feeling bad or failure about yourself  - - - - - - -  Trouble concentrating - - - - - - -  Moving slowly or fidgety/restless - - - - - - -  Suicidal thoughts - - - - - - -  PHQ-9 Score - - - - - - -     Review of Systems  Constitutional: Negative.   HENT: Negative.   Eyes: Negative.   Respiratory: Negative.   Cardiovascular: Negative.   Gastrointestinal: Negative.   Endocrine: Negative.   Genitourinary: Negative.   Musculoskeletal: Negative.   Skin: Negative.   Allergic/Immunologic: Negative.   Neurological: Negative.   Hematological: Negative.   Psychiatric/Behavioral: Negative.   All other systems reviewed and are negative.      Objective:   Physical Exam  Constitutional: She is oriented to  person, place, and time. She appears well-developed and well-nourished.  HENT:  Head: Normocephalic and atraumatic.  Neck: Normal range of motion. Neck supple.  Cardiovascular: Normal rate and regular rhythm.   Pulmonary/Chest: Effort normal and breath sounds normal.  Musculoskeletal:  Normal Muscle Bulk and Muscle Testing Reveals: Upper Extremities: Full ROM and Muscle Strength 5/5 Left Epicondyle Tenderness Left AC Joint Tenderness Thoracic Paraspinal Tenderness: T-7- T-9 Lower Extremities: Full ROM and Muscle Strength 5/5 Arises from Table with ease Narrow Based Gait  Neurological: She is alert and oriented to person, place, and time.  Skin: Skin is warm and dry.  Psychiatric: She has a normal mood and affect.  Nursing note and vitals reviewed.         Assessment & Plan:  1.Complex regional pain syndrome right lower extremity postoperative. She's s/p post plantar fascial release. She continue withsensitivity totouch. She has diffuse numbness and tingling. Continue Gabapentin.03/15/2017 Refilled: Nucynta 100 mg one tablet four times a day. #120. We will continue the opioid monitoring program, this consists of regular clinic visits, examinations, urine drug screen, pill counts as well as use of [place] Controlled Substance reporting System. 2.Myofascial pain syndrome: Continue with ice, heat and exercise regime. 03/15/2017 3. Depression: Continue: Zoloft. 03/15/2017 4. Muscle Spasm: Continue Tizanidine: 03/15/2017 5. Cervical Post Laminectomy: Continue to Monitor. 03/15/2017 6. Cervical Radiculopathy/ Left Arm Pain. Continue Gabapentin. 03/15/2017 7. Peripheral Neuropathy: Continue Gabapentin: 03/15/2017 8. Left Shoulder Pain: Continue current medication regime and Continue  to Monitor. 03/15/2017 9, Left Lateral  Epicondylitis: RX: Medrol Dose Pak  20 minutes of face to face patient care time was spent during this visit. All questions were encouraged and  answered.  F/U in 1 month       

## 2017-03-22 ENCOUNTER — Other Ambulatory Visit: Payer: Self-pay | Admitting: Registered Nurse

## 2017-03-22 ENCOUNTER — Other Ambulatory Visit: Payer: Self-pay | Admitting: Physical Medicine & Rehabilitation

## 2017-04-11 ENCOUNTER — Encounter: Payer: Self-pay | Admitting: Physical Medicine & Rehabilitation

## 2017-04-11 ENCOUNTER — Encounter: Payer: BLUE CROSS/BLUE SHIELD | Attending: Physical Medicine & Rehabilitation

## 2017-04-11 ENCOUNTER — Ambulatory Visit (HOSPITAL_BASED_OUTPATIENT_CLINIC_OR_DEPARTMENT_OTHER): Payer: BLUE CROSS/BLUE SHIELD | Admitting: Physical Medicine & Rehabilitation

## 2017-04-11 VITALS — BP 161/97 | HR 76

## 2017-04-11 DIAGNOSIS — M961 Postlaminectomy syndrome, not elsewhere classified: Secondary | ICD-10-CM

## 2017-04-11 DIAGNOSIS — M5412 Radiculopathy, cervical region: Secondary | ICD-10-CM | POA: Diagnosis not present

## 2017-04-11 DIAGNOSIS — G90521 Complex regional pain syndrome I of right lower limb: Secondary | ICD-10-CM | POA: Insufficient documentation

## 2017-04-11 DIAGNOSIS — R200 Anesthesia of skin: Secondary | ICD-10-CM | POA: Diagnosis not present

## 2017-04-11 DIAGNOSIS — M797 Fibromyalgia: Secondary | ICD-10-CM | POA: Insufficient documentation

## 2017-04-11 DIAGNOSIS — Z5181 Encounter for therapeutic drug level monitoring: Secondary | ICD-10-CM | POA: Diagnosis not present

## 2017-04-11 MED ORDER — TAPENTADOL HCL 100 MG PO TABS: 1.0000 | ORAL_TABLET | Freq: Four times a day (QID) | ORAL | 0 refills | Status: DC | PRN

## 2017-04-11 NOTE — Patient Instructions (Addendum)
  EMG/NCV next visit

## 2017-04-11 NOTE — Progress Notes (Signed)
Subjective:    Patient ID: Sabrina Williams, female    DOB: 1970-06-14, 46 y.o.   MRN: [id number]  HPI  46 year old female who has a chronic left C6 radiculopathy as well as myofascial pain syndrome.  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  She has had no issues with her medication. Fall in August onto R hand Fall  Sept 9, ?twisted her arm, no other sequela until 3 wks later left elbow and 4th and 5th finger numbness Increased weakness in both hands Also has noted weakness , dropping objects in Right hand Denies any nocturnal pain no pain with driving or with reading a book. Neck pain has not increased Tried a steroid dose pack with minimal effect. Pain Inventory Average Pain 5 Pain Right Now 5 My pain is constant, sharp, burning, dull, stabbing, tingling and aching  In the last 24 hours, has pain interfered with the following? General activity 7 Relation with others 6 Enjoyment of life 6 What TIME of day is your pain at its worst? evening Sleep (in general) Fair  Pain is worse with: walking, standing and some activites Pain improves with: rest, heat/ice and medication Relief from Meds: 7  Mobility walk without assistance ability to climb steps?  yes do you drive?  yes  Function Do you have any goals in this area?  no  Neuro/Psych weakness numbness depression anxiety  Prior Studies Any changes since last visit?  no  Physicians involved in your care Any changes since last visit?  no   Family History  Problem Relation Age of Onset  . Cancer Mother        multiple myeloma  . Diabetes Father   . Kidney disease Father   . Heart disease Father    Social History   Socioeconomic History  . Marital status: Married    Spouse name: None  . Number of children: None  . Years of education: None  . Highest education level: None   Social Needs  . Financial resource strain: None  . Food insecurity - worry: None  . Food insecurity - inability: None  . Transportation needs - medical: None  . Transportation needs - non-medical: None  Occupational History  . None  Tobacco Use  . Smoking status: Never Smoker  . Smokeless tobacco: Never Used  Substance and Sexual Activity  . Alcohol use: No    Alcohol/week: 0.0 oz  . Drug use: No  . Sexual activity: None  Other Topics Concern  . None  Social History Narrative  . None   Past Surgical History:  Procedure Laterality Date  . CESAREAN SECTION     x2  . dental implant  July 2015  . ENDOSCOPIC PLANTAR FASCIOTOMY Right August 2015  . LITHOTRIPSY     multiple  . SHOULDER SURGERY  6/06  . SMALL INTESTINE SURGERY     cervical laminectomy  . utereroscopy  1995   Past Medical History:  Diagnosis Date  . Chronic kidney disease    stones  . Depression   . Diabetes mellitus   . Hypertension   . Neuromuscular disorder (HCC)    BP (!) 161/97   Pulse 76   SpO2 97%   Opioid Risk Score:   Fall Risk Score:  `1  Depression screen [facility] 2/9  Depression screen [facility] 2/9 03/11/2017 02/14/2017 12/03/2016 09/30/2016 08/30/2016 07/25/2015 01/01/2015  Decreased Interest 0 0 0 '1 1 1 1  '$ Down, Depressed, Hopeless 0 0 0 '1 1 1 1  '$ PHQ - 2 Score 0 0 0 '2 2 2 2  '$ Altered sleeping - - - - - - -  Tired, decreased energy - - - - - - -  Change in appetite - - - - - - -  Feeling bad or failure about yourself  - - - - - - -  Trouble concentrating - - - - - - -  Moving slowly or fidgety/restless - - - - - - -  Suicidal thoughts - - - - - - -  PHQ-9 Score - - - - - - -     Review of Systems  Constitutional: Negative.   HENT: Negative.   Eyes: Negative.   Respiratory: Negative.   Cardiovascular: Negative.   Gastrointestinal: Negative.   Endocrine: Negative.   Genitourinary: Negative.   Musculoskeletal: Negative.   Skin: Negative.   Allergic/Immunologic: Negative.   Neurological:  Negative.   Hematological: Negative.   Psychiatric/Behavioral: Negative.   All other systems reviewed and are negative.      Objective:   Physical Exam  Constitutional: She is oriented to person, place, and time. She appears well-developed and well-nourished.  HENT:  Head: Normocephalic and atraumatic.  Neck: Normal range of motion.  Neurological: She is alert and oriented to person, place, and time. She displays no atrophy. No sensory deficit. She exhibits normal muscle tone. Coordination normal.  Reflex Scores:      Tricep reflexes are 2+ on the right side and 3+ on the left side.      Bicep reflexes are 2+ on the right side and 3+ on the left side.      Brachioradialis reflexes are 2+ on the right side and 3+ on the left side. Skin: Skin is warm and dry.  Nursing note and vitals reviewed. Mild tenderness to palpation in left trapezius muscle. Negative Spurling sign Gait without evidence of ataxia Mood and affect are appropriate, speech is fluent Motor strength is 5/5 bilateral deltoid bicep tricep grip hip flexion knee extension ankle dorsiflexion. Sensation mildly diminished in the C6 distribution on the left side only. Negative Tinel's at the wrist and elbow Negative Spurling's         Assessment & Plan:  #1.  History of cervical postlaminectomy syndrome with chronic left C6 radiculopathy.  At this point I do not see any signs of a new radiculopathy. 2.  Left elbow pain with fourth and fifth digit intermittent tingling and numbness after a fall.  I suspect she may have an ulnar contusion.  Will check EMG/NCV, left upper extremity 2 3.  Right hand numbness dropping objects, negative examination but symptoms still suggestive of median compression at the wrist will check EMG/NCV right upper extremity.  Over half of the 25 min visit was spent counseling and coordinating care. We discussed the patient's symptoms with the patient and her husband.  Answered questions.  At this  point I do not see any indication for imaging studies.  If EMG/NCV is negative consider MRI of the cervical spine

## 2017-04-29 ENCOUNTER — Telehealth: Payer: Self-pay | Admitting: *Deleted

## 2017-04-29 NOTE — Telephone Encounter (Signed)
Patient is having dental surgery on Monday under sedation and is wondering if that will affect the results of her EMG/NCV test on Tuesday. Please advise

## 2017-04-29 NOTE — Telephone Encounter (Signed)
There should not be any problem with the EMG after dental surgery

## 2017-04-29 NOTE — Telephone Encounter (Signed)
Patient called and notified.

## 2017-05-03 ENCOUNTER — Encounter: Payer: BLUE CROSS/BLUE SHIELD | Attending: Physical Medicine & Rehabilitation

## 2017-05-03 ENCOUNTER — Encounter: Payer: Self-pay | Admitting: Physical Medicine & Rehabilitation

## 2017-05-03 ENCOUNTER — Ambulatory Visit: Payer: BLUE CROSS/BLUE SHIELD | Admitting: Physical Medicine & Rehabilitation

## 2017-05-03 ENCOUNTER — Other Ambulatory Visit: Payer: Self-pay

## 2017-05-03 VITALS — BP 93/66 | HR 96

## 2017-05-03 DIAGNOSIS — M961 Postlaminectomy syndrome, not elsewhere classified: Secondary | ICD-10-CM | POA: Insufficient documentation

## 2017-05-03 DIAGNOSIS — M5412 Radiculopathy, cervical region: Secondary | ICD-10-CM | POA: Diagnosis not present

## 2017-05-03 DIAGNOSIS — M797 Fibromyalgia: Secondary | ICD-10-CM | POA: Diagnosis not present

## 2017-05-03 DIAGNOSIS — Z5181 Encounter for therapeutic drug level monitoring: Secondary | ICD-10-CM | POA: Insufficient documentation

## 2017-05-03 DIAGNOSIS — M5010 Cervical disc disorder with radiculopathy, unspecified cervical region: Secondary | ICD-10-CM

## 2017-05-03 DIAGNOSIS — G90521 Complex regional pain syndrome I of right lower limb: Secondary | ICD-10-CM | POA: Insufficient documentation

## 2017-05-03 MED ORDER — TAPENTADOL HCL 100 MG PO TABS: 1.0000 | ORAL_TABLET | Freq: Four times a day (QID) | ORAL | 0 refills | Status: DC | PRN

## 2017-05-03 NOTE — Progress Notes (Signed)
EMG/NCV performed today.  Complaints of increasing weakness in both hands left upper extremity shoulder numbness as well as left little finger numbness.  Dropping objects with right hand. Please refer to full EMG/NCV report under the media section. Briefly patient had a normal electrodiagnostic study. We discussed that EMG/NCV is only about 75% accurate for the diagnosis of cervical radiculopathy but at least 90% accurate for diagnosis of ulnar or median neuropathy. We discussed that the next step to evaluate her current complaints would be MRI of the cervical spine without contrast. And follow-up approximately 1 month Pain medication prescription written today.

## 2017-05-03 NOTE — Patient Instructions (Signed)
MRI cervical spine ordered at [facility], they should call you to schedule

## 2017-05-05 ENCOUNTER — Telehealth: Payer: Self-pay

## 2017-05-05 NOTE — Telephone Encounter (Signed)
These call in Valium 10 mg prior to procedure number 1 tablet no refill

## 2017-05-05 NOTE — Telephone Encounter (Signed)
Patient called today, stated is scheduled for an MRI to be done Tuesday 18th of December, sated is extremely claustrophobic and is wondering if we can prescribe something for her for this procedure.

## 2017-05-06 MED ORDER — DIAZEPAM 10 MG PO TABS: 10.0000 mg | ORAL_TABLET | ORAL | 0 refills | Status: DC

## 2017-05-06 NOTE — Telephone Encounter (Signed)
medication ordered and called into pharmacy, called and notified patient about medication and to inform her to take it 1-2 hrs prior to her procedure and to insure she has someone take her to procedure while using this medication.

## 2017-05-10 ENCOUNTER — Ambulatory Visit
Admission: RE | Admit: 2017-05-10 | Discharge: 2017-05-10 | Disposition: A | Discharge status: Home / Self Care | Payer: BLUE CROSS/BLUE SHIELD | Source: Ambulatory Visit | Attending: Physical Medicine & Rehabilitation | Admitting: Physical Medicine & Rehabilitation

## 2017-05-10 DIAGNOSIS — M4802 Spinal stenosis, cervical region: Secondary | ICD-10-CM | POA: Insufficient documentation

## 2017-05-10 DIAGNOSIS — M5010 Cervical disc disorder with radiculopathy, unspecified cervical region: Secondary | ICD-10-CM

## 2017-05-10 DIAGNOSIS — M2578 Osteophyte, vertebrae: Secondary | ICD-10-CM | POA: Diagnosis not present

## 2017-05-11 ENCOUNTER — Ambulatory Visit: Payer: BLUE CROSS/BLUE SHIELD | Admitting: Registered Nurse

## 2017-05-31 ENCOUNTER — Telehealth: Payer: Self-pay

## 2017-05-31 NOTE — Telephone Encounter (Signed)
Patient called requesting recent MRI RESULTS

## 2017-05-31 NOTE — Telephone Encounter (Signed)
The degenerative disc at the level below the fusion has progressed and may be causing increased neck pain as well as little finger numbness and hand weakness.  She can either try a cervical epidural and will make the appropriate referral for this or follow-up with her surgeon.  We can discuss this further at next visit as well.

## 2017-06-01 NOTE — Telephone Encounter (Signed)
Spoke with patient on phone, relayed information to her from the doctor, transferred her to front desk to be placed on follow up for Dr, Letta Pate.

## 2017-06-03 ENCOUNTER — Ambulatory Visit: Payer: BLUE CROSS/BLUE SHIELD | Admitting: Physical Medicine & Rehabilitation

## 2017-06-03 ENCOUNTER — Encounter: Payer: Self-pay | Admitting: Physical Medicine & Rehabilitation

## 2017-06-03 ENCOUNTER — Encounter: Payer: BLUE CROSS/BLUE SHIELD | Attending: Physical Medicine & Rehabilitation

## 2017-06-03 VITALS — BP 122/82 | HR 79 | Resp 14

## 2017-06-03 DIAGNOSIS — M5412 Radiculopathy, cervical region: Secondary | ICD-10-CM | POA: Diagnosis not present

## 2017-06-03 DIAGNOSIS — M797 Fibromyalgia: Secondary | ICD-10-CM | POA: Insufficient documentation

## 2017-06-03 DIAGNOSIS — G90521 Complex regional pain syndrome I of right lower limb: Secondary | ICD-10-CM | POA: Insufficient documentation

## 2017-06-03 DIAGNOSIS — M961 Postlaminectomy syndrome, not elsewhere classified: Secondary | ICD-10-CM

## 2017-06-03 DIAGNOSIS — Z5181 Encounter for therapeutic drug level monitoring: Secondary | ICD-10-CM | POA: Insufficient documentation

## 2017-06-03 MED ORDER — TAPENTADOL HCL 100 MG PO TABS: 1.0000 | ORAL_TABLET | Freq: Four times a day (QID) | ORAL | 0 refills | Status: DC | PRN

## 2017-06-03 MED ORDER — TIZANIDINE HCL 4 MG PO TABS: 4.0000 mg | ORAL_TABLET | Freq: Every day | ORAL | 2 refills | Status: DC | PRN

## 2017-06-03 NOTE — Patient Instructions (Addendum)
Tennis elbow splint to Left    Call Dr Ronnald Ramp to schedule appt in 1-2 mo

## 2017-06-03 NOTE — Progress Notes (Signed)
Subjective:    Patient ID: Sabrina Williams, female    DOB: [birth year], 47 y.o.   MRN: [id number]  HPI Chief complaint is neck pain and left upper extremity pain.  47 year old female with history of cervical degenerative disc who has a chronic left C6 radiculopathy status post C6-C7 ACDF performed by Dr. Ronnald Ramp around 2006 She was functioning well on minimal narcotic analgesics until she developed complex regional pain syndrome in the right lower limb following Achilles tendon surgery in 2016.  She has been on Nucynta since that time.  Over the last 3-4 months the patient has developed increasing left elbow as well as left medial hand pain. She has been diagnosed with tennis elbow and treated with Medrol Dosepak without much relief.  The patient underwent EMG/NCV of the left upper extremity but no evidence of median or ulnar neuropathy was seen. She underwent MRI of the cervical spine and is here to follow-up on the results.  Pain Inventory Average Pain 6 Pain Right Now 4 My pain is constant, sharp, burning, stabbing, tingling and aching  In the last 24 hours, has pain interfered with the following? General activity 8 Relation with others 6 Enjoyment of life 7 What TIME of day is your pain at its worst? evening Sleep (in general) Fair  Pain is worse with: standing and some activites Pain improves with: rest, heat/ice and medication Relief from Meds: 6  Mobility walk without assistance use a cane how many minutes can you walk? 45-60 ability to climb steps?  yes do you drive?  yes Do you have any goals in this area?  yes  Function not employed: date last employed . I need assistance with the following:  meal prep, household duties and shopping Do you have any goals in this area?  no  Neuro/Psych weakness numbness  Prior Studies Any changes since last visit?  no MRI CERVICAL SPINE WITHOUT CONTRAST  TECHNIQUE: Multiplanar, multisequence MR imaging of the cervical  spine was performed. No intravenous contrast was administered.  COMPARISON:  CT myelogram 02/02/2010.  MRI 11/19/2009  FINDINGS: Alignment: Straightening of the normal cervical lordosis.  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 Previous ACDF C6-7. Discogenic marrow edema at C7-T1.  Cord: No cord compression or primary cord lesion.  Posterior Fossa, vertebral arteries, paraspinal tissues: Negative  Disc levels:  No stenosis at the foramen magnum, C1-2 or C2-3.  C3-4: Disc bulge without central canal stenosis. Mild osteophytic foraminal narrowing. Facet degeneration worse on the left. No likely neural compression however.  C4-5:  Mild disc bulge.  No stenosis.  C5-6: Disc bulge and uncovertebral hypertrophy. No compressive stenosis.  C6-7: Previous ACDF has a good appearance with wide patency of the canal and foramina.  C7-T1: Worsened adjacent segment degenerative disease at this level. Loss of disc height. Endplate edematous changes which could be associated with neck pain. Endplate osteophytes and bulging of the disc. Narrowing of the ventral subarachnoid space but no compression of the cord. Mild bilateral foraminal encroachment.  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 Loss of disc height. Endplate edema which could be associated with neck pain. Endplate osteophytes and bulging of the disc with mild bilateral foraminal narrowing.  Chronic degenerative changes in the upper cervical region without apparent change or likely compressive stenosis.   Electronically Signed   By: Nelson Chimes M.D.   On: 05/10/2017 09:14 Physicians involved in your care Any changes since last visit?  no   Family History  Problem  Relation Age of Onset  . Cancer Mother        multiple myeloma  . Diabetes Father   . Kidney disease Father   . Heart disease Father    Social History   Socioeconomic History  . Marital  status: Married    Spouse name: None  . Number of children: None  . Years of education: None  . Highest education level: None  Social Needs  . Financial resource strain: None  . Food insecurity - worry: None  . Food insecurity - inability: None  . Transportation needs - medical: None  . Transportation needs - non-medical: None  Occupational History  . None  Tobacco Use  . Smoking status: Never Smoker  . Smokeless tobacco: Never Used  Substance and Sexual Activity  . Alcohol use: No    Alcohol/week: 0.0 oz  . Drug use: No  . Sexual activity: None  Other Topics Concern  . None  Social History Narrative  . None   Past Surgical History:  Procedure Laterality Date  . CESAREAN SECTION     x2  . dental implant  July 2015  . ENDOSCOPIC PLANTAR FASCIOTOMY Right August 2015  . LITHOTRIPSY     multiple  . SHOULDER SURGERY  6/06  . SMALL INTESTINE SURGERY     cervical laminectomy  . utereroscopy  1995   Past Medical History:  Diagnosis Date  . Chronic kidney disease    stones  . Depression   . Diabetes mellitus   . Hypertension   . Neuromuscular disorder (HCC)    BP 122/82 (BP Location: Right Arm, Patient Position: Sitting, Cuff Size: Normal)   Pulse 79   Resp 14   SpO2 98%   Opioid Risk Score:   Fall Risk Score:  `1  Depression screen PHQ 2/9  Depression screen [facility] 2/9 03/11/2017 02/14/2017 12/03/2016 09/30/2016 08/30/2016 07/25/2015 01/01/2015  Decreased Interest 0 0 0 '1 1 1 1  '$ Down, Depressed, Hopeless 0 0 0 '1 1 1 1  '$ PHQ - 2 Score 0 0 0 '2 2 2 2  '$ Altered sleeping - - - - - - -  Tired, decreased energy - - - - - - -  Change in appetite - - - - - - -  Feeling bad or failure about yourself  - - - - - - -  Trouble concentrating - - - - - - -  Moving slowly or fidgety/restless - - - - - - -  Suicidal thoughts - - - - - - -  PHQ-9 Score - - - - - - -    Review of Systems  HENT: Negative.   Eyes: Negative.   Respiratory: Negative.   Cardiovascular: Negative.     Gastrointestinal: Negative.   Endocrine: Negative.   Genitourinary: Negative.   Musculoskeletal: Positive for arthralgias and neck pain.  Skin: Negative.   Neurological: Positive for weakness and numbness.  Hematological: Negative.   Psychiatric/Behavioral: Negative.   All other systems reviewed and are negative.      Objective:   Physical Exam  Constitutional: She appears well-developed and well-nourished. No distress.  HENT:  Head: Normocephalic and atraumatic.  Eyes: Conjunctivae and EOM are normal. Pupils are equal, round, and reactive to light.  Neck:  Cervical spine with 75% range of motion flexion extension lateral bending and rotation.  She had experiences a pulling sensation when looking to the right side and this is experienced on the left side of her neck.  Skin:  She is not diaphoretic.  Nursing note and vitals reviewed.  Negative foraminal compression test Motor strength is 5/5 bilateral deltoid biceps triceps 4/5 left grip 5/5 right grip Sensation is reduced in the left C6 and left C8 dermatomal distribution.  Normal at C7 normal on the right side. Left shoulder has no pain to palpation.  Negative impingement sign. Ambulates without assistive device no toe drag or knee instability.  Left elbow has tenderness palpation over the lateral epicondyle.  No masses palpable.  Full range of motion at the left elbow     Assessment & Plan:  #1.  Cervical postlaminectomy syndrome with chronic pain she has a chronic left C6 radiculopathy but now is exhibiting some symptoms that are consistent with left C8.  She does show fairly advanced adjacent level degeneration at C7-T1 which would correspond with some of her symptoms. I recommend that she follows up with neurosurgery for this. She will continue her Nucynta 100 mg 4 times daily We will not prescribe a Medrol Dosepak secondary to no relief when this was prescribed a few months ago.  2.  Left lateral epicondylitis recommend  tennis elbow strap. 3.  Complex regional pain syndrome right lower extremity continue Nucynta 100 g 4 times daily Continue gabapentin 600 mg 4 times daily  Physical medicine and rehab follow-up 1 month

## 2017-07-01 ENCOUNTER — Encounter: Payer: BLUE CROSS/BLUE SHIELD | Attending: Physical Medicine & Rehabilitation | Admitting: Registered Nurse

## 2017-07-01 ENCOUNTER — Encounter: Payer: Self-pay | Admitting: Registered Nurse

## 2017-07-01 VITALS — BP 137/88 | HR 85

## 2017-07-01 DIAGNOSIS — M5412 Radiculopathy, cervical region: Secondary | ICD-10-CM | POA: Insufficient documentation

## 2017-07-01 DIAGNOSIS — M961 Postlaminectomy syndrome, not elsewhere classified: Secondary | ICD-10-CM

## 2017-07-01 DIAGNOSIS — G894 Chronic pain syndrome: Secondary | ICD-10-CM

## 2017-07-01 DIAGNOSIS — M797 Fibromyalgia: Secondary | ICD-10-CM | POA: Insufficient documentation

## 2017-07-01 DIAGNOSIS — R200 Anesthesia of skin: Secondary | ICD-10-CM | POA: Diagnosis not present

## 2017-07-01 DIAGNOSIS — R202 Paresthesia of skin: Secondary | ICD-10-CM | POA: Diagnosis not present

## 2017-07-01 DIAGNOSIS — Z79891 Long term (current) use of opiate analgesic: Secondary | ICD-10-CM

## 2017-07-01 DIAGNOSIS — G90521 Complex regional pain syndrome I of right lower limb: Secondary | ICD-10-CM | POA: Diagnosis not present

## 2017-07-01 DIAGNOSIS — M5010 Cervical disc disorder with radiculopathy, unspecified cervical region: Secondary | ICD-10-CM | POA: Diagnosis not present

## 2017-07-01 DIAGNOSIS — Z5181 Encounter for therapeutic drug level monitoring: Secondary | ICD-10-CM

## 2017-07-01 DIAGNOSIS — M7918 Myalgia, other site: Secondary | ICD-10-CM | POA: Diagnosis not present

## 2017-07-01 MED ORDER — TAPENTADOL HCL 100 MG PO TABS: 1.0000 | ORAL_TABLET | Freq: Four times a day (QID) | ORAL | 0 refills | Status: DC | PRN

## 2017-07-01 NOTE — Progress Notes (Signed)
Subjective:    Patient ID: Sabrina Williams, female    DOB: 12-21-1970, 47 y.o.   MRN: [id number]  HPI: Sabrina Williams is a 47year old female who returns for follow up appointmentfor chronic pain and medication refill. She states her pain is located in her left arm with tingling and burning radiating into her left elbow. Also reports she's experiencing increase intensity of nerve pain ( neuropathic pain). She was instructed to increase gabapentin and call office in a week, she verbalizes understanding.  Also states she has an appointment with Dr. Ronnald Ramp on Monday 07/04/2017. She rates her pain 6. Her current exercise regime is walking.   Sabrina Williams Morphine equivalent is 149.33 MME.   S/P Lap Band Surgery on August 10th,2017, she has lost 123lbs she reports.    Sabrina Williams Oral Swab on 12/03/2016 it was consistent. UDS was ordered today.    Pain Inventory Average Pain 7 Pain Right Now 6 My pain is constant, sharp, burning, dull, stabbing, tingling and aching  In the last 24 hours, has pain interfered with the following? General activity 7 Relation with others 7 Enjoyment of life 7 What TIME of day is your pain at its worst? night Sleep (in general) Fair  Pain is worse with: walking, bending, standing and some activites Pain improves with: rest, heat/ice and medication Relief from Meds: 5  Mobility walk without assistance how many minutes can you walk? 60 ability to climb steps?  yes do you drive?  yes  Function I need assistance with the following:  meal prep, household duties and shopping Do you have any goals in this area?  no  Neuro/Psych numbness  Prior Studies Any changes since last visit?  no  Physicians involved in your care Any changes since last visit?  no   Family History  Problem Relation Age of Onset  . Cancer Mother        multiple myeloma  . Diabetes Father   . Kidney disease Father   . Heart disease Father    Social History    Socioeconomic History  . Marital status: Married    Spouse name: None  . Number of children: None  . Years of education: None  . Highest education level: None  Social Needs  . Financial resource strain: None  . Food insecurity - worry: None  . Food insecurity - inability: None  . Transportation needs - medical: None  . Transportation needs - non-medical: None  Occupational History  . None  Tobacco Use  . Smoking status: Never Smoker  . Smokeless tobacco: Never Used  Substance and Sexual Activity  . Alcohol use: No    Alcohol/week: 0.0 oz  . Drug use: No  . Sexual activity: None  Other Topics Concern  . None  Social History Narrative  . None   Past Surgical History:  Procedure Laterality Date  . CESAREAN SECTION     x2  . dental implant  July 2015  . ENDOSCOPIC PLANTAR FASCIOTOMY Right August 2015  . LITHOTRIPSY     multiple  . SHOULDER SURGERY  6/06  . SMALL INTESTINE SURGERY     cervical laminectomy  . utereroscopy  1995   Past Medical History:  Diagnosis Date  . Chronic kidney disease    stones  . Depression   . Diabetes mellitus   . Hypertension   . Neuromuscular disorder (HCC)    BP 137/88   Pulse 85   SpO2 95%  Opioid Risk Score:  1 Fall Risk Score:  `1  Depression screen PHQ 2/9  Depression screen [facility] 2/9 07/01/2017 03/11/2017 02/14/2017 12/03/2016 09/30/2016 08/30/2016 07/25/2015  Decreased Interest 0 0 0 0 '1 1 1  '$ Down, Depressed, Hopeless 0 0 0 0 '1 1 1  '$ PHQ - 2 Score 0 0 0 0 '2 2 2  '$ Altered sleeping - - - - - - -  Tired, decreased energy - - - - - - -  Change in appetite - - - - - - -  Feeling bad or failure about yourself  - - - - - - -  Trouble concentrating - - - - - - -  Moving slowly or fidgety/restless - - - - - - -  Suicidal thoughts - - - - - - -  PHQ-9 Score - - - - - - -     Review of Systems  Constitutional: Negative.   HENT: Negative.   Eyes: Negative.   Respiratory: Negative.   Cardiovascular: Negative.   Gastrointestinal:  Negative.   Endocrine: Negative.   Genitourinary: Negative.   Musculoskeletal: Negative.   Skin: Negative.   Allergic/Immunologic: Negative.   Neurological: Positive for numbness.  Hematological: Negative.   Psychiatric/Behavioral: Negative.   All other systems reviewed and are negative.      Objective:   Physical Exam  Constitutional: She is oriented to person, place, and time. She appears well-developed and well-nourished.  HENT:  Head: Normocephalic and atraumatic.  Neck: Normal range of motion. Neck supple.  Cardiovascular: Normal rate and regular rhythm.  Pulmonary/Chest: Effort normal and breath sounds normal.  Musculoskeletal:  Normal Muscle Bulk and Muscle Testing Reveals: Upper Extremities: Full ROM and Muscle Strength 5/5 Left AC Joint Tenderness Left Epicondyle Tenderness Thoracic Hypersensitivity: T-1-T-6 Lower Extremities: Full ROM and Muscle Strength 5/5 Arises from Table with ease Narrow Based Gait  Neurological: She is alert and oriented to person, place, and time.  Skin: Skin is warm and dry.  Psychiatric: She has a normal mood and affect.  Nursing note and vitals reviewed.         Assessment & Plan:  1.Complex regional pain syndrome right lower extremity postoperative. She's s/p post plantar fascial release. She continue withsensitivity totouch. She has diffuse numbness and tingling. Continue Gabapentin.07/01/2017 Refilled: Nucynta 100 mg one tablet four times a day. #120. We will continue the opioid monitoring program, this consists of regular clinic visits, examinations, urine drug screen, pill counts as well as use of [place] Controlled Substance reporting System. 2.Myofascial pain syndrome:Continue Tizanidine and  ice, heat and exercise regime. 07/01/2017 3. Depression: Continue: Zoloft. 07/01/2017 4. Muscle Spasm: Continue Tizanidine: 07/01/2017 5. Cervical Post Laminectomy: Continue to Monitor. 07/01/2017 6. Cervical Radiculopathy/  Left Arm Pain.Increase Gabapentin. 07/01/2017 7. Peripheral Neuropathy: Continue Gabapentin: 07/01/2017 8. Left Shoulder Pain: Continue current medication regime and Continue  to Monitor. 07/01/2017  20 minutes of face to face patient care time was spent during this visit. All questions were encouraged and answered.  F/U in 1 month

## 2017-07-01 NOTE — Patient Instructions (Signed)
Increase Gabapentin to 5 times a day call office in a week to evaluate medication changes

## 2017-07-08 ENCOUNTER — Telehealth: Payer: Self-pay

## 2017-07-08 LAB — TOXASSURE SELECT,+ANTIDEPR,UR

## 2017-07-08 NOTE — Telephone Encounter (Signed)
Patient called to let Zella Ball know that the increase in Gabapentin is helping quite a lot, thou pain is still there it is a lot more manageable.   She stated she has enough to last until next week but will need a new prescription soon.

## 2017-07-11 ENCOUNTER — Telehealth: Payer: Self-pay | Admitting: *Deleted

## 2017-07-11 MED ORDER — GABAPENTIN 600 MG PO TABS: ORAL_TABLET | ORAL | 3 refills | Status: DC

## 2017-07-11 NOTE — Telephone Encounter (Signed)
Urine drug screen for this encounter is consistent for prescribed medications, but it is also showing the presence of ETOH. This is the first time ETOH has been present. A warning letter will be mailed.  There is evidence of taking some form of benzodiazepine as there is oxazepam present in very small level. She was prescribed a valium in December and this may be a result of taking that medication.

## 2017-07-19 ENCOUNTER — Ambulatory Visit: Payer: BLUE CROSS/BLUE SHIELD | Attending: Neurological Surgery | Admitting: Physical Therapy

## 2017-07-19 ENCOUNTER — Encounter: Payer: Self-pay | Admitting: Physical Therapy

## 2017-07-19 DIAGNOSIS — M2560 Stiffness of unspecified joint, not elsewhere classified: Secondary | ICD-10-CM | POA: Insufficient documentation

## 2017-07-19 DIAGNOSIS — M79602 Pain in left arm: Secondary | ICD-10-CM | POA: Insufficient documentation

## 2017-07-19 DIAGNOSIS — M6281 Muscle weakness (generalized): Secondary | ICD-10-CM | POA: Diagnosis present

## 2017-07-19 DIAGNOSIS — R293 Abnormal posture: Secondary | ICD-10-CM | POA: Diagnosis present
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
## 2017-07-21 ENCOUNTER — Other Ambulatory Visit: Payer: Self-pay | Admitting: Physical Medicine & Rehabilitation

## 2017-07-21 ENCOUNTER — Ambulatory Visit: Payer: BLUE CROSS/BLUE SHIELD | Admitting: Physical Therapy

## 2017-07-21 ENCOUNTER — Encounter: Payer: Self-pay | Admitting: Physical Therapy

## 2017-07-21 DIAGNOSIS — M2560 Stiffness of unspecified joint, not elsewhere classified: Secondary | ICD-10-CM

## 2017-07-21 DIAGNOSIS — M6281 Muscle weakness (generalized): Secondary | ICD-10-CM

## 2017-07-21 DIAGNOSIS — M79602 Pain in left arm: Secondary | ICD-10-CM | POA: Diagnosis not present

## 2017-07-21 DIAGNOSIS — R293 Abnormal posture: Secondary | ICD-10-CM
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
## 2017-07-25 ENCOUNTER — Ambulatory Visit: Payer: BLUE CROSS/BLUE SHIELD | Attending: Neurological Surgery | Admitting: Physical Therapy

## 2017-07-25 ENCOUNTER — Encounter: Payer: Self-pay | Admitting: Physical Therapy

## 2017-07-25 DIAGNOSIS — R293 Abnormal posture: Secondary | ICD-10-CM | POA: Diagnosis present

## 2017-07-25 DIAGNOSIS — M79602 Pain in left arm: Secondary | ICD-10-CM | POA: Insufficient documentation

## 2017-07-25 DIAGNOSIS — M6281 Muscle weakness (generalized): Secondary | ICD-10-CM | POA: Insufficient documentation

## 2017-07-25 DIAGNOSIS — M2560 Stiffness of unspecified joint, not elsewhere classified: Secondary | ICD-10-CM | POA: Diagnosis present

## 2017-07-25 NOTE — Therapy (Signed)
[facility] [address] [place], [place], [address] Phone: [phone]   Fax:  [phone]  Physical Therapy Treatment  Patient Details  Name: Sabrina Williams MRN: [id number] Date of Birth: 1971-03-08 Referring Provider: Eustace Moore, MD   Encounter Date: 07/25/2017  PT End of Session - 07/31/17 2006    Visit Number  3    Number of Visits  8    Date for PT Re-Evaluation  08/16/17    PT Start Time  7494    PT Stop Time  1344    PT Time Calculation (min)  49 min    Activity Tolerance  Patient tolerated treatment well    Behavior During Therapy  [facility] for tasks assessed/performed         Past Medical History:  Diagnosis Date  . Chronic kidney disease    stones  . Depression   . Diabetes mellitus   . Hypertension   . Neuromuscular disorder [facility])     Past Surgical History:  Procedure Laterality Date  . CESAREAN SECTION     x2  . dental implant  July 2015  . ENDOSCOPIC PLANTAR FASCIOTOMY Right August 2015  . LITHOTRIPSY     multiple  . SHOULDER SURGERY  6/06  . SMALL INTESTINE SURGERY     cervical laminectomy  . utereroscopy  1995    There were no vitals filed for this visit.     Pt. states she is okay in L UE but R arm is hurting today. Pt. brought in new insurance information and Debby is checking/ scanning.      There.ex.:  Nautilus: 30# lat. Pull downs/ tricep ext./ scap. Retraction 10x2 each.  Standing B shoulder AROM (all planes)- reassessment Supine 3# ex.: press-ups/ bicep curls/ sh. Flexion/ serratus punches 20x each.   Manual tx.:    Supine L/R neural glides (median/ radial/ ulnar nerve) 4x each.  STM to B UT region during UT/levator manual stretches in supine position. Supine cervical traction 5x with static holds.             Pt. able to complete all supine resisted ex. with no increase c/o B UE pain. PT provided cuing for proper technique and guidance for HEP. Pt. remains limited  with progression of upper limb nerve glides on L and R UE due to pain/nerve restrictions. Pt. will continue with daily stretches/ B UE ROM ex. to improve pain-free mobility. Pt. may begin resisted ex. program with 1# or 3# wts.     PT Long Term Goals - 07/20/17 0919      PT LONG TERM GOAL #1   Title  Pt. will improve FOTO score from 52 to 66 to show a MCID for percived function so patient feels confident to return to all ADL.    Baseline  on 2/26: FOTO 52    Time  4    Period  Weeks    Status  New    Target Date  08/16/17      PT LONG TERM GOAL #2   Title  Pt. will improve grip strength by 10 pounds on L side to improve grasping ability with cooking.    Baseline  on 2/26: grip strength R/L   64#/50#    Time  4    Period  Weeks    Status  New    Target Date  08/16/17      PT LONG TERM GOAL #3   Title  Pt. will decrease resting arm pain from 2/10 NPS to 0/10 NPS for 2 weeks to decrease pt. worry of permanent nerve damage.    Baseline  on 2/26: resting arm pain 2/10 NPS    Time  4    Period  Weeks    Status  New    Target Date  08/16/17      PT LONG TERM GOAL #4   Title  Pt. will be able to tolerate wearing tight sleeve and glove with no increased discomfort to demonstarte decreased nerve pian in L UE.    Baseline  on 2/26: pt. has increased discomfort with light touch to L UE    Time  4    Period  Weeks    Target Date  08/16/17        Patient will benefit from skilled therapeutic intervention in order to improve the following deficits and impairments:  Impaired sensation, Decreased strength, Pain, Impaired UE functional use, Impaired perceived functional ability  Visit Diagnosis: Pain in left arm  Muscle weakness (generalized)  Joint stiffness  Abnormal posture     Problem List Patient Active Problem List   Diagnosis Date Noted  . Cervical radiculitis 06/03/2017  . Right shoulder pain 12/05/2014  . Complex regional pain syndrome type 1 of right lower extremity  07/05/2014  . Cervical postlaminectomy syndrome 09/07/2011  . Cervical radiculopathy at C6 09/07/2011   Pura Spice, PT, DPT # [id number] 07/31/2017, 8:38 PM  [facility] [address] [place], [place], [address] Phone: [phone]   Fax:  [phone]  Name: Sabrina Williams MRN: [id number] Date of Birth: [birth year]

## 2017-07-28 ENCOUNTER — Ambulatory Visit: Payer: BLUE CROSS/BLUE SHIELD | Admitting: Physical Therapy

## 2017-07-28 DIAGNOSIS — M79602 Pain in left arm: Secondary | ICD-10-CM

## 2017-07-28 DIAGNOSIS — M2560 Stiffness of unspecified joint, not elsewhere classified: Secondary | ICD-10-CM

## 2017-07-28 DIAGNOSIS — M6281 Muscle weakness (generalized): Secondary | ICD-10-CM

## 2017-07-28 DIAGNOSIS — R293 Abnormal posture: Secondary | ICD-10-CM

## 2017-07-31 ENCOUNTER — Encounter: Payer: Self-pay | Admitting: Physical Therapy
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
## 2017-08-01 ENCOUNTER — Ambulatory Visit: Payer: BLUE CROSS/BLUE SHIELD | Admitting: Physical Therapy

## 2017-08-01 ENCOUNTER — Encounter: Payer: Self-pay | Admitting: Physical Therapy

## 2017-08-01 DIAGNOSIS — M2560 Stiffness of unspecified joint, not elsewhere classified: Secondary | ICD-10-CM

## 2017-08-01 DIAGNOSIS — M79602 Pain in left arm: Secondary | ICD-10-CM

## 2017-08-01 DIAGNOSIS — R293 Abnormal posture: Secondary | ICD-10-CM

## 2017-08-01 DIAGNOSIS — M6281 Muscle weakness (generalized): Secondary | ICD-10-CM

## 2017-08-01 NOTE — Therapy (Signed)
[facility] [address] [place], [place], [address] Phone: [phone]   Fax:  [phone]  Physical Therapy Treatment  Patient Details  Name: Sabrina Williams MRN: [id number] Date of Birth: [birth year] Referring Provider: Eustace Moore, MD   Encounter Date: 08/01/2017    PT End of Session - 07/31/17 2043    Visit Number  4    Number of Visits  8    Date for PT Re-Evaluation  08/16/17    PT Start Time  0263    PT Stop Time  1354    PT Time Calculation (min)  51 min    Activity Tolerance  Patient tolerated treatment well    Behavior During Therapy  [facility] for tasks assessed/performed         Past Medical History:  Diagnosis Date  . Chronic kidney disease    stones  . Depression   . Diabetes mellitus   . Hypertension   . Neuromuscular disorder [facility])     Past Surgical History:  Procedure Laterality Date  . CESAREAN SECTION     x2  . dental implant  July 2015  . ENDOSCOPIC PLANTAR FASCIOTOMY Right August 2015  . LITHOTRIPSY     multiple  . SHOULDER SURGERY  6/06  . SMALL INTESTINE SURGERY     cervical laminectomy  . utereroscopy  1995    There were no vitals filed for this visit.    Pt. states she is doing okay. Increase pain to 3/10 in L shoulder/elbow with resisted tasks.      There.ex.:  Nautilus: 40# lat. Pull downs/ 30# tricep ext./ 30# scap. Retraction/ 20# sh. adduction 10x2 each.  Standing B shoulderAROM (all planes)- reassessment Seated L UE strength: 4+/5 MMT (slight increase pain with resisted abd.).              R UE strength: 4+/5 MMT except biceps 4/5 MMT. See HEP   Manual tx.:   Supine L/R neural glides (median/ radial/ ulnar nerve) 4x each.  Supine L and R UT/levator manual stretches in supine position. Supine cervical traction 5x with static holds. ProneSTM to L UT region: multiple trigger points noted.  Trigger point dry needling with 1 muscle fasciculations noted.  3 needles  used/ good tx. Tolerance.       Pt. remains pain limited/ focused during cervical and UE stretching/ neural glides. B UE AROM WFL and strength grossly 4+/5 MMT with slight increase in pain during resisted sh. abd. Good tx. tolerance with trigger point dry needling to L UT/ posterior deltoid region.    PT Long Term Goals - 07/20/17 0919      PT LONG TERM GOAL #1   Title  Pt. will improve FOTO score from 52 to 66 to show a MCID for percived function so patient feels confident to return to all ADL.    Baseline  on 2/26: FOTO 52    Time  4    Period  Weeks    Status  New    Target Date  08/16/17      PT LONG TERM GOAL #2   Title  Pt. will improve grip strength by 10 pounds on L side to improve grasping ability with cooking.    Baseline  on 2/26: grip strength R/L   64#/50#    Time  4    Period  Weeks    Status  New    Target Date  08/16/17  PT LONG TERM GOAL #3   Title  Pt. will decrease resting arm pain from 2/10 NPS to 0/10 NPS for 2 weeks to decrease pt. worry of permanent nerve damage.    Baseline  on 2/26: resting arm pain 2/10 NPS    Time  4    Period  Weeks    Status  New    Target Date  08/16/17      PT LONG TERM GOAL #4   Title  Pt. will be able to tolerate wearing tight sleeve and glove with no increased discomfort to demonstarte decreased nerve pian in L UE.    Baseline  on 2/26: pt. has increased discomfort with light touch to L UE    Time  4    Period  Weeks    Target Date  08/16/17          Patient will benefit from skilled therapeutic intervention in order to improve the following deficits and impairments:  Impaired sensation, Decreased strength, Pain, Impaired UE functional use, Impaired perceived functional ability  Visit Diagnosis: Pain in left arm  Muscle weakness (generalized)  Joint stiffness  Abnormal posture     Problem List Patient Active Problem List   Diagnosis Date Noted  . Cervical radiculitis 06/03/2017  . Right shoulder  pain 12/05/2014  . Complex regional pain syndrome type 1 of right lower extremity 07/05/2014  . Cervical postlaminectomy syndrome 09/07/2011  . Cervical radiculopathy at C6 09/07/2011   Pura Spice, PT, DPT # [phone] 08/06/2017, 7:34 AM  [facility] [address] [place], [place], [address] Phone: [phone]   Fax:  [phone]  Name: Sabrina Williams MRN: [id number] Date of Birth: [birth year]

## 2017-08-04 ENCOUNTER — Ambulatory Visit: Payer: BLUE CROSS/BLUE SHIELD | Admitting: Physical Therapy

## 2017-08-05 ENCOUNTER — Encounter: Payer: BLUE CROSS/BLUE SHIELD | Attending: Physical Medicine & Rehabilitation | Admitting: Registered Nurse

## 2017-08-05 ENCOUNTER — Encounter: Payer: Self-pay | Admitting: Registered Nurse

## 2017-08-05 VITALS — BP 114/83 | HR 96 | Resp 14

## 2017-08-05 DIAGNOSIS — M5412 Radiculopathy, cervical region: Secondary | ICD-10-CM | POA: Diagnosis not present

## 2017-08-05 DIAGNOSIS — G894 Chronic pain syndrome: Secondary | ICD-10-CM | POA: Diagnosis not present

## 2017-08-05 DIAGNOSIS — M961 Postlaminectomy syndrome, not elsewhere classified: Secondary | ICD-10-CM | POA: Diagnosis not present

## 2017-08-05 DIAGNOSIS — Z79891 Long term (current) use of opiate analgesic: Secondary | ICD-10-CM | POA: Diagnosis not present

## 2017-08-05 DIAGNOSIS — M797 Fibromyalgia: Secondary | ICD-10-CM | POA: Diagnosis not present

## 2017-08-05 DIAGNOSIS — M5010 Cervical disc disorder with radiculopathy, unspecified cervical region: Secondary | ICD-10-CM | POA: Diagnosis not present

## 2017-08-05 DIAGNOSIS — Z5181 Encounter for therapeutic drug level monitoring: Secondary | ICD-10-CM

## 2017-08-05 DIAGNOSIS — G90521 Complex regional pain syndrome I of right lower limb: Secondary | ICD-10-CM

## 2017-08-05 DIAGNOSIS — M7918 Myalgia, other site: Secondary | ICD-10-CM | POA: Diagnosis not present

## 2017-08-05 MED ORDER — TAPENTADOL HCL 100 MG PO TABS: 1.0000 | ORAL_TABLET | Freq: Four times a day (QID) | ORAL | 0 refills | Status: DC | PRN

## 2017-08-05 NOTE — Progress Notes (Signed)
Subjective:    Patient ID: Sabrina Williams, female    DOB: [birth year], 47 y.o.   MRN: [id number]  HPI: [name] is a 47year old female who returns for follow up appointmentfor chronic pain and medication refill. She states her pain is located in her neck radiating into her left shoulder and left elbow with tingling and burning. Also reports the increase in gabapentin has decreased the intensity of neuropathic pain. She rates her pain 2. Her current exercise regime is walking and attending physical therapy twice a week also receiving dry needling. .   [name] Morphine equivalent is 176.00 MME.   S/P Lap Band Surgery on August 10th,2017, she has lost 123lbs she reports.    Last  UDS was Performed on 07/01/2017 it was consistent for prescribed medication.   Pain Inventory Average Pain 3 Pain Right Now 2 My pain is constant, sharp, burning, dull, stabbing, tingling and aching  In the last 24 hours, has pain interfered with the following? General activity 5 Relation with others 1 Enjoyment of life 2 What TIME of day is your pain at its worst? night Sleep (in general) Fair  Pain is worse with: walking, standing and some activites Pain improves with: rest, heat/ice, therapy/exercise and medication Relief from Meds: 7  Mobility walk without assistance how many minutes can you walk? 30-45 do you drive?  yes Do you have any goals in this area?  yes  Function not employed: date last employed . Do you have any goals in this area?  no  Neuro/Psych No problems in this area numbness  Prior Studies Any changes since last visit?  no  Physicians involved in your care Any changes since last visit?  no   Family History  Problem Relation Age of Onset  . Cancer Mother        multiple myeloma  . Diabetes Father   . Kidney disease Father   . Heart disease Father    Social History   Socioeconomic History  . Marital status: Married    Spouse name: None  .  Number of children: None  . Years of education: None  . Highest education level: None  Social Needs  . Financial resource strain: None  . Food insecurity - worry: None  . Food insecurity - inability: None  . Transportation needs - medical: None  . Transportation needs - non-medical: None  Occupational History  . None  Tobacco Use  . Smoking status: Never Smoker  . Smokeless tobacco: Never Used  Substance and Sexual Activity  . Alcohol use: No    Alcohol/week: 0.0 oz  . Drug use: No  . Sexual activity: None  Other Topics Concern  . None  Social History Narrative  . None   Past Surgical History:  Procedure Laterality Date  . CESAREAN SECTION     x2  . dental implant  July 2015  . ENDOSCOPIC PLANTAR FASCIOTOMY Right August 2015  . LITHOTRIPSY     multiple  . SHOULDER SURGERY  6/06  . SMALL INTESTINE SURGERY     cervical laminectomy  . utereroscopy  1995   Past Medical History:  Diagnosis Date  . Chronic kidney disease    stones  . Depression   . Diabetes mellitus   . Hypertension   . Neuromuscular disorder (HCC)    BP 114/83   Pulse 96   Resp 14   SpO2 96%   Opioid Risk Score:  1 Fall Risk Score:  `  1  Depression screen PHQ 2/9  Depression screen [facility] 2/9 07/01/2017 03/11/2017 02/14/2017 12/03/2016 09/30/2016 08/30/2016 07/25/2015  Decreased Interest 0 0 0 0 _0 Down, Depressed, Hopeless 0 0 0 0 _1 PHQ - 2 Score 0 0 0 0 _2 Altered sleeping - - - - - - -  Tired, decreased energy - - - - - - -  Change in appetite - - - - - - -  Feeling bad or failure about yourself  - - - - - - -  Trouble concentrating - - - - - - -  Moving slowly or fidgety/restless - - - - - - -  Suicidal thoughts - - - - - - -  PHQ-9 Score - - - - - - -     Review of Systems  Constitutional: Negative.   HENT: Negative.   Eyes: Negative.   Respiratory: Negative.   Cardiovascular: Negative.   Gastrointestinal: Negative.   Endocrine: Negative.   Genitourinary: Negative.     Musculoskeletal: Positive for arthralgias, back pain and neck pain.  Skin: Negative.   Allergic/Immunologic: Negative.   Neurological: Negative.   Hematological: Negative.   Psychiatric/Behavioral: Negative.   All other systems reviewed and are negative.      Objective:   Physical Exam  Constitutional: She is oriented to person, place, and time. She appears well-developed and well-nourished.  HENT:  Head: Normocephalic and atraumatic.  Neck: Normal range of motion. Neck supple.  Cardiovascular: Normal rate and regular rhythm.  Pulmonary/Chest: Effort normal and breath sounds normal.  Musculoskeletal:  Normal Muscle Bulk and Muscle Testing Reveals: Upper Extremities: Full ROM and Muscle Strength 5/5 Left AC Joint Tenderness Back without spinal tendernessess Lower Extremities: Full ROM and Muscle Strength 5/5 Arises from Table with ease Narrow Based Gait  Neurological: She is alert and oriented to person, place, and time.  Skin: Skin is warm and dry.  Psychiatric: She has a normal mood and affect.  Nursing note and vitals reviewed.         Assessment & Plan:  1.Complex regional pain syndrome right lower extremity postoperative. She's s/p post plantar fascial release. She continue withsensitivity totouch. She has diffuse numbness and tingling. Continue Gabapentin.08/05/2017 Refilled: Nucynta 100 mg one tablet four times a day. #120. We will continue the opioid monitoring program, this consists of regular clinic visits, examinations, urine drug screen, pill counts as well as use of [place] Controlled Substance reporting System. 2.Myofascial pain syndrome:Continue Tizanidine and  ice, heat and exercise regime. 08/05/2017 3. Depression: Continue: Zoloft. 08/05/2017 4. Muscle Spasm: Continue Tizanidine: 08/05/2017 5. Cervical Post Laminectomy: Continue to Monitor. 08/05/2017 6. Cervical Radiculopathy/ Left Arm Pain. Continue Gabapentin. 08/05/2017 7. Peripheral  Neuropathy: Continue Gabapentin: 08/05/2017 8. Left Shoulder Pain: Continue current medication regime and Continue  to Monitor. 08/05/2017  20 minutes of face to face patient care time was spent during this visit. All questions were encouraged and answered.  F/U in 1 month

## 2017-08-08 ENCOUNTER — Encounter: Payer: BLUE CROSS/BLUE SHIELD | Admitting: Physical Therapy

## 2017-08-09 ENCOUNTER — Ambulatory Visit: Payer: BLUE CROSS/BLUE SHIELD | Admitting: Physical Therapy

## 2017-08-09 ENCOUNTER — Telehealth: Payer: Self-pay | Admitting: Physical Medicine & Rehabilitation

## 2017-08-09 ENCOUNTER — Encounter: Payer: Self-pay | Admitting: Physical Therapy

## 2017-08-09 DIAGNOSIS — R293 Abnormal posture: Secondary | ICD-10-CM

## 2017-08-09 DIAGNOSIS — M6281 Muscle weakness (generalized): Secondary | ICD-10-CM

## 2017-08-09 DIAGNOSIS — M2560 Stiffness of unspecified joint, not elsewhere classified: Secondary | ICD-10-CM

## 2017-08-09 DIAGNOSIS — M79602 Pain in left arm: Secondary | ICD-10-CM | POA: Diagnosis not present

## 2017-08-09 NOTE — Telephone Encounter (Signed)
Patient called to let us now that the pharmacy is telling her that she needs a prior authorization on Nucynta now.  Please call patient if there are any issues.  She will be out of this medication at the end of the week.

## 2017-08-09 NOTE — Therapy (Signed)
[facility] [address] [place], [place], [address] Phone: [phone]   Fax:  [phone]  Physical Therapy Treatment  Patient Details  Name: LUTIE PICKLER MRN: [id number] Date of Birth: [birth year] Referring Provider: Eustace Moore, MD   Encounter Date: 08/09/2017    Treatment 6 of 8.  Recert date: 06/25/52   Past Medical History:  Diagnosis Date  . Chronic kidney disease    stones  . Depression   . Diabetes mellitus   . Hypertension   . Neuromuscular disorder [facility])     Past Surgical History:  Procedure Laterality Date  . CESAREAN SECTION     x2  . dental implant  July 2015  . ENDOSCOPIC PLANTAR FASCIOTOMY Right August 2015  . LITHOTRIPSY     multiple  . SHOULDER SURGERY  6/06  . SMALL INTESTINE SURGERY     cervical laminectomy  . utereroscopy  1995    There were no vitals filed for this visit.     Pt. reports L scapular region hurting. Pt. reports 3/10 pain currently.       There.ex.:  Nautilus: 40# lat. Pull downs/ 30# tricep ext./ 30# scap. Retraction/ 20# sh. adduction 10x2 each.  Supine: 2# press-ups/ horizontal abd./ adduction and serratus punches 10x2.    Manual tx.:   Supine L/R neural glides (median/ radial/ ulnar nerve) 4x each. Supine L and RUT/levator manual stretches in supine position. Supine cervical traction 5x with static holds. ProneSTM to L UT region and posterior deltoid region: multiple trigger points noted. Trigger point dry needling with 2 muscle fasciculations noted. 4 needles used/ good tx. Tolerance.         L posterior deltoid/ UT tenderness with mulitiple small trigger points noted. PT able to reproduce pts. pain symptoms with manual STM. Pt. benefits from trigger point dry needling after manual stretching to decrease/ manage pts. pain symptoms.     PT Long Term Goals - 07/20/17 0919      PT LONG TERM GOAL #1   Title  Pt. will improve FOTO score  from 52 to 66 to show a MCID for percived function so patient feels confident to return to all ADL.    Baseline  on 2/26: FOTO 52    Time  4    Period  Weeks    Status  New    Target Date  08/16/17      PT LONG TERM GOAL #2   Title  Pt. will improve grip strength by 10 pounds on L side to improve grasping ability with cooking.    Baseline  on 2/26: grip strength R/L   64#/50#    Time  4    Period  Weeks    Status  New    Target Date  08/16/17      PT LONG TERM GOAL #3   Title  Pt. will decrease resting arm pain from 2/10 NPS to 0/10 NPS for 2 weeks to decrease pt. worry of permanent nerve damage.    Baseline  on 2/26: resting arm pain 2/10 NPS    Time  4    Period  Weeks    Status  New    Target Date  08/16/17      PT LONG TERM GOAL #4   Title  Pt. will be able to tolerate wearing tight sleeve and glove with no increased discomfort to demonstarte decreased nerve pian in L UE.    Baseline  on  2/26: pt. has increased discomfort with light touch to L UE    Time  4    Period  Weeks    Target Date  08/16/17              Patient will benefit from skilled therapeutic intervention in order to improve the following deficits and impairments:  Impaired sensation, Decreased strength, Pain, Impaired UE functional use, Impaired perceived functional ability  Visit Diagnosis: Pain in left arm  Muscle weakness (generalized)  Joint stiffness  Abnormal posture     Problem List Patient Active Problem List   Diagnosis Date Noted  . Cervical radiculitis 06/03/2017  . Right shoulder pain 12/05/2014  . Complex regional pain syndrome type 1 of right lower extremity 07/05/2014  . Cervical postlaminectomy syndrome 09/07/2011  . Cervical radiculopathy at C6 09/07/2011   Pura Spice, PT, DPT # [phone] 08/14/2017, 6:27 PM  [place] [facility] [address] [place], [place], [address] Phone: [phone]   Fax:  [phone]  Name: SABA GOMM MRN: [id number] Date of Birth: [birth year]

## 2017-08-10 ENCOUNTER — Telehealth: Payer: Self-pay | Admitting: Physical Medicine & Rehabilitation

## 2017-08-10 NOTE — Telephone Encounter (Signed)
Nucynta is the drug that was approved through [facility]

## 2017-08-10 NOTE — Telephone Encounter (Signed)
Nucynta is approved. Patients deductible is exceptionally high. Patient asked if we could do a tier exception.  I contacted the pharmacy administrator.  They said a tier exception is not an option. Patient understood. She has printed a discount card hoping for a reduction in cost

## 2017-08-10 NOTE — Telephone Encounter (Signed)
Mucuna has been approved per [facility].

## 2017-08-11 ENCOUNTER — Encounter: Payer: Self-pay | Admitting: Physical Therapy

## 2017-08-11 ENCOUNTER — Ambulatory Visit: Payer: BLUE CROSS/BLUE SHIELD | Admitting: Physical Therapy

## 2017-08-11 DIAGNOSIS — M79602 Pain in left arm: Secondary | ICD-10-CM | POA: Diagnosis not present

## 2017-08-11 DIAGNOSIS — M6281 Muscle weakness (generalized): Secondary | ICD-10-CM

## 2017-08-11 DIAGNOSIS — R293 Abnormal posture: Secondary | ICD-10-CM

## 2017-08-11 DIAGNOSIS — M2560 Stiffness of unspecified joint, not elsewhere classified: Secondary | ICD-10-CM

## 2017-08-11 NOTE — Telephone Encounter (Signed)
Prior auth completed and approved.  Patients deductible is extremely high. Cost very expensive.  Patient understands. Advised to download commercial pay discount card.  Patient verbally understood and will attempt with discount card.

## 2017-08-15 ENCOUNTER — Encounter: Payer: Self-pay | Admitting: Physical Therapy

## 2017-08-15 ENCOUNTER — Ambulatory Visit: Payer: BLUE CROSS/BLUE SHIELD | Admitting: Physical Therapy

## 2017-08-15 DIAGNOSIS — R293 Abnormal posture: Secondary | ICD-10-CM

## 2017-08-15 DIAGNOSIS — M6281 Muscle weakness (generalized): Secondary | ICD-10-CM

## 2017-08-15 DIAGNOSIS — M79602 Pain in left arm: Secondary | ICD-10-CM

## 2017-08-15 DIAGNOSIS — M2560 Stiffness of unspecified joint, not elsewhere classified: Secondary | ICD-10-CM
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
## 2017-08-15 NOTE — Therapy (Signed)
[place] [facility] [address] [place], [place], [address] Phone: [phone]   Fax:  [phone]  Physical Therapy Treatment  Patient Details  Name: Sabrina Williams MRN: [id number] Date of Birth: 02/24/1971 Referring Provider: Eustace Moore, MD   Encounter Date: 08/11/2017  PT End of Session - 08/15/17 1408    Visit Number  7    Number of Visits  8    Date for PT Re-Evaluation  08/16/17    PT Start Time  1302    PT Stop Time  1352    PT Time Calculation (min)  50 min    Activity Tolerance  Patient tolerated treatment well    Behavior During Therapy  [facility] for tasks assessed/performed       Past Medical History:  Diagnosis Date  . Chronic kidney disease    stones  . Depression   . Diabetes mellitus   . Hypertension   . Neuromuscular disorder [facility])     Past Surgical History:  Procedure Laterality Date  . CESAREAN SECTION     x2  . dental implant  July 2015  . ENDOSCOPIC PLANTAR FASCIOTOMY Right August 2015  . LITHOTRIPSY     multiple  . SHOULDER SURGERY  6/06  . SMALL INTESTINE SURGERY     cervical laminectomy  . utereroscopy  1995    There were no vitals filed for this visit.    Pt. reports increase L knee pain 6/10 currently (since yesterday) and 3/10 L arm pain. Pt. has f/u with Dr. Ronnald Ramp next Monday at 3:15PM.       There.ex.:  Reviewed HEP/ cervical stretches (UT/levator/scallene).   Seated thoracic extension 5x.     Manual tx.:   Supine L/R neural glides (median/ radial/ ulnar nerve) 4x each. Supine L and RUT/levator manual stretches in supine position. Supine cervical traction 5x with static holds. ProneSTM to L UT region and posterior deltoid region: multiple trigger points noted. Trigger point dry needling withseveralmuscle fasciculations noted. 4needles used/ good tx. Tolerance.   No modalities today.        No resisted ex. today. PT focused on posture correction and manual  stretches to decrease c/o pain in B UT region/ L arm. Pt. reports continued benefit from consistent stretching program and dry needling to manage pain symptoms. Good cervical AROM all planes. Generalized soft tissue tenderness in upper thoracic/low cervical region during reassessment of trigger points.      PT Long Term Goals - 07/20/17 0919      PT LONG TERM GOAL #1   Title  Pt. will improve FOTO score from 52 to 66 to show a MCID for percived function so patient feels confident to return to all ADL.    Baseline  on 2/26: FOTO 52    Time  4    Period  Weeks    Status  New    Target Date  08/16/17      PT LONG TERM GOAL #2   Title  Pt. will improve grip strength by 10 pounds on L side to improve grasping ability with cooking.    Baseline  on 2/26: grip strength R/L   64#/50#    Time  4    Period  Weeks    Status  New    Target Date  08/16/17      PT LONG TERM GOAL #3   Title  Pt. will decrease resting arm pain from 2/10 NPS to 0/10 NPS  for 2 weeks to decrease pt. worry of permanent nerve damage.    Baseline  on 2/26: resting arm pain 2/10 NPS    Time  4    Period  Weeks    Status  New    Target Date  08/16/17      PT LONG TERM GOAL #4   Title  Pt. will be able to tolerate wearing tight sleeve and glove with no increased discomfort to demonstarte decreased nerve pian in L UE.    Baseline  on 2/26: pt. has increased discomfort with light touch to L UE    Time  4    Period  Weeks    Target Date  08/16/17            Plan - 08/15/17 1409    Clinical Impression Statement  No resisted ex. today.  PT focused on posture correction and manual stretches to decrease c/o pain in B UT region/ L arm.  Pt. reports continued benefit from consistent stretching program and dry needling to manage pain symptoms.  Good cervical AROM all planes.  Generalized soft tissue tenderness in upper thoracic/low cervical region during reassessment of trigger points.        Clinical Presentation   Evolving    Clinical Decision Making  Moderate    Rehab Potential  Fair    Clinical Impairments Affecting Rehab Potential  chronic arm and nerve pain, prior cervical fusion    PT Frequency  2x / week    PT Duration  4 weeks    PT Treatment/Interventions  Biofeedback;Aquatic Therapy;Electrical Stimulation;Moist Heat;Traction;Contrast Bath;Therapeutic exercise;Neuromuscular re-education;Patient/family education;Manual techniques;Passive range of motion;Dry needling    PT Next Visit Plan  progress pain-free funcitonal tasks/ trigger point dry needling (reassess R UT region/ L infraspinatus).  [place] Exercise Plan  see handout    Consulted and Agree with Plan of Care  Patient       Patient will benefit from skilled therapeutic intervention in order to improve the following deficits and impairments:  Impaired sensation, Decreased strength, Pain, Impaired UE functional use, Impaired perceived functional ability  Visit Diagnosis: Pain in left arm  Muscle weakness (generalized)  Joint stiffness  Abnormal posture     Problem List Patient Active Problem List   Diagnosis Date Noted  . Cervical radiculitis 06/03/2017  . Right shoulder pain 12/05/2014  . Complex regional pain syndrome type 1 of right lower extremity 07/05/2014  . Cervical postlaminectomy syndrome 09/07/2011  . Cervical radiculopathy at C6 09/07/2011   Pura Spice, PT, DPT # [phone] 08/15/2017, 2:48 PM  [place] [address] [place], [place], [address] Phone: [phone]   Fax:  [phone]  Name: SHADIE SWEATMAN MRN: [id number] Date of Birth: 1971-02-15

## 2017-08-17 DIAGNOSIS — F419 Anxiety disorder, unspecified: Secondary | ICD-10-CM | POA: Insufficient documentation

## 2017-08-18 ENCOUNTER — Encounter: Payer: BLUE CROSS/BLUE SHIELD | Admitting: Physical Therapy

## 2017-08-22 ENCOUNTER — Ambulatory Visit: Payer: BLUE CROSS/BLUE SHIELD | Attending: Neurological Surgery | Admitting: Physical Therapy

## 2017-08-22 DIAGNOSIS — M6281 Muscle weakness (generalized): Secondary | ICD-10-CM | POA: Insufficient documentation

## 2017-08-22 DIAGNOSIS — M2560 Stiffness of unspecified joint, not elsewhere classified: Secondary | ICD-10-CM | POA: Insufficient documentation

## 2017-08-22 DIAGNOSIS — M79602 Pain in left arm: Secondary | ICD-10-CM | POA: Insufficient documentation

## 2017-08-22 DIAGNOSIS — R293 Abnormal posture: Secondary | ICD-10-CM | POA: Insufficient documentation

## 2017-08-22 NOTE — Therapy (Signed)
[place] [facility] [address] [place], [place], [address] Phone: [phone]   Fax:  [phone]  Physical Therapy Treatment  Patient Details  Name: Sabrina Williams MRN: [id number] Date of Birth: November 01, 1970 Referring Provider: Eustace Moore, MD   Encounter Date: 08/22/2017  PT End of Session - 08/28/17 1606    Visit Number  9    Number of Visits  13    Date for PT Re-Evaluation  09/19/17    PT Start Time  1257    PT Stop Time  1349    PT Time Calculation (min)  52 min    Activity Tolerance  Patient tolerated treatment well    Behavior During Therapy  [facility] for tasks assessed/performed       Past Medical History:  Diagnosis Date  . Chronic kidney disease    stones  . Depression   . Diabetes mellitus   . Hypertension   . Neuromuscular disorder [facility])     Past Surgical History:  Procedure Laterality Date  . CESAREAN SECTION     x2  . dental implant  July 2015  . ENDOSCOPIC PLANTAR FASCIOTOMY Right August 2015  . LITHOTRIPSY     multiple  . SHOULDER SURGERY  6/06  . SMALL INTESTINE SURGERY     cervical laminectomy  . utereroscopy  1995    There were no vitals filed for this visit.    Pt. reports pain in L wrist/pinky finger. Pt. did some weeding/ digging in flower beds this past weekend. Pt. reports increase discomfort and stiffness in B UT/ UE.         There.ex.:  Nustep:  Nautilus:40# lat. Pull downs/30#tricep ext./30# sh. adduction/ 30#scap. Retraction/ 20# chest press (moderate UE fatigue noted)10x2 each.  Supine B shoulder flexion/ horizontal abd./ adduction stretches and AROM (tolerable end-range).    Manual tx.:   Supine cervical stretches/ STM to posterior shoulder/ cervical region. ProneSTM to L UT region: multiple trigger points noted. Trigger point dry needling with56mscle fasciculations noted. 4needles used/ good tx. Tolerance.  MH to back (varying positions during STM/ needling).         Pt. has not [facility] [facility] this time but is planning too soon. Pt. has shown slow but consistent improvement in B UE strengthening and pain management with functional tasks. Pt. has benefitted from cervical/ L UE manual stretches to increase functional mobility and progression to more strengthening ex. Grip strength: R 73.5#/ L 57#. FOTO: 63 (improvement noted). Pt. will benefit from continued skilled PT services 1x/week to focus on manul tx./ trigger point dry needling with pt. focsuing on strengthening/ ther.ex. at [employer] a more independent basis.     PT Long Term Goals - 08/22/17 1314      PT LONG TERM GOAL #1   Title  Pt. will improve FOTO score from 52 to 66 to show a MCID for percived function so patient feels confident to return to all ADL.    Baseline  on 2/26: FOTO 52.  On 4/1: 63    Time  4    Period  Weeks    Status  Partially Met    Target Date  09/19/17      PT LONG TERM GOAL #2   Title  Pt. will improve grip strength by 10 pounds on L side to improve grasping ability with cooking.    Baseline  on 2/26: grip strength R/L   64#/50#.  On 4/1: R (73#/ L 57#)- marked improvement    Time  4    Period  Weeks    Status  Partially Met    Target Date  09/19/17      PT LONG TERM GOAL #3   Title  Pt. will decrease resting arm pain from 2/10 NPS to 0/10 NPS for 2 weeks to decrease pt. worry of permanent nerve damage.    Baseline  on 2/26: resting arm pain 2/10 NPS.  Pain continues to fluctuate depending on activity.     Time  4    Period  Weeks    Status  Partially Met    Target Date  09/19/17      PT LONG TERM GOAL #4   Title  Pt. will be able to tolerate wearing tight sleeve and glove with no increased discomfort to demonstarte decreased nerve pian in L UE.    Baseline  on 2/26: pt. has increased discomfort with light touch to L UE    Time  4    Period  Weeks    Status  On-going    Target Date  09/19/17               Patient will benefit from skilled therapeutic intervention in order to improve the following deficits and impairments:  Impaired sensation, Decreased strength, Pain, Impaired UE functional use, Impaired perceived functional ability  Visit Diagnosis: Pain in left arm  Muscle weakness (generalized)  Joint stiffness  Abnormal posture     Problem List Patient Active Problem List   Diagnosis Date Noted  . Cervical radiculitis 06/03/2017  . Right shoulder pain 12/05/2014  . Complex regional pain syndrome type 1 of right lower extremity 07/05/2014  . Cervical postlaminectomy syndrome 09/07/2011  . Cervical radiculopathy at C6 09/07/2011   Pura Spice, PT, DPT # [phone] 08/28/2017, 4:17 PM  [facility] [address] [place], [place], [address] Phone: [phone]   Fax:  [phone]  Name: Sabrina Williams MRN: [id number] Date of Birth: [birth year]

## 2017-08-30 ENCOUNTER — Ambulatory Visit: Payer: BLUE CROSS/BLUE SHIELD | Admitting: Physical Therapy

## 2017-08-30 DIAGNOSIS — M79602 Pain in left arm: Secondary | ICD-10-CM

## 2017-08-30 DIAGNOSIS — M2560 Stiffness of unspecified joint, not elsewhere classified: Secondary | ICD-10-CM

## 2017-08-30 DIAGNOSIS — R293 Abnormal posture: Secondary | ICD-10-CM

## 2017-08-30 DIAGNOSIS — M6281 Muscle weakness (generalized): Secondary | ICD-10-CM

## 2017-09-02 ENCOUNTER — Encounter: Payer: BLUE CROSS/BLUE SHIELD | Attending: Physical Medicine & Rehabilitation | Admitting: Registered Nurse

## 2017-09-02 ENCOUNTER — Encounter: Payer: Self-pay | Admitting: Registered Nurse

## 2017-09-02 VITALS — BP 114/81 | HR 86 | Resp 14 | Ht 68.0 in | Wt 249.0 lb

## 2017-09-02 DIAGNOSIS — M961 Postlaminectomy syndrome, not elsewhere classified: Secondary | ICD-10-CM | POA: Diagnosis not present

## 2017-09-02 DIAGNOSIS — M7918 Myalgia, other site: Secondary | ICD-10-CM

## 2017-09-02 DIAGNOSIS — G90521 Complex regional pain syndrome I of right lower limb: Secondary | ICD-10-CM | POA: Insufficient documentation

## 2017-09-02 DIAGNOSIS — Z5181 Encounter for therapeutic drug level monitoring: Secondary | ICD-10-CM | POA: Diagnosis not present

## 2017-09-02 DIAGNOSIS — Z79891 Long term (current) use of opiate analgesic: Secondary | ICD-10-CM | POA: Diagnosis not present

## 2017-09-02 DIAGNOSIS — M5412 Radiculopathy, cervical region: Secondary | ICD-10-CM | POA: Diagnosis not present

## 2017-09-02 DIAGNOSIS — M797 Fibromyalgia: Secondary | ICD-10-CM | POA: Insufficient documentation

## 2017-09-02 DIAGNOSIS — G894 Chronic pain syndrome: Secondary | ICD-10-CM

## 2017-09-02 DIAGNOSIS — M5010 Cervical disc disorder with radiculopathy, unspecified cervical region: Secondary | ICD-10-CM | POA: Diagnosis not present

## 2017-09-02 MED ORDER — TAPENTADOL HCL 100 MG PO TABS: 1.0000 | ORAL_TABLET | Freq: Four times a day (QID) | ORAL | 0 refills | Status: DC | PRN

## 2017-09-02 NOTE — Progress Notes (Signed)
Subjective:    Patient ID: Sabrina Williams, female    DOB: 10-Mar-1971, 47 y.o.   MRN: [id number]  HPI: [name] is a 47 year old female who returns for follow up appointment for chronic pain and medication refill. She states her pain is located in her neck radiating into her left arm with tingling and numbness. She rates her pain 3. Her current exercise regime is walking, attending physical therapy weekly, lifting light weights 2-3 times a week, She's receiving dry needling with good relief noted she reports,   [name] Morphine Equivalent is 165.33  Last UDS was Performed on 07/01/2017 it was consistent for prescribe medications. ETOH was noted and warning letter mailed.    S/P Lap Band Surgery on August 10th,2017, she has lost 123lbs she reports.     Pain Inventory Average Pain 4 Pain Right Now 3 My pain is constant, sharp, burning, dull, stabbing, tingling and aching  In the last 24 hours, has pain interfered with the following? General activity 5 Relation with others 2 Enjoyment of life 5 What TIME of day is your pain at its worst? night Sleep (in general) Fair  Pain is worse with: walking, standing and some activites Pain improves with: rest, heat/ice, therapy/exercise and medication Relief from Meds: 7  Mobility walk without assistance how many minutes can you walk? 45 ability to climb steps?  yes do you drive?  yes Do you have any goals in this area?  yes  Function not employed: date last employed . I need assistance with the following:  meal prep, household duties and shopping Do you have any goals in this area?  no  Neuro/Psych numbness tingling spasms  Prior Studies Any changes since last visit?  no  Physicians involved in your care Any changes since last visit?  no   Family History  Problem Relation Age of Onset  . Cancer Mother        multiple myeloma  . Diabetes Father   . Kidney disease Father   . Heart disease Father     Social History   Socioeconomic History  . Marital status: Married    Spouse name: Not on file  . Number of children: Not on file  . Years of education: Not on file  . Highest education level: Not on file  Occupational History  . Not on file  Social Needs  . Financial resource strain: Not on file  . Food insecurity:    Worry: Not on file    Inability: Not on file  . Transportation needs:    Medical: Not on file    Non-medical: Not on file  Tobacco Use  . Smoking status: Never Smoker  . Smokeless tobacco: Never Used  Substance and Sexual Activity  . Alcohol use: No    Alcohol/week: 0.0 oz  . Drug use: No  . Sexual activity: Not on file  Lifestyle  . Physical activity:    Days per week: Not on file    Minutes per session: Not on file  . Stress: Not on file  Relationships  . Social connections:    Talks on phone: Not on file    Gets together: Not on file    Attends religious service: Not on file    Active member of club or organization: Not on file    Attends meetings of clubs or organizations: Not on file    Relationship status: Not on file  Other Topics Concern  . Not  on file  Social History Narrative  . Not on file   Past Surgical History:  Procedure Laterality Date  . CESAREAN SECTION     x2  . dental implant  July 2015  . ENDOSCOPIC PLANTAR FASCIOTOMY Right August 2015  . LITHOTRIPSY     multiple  . SHOULDER SURGERY  6/06  . SMALL INTESTINE SURGERY     cervical laminectomy  . utereroscopy  1995   Past Medical History:  Diagnosis Date  . Chronic kidney disease    stones  . Depression   . Diabetes mellitus   . Hypertension   . Neuromuscular disorder (HCC)    BP 114/81 (BP Location: Left Arm, Patient Position: Sitting, Cuff Size: Large)   Pulse 86   Resp 14   Ht '5\' 8"'$  (1.727 m)   Wt 249 lb (112.9 kg)   SpO2 95%   BMI 37.86 kg/m   Opioid Risk Score:   Fall Risk Score:  `1  Depression screen PHQ 2/9  Depression screen [facility] 2/9 07/01/2017  03/11/2017 02/14/2017 12/03/2016 09/30/2016 08/30/2016 07/25/2015  Decreased Interest 0 0 0 0 '1 1 1  '$ Down, Depressed, Hopeless 0 0 0 0 '1 1 1  '$ PHQ - 2 Score 0 0 0 0 '2 2 2  '$ Altered sleeping - - - - - - -  Tired, decreased energy - - - - - - -  Change in appetite - - - - - - -  Feeling bad or failure about yourself  - - - - - - -  Trouble concentrating - - - - - - -  Moving slowly or fidgety/restless - - - - - - -  Suicidal thoughts - - - - - - -  PHQ-9 Score - - - - - - -    Review of Systems  Constitutional: Negative.   Eyes: Negative.   Respiratory: Negative.   Cardiovascular: Negative.   Gastrointestinal: Negative.   Endocrine: Negative.   Genitourinary: Negative.   Musculoskeletal: Positive for arthralgias and myalgias.       Spasms  Skin: Negative.   Allergic/Immunologic: Negative.   Neurological: Positive for numbness.       Tingling  Psychiatric/Behavioral: Negative.        Objective:   Physical Exam  Constitutional: She is oriented to person, place, and time. She appears well-developed and well-nourished.  HENT:  Head: Normocephalic and atraumatic.  Neck: Normal range of motion. Neck supple.  Cardiovascular: Normal rate and regular rhythm.  Pulmonary/Chest: Effort normal and breath sounds normal.  Musculoskeletal:  Normal Muscle Bulk and Muscle Testing Reveals: Upper Extremities: Full ROM and Muscle Strength 5/5 Back without spinal tenderness noted. Lower Extremities: Full ROM and Muscle Strength 5/5 Arises from Table with ease Narrow Based Gait   Neurological: She is alert and oriented to person, place, and time.  Skin: Skin is warm and dry.  Psychiatric: She has a normal mood and affect.  Nursing note and vitals reviewed.         Assessment & Plan:  1.Complex regional pain syndrome right lower extremity postoperative. She's s/p post plantar fascial release. She continue withsensitivity totouch. She has diffuse numbness and tingling. Continue current  medication regime with  Gabapentin.09/02/2017 Refilled: Nucynta 100 mg one tablet four times a day. #120. We will continue the opioid monitoring program, this consists of regular clinic visits, examinations, urine drug screen, pill counts as well as use of [place] Controlled Substance reporting System. 2.Myofascial pain syndrome:Continue current medication regime  with Tizanidine and  ice, heat and exercise regime. 09/02/2017 3. Depression: Continue: Zoloft. 09/02/2017 4. Cervical Post Laminectomy: Continue to Monitor. 09/02/2017 6. Cervical Radiculopathy/ Left Arm Pain. Continue current medication regime with  Gabapentin. 09/02/2017 7. Peripheral Neuropathy: Continue current medication regime with Gabapentin: 09/02/2017 8. Left Shoulder Pain: Continue current medication regime and Continue  to Monitor. 09/02/2017  20 minutes of face to face patient care time was spent during this visit. All questions were encouraged and answered.  F/U in 1 month
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
## 2017-09-06 ENCOUNTER — Ambulatory Visit: Payer: BLUE CROSS/BLUE SHIELD | Admitting: Physical Therapy

## 2017-09-06 ENCOUNTER — Encounter: Payer: Self-pay | Admitting: Physical Therapy

## 2017-09-06 DIAGNOSIS — R293 Abnormal posture: Secondary | ICD-10-CM

## 2017-09-06 DIAGNOSIS — M79602 Pain in left arm: Secondary | ICD-10-CM | POA: Diagnosis not present

## 2017-09-06 DIAGNOSIS — M6281 Muscle weakness (generalized): Secondary | ICD-10-CM

## 2017-09-06 DIAGNOSIS — M2560 Stiffness of unspecified joint, not elsewhere classified: Secondary | ICD-10-CM
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
## 2017-09-12 ENCOUNTER — Ambulatory Visit: Payer: BLUE CROSS/BLUE SHIELD | Admitting: Physical Therapy

## 2017-09-12 ENCOUNTER — Encounter: Payer: Self-pay | Admitting: Physical Therapy

## 2017-09-12 DIAGNOSIS — M79602 Pain in left arm: Secondary | ICD-10-CM | POA: Diagnosis not present

## 2017-09-12 DIAGNOSIS — R293 Abnormal posture: Secondary | ICD-10-CM

## 2017-09-12 DIAGNOSIS — M2560 Stiffness of unspecified joint, not elsewhere classified: Secondary | ICD-10-CM

## 2017-09-12 DIAGNOSIS — M6281 Muscle weakness (generalized): Secondary | ICD-10-CM
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
## 2017-09-19 ENCOUNTER — Ambulatory Visit: Payer: BLUE CROSS/BLUE SHIELD | Admitting: Physical Therapy

## 2017-09-19 ENCOUNTER — Encounter: Payer: Self-pay | Admitting: Physical Therapy

## 2017-09-19 DIAGNOSIS — M79602 Pain in left arm: Secondary | ICD-10-CM

## 2017-09-19 DIAGNOSIS — M2560 Stiffness of unspecified joint, not elsewhere classified: Secondary | ICD-10-CM

## 2017-09-19 DIAGNOSIS — M6281 Muscle weakness (generalized): Secondary | ICD-10-CM

## 2017-09-19 DIAGNOSIS — R293 Abnormal posture: Secondary | ICD-10-CM
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
## 2017-09-24 ENCOUNTER — Other Ambulatory Visit: Payer: Self-pay | Admitting: Physical Medicine & Rehabilitation

## 2017-09-30 ENCOUNTER — Encounter: Payer: Self-pay | Admitting: Registered Nurse

## 2017-09-30 ENCOUNTER — Encounter: Payer: BLUE CROSS/BLUE SHIELD | Attending: Physical Medicine & Rehabilitation | Admitting: Registered Nurse

## 2017-09-30 VITALS — BP 110/77 | HR 73 | Resp 14 | Ht 68.0 in | Wt 252.0 lb

## 2017-09-30 DIAGNOSIS — M797 Fibromyalgia: Secondary | ICD-10-CM | POA: Diagnosis not present

## 2017-09-30 DIAGNOSIS — Z79891 Long term (current) use of opiate analgesic: Secondary | ICD-10-CM | POA: Diagnosis not present

## 2017-09-30 DIAGNOSIS — M5412 Radiculopathy, cervical region: Secondary | ICD-10-CM | POA: Insufficient documentation

## 2017-09-30 DIAGNOSIS — M7918 Myalgia, other site: Secondary | ICD-10-CM

## 2017-09-30 DIAGNOSIS — M961 Postlaminectomy syndrome, not elsewhere classified: Secondary | ICD-10-CM

## 2017-09-30 DIAGNOSIS — G90521 Complex regional pain syndrome I of right lower limb: Secondary | ICD-10-CM

## 2017-09-30 DIAGNOSIS — M5010 Cervical disc disorder with radiculopathy, unspecified cervical region: Secondary | ICD-10-CM | POA: Diagnosis not present

## 2017-09-30 DIAGNOSIS — G894 Chronic pain syndrome: Secondary | ICD-10-CM | POA: Diagnosis not present

## 2017-09-30 DIAGNOSIS — Z5181 Encounter for therapeutic drug level monitoring: Secondary | ICD-10-CM | POA: Diagnosis not present

## 2017-09-30 MED ORDER — TAPENTADOL HCL 100 MG PO TABS: 1.0000 | ORAL_TABLET | Freq: Four times a day (QID) | ORAL | 0 refills | Status: DC | PRN

## 2017-09-30 NOTE — Progress Notes (Signed)
Subjective:    Patient ID: Sabrina Williams, female    DOB: April 01, 1971, 47 y.o.   MRN: [id number]  HPI: Sabrina Williams is a 47 year old female who returns for follow up appointment for chronic pain and medication refill. She states her pain is located in her bilateral shoulders L>R radiating into her left arm with tingling and burning and right foot pain. She rates her pain 2. Her current exercise regimen is attending [employer] 3 days a week and walking.   [name] Morphine Equivalent is 154.67 MME.  Last UDS was Performed on 07/01/2017, consistent for prescribed medication. See Note for details.   Pain Inventory Average Pain 3 Pain Right Now 2 My pain is constant, sharp, burning, dull, stabbing, tingling and aching  In the last 24 hours, has pain interfered with the following? General activity 2 Relation with others 2 Enjoyment of life 2 What TIME of day is your pain at its worst? evening Sleep (in general) Fair  Pain is worse with: walking, standing and some activites Pain improves with: rest, therapy/exercise and medication Relief from Meds: 7  Mobility walk without assistance how many minutes can you walk? 30-40 ability to climb steps?  yes do you drive?  yes Do you have any goals in this area?  yes  Function not employed: date last employed 30-40 I need assistance with the following:  meal prep, household duties and shopping Do you have any goals in this area?  yes  Neuro/Psych No problems in this area  Prior Studies Any changes since last visit?  no  Physicians involved in your care Any changes since last visit?  no   Family History  Problem Relation Age of Onset  . Cancer Mother        multiple myeloma  . Diabetes Father   . Kidney disease Father   . Heart disease Father    Social History   Socioeconomic History  . Marital status: Married    Spouse name: Not on file  . Number of children: Not on file  . Years of education: Not on  file  . Highest education level: Not on file  Occupational History  . Not on file  Social Needs  . Financial resource strain: Not on file  . Food insecurity:    Worry: Not on file    Inability: Not on file  . Transportation needs:    Medical: Not on file    Non-medical: Not on file  Tobacco Use  . Smoking status: Never Smoker  . Smokeless tobacco: Never Used  Substance and Sexual Activity  . Alcohol use: No    Alcohol/week: 0.0 oz  . Drug use: No  . Sexual activity: Not on file  Lifestyle  . Physical activity:    Days per week: Not on file    Minutes per session: Not on file  . Stress: Not on file  Relationships  . Social connections:    Talks on phone: Not on file    Gets together: Not on file    Attends religious service: Not on file    Active member of club or organization: Not on file    Attends meetings of clubs or organizations: Not on file    Relationship status: Not on file  Other Topics Concern  . Not on file  Social History Narrative  . Not on file   Past Surgical History:  Procedure Laterality Date  . CESAREAN SECTION  x2  . dental implant  July 2015  . ENDOSCOPIC PLANTAR FASCIOTOMY Right August 2015  . LITHOTRIPSY     multiple  . SHOULDER SURGERY  6/06  . SMALL INTESTINE SURGERY     cervical laminectomy  . utereroscopy  1995   Past Medical History:  Diagnosis Date  . Chronic kidney disease    stones  . Depression   . Diabetes mellitus   . Hypertension   . Neuromuscular disorder (HCC)    BP 110/77 (BP Location: Left Arm, Patient Position: Sitting, Cuff Size: Normal)   Pulse 73   Resp 14   Ht '5\' 8"'$  (1.727 m)   Wt 252 lb (114.3 kg)   SpO2 97%   BMI 38.32 kg/m   Opioid Risk Score:   Fall Risk Score:  `1  Depression screen PHQ 2/9  Depression screen [facility] 2/9 07/01/2017 03/11/2017 02/14/2017 12/03/2016 09/30/2016 08/30/2016 07/25/2015  Decreased Interest 0 0 0 0 '1 1 1  '$ Down, Depressed, Hopeless 0 0 0 0 '1 1 1  '$ PHQ - 2 Score 0 0 0 0 '2 2 2    '$ Altered sleeping - - - - - - -  Tired, decreased energy - - - - - - -  Change in appetite - - - - - - -  Feeling bad or failure about yourself  - - - - - - -  Trouble concentrating - - - - - - -  Moving slowly or fidgety/restless - - - - - - -  Suicidal thoughts - - - - - - -  PHQ-9 Score - - - - - - -    Review of Systems  Constitutional: Negative.   HENT: Negative.   Eyes: Negative.   Respiratory: Negative.   Cardiovascular: Negative.   Gastrointestinal: Negative.   Endocrine: Negative.   Genitourinary: Negative.   Musculoskeletal: Positive for arthralgias and neck pain.  Skin: Negative.   Allergic/Immunologic: Negative.   Hematological: Negative.   Psychiatric/Behavioral: Negative.        Objective:   Physical Exam  Constitutional: She is oriented to person, place, and time. She appears well-developed and well-nourished.  HENT:  Head: Normocephalic and atraumatic.  Neck: Normal range of motion. Neck supple.  Cardiovascular: Normal rate and regular rhythm.  Pulmonary/Chest: Effort normal and breath sounds normal.  Musculoskeletal:  Normal Muscle Bulk and Muscle Testing Reveals:  Upper Extremities: Full ROM and Muscle Strength 5/5 Lower Extremities: Full ROM and Muscle Strength 5/5 Arises from chair with ease Narrow Based gait  Neurological: She is alert and oriented to person, place, and time.  Skin: Skin is warm and dry.  Psychiatric: She has a normal mood and affect.  Nursing note and vitals reviewed.         Assessment & Plan:  1.Complex regional pain syndrome right lower extremity postoperative. She's s/p post plantar fascial release. She continue withsensitivity totouch. She has diffuse numbness and tingling. Continue current medication regime with  Gabapentin.09/30/2017 Refilled: Nucynta 100 mg one tablet four times a day. #120. We will continue the opioid monitoring program, this consists of regular clinic visits, examinations, urine drug screen,  pill counts as well as use of [place] Controlled Substance reporting System. 2.Myofascial pain syndrome:Continue current medication regime with Tizanidine and ice, heat and exercise regime. 09/30/2017 3. Depression: Continue: Zoloft. 09/30/2017 4. Cervical Post Laminectomy: Continue to Monitor. 09/30/2017 6. Cervical Radiculopathy/ Left Arm Pain.Continue current medication regime with Gabapentin. 09/30/2017 7. Peripheral Neuropathy: Continue current medication regime with  Gabapentin: 09/30/2017 8. Bilateral Shoulder Pain: Continue current medication regime and Continue to Monitor. 09/30/2017  20 minutes of face to face patient care time was spent during this visit. All questions were encouraged and answered.  F/U in 1 month

## 2017-10-21 ENCOUNTER — Other Ambulatory Visit: Payer: Self-pay | Admitting: Physical Medicine & Rehabilitation

## 2017-11-01 ENCOUNTER — Encounter: Payer: Self-pay | Admitting: Registered Nurse

## 2017-11-01 ENCOUNTER — Encounter: Payer: BLUE CROSS/BLUE SHIELD | Attending: Physical Medicine & Rehabilitation | Admitting: Registered Nurse

## 2017-11-01 VITALS — BP 162/97 | HR 66 | Ht 65.0 in | Wt 252.0 lb

## 2017-11-01 DIAGNOSIS — M961 Postlaminectomy syndrome, not elsewhere classified: Secondary | ICD-10-CM | POA: Diagnosis not present

## 2017-11-01 DIAGNOSIS — G894 Chronic pain syndrome: Secondary | ICD-10-CM | POA: Diagnosis not present

## 2017-11-01 DIAGNOSIS — Z79891 Long term (current) use of opiate analgesic: Secondary | ICD-10-CM

## 2017-11-01 DIAGNOSIS — M7918 Myalgia, other site: Secondary | ICD-10-CM

## 2017-11-01 DIAGNOSIS — M5412 Radiculopathy, cervical region: Secondary | ICD-10-CM | POA: Diagnosis not present

## 2017-11-01 DIAGNOSIS — M5010 Cervical disc disorder with radiculopathy, unspecified cervical region: Secondary | ICD-10-CM

## 2017-11-01 DIAGNOSIS — M797 Fibromyalgia: Secondary | ICD-10-CM | POA: Diagnosis not present

## 2017-11-01 DIAGNOSIS — Z5181 Encounter for therapeutic drug level monitoring: Secondary | ICD-10-CM | POA: Insufficient documentation

## 2017-11-01 DIAGNOSIS — G90521 Complex regional pain syndrome I of right lower limb: Secondary | ICD-10-CM

## 2017-11-01 MED ORDER — TAPENTADOL HCL 100 MG PO TABS: 1.0000 | ORAL_TABLET | Freq: Four times a day (QID) | ORAL | 0 refills | Status: DC | PRN

## 2017-11-01 MED ORDER — TIZANIDINE HCL 4 MG PO TABS: 4.0000 mg | ORAL_TABLET | Freq: Every day | ORAL | 3 refills | Status: DC | PRN

## 2017-11-01 NOTE — Progress Notes (Signed)
Subjective:    Patient ID: Sabrina Williams, female    DOB: [birth year], 47 y.o.   MRN: [id number]  HPI: [name] is a 47 year female who returns for follow up appointment for chronic pain and medication refill. She states her pain is located in her left shoulder, right knee and right foot. She rates her pain 3. Her current exercise regime is walking and attending [employer] 203 days a week.   [name] Morphine equivalent is 160.00 MME. Last UDS was Performed on 07/01/2017, it was consistent for prescribed medication. Positive for ETOH, she was educated and narcotic policy was reviewed.   Pain Inventory Average Pain 3 Pain Right Now 3 My pain is constant, sharp, burning, dull, stabbing, tingling and aching  In the last 24 hours, has pain interfered with the following? General activity 5 Relation with others 3 Enjoyment of life 4 What TIME of day is your pain at its worst? evening Sleep (in general) Fair  Pain is worse with: walking, standing and some activites Pain improves with: rest, heat/ice, therapy/exercise and medication Relief from Meds: 7  Mobility walk without assistance ability to climb steps?  yes do you drive?  yes  Function not employed: date last employed . I need assistance with the following:  meal prep, household duties and shopping  Neuro/Psych numbness  Prior Studies Any changes since last visit?  no  Physicians involved in your care Any changes since last visit?  no   Family History  Problem Relation Age of Onset  . Cancer Mother        multiple myeloma  . Diabetes Father   . Kidney disease Father   . Heart disease Father    Social History   Socioeconomic History  . Marital status: Married    Spouse name: Not on file  . Number of children: Not on file  . Years of education: Not on file  . Highest education level: Not on file  Occupational History  . Not on file  Social Needs  . Financial resource strain: Not on file    . Food insecurity:    Worry: Not on file    Inability: Not on file  . Transportation needs:    Medical: Not on file    Non-medical: Not on file  Tobacco Use  . Smoking status: Never Smoker  . Smokeless tobacco: Never Used  Substance and Sexual Activity  . Alcohol use: No    Alcohol/week: 0.0 oz  . Drug use: No  . Sexual activity: Not on file  Lifestyle  . Physical activity:    Days per week: Not on file    Minutes per session: Not on file  . Stress: Not on file  Relationships  . Social connections:    Talks on phone: Not on file    Gets together: Not on file    Attends religious service: Not on file    Active member of club or organization: Not on file    Attends meetings of clubs or organizations: Not on file    Relationship status: Not on file  Other Topics Concern  . Not on file  Social History Narrative  . Not on file   Past Surgical History:  Procedure Laterality Date  . CESAREAN SECTION     x2  . dental implant  July 2015  . ENDOSCOPIC PLANTAR FASCIOTOMY Right August 2015  . LITHOTRIPSY     multiple  . SHOULDER SURGERY  6/06  .  SMALL INTESTINE SURGERY     cervical laminectomy  . utereroscopy  1995   Past Medical History:  Diagnosis Date  . Chronic kidney disease    stones  . Depression   . Diabetes mellitus   . Hypertension   . Neuromuscular disorder ([place])    There were no vitals taken for this visit.  Opioid Risk Score:   Fall Risk Score:  `1  Depression screen PHQ 2/9  Depression screen [facility] 2/9 07/01/2017 03/11/2017 02/14/2017 12/03/2016 09/30/2016 08/30/2016 07/25/2015  Decreased Interest 0 0 0 0 _0 Down, Depressed, Hopeless 0 0 0 0 _1 PHQ - 2 Score 0 0 0 0 _2 Altered sleeping - - - - - - -  Tired, decreased energy - - - - - - -  Change in appetite - - - - - - -  Feeling bad or failure about yourself  - - - - - - -  Trouble concentrating - - - - - - -  Moving slowly or fidgety/restless - - - - - - -  Suicidal thoughts - - - - - - -   PHQ-9 Score - - - - - - -    Review of Systems  Constitutional: Negative.   HENT: Negative.   Eyes: Negative.   Respiratory: Negative.   Cardiovascular: Negative.   Gastrointestinal: Negative.   Endocrine: Negative.   Genitourinary: Negative.   Musculoskeletal: Positive for arthralgias and myalgias.  Skin: Negative.   Allergic/Immunologic: Negative.   Neurological: Positive for numbness.  Hematological: Negative.   Psychiatric/Behavioral: Negative.   All other systems reviewed and are negative.      Objective:   Physical Exam  Constitutional: She is oriented to person, place, and time. She appears well-developed and well-nourished.  HENT:  Head: Normocephalic and atraumatic.  Neck: Normal range of motion. Neck supple.  Cardiovascular: Normal rate and regular rhythm.  Pulmonary/Chest: Effort normal and breath sounds normal.  Musculoskeletal:  Normal Muscle Bulk and Muscle testing Reveals: Upper Extremities: Full ROM and Muscle Strength 5/5 Back without spinal tenderness noted Lower Extremities: Full ROM and Muscle Strength 5/5 Arises from Table with ease Narrow Based Gait  Neurological: She is alert and oriented to person, place, and time.  Skin: Skin is warm and dry.  Psychiatric: She has a normal mood and affect.  Nursing note and vitals reviewed.         Assessment & Plan:  1.Complex regional pain syndrome right lower extremity postoperative. She's s/p post plantar fascial release. She continue withsensitivity totouch. She has diffuse numbness and tingling. Continuecurrent medication regime withGabapentin.11/01/2017 Refilled: Nucynta 100 mg one tablet four times a day. #120. We will continue the opioid monitoring program, this consists of regular clinic visits, examinations, urine drug screen, pill counts as well as use of [place] Controlled Substance reporting System. 2.Myofascial pain syndrome:Continuecurrent medication regime withTizanidine  and ice, heat and exercise regime. 11/01/2017 3. Depression: Continue: Zoloft. 11/01/2017 4. Cervical Post Laminectomy: Continue to Monitor. 11/01/2017 6. Cervical Radiculopathy/ Left Arm Pain.No complaints today.Continuecurrent medication regime withGabapentin. 11/01/2017 7. Peripheral Neuropathy: Continuecurrent medication regime withGabapentin: 11/01/2017 8. Left Shoulder Pain: Continue current medication regime and Continue to Monitor. 09/30/2017  20 minutes of face to face patient care time was spent during this visit. All questions were encouraged and answered.  F/U in 1 month

## 2017-11-02 ENCOUNTER — Other Ambulatory Visit: Payer: Self-pay | Admitting: Registered Nurse

## 2017-12-05 ENCOUNTER — Encounter: Payer: BLUE CROSS/BLUE SHIELD | Attending: Physical Medicine & Rehabilitation | Admitting: Registered Nurse

## 2017-12-05 ENCOUNTER — Encounter: Payer: Self-pay | Admitting: Registered Nurse

## 2017-12-05 VITALS — BP 96/69 | HR 87 | Resp 14 | Ht 67.0 in | Wt 242.0 lb

## 2017-12-05 DIAGNOSIS — M5010 Cervical disc disorder with radiculopathy, unspecified cervical region: Secondary | ICD-10-CM

## 2017-12-05 DIAGNOSIS — M961 Postlaminectomy syndrome, not elsewhere classified: Secondary | ICD-10-CM

## 2017-12-05 DIAGNOSIS — M7918 Myalgia, other site: Secondary | ICD-10-CM | POA: Diagnosis not present

## 2017-12-05 DIAGNOSIS — Z79891 Long term (current) use of opiate analgesic: Secondary | ICD-10-CM

## 2017-12-05 DIAGNOSIS — M797 Fibromyalgia: Secondary | ICD-10-CM | POA: Insufficient documentation

## 2017-12-05 DIAGNOSIS — M5412 Radiculopathy, cervical region: Secondary | ICD-10-CM | POA: Diagnosis not present

## 2017-12-05 DIAGNOSIS — G609 Hereditary and idiopathic neuropathy, unspecified: Secondary | ICD-10-CM

## 2017-12-05 DIAGNOSIS — Z5181 Encounter for therapeutic drug level monitoring: Secondary | ICD-10-CM

## 2017-12-05 DIAGNOSIS — G894 Chronic pain syndrome: Secondary | ICD-10-CM | POA: Diagnosis not present

## 2017-12-05 DIAGNOSIS — G90521 Complex regional pain syndrome I of right lower limb: Secondary | ICD-10-CM | POA: Diagnosis not present

## 2017-12-05 MED ORDER — TAPENTADOL HCL 100 MG PO TABS: 1.0000 | ORAL_TABLET | Freq: Four times a day (QID) | ORAL | 0 refills | Status: DC | PRN

## 2017-12-05 NOTE — Progress Notes (Signed)
Subjective:    Patient ID: Sabrina Williams, female    DOB: 31-May-1970, 47 y.o.   MRN: [id number]  HPI: Sabrina Williams is a 47 year old female who returns for follow up appointment for chronic pain and medication refill. She states her pain is located in her neck, left hand with numbness, left shoulder and right foot. She rates her pain 4. Her current exercise regime is walking, walking on treadmill for 30 minutes 2- 3 times a week and stationary bicycle 2-3 times a week.   [name] Morphine Equivalent is 160.00 MME. Last UDS was consistent for prescribed medications, see note.    Pain Inventory Average Pain 3 Pain Right Now 4 My pain is constant, sharp, burning, dull, stabbing, tingling and aching  In the last 24 hours, has pain interfered with the following? General activity 5 Relation with others 3 Enjoyment of life 4 What TIME of day is your pain at its worst? evening Sleep (in general) Fair  Pain is worse with: walking, standing and some activites Pain improves with: rest, heat/ice, therapy/exercise and medication Relief from Meds: 7  Mobility walk without assistance how many minutes can you walk? 60+ ability to climb steps?  yes do you drive?  yes Do you have any goals in this area?  yes  Function not employed: date last employed . I need assistance with the following:  meal prep, household duties and shopping Do you have any goals in this area?  yes  Neuro/Psych No problems in this area  Prior Studies Any changes since last visit?  no  Physicians involved in your care Any changes since last visit?  no   Family History  Problem Relation Age of Onset  . Cancer Mother        multiple myeloma  . Diabetes Father   . Kidney disease Father   . Heart disease Father    Social History   Socioeconomic History  . Marital status: Married    Spouse name: Not on file  . Number of children: Not on file  . Years of education: Not on file  . Highest  education level: Not on file  Occupational History  . Not on file  Social Needs  . Financial resource strain: Not on file  . Food insecurity:    Worry: Not on file    Inability: Not on file  . Transportation needs:    Medical: Not on file    Non-medical: Not on file  Tobacco Use  . Smoking status: Never Smoker  . Smokeless tobacco: Never Used  Substance and Sexual Activity  . Alcohol use: No    Alcohol/week: 0.0 oz  . Drug use: No  . Sexual activity: Not on file  Lifestyle  . Physical activity:    Days per week: Not on file    Minutes per session: Not on file  . Stress: Not on file  Relationships  . Social connections:    Talks on phone: Not on file    Gets together: Not on file    Attends religious service: Not on file    Active member of club or organization: Not on file    Attends meetings of clubs or organizations: Not on file    Relationship status: Not on file  Other Topics Concern  . Not on file  Social History Narrative  . Not on file   Past Surgical History:  Procedure Laterality Date  . CESAREAN SECTION  x2  . dental implant  July 2015  . ENDOSCOPIC PLANTAR FASCIOTOMY Right August 2015  . LITHOTRIPSY     multiple  . SHOULDER SURGERY  6/06  . SMALL INTESTINE SURGERY     cervical laminectomy  . utereroscopy  1995   Past Medical History:  Diagnosis Date  . Chronic kidney disease    stones  . Depression   . Diabetes mellitus   . Hypertension   . Neuromuscular disorder ([facility])    BP (!) 88/66 (BP Location: Left Arm, Patient Position: Sitting, Cuff Size: Large)   Pulse 87   Resp 14   Ht '5\' 7"'$  (1.702 m)   Wt 242 lb (109.8 kg)   SpO2 96%   BMI 37.90 kg/m   Opioid Risk Score:   Fall Risk Score:  `1  Depression screen PHQ 2/9  Depression screen [facility] 2/9 07/01/2017 03/11/2017 02/14/2017 12/03/2016 09/30/2016 08/30/2016 07/25/2015  Decreased Interest 0 0 0 0 '1 1 1  '$ Down, Depressed, Hopeless 0 0 0 0 '1 1 1  '$ PHQ - 2 Score 0 0 0 0 '2 2 2  '$ Altered sleeping  - - - - - - -  Tired, decreased energy - - - - - - -  Change in appetite - - - - - - -  Feeling bad or failure about yourself  - - - - - - -  Trouble concentrating - - - - - - -  Moving slowly or fidgety/restless - - - - - - -  Suicidal thoughts - - - - - - -  PHQ-9 Score - - - - - - -    Review of Systems  Constitutional: Negative.   HENT: Negative.   Eyes: Negative.   Respiratory: Negative.   Cardiovascular: Negative.   Gastrointestinal: Negative.   Endocrine: Negative.   Genitourinary: Negative.   Musculoskeletal: Positive for myalgias, neck pain and neck stiffness.  Skin: Negative.   Allergic/Immunologic: Negative.   Neurological: Negative.   Psychiatric/Behavioral: Negative.        Objective:   Physical Exam  Constitutional: She is oriented to person, place, and time. She appears well-developed and well-nourished.  HENT:  Head: Normocephalic and atraumatic.  Neck: Normal range of motion. Neck supple.  Cardiovascular: Normal rate and regular rhythm.  Pulmonary/Chest: Effort normal and breath sounds normal.  Musculoskeletal:  Normal Muscle Bulk and Muscle Testing Reveals: Upper Extremities: Full ROM and Muscle Strength 5/5 Right AC Joint Tenderness Lower Extremities: Full ROM and Muscle Strength 5/5 Arises from chair with ease Narrow Based Gait  Neurological: She is alert and oriented to person, place, and time.  Skin: Skin is warm and dry.  Psychiatric: She has a normal mood and affect. Her behavior is normal.  Nursing note and vitals reviewed.         Assessment & Plan:  1.Complex regional pain syndrome right lower extremity postoperative. She's s/p post plantar fascial release. She continue withsensitivity totouch. She has diffuse numbness and tingling. Continuecurrent medication regime withGabapentin.12/05/2017 Refilled: Nucynta 100 mg one tablet four times a day. #120. We will continue the opioid monitoring program, this consists of regular clinic  visits, examinations, urine drug screen, pill counts as well as use of [place] Controlled Substance reporting System. 2.Myofascial pain syndrome:Continuecurrent medication regime withTizanidine and ice, heat and exercise regime. 12/05/2017 3. Depression: Continue: Zoloft. PCP Following. 12/05/2017 4. Cervical Post Laminectomy: Continue to Monitor. 12/05/2017 6. Cervical Radiculopathy/ Left Arm Pain.Continuecurrent medication regime withGabapentin. 12/05/2017 7. Peripheral Neuropathy: Continuecurrent  medication regime withGabapentin: 12/05/2017 8.LeftShoulder Pain: Continue current medication regime and Continue to Monitor. 12/05/2017  20 minutes of face to face patient care time was spent during this visit. All questions were encouraged and answered.  F/U in 1 month

## 2018-01-09 ENCOUNTER — Encounter: Payer: Self-pay | Admitting: Registered Nurse

## 2018-01-09 ENCOUNTER — Encounter: Payer: BLUE CROSS/BLUE SHIELD | Attending: Physical Medicine & Rehabilitation | Admitting: Registered Nurse

## 2018-01-09 VITALS — BP 93/65 | HR 86 | Ht 68.0 in | Wt 249.0 lb

## 2018-01-09 DIAGNOSIS — M961 Postlaminectomy syndrome, not elsewhere classified: Secondary | ICD-10-CM | POA: Diagnosis present

## 2018-01-09 DIAGNOSIS — M7918 Myalgia, other site: Secondary | ICD-10-CM

## 2018-01-09 DIAGNOSIS — G90521 Complex regional pain syndrome I of right lower limb: Secondary | ICD-10-CM | POA: Diagnosis not present

## 2018-01-09 DIAGNOSIS — M65331 Trigger finger, right middle finger: Secondary | ICD-10-CM

## 2018-01-09 DIAGNOSIS — M720 Palmar fascial fibromatosis [Dupuytren]: Secondary | ICD-10-CM

## 2018-01-09 DIAGNOSIS — G609 Hereditary and idiopathic neuropathy, unspecified: Secondary | ICD-10-CM

## 2018-01-09 DIAGNOSIS — G894 Chronic pain syndrome: Secondary | ICD-10-CM

## 2018-01-09 DIAGNOSIS — M5010 Cervical disc disorder with radiculopathy, unspecified cervical region: Secondary | ICD-10-CM

## 2018-01-09 DIAGNOSIS — Z79891 Long term (current) use of opiate analgesic: Secondary | ICD-10-CM | POA: Diagnosis not present

## 2018-01-09 DIAGNOSIS — R200 Anesthesia of skin: Secondary | ICD-10-CM

## 2018-01-09 DIAGNOSIS — M797 Fibromyalgia: Secondary | ICD-10-CM | POA: Insufficient documentation

## 2018-01-09 DIAGNOSIS — Z5181 Encounter for therapeutic drug level monitoring: Secondary | ICD-10-CM | POA: Insufficient documentation

## 2018-01-09 DIAGNOSIS — M5412 Radiculopathy, cervical region: Secondary | ICD-10-CM | POA: Insufficient documentation

## 2018-01-09 MED ORDER — TAPENTADOL HCL 100 MG PO TABS: 1.0000 | ORAL_TABLET | Freq: Four times a day (QID) | ORAL | 0 refills | Status: DC | PRN

## 2018-01-09 NOTE — Progress Notes (Signed)
Subjective:    Patient ID: Sabrina Williams, female    DOB: 10-Jun-1970, 47 y.o.   MRN: [id number]  HPI; Sabrina Williams is a 47 year old female who returns for follow up appointment for chronic pain and medication refill. She states her pain is located in her left shoulder radiating into her left arm and left hand with tingling and neck pain radiating into her left shoulder. She rates her pain 3. Her current exercise regime is walking and attending [employer] 2-3 days a week.   Sabrina Williams is scheduled for oral surgery on 02/01/2018, Dr. Darnelle Maffucci prescribed Hydrocodone for acute pain.   Sabrina Williams Morphine Equivalent is 160.00 MME. Last UDS was Performed on 07/01/2017, it was consistent for prescribed medication. See note for details. UDS ordered today.   Pain Inventory Average Pain 4 Pain Right Now 3 My pain is constant, sharp, burning, dull, stabbing, tingling and aching  In the last 24 hours, has pain interfered with the following? General activity 6 Relation with others 4 Enjoyment of life 5 What TIME of day is your pain at its worst? evening Sleep (in general) Fair  Pain is worse with: standing and some activites Pain improves with: rest, heat/ice, therapy/exercise and medication Relief from Meds: 7  Mobility walk without assistance ability to climb steps?  yes do you drive?  yes  Function not employed: date last employed . I need assistance with the following:  meal prep, household duties and shopping  Neuro/Psych No problems in this area  Prior Studies Any changes since last visit?  no  Physicians involved in your care Any changes since last visit?  no   Family History  Problem Relation Age of Onset  . Cancer Mother        multiple myeloma  . Diabetes Father   . Kidney disease Father   . Heart disease Father    Social History   Socioeconomic History  . Marital status: Married    Spouse name: Not on file  . Number of children: Not on  file  . Years of education: Not on file  . Highest education level: Not on file  Occupational History  . Not on file  Social Needs  . Financial resource strain: Not on file  . Food insecurity:    Worry: Not on file    Inability: Not on file  . Transportation needs:    Medical: Not on file    Non-medical: Not on file  Tobacco Use  . Smoking status: Never Smoker  . Smokeless tobacco: Never Used  Substance and Sexual Activity  . Alcohol use: No    Alcohol/week: 0.0 standard drinks  . Drug use: No  . Sexual activity: Not on file  Lifestyle  . Physical activity:    Days per week: Not on file    Minutes per session: Not on file  . Stress: Not on file  Relationships  . Social connections:    Talks on phone: Not on file    Gets together: Not on file    Attends religious service: Not on file    Active member of club or organization: Not on file    Attends meetings of clubs or organizations: Not on file    Relationship status: Not on file  Other Topics Concern  . Not on file  Social History Narrative  . Not on file   Past Surgical History:  Procedure Laterality Date  . CESAREAN SECTION  x2  . dental implant  July 2015  . ENDOSCOPIC PLANTAR FASCIOTOMY Right August 2015  . LITHOTRIPSY     multiple  . SHOULDER SURGERY  6/06  . SMALL INTESTINE SURGERY     cervical laminectomy  . utereroscopy  1995   Past Medical History:  Diagnosis Date  . Chronic kidney disease    stones  . Depression   . Diabetes mellitus   . Hypertension   . Neuromuscular disorder ([facility])    BP 93/65   Pulse 86   Ht _0  (1.727 m)   Wt 249 lb (112.9 kg)   SpO2 97%   BMI 37.86 kg/m   Opioid Risk Score:   Fall Risk Score:  `1  Depression screen PHQ 2/9  Depression screen [facility] 2/9 07/01/2017 03/11/2017 02/14/2017 12/03/2016 09/30/2016 08/30/2016 07/25/2015  Decreased Interest 0 0 0 0 _1 Down, Depressed, Hopeless 0 0 0 0 _2 PHQ - 2 Score 0 0 0 0 _3 Altered sleeping - - - - - - -    Tired, decreased energy - - - - - - -  Change in appetite - - - - - - -  Feeling bad or failure about yourself  - - - - - - -  Trouble concentrating - - - - - - -  Moving slowly or fidgety/restless - - - - - - -  Suicidal thoughts - - - - - - -  PHQ-9 Score - - - - - - -     Review of Systems  Constitutional: Negative.   HENT: Negative.   Eyes: Negative.   Respiratory: Negative.   Cardiovascular: Negative.   Gastrointestinal: Negative.   Endocrine: Negative.   Genitourinary: Negative.   Musculoskeletal: Positive for arthralgias, back pain, gait problem, myalgias and neck pain.  Skin: Negative.   Allergic/Immunologic: Negative.   Hematological: Negative.   Psychiatric/Behavioral: Negative.   All other systems reviewed and are negative.      Objective:   Physical Exam  Constitutional: She is oriented to person, place, and time. She appears well-developed and well-nourished.  HENT:  Head: Normocephalic and atraumatic.  Neck: Normal range of motion. Neck supple.  Cardiovascular: Normal rate and regular rhythm.  Pulmonary/Chest: Effort normal and breath sounds normal.  Musculoskeletal:  Normal Muscle Bulk and Muscle Testing Reveals: Upper Extremities: Full ROM ad Muscle Strength 5/5 Lumbar Paraspinal Tenderness: L-4-L-5 Mainly Right Side Lower Extremities: Full ROM and Muscle Strength 5/5 Arises from Table with ease Narrow Based Gait  Neurological: She is alert and oriented to person, place, and time.  Skin: Skin is warm and dry.  Psychiatric: She has a normal mood and affect. Her behavior is normal.  Nursing note and vitals reviewed.         Assessment & Plan:  1.Complex regional pain syndrome right lower extremity postoperative. She's s/p post plantar fascial release. She continue withsensitivity totouch. She has diffuse numbness and tingling. Continuecurrent medication regime withGabapentin.01/09/2018 Refilled: Nucynta 100 mg one tablet four times a day.  #120. We will continue the opioid monitoring program, this consists of regular clinic visits, examinations, urine drug screen, pill counts as well as use of [place] Controlled Substance reporting System. 2.Myofascial pain syndrome:Continuecurrent medication regime withTizanidine and ice, heat and exercise regime. 01/09/2018 3. Depression: Continue: Zoloft. PCP Following. 01/09/2018 4. Cervical Post Laminectomy: Continue to Monitor. 01/09/2018 6. Cervical Radiculopathy/ Left Arm Pain.Continuecurrent medication regime withGabapentin. 01/09/2018 7. Peripheral Neuropathy: Continuecurrent  medication regime withGabapentin: 01/09/2018 8.LeftShoulder Pain: S/P Trigger Point injection at [employer] office she reports. Orth Following. Continue current medication regime and Continue to Monitor. 01/09/2018  20 minutes of face to face patient care time was spent during this visit. All questions were encouraged and answered.  F/U in 1 month

## 2018-01-14 LAB — TOXASSURE SELECT,+ANTIDEPR,UR

## 2018-01-17 ENCOUNTER — Telehealth: Payer: Self-pay | Admitting: *Deleted

## 2018-01-17 NOTE — Telephone Encounter (Signed)
Urine drug screen for this encounter is consistent for prescribed medication 

## 2018-02-07 ENCOUNTER — Ambulatory Visit: Payer: BLUE CROSS/BLUE SHIELD | Admitting: Physical Medicine & Rehabilitation

## 2018-02-13 ENCOUNTER — Ambulatory Visit: Payer: BLUE CROSS/BLUE SHIELD | Admitting: Physical Medicine & Rehabilitation

## 2018-02-17 ENCOUNTER — Encounter: Payer: Self-pay | Admitting: Physical Medicine & Rehabilitation

## 2018-02-17 ENCOUNTER — Encounter: Payer: BLUE CROSS/BLUE SHIELD | Attending: Physical Medicine & Rehabilitation

## 2018-02-17 ENCOUNTER — Ambulatory Visit (HOSPITAL_BASED_OUTPATIENT_CLINIC_OR_DEPARTMENT_OTHER): Payer: BLUE CROSS/BLUE SHIELD | Admitting: Physical Medicine & Rehabilitation

## 2018-02-17 ENCOUNTER — Other Ambulatory Visit: Payer: Self-pay

## 2018-02-17 VITALS — BP 107/74 | HR 97 | Ht 67.0 in | Wt 246.0 lb

## 2018-02-17 DIAGNOSIS — M797 Fibromyalgia: Secondary | ICD-10-CM | POA: Insufficient documentation

## 2018-02-17 DIAGNOSIS — Z5181 Encounter for therapeutic drug level monitoring: Secondary | ICD-10-CM | POA: Insufficient documentation

## 2018-02-17 DIAGNOSIS — M961 Postlaminectomy syndrome, not elsewhere classified: Secondary | ICD-10-CM

## 2018-02-17 DIAGNOSIS — G90521 Complex regional pain syndrome I of right lower limb: Secondary | ICD-10-CM | POA: Diagnosis not present

## 2018-02-17 DIAGNOSIS — M5412 Radiculopathy, cervical region: Secondary | ICD-10-CM | POA: Insufficient documentation

## 2018-02-17 MED ORDER — TAPENTADOL HCL ER 200 MG PO TB12: 200.0000 mg | ORAL_TABLET | Freq: Two times a day (BID) | ORAL | 0 refills | Status: DC

## 2018-02-17 NOTE — Patient Instructions (Addendum)
Trial of tapentadol ER

## 2018-02-17 NOTE — Progress Notes (Signed)
Subjective:    Patient ID: Sabrina Williams, female    DOB: 1970/07/24, 47 y.o.   MRN: [id number]  HPI 47 year old female with chronic pain syndrome.  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  Also with chronic kidney disease followed by nephrology  Extensive oral surgery, post op hydrocodone '10mg'$  a total of 50 tablets Extensive post op swelling, took some Ibuprofen as well Has 2 posts placed upper and lower placement of permanent in January Seen by Nephrologist Dr Chipper Oman, ? Parathyroid abnormalities causing bone loss  Trigger finger injection per Ortho, has another appt next week to discuss surgery  We discussed her pain relief from Nucynta 100 mg 4 times daily she does have some ups and downs in terms of her pain levels during the day for the end of her dosing.  Pain Inventory Average Pain 6 Pain Right Now 4 My pain is constant, sharp, burning, dull, stabbing, tingling and aching  In the last 24 hours, has pain interfered with the following? General activity 8 Relation with others 7 Enjoyment of life 6 What TIME of day is your pain at its worst? evening Sleep (in general) Fair  Pain is worse with: standing and some activites Pain improves with: rest, heat/ice and therapy/exercise Relief from Meds: 5  Mobility walk without assistance how many minutes can you walk? 60 ability to climb steps?  yes do you drive?  yes  Function not employed: date last employed . I need assistance with the following:  meal prep, household duties and shopping  Neuro/Psych No problems in this area  Prior Studies Any changes since last visit?  no  Physicians involved in your care Any changes since last visit?  no   Family History  Problem Relation Age of Onset  . Cancer Mother        multiple myeloma  . Diabetes Father   . Kidney disease Father   . Heart disease Father     Social History   Socioeconomic History  . Marital status: Married    Spouse name: Not on file  . Number of children: Not on file  . Years of education: Not on file  . Highest education level: Not on file  Occupational History  . Not on file  Social Needs  . Financial resource strain: Not on file  . Food insecurity:    Worry: Not on file    Inability: Not on file  . Transportation needs:    Medical: Not on file    Non-medical: Not on file  Tobacco Use  . Smoking status: Never Smoker  . Smokeless tobacco: Never Used  Substance and Sexual Activity  . Alcohol use: No    Alcohol/week: 0.0 standard drinks  . Drug use: No  . Sexual activity: Not on file  Lifestyle  . Physical activity:    Days per week: Not on file    Minutes per session: Not on file  . Stress: Not on file  Relationships  . Social connections:    Talks on phone: Not on file    Gets together: Not on file    Attends religious service: Not on file    Active member of club or organization: Not on file    Attends meetings of clubs or organizations: Not on file    Relationship status: Not on file  Other Topics Concern  . Not on file  Social History Narrative  .  Not on file   Past Surgical History:  Procedure Laterality Date  . CESAREAN SECTION     x2  . dental implant  July 2015  . ENDOSCOPIC PLANTAR FASCIOTOMY Right August 2015  . LITHOTRIPSY     multiple  . SHOULDER SURGERY  6/06  . SMALL INTESTINE SURGERY     cervical laminectomy  . utereroscopy  1995   Past Medical History:  Diagnosis Date  . Chronic kidney disease    stones  . Depression   . Diabetes mellitus   . Hypertension   . Neuromuscular disorder (HCC)    BP 107/74   Pulse 97   Ht '5\' 7"'$  (1.702 m) Comment: reported  Wt 246 lb (111.6 kg)   SpO2 95%   BMI 38.53 kg/m   Opioid Risk Score:   Fall Risk Score:  `1  Depression screen PHQ 2/9  Depression screen [facility] 2/9 02/17/2018 07/01/2017 03/11/2017 02/14/2017 12/03/2016 09/30/2016  08/30/2016  Decreased Interest 0 0 0 0 0 1 1  Down, Depressed, Hopeless 0 0 0 0 0 1 1  PHQ - 2 Score 0 0 0 0 0 2 2  Altered sleeping - - - - - - -  Tired, decreased energy - - - - - - -  Change in appetite - - - - - - -  Feeling bad or failure about yourself  - - - - - - -  Trouble concentrating - - - - - - -  Moving slowly or fidgety/restless - - - - - - -  Suicidal thoughts - - - - - - -  PHQ-9 Score - - - - - - -   Review of Systems  Constitutional: Negative.   HENT: Positive for dental problem.        Has temporary implants  Eyes: Negative.   Respiratory: Negative.   Cardiovascular: Negative.   Gastrointestinal: Negative.   Endocrine: Negative.   Genitourinary: Negative.   Musculoskeletal: Negative.   Skin: Negative.   Allergic/Immunologic: Negative.   Neurological: Negative.   Hematological: Negative.   Psychiatric/Behavioral: Negative.   All other systems reviewed and are negative.      Objective:   Physical Exam  Constitutional: She is oriented to person, place, and time. She appears well-developed and well-nourished. No distress.  HENT:  Head: Normocephalic and atraumatic.  Eyes: Pupils are equal, round, and reactive to light. EOM are normal.  Pulmonary/Chest: No stridor. No respiratory distress.  Neurological: She is alert and oriented to person, place, and time. She has normal strength.  Motor strength is 5/5 bilateral deltoid, bicep, tricep, grip, hip flexion, knee extensor, ankle dorsiflexion Ambulates without evidence of toe drag or knee instability.  Skin: Skin is warm and dry. She is not diaphoretic.  Psychiatric: She has a normal mood and affect. Her behavior is normal. Judgment and thought content normal.  Nursing note and vitals reviewed.   Normal sensation bilateral C5 C6-C7-C8 L4-L5 L S1 dermatomal distribution      Assessment & Plan:  #1.  Chronic pain syndrome she has several pain associate diagnosis including cervical postlaminectomy syndrome,  CRPS type I right lower extremity. She is also had acute postoperative pain after extensive oral surgery.  We discussed her current chronic pain medications and the fact that she has some loss of effectiveness just prior to the next dose.  We discussed other treatment options including tapentadol extended release.  She can try an equivalent dosage of 200 mg twice daily in place of  the Patanol immediate release 100 mg 4 times daily.  She will trial this for a month and compare her pain relief. We discussed other pain medications option including Belbuca we discussed that this would allow her to reduce her clinic visits to quarterly given that it is a schedule III medication.  Discussed her pain medication options at length today. Over half of the 25 min visit was spent counseling and coordinating care.  PMP aware website reviewed, postoperative pain medicine from oral surgery as discussed with patient no other red flags. Urine drug screen appropriate from 01/09/2018

## 2018-03-10 ENCOUNTER — Encounter: Payer: BLUE CROSS/BLUE SHIELD | Admitting: Registered Nurse

## 2018-03-10 ENCOUNTER — Telehealth: Payer: Self-pay

## 2018-03-10 NOTE — Telephone Encounter (Signed)
Patient called today, stated has been having issues regarding the price of the extended release tapendatol 200mg , has asked if it is possible to go back to the non-extended version as with next year she is afraid that the price would increase even more and she not be able to afford it with her current insurance.

## 2018-03-13 NOTE — Telephone Encounter (Signed)
Sure we can do that at next visit

## 2018-03-15 ENCOUNTER — Encounter: Payer: BLUE CROSS/BLUE SHIELD | Attending: Physical Medicine & Rehabilitation | Admitting: Registered Nurse

## 2018-03-15 ENCOUNTER — Telehealth: Payer: Self-pay

## 2018-03-15 ENCOUNTER — Encounter: Payer: Self-pay | Admitting: Registered Nurse

## 2018-03-15 VITALS — BP 119/78 | HR 79 | Ht 67.0 in | Wt 251.0 lb

## 2018-03-15 DIAGNOSIS — M5412 Radiculopathy, cervical region: Secondary | ICD-10-CM | POA: Diagnosis not present

## 2018-03-15 DIAGNOSIS — M542 Cervicalgia: Secondary | ICD-10-CM

## 2018-03-15 DIAGNOSIS — R202 Paresthesia of skin: Secondary | ICD-10-CM

## 2018-03-15 DIAGNOSIS — M7918 Myalgia, other site: Secondary | ICD-10-CM

## 2018-03-15 DIAGNOSIS — M5010 Cervical disc disorder with radiculopathy, unspecified cervical region: Secondary | ICD-10-CM

## 2018-03-15 DIAGNOSIS — M797 Fibromyalgia: Secondary | ICD-10-CM | POA: Diagnosis not present

## 2018-03-15 DIAGNOSIS — G90521 Complex regional pain syndrome I of right lower limb: Secondary | ICD-10-CM | POA: Insufficient documentation

## 2018-03-15 DIAGNOSIS — G894 Chronic pain syndrome: Secondary | ICD-10-CM

## 2018-03-15 DIAGNOSIS — R200 Anesthesia of skin: Secondary | ICD-10-CM

## 2018-03-15 DIAGNOSIS — M25511 Pain in right shoulder: Secondary | ICD-10-CM

## 2018-03-15 DIAGNOSIS — Z5181 Encounter for therapeutic drug level monitoring: Secondary | ICD-10-CM | POA: Diagnosis not present

## 2018-03-15 DIAGNOSIS — M961 Postlaminectomy syndrome, not elsewhere classified: Secondary | ICD-10-CM | POA: Insufficient documentation

## 2018-03-15 DIAGNOSIS — G609 Hereditary and idiopathic neuropathy, unspecified: Secondary | ICD-10-CM

## 2018-03-15 DIAGNOSIS — M79641 Pain in right hand: Secondary | ICD-10-CM

## 2018-03-15 DIAGNOSIS — M25512 Pain in left shoulder: Secondary | ICD-10-CM

## 2018-03-15 DIAGNOSIS — Z79891 Long term (current) use of opiate analgesic: Secondary | ICD-10-CM

## 2018-03-15 MED ORDER — TAPENTADOL HCL 100 MG PO TABS: 1.0000 | ORAL_TABLET | Freq: Four times a day (QID) | ORAL | 0 refills | Status: DC

## 2018-03-15 NOTE — Telephone Encounter (Signed)
Patient was able to come in early for appointment with Sabrina Williams today.

## 2018-03-15 NOTE — Telephone Encounter (Signed)
Prior authorization resubmitted for nucynta 100 mg tablet #120 take 4 times a day. Previous authorization ran out 02/10/2018

## 2018-03-15 NOTE — Telephone Encounter (Signed)
Spoke with Sabrina Williams about patient, she called stating will be out of her medication tomorrow and will need a prior auth for any new medication for her.  Zella Ball stated to call her and have her come in this afternoon to get this started for her.  Called patient, no answer, left message to return call.

## 2018-03-15 NOTE — Progress Notes (Signed)
Subjective:    Patient ID: Sabrina Williams, female    DOB: Nov 05, 1970, 47 y.o.   MRN: [id number]  HPI: Sabrina Williams is a 47 year old female who returns for follow up appointment for chronic pain and medication refill. She staes her pain is located in her neck radiating into her bilateral shoulders, also reports pain in her  bilateral hands R>L, bilateral shoulders and right foot. She rates her pain 2. Her current exercise regime is walking.   Sabrina Williams Morphine Equivalent is 160.00 MME. Last UDS was Performed on 01/09/2018, it was consistent.   Sabrina Williams unable to purchase Tapentadol ER, due to cost, she will resume her Nucynta IR dose. CMA performed PA today.   Pain Inventory Average Pain 4 Pain Right Now 2 My pain is constant, burning, dull, tingling and aching  In the last 24 hours, has pain interfered with the following? General activity 5 Relation with others 2 Enjoyment of life 2 What TIME of day is your pain at its worst? evening Sleep (in general) Fair  Pain is worse with: walking, standing and some activites Pain improves with: rest, therapy/exercise and medication Relief from Meds: 8  Mobility walk without assistance ability to climb steps?  yes do you drive?  yes  Function Do you have any goals in this area?  no  Neuro/Psych dizziness  Prior Studies Any changes since last visit?  no  Physicians involved in your care Any changes since last visit?  no   Family History  Problem Relation Age of Onset  . Cancer Mother        multiple myeloma  . Diabetes Father   . Kidney disease Father   . Heart disease Father    Social History   Socioeconomic History  . Marital status: Married    Spouse name: Not on file  . Number of children: Not on file  . Years of education: Not on file  . Highest education level: Not on file  Occupational History  . Not on file  Social Needs  . Financial resource strain: Not on file  . Food insecurity:   Worry: Not on file    Inability: Not on file  . Transportation needs:    Medical: Not on file    Non-medical: Not on file  Tobacco Use  . Smoking status: Never Smoker  . Smokeless tobacco: Never Used  Substance and Sexual Activity  . Alcohol use: No    Alcohol/week: 0.0 standard drinks  . Drug use: No  . Sexual activity: Not on file  Lifestyle  . Physical activity:    Days per week: Not on file    Minutes per session: Not on file  . Stress: Not on file  Relationships  . Social connections:    Talks on phone: Not on file    Gets together: Not on file    Attends religious service: Not on file    Active member of club or organization: Not on file    Attends meetings of clubs or organizations: Not on file    Relationship status: Not on file  Other Topics Concern  . Not on file  Social History Narrative  . Not on file   Past Surgical History:  Procedure Laterality Date  . CESAREAN SECTION     x2  . dental implant  July 2015  . ENDOSCOPIC PLANTAR FASCIOTOMY Right August 2015  . LITHOTRIPSY     multiple  . SHOULDER SURGERY  6/06  . SMALL INTESTINE SURGERY     cervical laminectomy  . utereroscopy  1995   Past Medical History:  Diagnosis Date  . Chronic kidney disease    stones  . Depression   . Diabetes mellitus   . Hypertension   . Neuromuscular disorder (HCC)    BP 119/78   Pulse 79   Ht '5\' 7"'$  (1.702 m)   Wt 251 lb (113.9 kg)   LMP 03/01/2018   SpO2 97%   BMI 39.31 kg/m   Opioid Risk Score:   Fall Risk Score:  `1  Depression screen PHQ 2/9  Depression screen [facility] 2/9 02/17/2018 07/01/2017 03/11/2017 02/14/2017 12/03/2016 09/30/2016 08/30/2016  Decreased Interest 0 0 0 0 0 1 1  Down, Depressed, Hopeless 0 0 0 0 0 1 1  PHQ - 2 Score 0 0 0 0 0 2 2  Altered sleeping - - - - - - -  Tired, decreased energy - - - - - - -  Change in appetite - - - - - - -  Feeling bad or failure about yourself  - - - - - - -  Trouble concentrating - - - - - - -  Moving slowly or  fidgety/restless - - - - - - -  Suicidal thoughts - - - - - - -  PHQ-9 Score - - - - - - -     Review of Systems  Constitutional: Negative.   HENT: Negative.   Eyes: Negative.   Respiratory: Negative.   Cardiovascular: Negative.   Gastrointestinal: Negative.   Endocrine: Negative.   Genitourinary: Negative.   Musculoskeletal: Positive for arthralgias, back pain and myalgias.  Skin: Negative.   Allergic/Immunologic: Negative.   Neurological: Positive for dizziness.  Hematological: Negative.   Psychiatric/Behavioral: Negative.   All other systems reviewed and are negative.      Objective:   Physical Exam  Constitutional: She is oriented to person, place, and time. She appears well-developed and well-nourished.  HENT:  Head: Normocephalic and atraumatic.  Neck: Normal range of motion. Neck supple.  Cardiovascular: Normal rate and regular rhythm.  Pulmonary/Chest: Effort normal and breath sounds normal.  Musculoskeletal:  Normal Muscle Bulk and Muscle Testing Reveals: Upper Extremities: Full ROM and Muscle Strength 5/5 Lower Extremities: Full ROM and Muscle Strength 5/5 Arises from Table with ease Narrow Based Gait  Neurological: She is alert and oriented to person, place, and time.  Skin: Skin is warm and dry.  Psychiatric: She has a normal mood and affect. Her behavior is normal.  Nursing note and vitals reviewed.         Assessment & Plan:  1.Complex regional pain syndrome right lower extremity postoperative. She's s/p post plantar fascial release. She continue withsensitivity totouch. She has diffuse numbness and tingling. Continuecurrent medication regime withGabapentin.03/15/2018 Refilled: Nucynta 100 mg one tablet four times a day. #120. We will continue the opioid monitoring program, this consists of regular clinic visits, examinations, urine drug screen, pill counts as well as use of [place] Controlled Substance reporting System. 2.Myofascial  pain syndrome:Continuecurrent medication regime withTizanidine and ice, heat and exercise regime. 03/15/2018 3. Depression: Continue: Zoloft.PCP Following.03/15/2018 4. Cervical Post Laminectomy: Continue to Monitor. 03/15/2018 5. Cervicalgia/Cervical Radiculopathy/ Bilateral Hand Pain, Left hand with tingling and burning. Continuecurrent medication regime withGabapentin. 03/15/2018 6. Peripheral Neuropathy: Continuecurrent medication regime withGabapentin: 03/15/2018 7.BilateralShoulder Pain:  Ortho Following. Continue current medication regime and Continue to Monitor. 03/15/2018  20 minutes of face to face patient care  time was spent during this visit. All questions were encouraged and answered.  F/U in 1 month

## 2018-03-17 ENCOUNTER — Encounter: Payer: BLUE CROSS/BLUE SHIELD | Admitting: Registered Nurse

## 2018-03-20 ENCOUNTER — Other Ambulatory Visit: Payer: Self-pay | Admitting: Registered Nurse

## 2018-04-06 ENCOUNTER — Encounter: Payer: Self-pay | Admitting: Registered Nurse

## 2018-04-06 ENCOUNTER — Encounter: Payer: BLUE CROSS/BLUE SHIELD | Attending: Physical Medicine & Rehabilitation | Admitting: Registered Nurse

## 2018-04-06 VITALS — BP 137/90 | HR 80 | Ht 67.0 in | Wt 248.6 lb

## 2018-04-06 DIAGNOSIS — M5010 Cervical disc disorder with radiculopathy, unspecified cervical region: Secondary | ICD-10-CM

## 2018-04-06 DIAGNOSIS — Z5181 Encounter for therapeutic drug level monitoring: Secondary | ICD-10-CM | POA: Insufficient documentation

## 2018-04-06 DIAGNOSIS — M5412 Radiculopathy, cervical region: Secondary | ICD-10-CM | POA: Diagnosis not present

## 2018-04-06 DIAGNOSIS — M797 Fibromyalgia: Secondary | ICD-10-CM | POA: Insufficient documentation

## 2018-04-06 DIAGNOSIS — R202 Paresthesia of skin: Secondary | ICD-10-CM

## 2018-04-06 DIAGNOSIS — R200 Anesthesia of skin: Secondary | ICD-10-CM

## 2018-04-06 DIAGNOSIS — G609 Hereditary and idiopathic neuropathy, unspecified: Secondary | ICD-10-CM | POA: Diagnosis not present

## 2018-04-06 DIAGNOSIS — M778 Other enthesopathies, not elsewhere classified: Secondary | ICD-10-CM

## 2018-04-06 DIAGNOSIS — G90521 Complex regional pain syndrome I of right lower limb: Secondary | ICD-10-CM | POA: Diagnosis not present

## 2018-04-06 DIAGNOSIS — M961 Postlaminectomy syndrome, not elsewhere classified: Secondary | ICD-10-CM | POA: Diagnosis present

## 2018-04-06 DIAGNOSIS — M79641 Pain in right hand: Secondary | ICD-10-CM

## 2018-04-06 DIAGNOSIS — M7918 Myalgia, other site: Secondary | ICD-10-CM

## 2018-04-06 DIAGNOSIS — M7581 Other shoulder lesions, right shoulder: Secondary | ICD-10-CM

## 2018-04-06 MED ORDER — TAPENTADOL HCL 100 MG PO TABS: 1.0000 | ORAL_TABLET | Freq: Four times a day (QID) | ORAL | 0 refills | Status: DC

## 2018-04-06 NOTE — Progress Notes (Signed)
Subjective:    Patient ID: Sabrina Williams, female    DOB: 1971-03-10, 47 y.o.   MRN: [id number]  HPI: Sabrina Williams is a 47 year old female who returns for follow up appointment for chronic pain and medication refill. She states her pain is located in her right shoulder, right hand, left hand and left arm with tingling and burning. She rates her pain 3. Her current exercise regime is walking.   Sabrina Williams Morphine Equivalent is 160.00 MME. Last UDS was Performed on 01/09/18 it was consistent.   Pain Inventory Average Pain 4 Pain Right Now 3 My pain is constant, sharp, burning, dull, stabbing and tingling  In the last 24 hours, has pain interfered with the following? General activity 6 Relation with others 4 Enjoyment of life 4 What TIME of day is your pain at its worst? evening Sleep (in general) Fair  Pain is worse with: standing and some activites Pain improves with: rest, heat/ice, therapy/exercise and medication Relief from Meds: 7  Mobility walk without assistance ability to climb steps?  yes do you drive?  yes  Function I need assistance with the following:  meal prep, household duties and shopping  Neuro/Psych No problems in this area  Prior Studies Any changes since last visit?  no  Physicians involved in your care Any changes since last visit?  no   Family History  Problem Relation Age of Onset  . Cancer Mother        multiple myeloma  . Diabetes Father   . Kidney disease Father   . Heart disease Father    Social History   Socioeconomic History  . Marital status: Married    Spouse name: Not on file  . Number of children: Not on file  . Years of education: Not on file  . Highest education level: Not on file  Occupational History  . Not on file  Social Needs  . Financial resource strain: Not on file  . Food insecurity:    Worry: Not on file    Inability: Not on file  . Transportation needs:    Medical: Not on file    Non-medical:  Not on file  Tobacco Use  . Smoking status: Never Smoker  . Smokeless tobacco: Never Used  Substance and Sexual Activity  . Alcohol use: No    Alcohol/week: 0.0 standard drinks  . Drug use: No  . Sexual activity: Not on file  Lifestyle  . Physical activity:    Days per week: Not on file    Minutes per session: Not on file  . Stress: Not on file  Relationships  . Social connections:    Talks on phone: Not on file    Gets together: Not on file    Attends religious service: Not on file    Active member of club or organization: Not on file    Attends meetings of clubs or organizations: Not on file    Relationship status: Not on file  Other Topics Concern  . Not on file  Social History Narrative  . Not on file   Past Surgical History:  Procedure Laterality Date  . CESAREAN SECTION     x2  . dental implant  July 2015  . ENDOSCOPIC PLANTAR FASCIOTOMY Right August 2015  . LITHOTRIPSY     multiple  . SHOULDER SURGERY  6/06  . SMALL INTESTINE SURGERY     cervical laminectomy  . utereroscopy  1995   Past  Medical History:  Diagnosis Date  . Chronic kidney disease    stones  . Depression   . Diabetes mellitus   . Hypertension   . Neuromuscular disorder ([place])    There were no vitals taken for this visit.  Opioid Risk Score:   Fall Risk Score:  `1  Depression screen PHQ 2/9  Depression screen [facility] 2/9 02/17/2018 07/01/2017 03/11/2017 02/14/2017 12/03/2016 09/30/2016 08/30/2016  Decreased Interest 0 0 0 0 0 1 1  Down, Depressed, Hopeless 0 0 0 0 0 1 1  PHQ - 2 Score 0 0 0 0 0 2 2  Altered sleeping - - - - - - -  Tired, decreased energy - - - - - - -  Change in appetite - - - - - - -  Feeling bad or failure about yourself  - - - - - - -  Trouble concentrating - - - - - - -  Moving slowly or fidgety/restless - - - - - - -  Suicidal thoughts - - - - - - -  PHQ-9 Score - - - - - - -     Review of Systems  Constitutional: Negative.   HENT: Negative.   Eyes: Negative.     Respiratory: Negative.   Cardiovascular: Negative.   Gastrointestinal: Negative.   Endocrine: Negative.   Genitourinary: Negative.   Musculoskeletal: Positive for arthralgias and myalgias.  Skin: Negative.   Allergic/Immunologic: Negative.   Neurological: Negative.   Hematological: Negative.   Psychiatric/Behavioral: Negative.   All other systems reviewed and are negative.      Objective:   Physical Exam  Constitutional: She is oriented to person, place, and time. She appears well-developed and well-nourished.  HENT:  Head: Normocephalic and atraumatic.  Neck: Normal range of motion. Neck supple.  Cardiovascular: Normal rate and regular rhythm.  Pulmonary/Chest: Effort normal and breath sounds normal.  Musculoskeletal:  Normal Muscle Bulk and Muscle Testing Reveals: Upper Extremities: Full ROM and Muscle Strength 5/5 Right AC Joint Tenderness Thoracic Paraspinal Tenderness: T-7-T-9 Lower Extremities: Full ROM and Muscle Strength 5/5 Arises from Table with ease Narrow Based gait  Neurological: She is alert and oriented to person, place, and time.  Skin: Skin is warm and dry.  Psychiatric: She has a normal mood and affect. Her behavior is normal.  Nursing note and vitals reviewed.         Assessment & Plan:  1.Complex regional pain syndrome right lower extremity postoperative. She's s/p post plantar fascial release. She continue withsensitivity totouch. She has diffuse numbness and tingling. Continuecurrent medication regime withGabapentin.1114/2019 Refilled: Nucynta 100 mg one tablet four times a day. #120. We will continue the opioid monitoring program, this consists of regular clinic visits, examinations, urine drug screen, pill counts as well as use of [place] Controlled Substance reporting System. 2.Myofascial pain syndrome:Continuecurrent medication regime withTizanidine and ice, heat and exercise regime. 04/06/2018 3. Depression: Continue:  Zoloft.PCP Following.04/06/2018 4. Cervical Post Laminectomy: Continue to Monitor. 04/06/2018 5. Cervicalgia/Cervical Radiculopathy/ Bilateral Hand Pain, Left hand with tingling and burning. Continuecurrent medication regime withGabapentin. 04/06/2018 6. Peripheral Neuropathy: Continuecurrent medication regime withGabapentin: 04/06/2018 7.RightShoulder Pain: Ortho Following.Continue current medication regime and Continue to Monitor. 04/06/2018  20 minutes of face to face patient care time was spent during this visit. All questions were encouraged and answered.  F/U in 1 month

## 2018-05-08 ENCOUNTER — Encounter: Payer: BLUE CROSS/BLUE SHIELD | Attending: Physical Medicine & Rehabilitation | Admitting: Registered Nurse

## 2018-05-08 ENCOUNTER — Encounter: Payer: Self-pay | Admitting: Registered Nurse

## 2018-05-08 VITALS — BP 160/98 | HR 74 | Resp 14 | Ht 67.0 in | Wt 256.0 lb

## 2018-05-08 DIAGNOSIS — G90521 Complex regional pain syndrome I of right lower limb: Secondary | ICD-10-CM

## 2018-05-08 DIAGNOSIS — G8929 Other chronic pain: Secondary | ICD-10-CM

## 2018-05-08 DIAGNOSIS — G609 Hereditary and idiopathic neuropathy, unspecified: Secondary | ICD-10-CM

## 2018-05-08 DIAGNOSIS — M961 Postlaminectomy syndrome, not elsewhere classified: Secondary | ICD-10-CM | POA: Diagnosis not present

## 2018-05-08 DIAGNOSIS — M7918 Myalgia, other site: Secondary | ICD-10-CM

## 2018-05-08 DIAGNOSIS — Z5181 Encounter for therapeutic drug level monitoring: Secondary | ICD-10-CM | POA: Insufficient documentation

## 2018-05-08 DIAGNOSIS — R200 Anesthesia of skin: Secondary | ICD-10-CM

## 2018-05-08 DIAGNOSIS — M5412 Radiculopathy, cervical region: Secondary | ICD-10-CM | POA: Insufficient documentation

## 2018-05-08 DIAGNOSIS — M25512 Pain in left shoulder: Secondary | ICD-10-CM

## 2018-05-08 DIAGNOSIS — M79641 Pain in right hand: Secondary | ICD-10-CM

## 2018-05-08 DIAGNOSIS — M797 Fibromyalgia: Secondary | ICD-10-CM | POA: Diagnosis not present

## 2018-05-08 DIAGNOSIS — R202 Paresthesia of skin: Secondary | ICD-10-CM

## 2018-05-08 MED ORDER — TAPENTADOL HCL 100 MG PO TABS: 1.0000 | ORAL_TABLET | Freq: Four times a day (QID) | ORAL | 0 refills | Status: DC

## 2018-05-08 NOTE — Progress Notes (Signed)
Subjective:    Patient ID: Sabrina Williams, female    DOB: July 12, 1970, 47 y.o.   MRN: [id number]  HPI:  Sabrina Williams is a 47 y.o. female who returns for follow up appointment for chronic pain and medication refill.She states her pain is located in her right post surgical pain ( had trigger finger release by Dr. Jerilee Hoh on 04/07/18), left hand pain with numbness and tingling and left shoulder pain. She rates her pain 3. Her current exercise regime is walking and performing stretching exercises.  Ms. Eckmann Morphine equivalent is 160.00 MME.  Last UDS was Performed on 01/09/18, it was consistent.   Pain Inventory Average Pain 4 Pain Right Now 3 My pain is constant, sharp, burning, dull, stabbing, tingling and aching  In the last 24 hours, has pain interfered with the following? General activity 6 Relation with others 3 Enjoyment of life 4 What TIME of day is your pain at its worst? evening Sleep (in general) Fair  Pain is worse with: standing and some activites Pain improves with: rest, heat/ice, therapy/exercise and medication Relief from Meds: 6  Mobility walk without assistance how many minutes can you walk? 60+ ability to climb steps?  yes do you drive?  yes  Function not employed: date last employed . I need assistance with the following:  meal prep, household duties and shopping Do you have any goals in this area?  no  Neuro/Psych No problems in this area  Prior Studies Any changes since last visit?  no  Physicians involved in your care Any changes since last visit?  no   Family History  Problem Relation Age of Onset  . Cancer Mother        multiple myeloma  . Diabetes Father   . Kidney disease Father   . Heart disease Father    Social History   Socioeconomic History  . Marital status: Married    Spouse name: Not on file  . Number of children: Not on file  . Years of education: Not on file  . Highest education level: Not on file    Occupational History  . Not on file  Social Needs  . Financial resource strain: Not on file  . Food insecurity:    Worry: Not on file    Inability: Not on file  . Transportation needs:    Medical: Not on file    Non-medical: Not on file  Tobacco Use  . Smoking status: Never Smoker  . Smokeless tobacco: Never Used  Substance and Sexual Activity  . Alcohol use: No    Alcohol/week: 0.0 standard drinks  . Drug use: No  . Sexual activity: Not on file  Lifestyle  . Physical activity:    Days per week: Not on file    Minutes per session: Not on file  . Stress: Not on file  Relationships  . Social connections:    Talks on phone: Not on file    Gets together: Not on file    Attends religious service: Not on file    Active member of club or organization: Not on file    Attends meetings of clubs or organizations: Not on file    Relationship status: Not on file  Other Topics Concern  . Not on file  Social History Narrative  . Not on file   Past Surgical History:  Procedure Laterality Date  . CESAREAN SECTION     x2  . dental implant  July 2015  .  ENDOSCOPIC PLANTAR FASCIOTOMY Right August 2015  . LITHOTRIPSY     multiple  . SHOULDER SURGERY  6/06  . SMALL INTESTINE SURGERY     cervical laminectomy  . utereroscopy  1995   Past Medical History:  Diagnosis Date  . Chronic kidney disease    stones  . Depression   . Diabetes mellitus   . Hypertension   . Neuromuscular disorder (HCC)    BP (!) 160/98   Pulse 74   Resp 14   Ht '5\' 7"'$  (1.702 m)   Wt 256 lb (116.1 kg)   SpO2 98%   BMI 40.10 kg/m   Opioid Risk Score:   Fall Risk Score:  `1  Depression screen PHQ 2/9  Depression screen [facility] 2/9 02/17/2018 07/01/2017 03/11/2017 02/14/2017 12/03/2016 09/30/2016 08/30/2016  Decreased Interest 0 0 0 0 0 1 1  Down, Depressed, Hopeless 0 0 0 0 0 1 1  PHQ - 2 Score 0 0 0 0 0 2 2  Altered sleeping - - - - - - -  Tired, decreased energy - - - - - - -  Change in appetite - - - -  - - -  Feeling bad or failure about yourself  - - - - - - -  Trouble concentrating - - - - - - -  Moving slowly or fidgety/restless - - - - - - -  Suicidal thoughts - - - - - - -  PHQ-9 Score - - - - - - -    Review of Systems  Constitutional: Negative.   HENT: Negative.   Eyes: Negative.   Respiratory: Negative.   Cardiovascular: Negative.   Gastrointestinal: Negative.   Endocrine: Negative.   Genitourinary: Negative.   Musculoskeletal: Positive for arthralgias.  Skin: Negative.   Allergic/Immunologic: Negative.   Neurological: Negative.   Hematological: Negative.   Psychiatric/Behavioral: Negative.   All other systems reviewed and are negative.      Objective:   Physical Exam Vitals signs and nursing note reviewed.  Constitutional:      Appearance: Normal appearance.  Neck:     Musculoskeletal: Normal range of motion and neck supple.  Cardiovascular:     Rate and Rhythm: Normal rate and regular rhythm.     Pulses: Normal pulses.     Heart sounds: Normal heart sounds.  Pulmonary:     Effort: Pulmonary effort is normal.     Breath sounds: Normal breath sounds.  Musculoskeletal:     Comments: Normal Muscle Bulk and Muscle Testing Reveals:  Upper Extremities: Full ROM and Muscle Strength 5/5 Left AC Joint Tenderness  Thoracic Paraspinal Tenderness: T-1-T-3  Lower Extremities: Full ROM and Muscle Strength 5/5 Narrow Based Gait   Skin:    General: Skin is warm and dry.  Neurological:     Mental Status: She is alert and oriented to person, place, and time.  Psychiatric:        Mood and Affect: Mood normal.        Behavior: Behavior normal.           Assessment & Plan:  1.Complex regional pain syndrome right lower extremity postoperative. She's s/p post plantar fascial release. She continue withsensitivity totouch. She has diffuse numbness and tingling. Continuecurrent medication regime withGabapentin.05/08/2018 Refilled: Nucynta 100 mg one tablet four  times a day. #120. We will continue the opioid monitoring program, this consists of regular clinic visits, examinations, urine drug screen, pill counts as well as use of [place] Controlled  Substance reporting System. 2.Myofascial pain syndrome:Continuecurrent medication regime withTizanidine and ice, heat and exercise regime. 05/08/2018 3. Depression: Continue: Zoloft.PCP Following.05/08/2018 4. Cervical Post Laminectomy: Continue to Monitor.05/08/2018 5.Cervicalgia/Cervical Radiculopathy/ Bilateral Hand Pain, Left hand with tingling and burning. Continuecurrent medication regime withGabapentin. 05/08/2018 6. Peripheral Neuropathy: Continuecurrent medication regime withGabapentin:04/06/2018 7.Chronic Left Shoulder Pain:OrthoFollowing.Continue current medication regime and Continue to Monitor. 05/08/2018 8. Acute right hand pain/ Post Surgical  Procedure: Trigger Finger Release: Dr. Jerilee Hoh Following.   20 minutes of face to face patient care time was spent during this visit. All questions were encouraged and answered.  F/U in 1 month

## 2018-05-10 ENCOUNTER — Encounter: Payer: BLUE CROSS/BLUE SHIELD | Admitting: Registered Nurse

## 2018-06-06 ENCOUNTER — Encounter: Payer: BLUE CROSS/BLUE SHIELD | Admitting: Registered Nurse

## 2018-06-12 ENCOUNTER — Other Ambulatory Visit: Payer: Self-pay

## 2018-06-12 ENCOUNTER — Encounter: Payer: Self-pay | Admitting: Registered Nurse

## 2018-06-12 ENCOUNTER — Encounter: Payer: BLUE CROSS/BLUE SHIELD | Attending: Physical Medicine & Rehabilitation | Admitting: Registered Nurse

## 2018-06-12 VITALS — BP 124/85 | HR 74 | Ht 67.0 in | Wt 254.4 lb

## 2018-06-12 DIAGNOSIS — G8929 Other chronic pain: Secondary | ICD-10-CM

## 2018-06-12 DIAGNOSIS — M79641 Pain in right hand: Secondary | ICD-10-CM

## 2018-06-12 DIAGNOSIS — G609 Hereditary and idiopathic neuropathy, unspecified: Secondary | ICD-10-CM

## 2018-06-12 DIAGNOSIS — R200 Anesthesia of skin: Secondary | ICD-10-CM

## 2018-06-12 DIAGNOSIS — M961 Postlaminectomy syndrome, not elsewhere classified: Secondary | ICD-10-CM

## 2018-06-12 DIAGNOSIS — G894 Chronic pain syndrome: Secondary | ICD-10-CM | POA: Diagnosis not present

## 2018-06-12 DIAGNOSIS — Z5181 Encounter for therapeutic drug level monitoring: Secondary | ICD-10-CM | POA: Diagnosis not present

## 2018-06-12 DIAGNOSIS — M25512 Pain in left shoulder: Secondary | ICD-10-CM

## 2018-06-12 DIAGNOSIS — G90521 Complex regional pain syndrome I of right lower limb: Secondary | ICD-10-CM | POA: Diagnosis not present

## 2018-06-12 DIAGNOSIS — Z79891 Long term (current) use of opiate analgesic: Secondary | ICD-10-CM | POA: Diagnosis not present

## 2018-06-12 DIAGNOSIS — R202 Paresthesia of skin: Secondary | ICD-10-CM

## 2018-06-12 DIAGNOSIS — M5412 Radiculopathy, cervical region: Secondary | ICD-10-CM | POA: Diagnosis not present

## 2018-06-12 DIAGNOSIS — M797 Fibromyalgia: Secondary | ICD-10-CM | POA: Diagnosis not present

## 2018-06-12 DIAGNOSIS — M7918 Myalgia, other site: Secondary | ICD-10-CM

## 2018-06-12 MED ORDER — TAPENTADOL HCL 100 MG PO TABS: 1.0000 | ORAL_TABLET | Freq: Four times a day (QID) | ORAL | 0 refills | Status: DC

## 2018-06-12 NOTE — Progress Notes (Signed)
Subjective:    Patient ID: Sabrina Williams, female    DOB: August 28, 1970, 48 y.o.   MRN: [id number]  HPI: Sabrina Williams is a 48 y.o. female who returns for follow up appointment for chronic pain and medication refill. She states her pain is located in her right hand, left shoulder and right foot. She rates her pain 2. Her current exercise regime is walking.   Sabrina Williams Morphine equivalent is 160.00  MME.  Last UDS was Performed on 01/09/2018, it was consistent.   Pain Inventory Average Pain 4 Pain Right Now 2 My pain is constant, sharp, burning, dull, stabbing, tingling and aching  In the last 24 hours, has pain interfered with the following? General activity 3 Relation with others 3 Enjoyment of life 3 What TIME of day is your pain at its worst? evening Sleep (in general) Fair  Pain is worse with: walking, standing and some activites Pain improves with: rest, heat/ice, therapy/exercise and medication Relief from Meds: 7  Mobility   Function not employed: date last employed na I need assistance with the following:  meal prep, household duties and shopping  Neuro/Psych No problems in this area  Prior Studies Any changes since last visit?  no  Physicians involved in your care Any changes since last visit?  no   Family History  Problem Relation Age of Onset  . Cancer Mother        multiple myeloma  . Diabetes Father   . Kidney disease Father   . Heart disease Father    Social History   Socioeconomic History  . Marital status: Married    Spouse name: Not on file  . Number of children: Not on file  . Years of education: Not on file  . Highest education level: Not on file  Occupational History  . Not on file  Social Needs  . Financial resource strain: Not on file  . Food insecurity:    Worry: Not on file    Inability: Not on file  . Transportation needs:    Medical: Not on file    Non-medical: Not on file  Tobacco Use  . Smoking status: Never Smoker   . Smokeless tobacco: Never Used  Substance and Sexual Activity  . Alcohol use: No    Alcohol/week: 0.0 standard drinks  . Drug use: No  . Sexual activity: Not on file  Lifestyle  . Physical activity:    Days per week: Not on file    Minutes per session: Not on file  . Stress: Not on file  Relationships  . Social connections:    Talks on phone: Not on file    Gets together: Not on file    Attends religious service: Not on file    Active member of club or organization: Not on file    Attends meetings of clubs or organizations: Not on file    Relationship status: Not on file  Other Topics Concern  . Not on file  Social History Narrative  . Not on file   Past Surgical History:  Procedure Laterality Date  . CESAREAN SECTION     x2  . dental implant  July 2015  . ENDOSCOPIC PLANTAR FASCIOTOMY Right August 2015  . LITHOTRIPSY     multiple  . SHOULDER SURGERY  6/06  . SMALL INTESTINE SURGERY     cervical laminectomy  . utereroscopy  1995   Past Medical History:  Diagnosis Date  . Chronic kidney disease  stones  . Depression   . Diabetes mellitus   . Hypertension   . Neuromuscular disorder (HCC)    BP 124/85   Pulse 74   Ht '5\' 7"'$  (1.702 m)   Wt 254 lb 6.4 oz (115.4 kg)   SpO2 98%   BMI 39.84 kg/m   Opioid Risk Score:   Fall Risk Score:  `1  Depression screen PHQ 2/9  Depression screen [facility] 2/9 06/12/2018 02/17/2018 07/01/2017 03/11/2017 02/14/2017 12/03/2016 09/30/2016  Decreased Interest 0 0 0 0 0 0 1  Down, Depressed, Hopeless 0 0 0 0 0 0 1  PHQ - 2 Score 0 0 0 0 0 0 2  Altered sleeping - - - - - - -  Tired, decreased energy - - - - - - -  Change in appetite - - - - - - -  Feeling bad or failure about yourself  - - - - - - -  Trouble concentrating - - - - - - -  Moving slowly or fidgety/restless - - - - - - -  Suicidal thoughts - - - - - - -  PHQ-9 Score - - - - - - -    Review of Systems  Constitutional: Negative.   HENT: Negative.   Eyes: Negative.     Respiratory: Negative.   Cardiovascular: Negative.   Gastrointestinal: Negative.   Endocrine: Negative.   Genitourinary: Negative.   Musculoskeletal: Negative.   Skin: Negative.   Allergic/Immunologic: Negative.   Neurological: Negative.   Hematological: Negative.   Psychiatric/Behavioral: Negative.   All other systems reviewed and are negative.      Objective:   Physical Exam Vitals signs and nursing note reviewed.  Constitutional:      Appearance: Normal appearance.  Neck:     Musculoskeletal: Normal range of motion and neck supple.  Cardiovascular:     Pulses: Normal pulses.     Heart sounds: Normal heart sounds.  Pulmonary:     Effort: Pulmonary effort is normal.     Breath sounds: Normal breath sounds.  Musculoskeletal:     Comments: Normal Muscle Bulk and Muscle Testing Reveals:  Upper Extremities: Full ROM and Muscle Strength 5/5  Lower Extremities: Full ROM and Muscle Strength 5/5 Arises from chair with ease Narrow Based Gait   Skin:    General: Skin is warm and dry.  Neurological:     Mental Status: She is alert and oriented to person, place, and time.  Psychiatric:        Mood and Affect: Mood normal.        Behavior: Behavior normal.           Assessment & Plan:  1.Complex regional pain syndrome right lower extremity postoperative. She's s/p post plantar fascial release. She continue withsensitivity totouch. She has diffuse numbness and tingling. Continuecurrent medication regime withGabapentin.06/12/2018. Refilled: Nucynta 100 mg one tablet four times a day. #120. We will continue the opioid monitoring program, this consists of regular clinic visits, examinations, urine drug screen, pill counts as well as use of [place] Controlled Substance reporting System. 2.Myofascial pain syndrome:Continuecurrent medication regime withTizanidine and ice, heat and exercise regime. 06/12/2018 3. Depression: Continue: Zoloft.PCP  Following.06/12/2017 4. Cervical Post Laminectomy: Continue to Monitor.06/12/2018 5.Cervicalgia/Cervical Radiculopathy/ Bilateral Hand Pain, Left hand with tingling and burning. Continuecurrent medication regime withGabapentin. 06/12/2018 6. Peripheral Neuropathy: Continuecurrent medication regime withGabapentin:06/12/2018 7.Chronic Left Shoulder Pain:OrthoFollowing.Continue current medication regime and Continue to Monitor. 06/12/2018 8. Right hand pain/ Post Surgical  Procedure:  Trigger Finger Release: Dr. Jerilee Hoh Following. 06/12/2018.  20 minutes of face to face patient care time was spent during this visit. All questions were encouraged and answered.  F/U in 1 month

## 2018-06-15 LAB — TOXASSURE SELECT,+ANTIDEPR,UR

## 2018-06-20 ENCOUNTER — Telehealth: Payer: Self-pay | Admitting: *Deleted

## 2018-06-20 NOTE — Telephone Encounter (Signed)
Urine drug screen for this encounter is consistent for prescribed medication 

## 2018-07-10 ENCOUNTER — Other Ambulatory Visit: Payer: Self-pay

## 2018-07-10 ENCOUNTER — Encounter: Payer: Self-pay | Admitting: Registered Nurse

## 2018-07-10 ENCOUNTER — Encounter: Payer: BLUE CROSS/BLUE SHIELD | Attending: Physical Medicine & Rehabilitation | Admitting: Registered Nurse

## 2018-07-10 VITALS — BP 104/71 | HR 76 | Ht 68.0 in | Wt 255.2 lb

## 2018-07-10 DIAGNOSIS — M797 Fibromyalgia: Secondary | ICD-10-CM | POA: Diagnosis not present

## 2018-07-10 DIAGNOSIS — M5412 Radiculopathy, cervical region: Secondary | ICD-10-CM | POA: Insufficient documentation

## 2018-07-10 DIAGNOSIS — M542 Cervicalgia: Secondary | ICD-10-CM | POA: Diagnosis not present

## 2018-07-10 DIAGNOSIS — G90521 Complex regional pain syndrome I of right lower limb: Secondary | ICD-10-CM | POA: Insufficient documentation

## 2018-07-10 DIAGNOSIS — M961 Postlaminectomy syndrome, not elsewhere classified: Secondary | ICD-10-CM | POA: Diagnosis not present

## 2018-07-10 DIAGNOSIS — Z5181 Encounter for therapeutic drug level monitoring: Secondary | ICD-10-CM | POA: Diagnosis not present

## 2018-07-10 DIAGNOSIS — M25512 Pain in left shoulder: Secondary | ICD-10-CM

## 2018-07-10 DIAGNOSIS — G894 Chronic pain syndrome: Secondary | ICD-10-CM

## 2018-07-10 DIAGNOSIS — G8929 Other chronic pain: Secondary | ICD-10-CM

## 2018-07-10 DIAGNOSIS — Z79891 Long term (current) use of opiate analgesic: Secondary | ICD-10-CM

## 2018-07-10 DIAGNOSIS — M7918 Myalgia, other site: Secondary | ICD-10-CM

## 2018-07-10 MED ORDER — TAPENTADOL HCL 100 MG PO TABS: 1.0000 | ORAL_TABLET | Freq: Four times a day (QID) | ORAL | 0 refills | Status: DC

## 2018-07-10 NOTE — Progress Notes (Signed)
Subjective:    Patient ID: Sabrina Williams, female    DOB: 10-Apr-1971, 48 y.o.   MRN: [id number]  HPI: Sabrina Williams is a 48 y.o. female who returns for follow up appointment for chronic pain and medication refill. She states her pain is located in her neck radiating into her left shoulder, left arm and left hand with tingling and numbness and right lower extremity. She rates her pain 3. Her current exercise regime is walking and performing stretching exercises.  Ms. Heinrich Morphine equivalent is 160.00  MME.  Last UDS was Performed on 06/12/2018.  Pain Inventory Average Pain 4 Pain Right Now 3 My pain is constant, sharp, burning, dull, stabbing, tingling and aching  In the last 24 hours, has pain interfered with the following? General activity 5 Relation with others 3 Enjoyment of life 3 What TIME of day is your pain at its worst? evening Sleep (in general) Fair  Pain is worse with: standing and some activites Pain improves with: rest, heat/ice, therapy/exercise and medication Relief from Meds: 7  Mobility walk without assistance how many minutes can you walk? 60 ability to climb steps?  yes do you drive?  yes  Function not employed: date last employed . I need assistance with the following:  meal prep, household duties and shopping  Neuro/Psych No problems in this area  Prior Studies Any changes since last visit?  no  Physicians involved in your care Any changes since last visit?  no   Family History  Problem Relation Age of Onset  . Cancer Mother        multiple myeloma  . Diabetes Father   . Kidney disease Father   . Heart disease Father    Social History   Socioeconomic History  . Marital status: Married    Spouse name: Not on file  . Number of children: Not on file  . Years of education: Not on file  . Highest education level: Not on file  Occupational History  . Not on file  Social Needs  . Financial resource strain: Not on file  . Food  insecurity:    Worry: Not on file    Inability: Not on file  . Transportation needs:    Medical: Not on file    Non-medical: Not on file  Tobacco Use  . Smoking status: Never Smoker  . Smokeless tobacco: Never Used  Substance and Sexual Activity  . Alcohol use: No    Alcohol/week: 0.0 standard drinks  . Drug use: No  . Sexual activity: Not on file  Lifestyle  . Physical activity:    Days per week: Not on file    Minutes per session: Not on file  . Stress: Not on file  Relationships  . Social connections:    Talks on phone: Not on file    Gets together: Not on file    Attends religious service: Not on file    Active member of club or organization: Not on file    Attends meetings of clubs or organizations: Not on file    Relationship status: Not on file  Other Topics Concern  . Not on file  Social History Narrative  . Not on file   Past Surgical History:  Procedure Laterality Date  . CESAREAN SECTION     x2  . dental implant  July 2015  . ENDOSCOPIC PLANTAR FASCIOTOMY Right August 2015  . LITHOTRIPSY     multiple  . SHOULDER SURGERY  6/06  .  SMALL INTESTINE SURGERY     cervical laminectomy  . utereroscopy  1995   Past Medical History:  Diagnosis Date  . Chronic kidney disease    stones  . Depression   . Diabetes mellitus   . Hypertension   . Neuromuscular disorder (HCC)    BP 104/71   Pulse 76   Ht '5\' 8"'$  (1.727 m)   Wt 255 lb 3.2 oz (115.8 kg)   SpO2 96%   BMI 38.80 kg/m   Opioid Risk Score:   Fall Risk Score:  `1  Depression screen PHQ 2/9  Depression screen [facility] 2/9 07/10/2018 06/12/2018 02/17/2018 07/01/2017 03/11/2017 02/14/2017 12/03/2016  Decreased Interest 0 0 0 0 0 0 0  Down, Depressed, Hopeless 0 0 0 0 0 0 0  PHQ - 2 Score 0 0 0 0 0 0 0  Altered sleeping - - - - - - -  Tired, decreased energy - - - - - - -  Change in appetite - - - - - - -  Feeling bad or failure about yourself  - - - - - - -  Trouble concentrating - - - - - - -  Moving  slowly or fidgety/restless - - - - - - -  Suicidal thoughts - - - - - - -  PHQ-9 Score - - - - - - -     Review of Systems  Constitutional: Negative.   HENT: Negative.   Eyes: Negative.   Respiratory: Negative.   Cardiovascular: Negative.   Gastrointestinal: Negative.   Endocrine: Negative.   Genitourinary: Negative.   Musculoskeletal: Negative.   Skin: Negative.   Allergic/Immunologic: Negative.   Neurological: Negative.   Hematological: Negative.   Psychiatric/Behavioral: Negative.   All other systems reviewed and are negative.      Objective:   Physical Exam Vitals signs and nursing note reviewed.  Constitutional:      Appearance: Normal appearance.  Neck:     Musculoskeletal: Normal range of motion and neck supple.     Comments: Cervical Paraspinal Tenderness: C-5-C-5 Cardiovascular:     Rate and Rhythm: Normal rate and regular rhythm.     Pulses: Normal pulses.     Heart sounds: Normal heart sounds.  Pulmonary:     Effort: Pulmonary effort is normal.     Breath sounds: Normal breath sounds.  Musculoskeletal:     Comments: Normal Muscle Bulk and Muscle Testing Reveals:  Upper Extremities:Full  ROM and Muscle Strength 5/5 Left AC Joint Tenderness  Thoracic Paraspinal Tenderness: T-3-T-4 Lower Extremities: Full ROM and Muscle Strength 5/5 Arise from Table with ease Narrow Based Gait   Skin:    General: Skin is warm and dry.  Neurological:     Mental Status: She is alert and oriented to person, place, and time.  Psychiatric:        Mood and Affect: Mood normal.        Behavior: Behavior normal.           Assessment & Plan:  1.Complex regional pain syndrome right lower extremity postoperative. She's s/p post plantar fascial release. She continue withsensitivity totouch. She has diffuse numbness and tingling. Continuecurrent medication regime withGabapentin.07/10/2018. Refilled: Nucynta 100 mg one tablet four times a day. #120. We will continue  the opioid monitoring program, this consists of regular clinic visits, examinations, urine drug screen, pill counts as well as use of [place] Controlled Substance reporting System. 2.Myofascial pain syndrome:Continuecurrent medication regime withTizanidine and ice, heat and  exercise regime. 07/10/2018 3. Depression: Continue: Zoloft.PCP Following.07/10/2018 4. Cervical Post Laminectomy: Continue to Monitor.07/10/2018 5.Cervicalgia/Cervical Radiculopathy/ Bilateral Hand Pain, Left hand with tingling and burning. Continuecurrent medication regime withGabapentin. 07/10/2018 6. Peripheral Neuropathy: Continuecurrent medication regime withGabapentin:07/10/2018 7.Chronic LeftShoulder Pain:OrthoFollowing.Continue current medication regime and Continue to Monitor. 07/10/2018 8. Right hand pain/ Post Surgical Procedure: Trigger Finger Release: Dr. Jerilee Hoh Following. 07/10/2018.  20 minutes of face to face patient care time was spent during this visit. All questions were encouraged and answered.  F/U in 1 month

## 2018-07-29 ENCOUNTER — Other Ambulatory Visit: Payer: Self-pay | Admitting: Physical Medicine & Rehabilitation

## 2018-08-08 ENCOUNTER — Other Ambulatory Visit: Payer: Self-pay

## 2018-08-08 ENCOUNTER — Encounter: Payer: BLUE CROSS/BLUE SHIELD | Attending: Physical Medicine & Rehabilitation

## 2018-08-08 ENCOUNTER — Encounter: Payer: Self-pay | Admitting: Physical Medicine & Rehabilitation

## 2018-08-08 ENCOUNTER — Ambulatory Visit (HOSPITAL_BASED_OUTPATIENT_CLINIC_OR_DEPARTMENT_OTHER): Payer: BLUE CROSS/BLUE SHIELD | Admitting: Physical Medicine & Rehabilitation

## 2018-08-08 VITALS — BP 115/84 | HR 73 | Ht 67.0 in | Wt 254.0 lb

## 2018-08-08 DIAGNOSIS — M5412 Radiculopathy, cervical region: Secondary | ICD-10-CM | POA: Insufficient documentation

## 2018-08-08 DIAGNOSIS — M961 Postlaminectomy syndrome, not elsewhere classified: Secondary | ICD-10-CM

## 2018-08-08 DIAGNOSIS — Z5181 Encounter for therapeutic drug level monitoring: Secondary | ICD-10-CM | POA: Diagnosis not present

## 2018-08-08 DIAGNOSIS — M797 Fibromyalgia: Secondary | ICD-10-CM | POA: Diagnosis not present

## 2018-08-08 DIAGNOSIS — G90521 Complex regional pain syndrome I of right lower limb: Secondary | ICD-10-CM | POA: Insufficient documentation

## 2018-08-08 DIAGNOSIS — M7918 Myalgia, other site: Secondary | ICD-10-CM | POA: Diagnosis not present

## 2018-08-08 MED ORDER — TAPENTADOL HCL 100 MG PO TABS: 1.0000 | ORAL_TABLET | Freq: Four times a day (QID) | ORAL | 0 refills | Status: DC

## 2018-08-08 NOTE — Progress Notes (Signed)
Subjective:    Patient ID: Sabrina Williams, female    DOB: 08/08/1970, 48 y.o.   MRN: [id number] 48 year old female with cervical postlaminectomy syndrome with chronic radicular pain she also has history of complex regional pain syndrome type I status post tendon Achilles surgery HPI Recently seen  Nephrologist Dr Chipper Oman  Nucynta 182m QID, no constipation, patient very happy with his medicine but it is very costly for her.  Oral surgery coming up next week  Had Duprytren's contracture release, took percocet post op, patient has been released for full activities by her orthopedic surgeon.  Hasn't worked out for several months Discussed starting this again  PCP wants to switch sertraline to duloxetine, we discussed that her Nucynta has similar mode of efficacy to duloxetine for neuropathic pain.  We also discussed that because of this she may not notice any additional benefit in the switch from Cymbalta to sertraline  Talking to plastics about breast reduction surgery, has not lost a cup size since her weight reduction surgery.  She has pain in the mid to upper back.  She also complains of pressure from her bra steps or up limiting food intake Pain Inventory Average Pain 4 Pain Right Now 2 My pain is constant, sharp, burning, dull, stabbing, tingling and aching  In the last 24 hours, has pain interfered with the following? General activity 5 Relation with others 5 Enjoyment of life 5 What TIME of day is your pain at its worst? evening Sleep (in general) Fair  Pain is worse with: sitting, standing and some activites Pain improves with: rest, heat/ice, therapy/exercise and medication Relief from Meds: 7  Mobility how many minutes can you walk? 60  Function not employed: date last employed na I need assistance with the following:  meal prep, household duties and shopping  Neuro/Psych No problems in this area  Prior Studies Any changes since last visit?  no  Physicians  involved in your care Any changes since last visit?  no   Family History  Problem Relation Age of Onset  . Cancer Mother        multiple myeloma  . Diabetes Father   . Kidney disease Father   . Heart disease Father    Social History   Socioeconomic History  . Marital status: Married    Spouse name: Not on file  . Number of children: Not on file  . Years of education: Not on file  . Highest education level: Not on file  Occupational History  . Not on file  Social Needs  . Financial resource strain: Not on file  . Food insecurity:    Worry: Not on file    Inability: Not on file  . Transportation needs:    Medical: Not on file    Non-medical: Not on file  Tobacco Use  . Smoking status: Never Smoker  . Smokeless tobacco: Never Used  Substance and Sexual Activity  . Alcohol use: No    Alcohol/week: 0.0 standard drinks  . Drug use: No  . Sexual activity: Not on file  Lifestyle  . Physical activity:    Days per week: Not on file    Minutes per session: Not on file  . Stress: Not on file  Relationships  . Social connections:    Talks on phone: Not on file    Gets together: Not on file    Attends religious service: Not on file    Active member of club or organization: Not on file  Attends meetings of clubs or organizations: Not on file    Relationship status: Not on file  Other Topics Concern  . Not on file  Social History Narrative  . Not on file   Past Surgical History:  Procedure Laterality Date  . CESAREAN SECTION     x2  . dental implant  July 2015  . ENDOSCOPIC PLANTAR FASCIOTOMY Right August 2015  . LITHOTRIPSY     multiple  . SHOULDER SURGERY  6/06  . SMALL INTESTINE SURGERY     cervical laminectomy  . utereroscopy  1995   Past Medical History:  Diagnosis Date  . Chronic kidney disease    stones  . Depression   . Diabetes mellitus   . Hypertension   . Neuromuscular disorder (HCC)    BP 115/84   Pulse 73   Ht 5' 7" (1.702 m)   Wt 254 lb  (115.2 kg)   SpO2 97%   BMI 39.78 kg/m   Opioid Risk Score:   Fall Risk Score:  `1  Depression screen PHQ 2/9  Depression screen [facility] 2/9 07/10/2018 06/12/2018 02/17/2018 07/01/2017 03/11/2017 02/14/2017 12/03/2016  Decreased Interest 0 0 0 0 0 0 0  Down, Depressed, Hopeless 0 0 0 0 0 0 0  PHQ - 2 Score 0 0 0 0 0 0 0  Altered sleeping - - - - - - -  Tired, decreased energy - - - - - - -  Change in appetite - - - - - - -  Feeling bad or failure about yourself  - - - - - - -  Trouble concentrating - - - - - - -  Moving slowly or fidgety/restless - - - - - - -  Suicidal thoughts - - - - - - -  PHQ-9 Score - - - - - - -    Review of Systems  Constitutional: Negative.   HENT: Negative.   Eyes: Negative.   Respiratory: Negative.   Cardiovascular: Negative.   Gastrointestinal: Negative.   Endocrine: Negative.   Genitourinary: Negative.   Musculoskeletal: Negative.   Skin: Negative.   Allergic/Immunologic: Negative.   Neurological: Negative.   Hematological: Negative.   Psychiatric/Behavioral: Negative.   All other systems reviewed and are negative.      Objective:   Physical Exam Constitutional:      Appearance: She is obese.  HENT:     Head: Normocephalic and atraumatic.     Mouth/Throat:     Mouth: Mucous membranes are moist.     Pharynx: Oropharynx is clear.  Eyes:     Extraocular Movements: Extraocular movements intact.     Pupils: Pupils are equal, round, and reactive to light.  Neurological:     General: No focal deficit present.     Mental Status: She is alert and oriented to person, place, and time.  Psychiatric:        Mood and Affect: Mood normal.        Behavior: Behavior normal.   Posture is good Patient has mildly reduced cervical spine range of motion there is no tenderness to palpation along cervical or thoracic spine There is no tenderness along the lumbar spine. Lumbar and thoracic range of motion is normal  Tenderness Right infraspinatus Right  lower medial scapular area     Assessment & Plan:  #1.  Cervical postlaminectomy syndrome chronic radicular pain well managed with Nucynta 100 mg 4 times daily PMP reviewed no issues Last UDS 06/12/2018, appropriate 2.  Complex regional pain syndrome type I the patient has had good relief with Nucynta as above  #3.  Myofascial pain right infraspinatus as well as medial scapular border likely rhomboid or trapezius insertion this certainly could be related to her breast size and may respond to breast reduction surgery

## 2018-09-05 ENCOUNTER — Other Ambulatory Visit: Payer: Self-pay

## 2018-09-05 ENCOUNTER — Encounter: Payer: Self-pay | Admitting: Registered Nurse

## 2018-09-05 ENCOUNTER — Encounter: Payer: BLUE CROSS/BLUE SHIELD | Attending: Physical Medicine & Rehabilitation | Admitting: Registered Nurse

## 2018-09-05 VITALS — BP 139/89 | HR 79 | Ht 67.0 in | Wt 254.0 lb

## 2018-09-05 DIAGNOSIS — Z79891 Long term (current) use of opiate analgesic: Secondary | ICD-10-CM

## 2018-09-05 DIAGNOSIS — M961 Postlaminectomy syndrome, not elsewhere classified: Secondary | ICD-10-CM | POA: Diagnosis not present

## 2018-09-05 DIAGNOSIS — M542 Cervicalgia: Secondary | ICD-10-CM

## 2018-09-05 DIAGNOSIS — Z5181 Encounter for therapeutic drug level monitoring: Secondary | ICD-10-CM | POA: Insufficient documentation

## 2018-09-05 DIAGNOSIS — M797 Fibromyalgia: Secondary | ICD-10-CM | POA: Insufficient documentation

## 2018-09-05 DIAGNOSIS — M7918 Myalgia, other site: Secondary | ICD-10-CM

## 2018-09-05 DIAGNOSIS — G8929 Other chronic pain: Secondary | ICD-10-CM

## 2018-09-05 DIAGNOSIS — M25512 Pain in left shoulder: Secondary | ICD-10-CM

## 2018-09-05 DIAGNOSIS — G90521 Complex regional pain syndrome I of right lower limb: Secondary | ICD-10-CM

## 2018-09-05 DIAGNOSIS — M5412 Radiculopathy, cervical region: Secondary | ICD-10-CM | POA: Insufficient documentation

## 2018-09-05 DIAGNOSIS — G894 Chronic pain syndrome: Secondary | ICD-10-CM

## 2018-09-05 MED ORDER — MORPHINE SULFATE ER 15 MG PO TBCR: 15.0000 mg | EXTENDED_RELEASE_TABLET | Freq: Two times a day (BID) | ORAL | 0 refills | Status: DC

## 2018-09-05 MED ORDER — OXYCODONE HCL 10 MG PO TABS: 10.0000 mg | ORAL_TABLET | Freq: Every day | ORAL | 0 refills | Status: DC | PRN

## 2018-09-05 NOTE — Progress Notes (Signed)
Subjective:    Patient ID: Sabrina Williams, female    DOB: 12/17/1970, 48 y.o.   MRN: [id number]  HPI: [name] is a 48 y.o. female her appointment was changed, due to national recommendations of social distancing due to [place] 19, an audio/video telehealth visit is felt to be most appropriate for this patient at this time.  See Chart message from today for the patient's consent to telehealth from [place].     She states her pain is located in her neck radiating into her left shoulder and left arm with tingling and burning. Also reports left shoulder pain and  right lower extremity pain. She rates her pain 3. Her current exercise regime is walking.   [name] Morphine equivalent is 160.00MME.  Last UDS was Performed on 06/12/2018, it was consistent.   [name] reports her husband is on  Furlough, they were paying for the Tapentadol $1200.00 a month, they are unable to pay for her Tapentadol at this time due to financial hardship. Due to her above situation we will change her medication. She was instructed to decrease her Tapentadol to three times a day and we will prescribe MS Contin and Oxycodone. Reviewed the above with [name], she verbalizes understanding. Placed a call to [facility] and explained the above. PA submitted for MS Contin.  Spent time educating [name] on medication changes with the hope to decrease her risk of withdrawal, she verbalizes understanding.   [name] CMA asked the Health and History Questions. This provider and [name] verified we were  speaking with the correct person using two identifiers.  Pain Inventory Average Pain 3 Pain Right Now 3 My pain is constant, sharp, burning, dull, stabbing, tingling and aching  In the last 24 hours, has pain interfered with the following? General activity 7 Relation with others 3 Enjoyment of life 4 What TIME of day is your pain at its worst? evening Sleep  (in general) Fair  Pain is worse with: standing and some activites Pain improves with: rest, heat/ice, therapy/exercise and medication Relief from Meds: 7  Mobility walk without assistance how many minutes can you walk? 45 ability to climb steps?  yes do you drive?  yes  Function not employed: date last employed .  Neuro/Psych weakness numbness anxiety  Prior Studies Any changes since last visit?  no  Physicians involved in your care Any changes since last visit?  no   Family History  Problem Relation Age of Onset  . Cancer Mother        multiple myeloma  . Diabetes Father   . Kidney disease Father   . Heart disease Father    Social History   Socioeconomic History  . Marital status: Married    Spouse name: Not on file  . Number of children: Not on file  . Years of education: Not on file  . Highest education level: Not on file  Occupational History  . Not on file  Social Needs  . Financial resource strain: Not on file  . Food insecurity:    Worry: Not on file    Inability: Not on file  . Transportation needs:    Medical: Not on file    Non-medical: Not on file  Tobacco Use  . Smoking status: Never Smoker  . Smokeless tobacco: Never Used  Substance and Sexual Activity  . Alcohol use: No    Alcohol/week: 0.0 standard drinks  . Drug  use: No  . Sexual activity: Not on file  Lifestyle  . Physical activity:    Days per week: Not on file    Minutes per session: Not on file  . Stress: Not on file  Relationships  . Social connections:    Talks on phone: Not on file    Gets together: Not on file    Attends religious service: Not on file    Active member of club or organization: Not on file    Attends meetings of clubs or organizations: Not on file    Relationship status: Not on file  Other Topics Concern  . Not on file  Social History Narrative  . Not on file   Past Surgical History:  Procedure Laterality Date  . CESAREAN SECTION     x2  .  dental implant  July 2015  . ENDOSCOPIC PLANTAR FASCIOTOMY Right August 2015  . LITHOTRIPSY     multiple  . SHOULDER SURGERY  6/06  . SMALL INTESTINE SURGERY     cervical laminectomy  . utereroscopy  1995   Past Medical History:  Diagnosis Date  . Chronic kidney disease    stones  . Depression   . Diabetes mellitus   . Hypertension   . Neuromuscular disorder (HCC)    BP 139/89 Comment: Home device  Pulse 79 Comment: home device  Ht _0  (1.702 m)   Wt 254 lb (115.2 kg)   BMI 39.78 kg/m   Opioid Risk Score:   Fall Risk Score:  `1  Depression screen PHQ 2/9  Depression screen [facility] 2/9 07/10/2018 06/12/2018 02/17/2018 07/01/2017 03/11/2017 02/14/2017 12/03/2016  Decreased Interest 0 0 0 0 0 0 0  Down, Depressed, Hopeless 0 0 0 0 0 0 0  PHQ - 2 Score 0 0 0 0 0 0 0  Altered sleeping - - - - - - -  Tired, decreased energy - - - - - - -  Change in appetite - - - - - - -  Feeling bad or failure about yourself  - - - - - - -  Trouble concentrating - - - - - - -  Moving slowly or fidgety/restless - - - - - - -  Suicidal thoughts - - - - - - -  PHQ-9 Score - - - - - - -    Review of Systems  Constitutional: Negative.   HENT: Negative.   Eyes: Negative.   Respiratory: Negative.   Endocrine: Negative.   Genitourinary: Negative.   Musculoskeletal: Positive for arthralgias and neck pain.  Skin: Negative.   Allergic/Immunologic: Negative.   Neurological: Positive for weakness and numbness.  Hematological: Negative.   Psychiatric/Behavioral: The patient is nervous/anxious.   All other systems reviewed and are negative.      Objective:   Physical Exam Vitals signs and nursing note reviewed.  Musculoskeletal:     Comments: No Physical Exam Performed: Virtual Visit.   Neurological:     Mental Status: She is oriented to person, place, and time.           Assessment & Plan:  1.Complex regional pain syndrome right lower extremity postoperative. She's s/p post plantar  fascial release. She continue withsensitivity totouch. She has diffuse numbness and tingling. Continuecurrent medication regime withGabapentin.09/05/2018. RX: Ms Contin 15 mg every 12 hours #60 and Oxycodone 10 mg one tablet 5 times daily as needed for pain #150. Decreased  Nucynta to three times a day as needed for pain, and  discontinued   Nucynta 100 mg one tablet four times a day. SEE HPI.  We will continue the opioid monitoring program, this consists of regular clinic visits, examinations, urine drug screen, pill counts as well as use of [place] Controlled Substance reporting System. 2.Myofascial pain syndrome:Continuecurrent medication regime withTizanidine and ice, heat and exercise regime. 09/05/2018 3. Depression: Continue: Zoloft.PCP Following.09/05/2018 4. Cervical Post Laminectomy: Continue to Monitor.09/05/2018 5.Cervicalgia/Cervical Radiculopathy/ Bilateral Hand Pain, Left hand with tingling and burning. Continuecurrent medication regime withGabapentin. 09/05/2018 6. Peripheral Neuropathy: Continuecurrent medication regime withGabapentin:09/05/2018 7.Chronic LeftShoulder Pain:OrthoFollowing.Continue current medication regime and Continue to Monitor. 09/05/2018 8. Right hand pain/ Post Surgical Procedure:  No complaints today. Trigger Finger Release: Dr. Jerilee Hoh Following.09/05/2018.  F/U in 1 month Webex Location of patient: In her home Location of provider: Office Established patient Time spent on call: 30 minutes

## 2018-09-06 ENCOUNTER — Encounter: Payer: Self-pay | Admitting: Registered Nurse

## 2018-09-11 ENCOUNTER — Other Ambulatory Visit: Payer: Self-pay | Admitting: Registered Nurse

## 2018-10-03 ENCOUNTER — Encounter: Payer: Self-pay | Admitting: Registered Nurse

## 2018-10-03 ENCOUNTER — Encounter: Payer: BLUE CROSS/BLUE SHIELD | Attending: Physical Medicine & Rehabilitation | Admitting: Registered Nurse

## 2018-10-03 ENCOUNTER — Other Ambulatory Visit: Payer: Self-pay

## 2018-10-03 VITALS — BP 117/81 | HR 69 | Ht 68.0 in | Wt 250.0 lb

## 2018-10-03 DIAGNOSIS — M7918 Myalgia, other site: Secondary | ICD-10-CM

## 2018-10-03 DIAGNOSIS — M961 Postlaminectomy syndrome, not elsewhere classified: Secondary | ICD-10-CM | POA: Diagnosis not present

## 2018-10-03 DIAGNOSIS — M797 Fibromyalgia: Secondary | ICD-10-CM | POA: Insufficient documentation

## 2018-10-03 DIAGNOSIS — Z5181 Encounter for therapeutic drug level monitoring: Secondary | ICD-10-CM | POA: Insufficient documentation

## 2018-10-03 DIAGNOSIS — M5412 Radiculopathy, cervical region: Secondary | ICD-10-CM | POA: Insufficient documentation

## 2018-10-03 DIAGNOSIS — G894 Chronic pain syndrome: Secondary | ICD-10-CM

## 2018-10-03 DIAGNOSIS — G90521 Complex regional pain syndrome I of right lower limb: Secondary | ICD-10-CM | POA: Insufficient documentation

## 2018-10-03 DIAGNOSIS — M542 Cervicalgia: Secondary | ICD-10-CM

## 2018-10-03 DIAGNOSIS — Z79891 Long term (current) use of opiate analgesic: Secondary | ICD-10-CM

## 2018-10-03 NOTE — Progress Notes (Signed)
Subjective:    Patient ID: Sabrina Williams, female    DOB: Jun 11, 1970, 48 y.o.   MRN: [id number]  HPI: Sabrina Williams is a 48 y.o. female her appointment was changed, due to national recommendations of social distancing due to [place] 19, an audio/video telehealth visit is felt to be most appropriate for this patient at this time.  See Chart message from today for the patient's consent to telehealth from [place].     She states her pain is located in her neck radiating into her left shoulder and left hand with tingling and numbness and right foot pain with tingling. She rates her  pain 3. Her current exercise regime is walking.  Sabrina Williams Morphine equivalent is 105.00 MME.  Last UDs was performed on 06/12/2018, it was consistent. We will begin slow weaning of MS Contin and decrease her Oxycodone to 4 times a day as needed for pain, with a goal to decrease to TID as needed for pain, she verbalizes understanding. No prescription e-scribe today, Sabrina Williams was instructed to call or send My-Chart message on Friday to evaluate medication changes, she verbalizes understanding.   Sabrina Williams CMA asked the Health and History Questions. This provider and Sabrina Williams verified we were  speaking with the correct person using two identifiers.  Pain Inventory Average Pain 3 Pain Right Now 3 My pain is dull and aching  In the last 24 hours, has pain interfered with the following? General activity 3 Relation with others 3 Enjoyment of life 3 What TIME of day is your pain at its worst? evening Sleep (in general) Fair  Pain is worse with: some activites Pain improves with: rest, heat/ice, therapy/exercise and medication Relief from Meds: 7  Mobility walk without assistance how many minutes can you walk? 60 ability to climb steps?  yes do you drive?  yes  Function not employed: date last employed . I need assistance with the following:  meal prep,  household duties and shopping  Neuro/Psych No problems in this area  Prior Studies Any changes since last visit?  no  Physicians involved in your care Any changes since last visit?  no   Family History  Problem Relation Age of Onset  . Cancer Mother        multiple myeloma  . Diabetes Father   . Kidney disease Father   . Heart disease Father    Social History   Socioeconomic History  . Marital status: Married    Spouse name: Not on file  . Number of children: Not on file  . Years of education: Not on file  . Highest education level: Not on file  Occupational History  . Not on file  Social Needs  . Financial resource strain: Not on file  . Food insecurity:    Worry: Not on file    Inability: Not on file  . Transportation needs:    Medical: Not on file    Non-medical: Not on file  Tobacco Use  . Smoking status: Never Smoker  . Smokeless tobacco: Never Used  Substance and Sexual Activity  . Alcohol use: No    Alcohol/week: 0.0 standard drinks  . Drug use: No  . Sexual activity: Not on file  Lifestyle  . Physical activity:    Days per week: Not on file    Minutes per session: Not on file  . Stress: Not on file  Relationships  . Social connections:  Talks on phone: Not on file    Gets together: Not on file    Attends religious service: Not on file    Active member of club or organization: Not on file    Attends meetings of clubs or organizations: Not on file    Relationship status: Not on file  Other Topics Concern  . Not on file  Social History Narrative  . Not on file   Past Surgical History:  Procedure Laterality Date  . CESAREAN SECTION     x2  . dental implant  July 2015  . ENDOSCOPIC PLANTAR FASCIOTOMY Right August 2015  . LITHOTRIPSY     multiple  . SHOULDER SURGERY  6/06  . SMALL INTESTINE SURGERY     cervical laminectomy  . utereroscopy  1995   Past Medical History:  Diagnosis Date  . Chronic kidney disease    stones  . Depression    . Diabetes mellitus   . Hypertension   . Neuromuscular disorder ([place])    There were no vitals taken for this visit.  Opioid Risk Score:   Fall Risk Score:  `1  Depression screen PHQ 2/9  Depression screen [facility] 2/9 07/10/2018 06/12/2018 02/17/2018 07/01/2017 03/11/2017 02/14/2017 12/03/2016  Decreased Interest 0 0 0 0 0 0 0  Down, Depressed, Hopeless 0 0 0 0 0 0 0  PHQ - 2 Score 0 0 0 0 0 0 0  Altered sleeping - - - - - - -  Tired, decreased energy - - - - - - -  Change in appetite - - - - - - -  Feeling bad or failure about yourself  - - - - - - -  Trouble concentrating - - - - - - -  Moving slowly or fidgety/restless - - - - - - -  Suicidal thoughts - - - - - - -  PHQ-9 Score - - - - - - -     Review of Systems  Constitutional: Negative.   HENT: Negative.   Eyes: Negative.   Respiratory: Negative.   Cardiovascular: Negative.   Gastrointestinal: Negative.   Endocrine: Negative.   Genitourinary: Negative.   Musculoskeletal: Positive for arthralgias, back pain and myalgias.  Skin: Negative.   Allergic/Immunologic: Negative.   Neurological: Negative.   Hematological: Negative.   Psychiatric/Behavioral: Negative.   All other systems reviewed and are negative.      Objective:   Physical Exam Vitals signs and nursing note reviewed.  Musculoskeletal:     Comments: No Physical Exam Perform: Virtual Visit  Neurological:     Mental Status: She is oriented to person, place, and time.           Assessment & Plan:  1.Complex regional pain syndrome right lower extremity postoperative. She's s/p post plantar fascial release. She continue withsensitivity totouch. She has diffuse numbness and tingling. Continuecurrent medication regime withGabapentin.10/03/2018. Began Slow weaning: Goal to Decreased to HS dose  Ms Contin 15 mg  and Decreased Oxycodone 10 mg one tablet 4 times daily as needed for pain, with a goal to decrease to TID as needed for pain. We will continue the  opioid monitoring program, this consists of regular clinic visits, examinations, urine drug screen, pill counts as well as use of [place] Controlled Substance reporting System. 2.Myofascial pain syndrome:Continuecurrent medication regime withTizanidine and ice, heat and exercise regime. 10/03/2018 3. Depression: Continue: Zoloft.PCP Following.10/03/2018 4. Cervical Post Laminectomy: Continue to Monitor.10/03/2018 5.Cervicalgia/Cervical Radiculopathy/ Bilateral Hand Pain, Left hand with  tingling and burning. Continuecurrent medication regime withGabapentin. 10/03/2018 6. Peripheral Neuropathy: Continuecurrent medication regime withGabapentin:10/03/2018 7.Chronic LeftShoulder Pain:OrthoFollowing.Continue current medication regime and Continue to Monitor. 10/03/2018 8. Right hand pain/ Post Surgical Procedure:  No complaints today. Trigger Finger Release: Dr. Jerilee Hoh Following.10/03/2018.  F/U in 1 month Webex Location of patient: In her Home Location of provider: Office Established patient Time spent on call: 15 Minutes

## 2018-10-06 ENCOUNTER — Telehealth: Payer: Self-pay

## 2018-10-06 MED ORDER — MORPHINE SULFATE ER 15 MG PO TBCR: 15.0000 mg | EXTENDED_RELEASE_TABLET | Freq: Two times a day (BID) | ORAL | 0 refills | Status: DC

## 2018-10-06 MED ORDER — OXYCODONE HCL 10 MG PO TABS: 10.0000 mg | ORAL_TABLET | Freq: Every day | ORAL | 0 refills | Status: DC | PRN

## 2018-10-06 NOTE — Telephone Encounter (Signed)
Return Ms. Balli call,  She was following instructions of weaning MS Contin developed GI distress, she has resumed her every 12 hour dosage. We will continue to monitor. MS Contin was filled on 09/11/18. She is tolerating the slow weaning Oxycodone 4 times a day as needed for pain. Medication e-scribe, Ms. Biggar verbalizes understanding.

## 2018-10-06 NOTE — Telephone Encounter (Signed)
Patient called stating she was to call back today to f/u on how medication doing. Patient states she want to go back to 2 Morphine a day instead of 1, having N/V same thing happened with Nucynta. Four Oxycodone a day is working fine.

## 2018-11-07 ENCOUNTER — Encounter: Payer: 59 | Attending: Physical Medicine & Rehabilitation | Admitting: Registered Nurse

## 2018-11-07 ENCOUNTER — Encounter: Payer: Self-pay | Admitting: Registered Nurse

## 2018-11-07 ENCOUNTER — Other Ambulatory Visit: Payer: Self-pay

## 2018-11-07 VITALS — BP 108/70 | HR 80 | Temp 97.9°F | Resp 16 | Wt 260.0 lb

## 2018-11-07 DIAGNOSIS — Z79891 Long term (current) use of opiate analgesic: Secondary | ICD-10-CM

## 2018-11-07 DIAGNOSIS — M7918 Myalgia, other site: Secondary | ICD-10-CM

## 2018-11-07 DIAGNOSIS — Z5181 Encounter for therapeutic drug level monitoring: Secondary | ICD-10-CM

## 2018-11-07 DIAGNOSIS — G8929 Other chronic pain: Secondary | ICD-10-CM

## 2018-11-07 DIAGNOSIS — G894 Chronic pain syndrome: Secondary | ICD-10-CM

## 2018-11-07 DIAGNOSIS — G90521 Complex regional pain syndrome I of right lower limb: Secondary | ICD-10-CM | POA: Diagnosis not present

## 2018-11-07 DIAGNOSIS — M65331 Trigger finger, right middle finger: Secondary | ICD-10-CM

## 2018-11-07 DIAGNOSIS — M5412 Radiculopathy, cervical region: Secondary | ICD-10-CM | POA: Diagnosis not present

## 2018-11-07 DIAGNOSIS — M25512 Pain in left shoulder: Secondary | ICD-10-CM

## 2018-11-07 DIAGNOSIS — M797 Fibromyalgia: Secondary | ICD-10-CM | POA: Diagnosis not present

## 2018-11-07 DIAGNOSIS — M961 Postlaminectomy syndrome, not elsewhere classified: Secondary | ICD-10-CM | POA: Diagnosis not present

## 2018-11-07 DIAGNOSIS — M79671 Pain in right foot: Secondary | ICD-10-CM

## 2018-11-07 DIAGNOSIS — M542 Cervicalgia: Secondary | ICD-10-CM | POA: Diagnosis not present

## 2018-11-07 MED ORDER — MORPHINE SULFATE ER 15 MG PO TBCR: 15.0000 mg | EXTENDED_RELEASE_TABLET | Freq: Two times a day (BID) | ORAL | 0 refills | Status: DC

## 2018-11-07 MED ORDER — OXYCODONE HCL 10 MG PO TABS: 10.0000 mg | ORAL_TABLET | Freq: Four times a day (QID) | ORAL | 0 refills | Status: DC | PRN

## 2018-11-07 NOTE — Progress Notes (Signed)
Subjective:    Patient ID: Sabrina Williams, female    DOB: 07/01/1970, 48 y.o.   MRN: [id number]  HPI: Sabrina Williams is a 48 y.o. female who returns for follow up appointment for chronic pain and medication refill. She states her pain is located in her neck radiating into her left shoulder, left arm and left hand she describes the pain as a  Achy pain. Also reports right middle finger pain, right middle toe and right foot pain. She states last week she stumped her right middle toe, denies falling. She rates her pain 3. Her current exercise regime is walking.  Sabrina Williams Morphine equivalent is 105.00  MME.  Last UDS was Performed on 06/12/2018, it was consistent.   Pain Inventory Average Pain 4 Pain Right Now 3 My pain is sharp, burning, dull and aching  In the last 24 hours, has pain interfered with the following? General activity 5 Relation with others 4 Enjoyment of life 5 What TIME of day is your pain at its worst? evening Sleep (in general) Fair  Pain is worse with: walking, standing and some activites Pain improves with: rest, heat/ice, therapy/exercise and medication Relief from Meds: 7  Mobility walk without assistance ability to climb steps?  yes  Function No employed Need assistant with meal prep, house duties, shopping.  Neuro/Psych No problems in this area  Prior Studies n/a  Physicians involved in your care n/a   Family History  Problem Relation Age of Onset  . Cancer Mother        multiple myeloma  . Diabetes Father   . Kidney disease Father   . Heart disease Father    Social History   Socioeconomic History  . Marital status: Married    Spouse name: Not on file  . Number of children: Not on file  . Years of education: Not on file  . Highest education level: Not on file  Occupational History  . Not on file  Social Needs  . Financial resource strain: Not on file  . Food insecurity    Worry: Not on file    Inability: Not on file  .  Transportation needs    Medical: Not on file    Non-medical: Not on file  Tobacco Use  . Smoking status: Never Smoker  . Smokeless tobacco: Never Used  Substance and Sexual Activity  . Alcohol use: No    Alcohol/week: 0.0 standard drinks  . Drug use: No  . Sexual activity: Not on file  Lifestyle  . Physical activity    Days per week: Not on file    Minutes per session: Not on file  . Stress: Not on file  Relationships  . Social Herbalist on phone: Not on file    Gets together: Not on file    Attends religious service: Not on file    Active member of club or organization: Not on file    Attends meetings of clubs or organizations: Not on file    Relationship status: Not on file  Other Topics Concern  . Not on file  Social History Narrative  . Not on file   Past Surgical History:  Procedure Laterality Date  . CESAREAN SECTION     x2  . dental implant  July 2015  . ENDOSCOPIC PLANTAR FASCIOTOMY Right August 2015  . LITHOTRIPSY     multiple  . SHOULDER SURGERY  6/06  . SMALL INTESTINE SURGERY  cervical laminectomy  . utereroscopy  1995   Past Medical History:  Diagnosis Date  . Chronic kidney disease    stones  . Depression   . Diabetes mellitus   . Hypertension   . Neuromuscular disorder (HCC)    BP 108/70   Pulse 80   Temp 97.9 F (36.6 C)   Resp 16   Wt 260 lb (117.9 kg)   SpO2 95%   BMI 39.53 kg/m   Opioid Risk Score:   Fall Risk Score:  `1  Depression screen PHQ 2/9  Depression screen [facility] 2/9 07/10/2018 06/12/2018 02/17/2018 07/01/2017 03/11/2017 02/14/2017 12/03/2016  Decreased Interest 0 0 0 0 0 0 0  Down, Depressed, Hopeless 0 0 0 0 0 0 0  PHQ - 2 Score 0 0 0 0 0 0 0  Altered sleeping - - - - - - -  Tired, decreased energy - - - - - - -  Change in appetite - - - - - - -  Feeling bad or failure about yourself  - - - - - - -  Trouble concentrating - - - - - - -  Moving slowly or fidgety/restless - - - - - - -  Suicidal thoughts - - -  - - - -  PHQ-9 Score - - - - - - -      Review of Systems     Objective:   Physical Exam Vitals signs and nursing note reviewed.  Constitutional:      Appearance: Normal appearance. She is obese.  Neck:     Musculoskeletal: Normal range of motion and neck supple.  Cardiovascular:     Rate and Rhythm: Normal rate and regular rhythm.     Pulses: Normal pulses.     Heart sounds: Normal heart sounds.  Pulmonary:     Effort: Pulmonary effort is normal.     Breath sounds: Normal breath sounds.  Musculoskeletal:     Comments: Normal Muscle Bulk and Muscle Testing Reveals:  Upper Extremities: Full ROM and Muscle Strength 5/5 Left AC Joint Tenderness Lower Extremities: Full ROM and Muscle Strength 5/5 Arises from Table with ease Narrow Based  Gait   Skin:    General: Skin is warm and dry.  Neurological:     Mental Status: She is alert and oriented to person, place, and time.  Psychiatric:        Mood and Affect: Mood normal.        Behavior: Behavior normal.           Assessment & Plan:  1.Complex regional pain syndrome right lower extremity postoperative. She's s/p post plantar fascial release. She continue withsensitivity totouch. She has diffuse numbness and tingling. Continuecurrent medication regime withGabapentin.11/07/2018. Began Slow weaning: Refilled Ms Contin 15 mg one tablet every 12 hours #60  and Oxycodone 10 mg one tablet 4 times daily as needed for pain #120. We will continue the opioid monitoring program, this consists of regular clinic visits, examinations, urine drug screen, pill counts as well as use of [place] Controlled Substance reporting System. 2.Myofascial pain syndrome:Continuecurrent medication regime withTizanidine and ice, heat and exercise regime. 11/07/2018 3. Depression: Continue: Zoloft.PCP Following.11/07/2018 4. Cervical Post Laminectomy: Continue to Monitor.11/07/2018 5.Cervicalgia/Cervical Radiculopathy/ Bilateral  Hand Pain, Left hand with tingling and burning. Continuecurrent medication regime withGabapentin. 11/07/2018 6. Peripheral Neuropathy: Continuecurrent medication regime withGabapentin:11/07/2018 7.Chronic LeftShoulder Pain:OrthoFollowing.Continue current medication regime and Continue to Monitor. 10/03/2018 8. Right hand pain/ Post Surgical Procedure: .Trigger Finger Release: Dr.  Jerilee Hoh Following.11/07/2018.  15 minutes of face to face patient care time was spent during this visit. All questions were encouraged and answered.  F/U in 1 month

## 2018-12-06 ENCOUNTER — Encounter: Payer: 59 | Admitting: Registered Nurse

## 2018-12-14 ENCOUNTER — Encounter: Payer: 59 | Attending: Physical Medicine & Rehabilitation | Admitting: Registered Nurse

## 2018-12-14 ENCOUNTER — Encounter: Payer: Self-pay | Admitting: Registered Nurse

## 2018-12-14 ENCOUNTER — Other Ambulatory Visit: Payer: Self-pay

## 2018-12-14 VITALS — BP 113/81 | HR 100 | Temp 97.7°F | Ht 67.0 in | Wt 266.0 lb

## 2018-12-14 DIAGNOSIS — G90521 Complex regional pain syndrome I of right lower limb: Secondary | ICD-10-CM | POA: Diagnosis not present

## 2018-12-14 DIAGNOSIS — G8929 Other chronic pain: Secondary | ICD-10-CM

## 2018-12-14 DIAGNOSIS — M25512 Pain in left shoulder: Secondary | ICD-10-CM

## 2018-12-14 DIAGNOSIS — M7918 Myalgia, other site: Secondary | ICD-10-CM | POA: Diagnosis not present

## 2018-12-14 DIAGNOSIS — R202 Paresthesia of skin: Secondary | ICD-10-CM

## 2018-12-14 DIAGNOSIS — M5412 Radiculopathy, cervical region: Secondary | ICD-10-CM

## 2018-12-14 DIAGNOSIS — M961 Postlaminectomy syndrome, not elsewhere classified: Secondary | ICD-10-CM | POA: Diagnosis not present

## 2018-12-14 DIAGNOSIS — G894 Chronic pain syndrome: Secondary | ICD-10-CM

## 2018-12-14 DIAGNOSIS — R200 Anesthesia of skin: Secondary | ICD-10-CM

## 2018-12-14 DIAGNOSIS — M542 Cervicalgia: Secondary | ICD-10-CM

## 2018-12-14 DIAGNOSIS — M797 Fibromyalgia: Secondary | ICD-10-CM | POA: Diagnosis not present

## 2018-12-14 DIAGNOSIS — Z79891 Long term (current) use of opiate analgesic: Secondary | ICD-10-CM

## 2018-12-14 DIAGNOSIS — Z5181 Encounter for therapeutic drug level monitoring: Secondary | ICD-10-CM

## 2018-12-14 MED ORDER — MORPHINE SULFATE ER 15 MG PO TBCR: 15.0000 mg | EXTENDED_RELEASE_TABLET | Freq: Every day | ORAL | 0 refills | Status: DC | PRN

## 2018-12-14 MED ORDER — OXYCODONE HCL 10 MG PO TABS: 10.0000 mg | ORAL_TABLET | Freq: Four times a day (QID) | ORAL | 0 refills | Status: DC | PRN

## 2018-12-14 NOTE — Patient Instructions (Signed)
Increase Gabapentin as instructed and call or send My Chart message in two weeks to evaluate medication change

## 2018-12-14 NOTE — Progress Notes (Signed)
Subjective:    Patient ID: Sabrina Williams, female    DOB: 1970-08-23, 48 y.o.   MRN: [id number]  HPI: Sabrina Williams is a 48 y.o. female who returns for follow up appointment for chronic pain and medication refill. She states her pain is located in her neck, left shoulder pain radiating into her left arm and left hand with tingling and burning, right lower extremity pain with tingling and burning and right foot pain. Also reports right knee pain. Also reports increase intensity and frequency of neuropathic pain since her Nucynta was discontinued due to financial hardship. She was instructed to increase her gabapentin and call or send My-Chart message in two weeks for medication change evaluation, she verbalizes understanding. She rates her pain 4. Her current exercise regime is walking and performing stretching exercises.  Ms. Maclellan Morphine equivalent is 90.00  MME. Last UDS was Performed on 06/12/2018, it was consistent.    Pain Inventory Average Pain 5 Pain Right Now 4 My pain is constant, sharp, burning, dull, stabbing, tingling and aching  In the last 24 hours, has pain interfered with the following? General activity 7 Relation with others 3 Enjoyment of life 4 What TIME of day is your pain at its worst? evening Sleep (in general) Fair  Pain is worse with: standing and some activites Pain improves with: rest, heat/ice, therapy/exercise and medication Relief from Meds: 7  Mobility ability to climb steps?  yes do you drive?  yes  Function what is your job? . I need assistance with the following:  meal prep, household duties and shopping  Neuro/Psych tremor spasms  Prior Studies Any changes since last visit?  no  Physicians involved in your care Any changes since last visit?  no   Family History  Problem Relation Age of Onset  . Cancer Mother        multiple myeloma  . Diabetes Father   . Kidney disease Father   . Heart disease Father    Social History    Socioeconomic History  . Marital status: Married    Spouse name: Not on file  . Number of children: Not on file  . Years of education: Not on file  . Highest education level: Not on file  Occupational History  . Not on file  Social Needs  . Financial resource strain: Not on file  . Food insecurity    Worry: Not on file    Inability: Not on file  . Transportation needs    Medical: Not on file    Non-medical: Not on file  Tobacco Use  . Smoking status: Never Smoker  . Smokeless tobacco: Never Used  Substance and Sexual Activity  . Alcohol use: No    Alcohol/week: 0.0 standard drinks  . Drug use: No  . Sexual activity: Not on file  Lifestyle  . Physical activity    Days per week: Not on file    Minutes per session: Not on file  . Stress: Not on file  Relationships  . Social Herbalist on phone: Not on file    Gets together: Not on file    Attends religious service: Not on file    Active member of club or organization: Not on file    Attends meetings of clubs or organizations: Not on file    Relationship status: Not on file  Other Topics Concern  . Not on file  Social History Narrative  . Not on file  Past Surgical History:  Procedure Laterality Date  . CESAREAN SECTION     x2  . dental implant  July 2015  . ENDOSCOPIC PLANTAR FASCIOTOMY Right August 2015  . LITHOTRIPSY     multiple  . SHOULDER SURGERY  6/06  . SMALL INTESTINE SURGERY     cervical laminectomy  . utereroscopy  1995   Past Medical History:  Diagnosis Date  . Chronic kidney disease    stones  . Depression   . Diabetes mellitus   . Hypertension   . Neuromuscular disorder (HCC)    BP 113/81   Pulse 100   Temp 97.7 F (36.5 C)   Ht '5\' 7"'$  (1.702 m)   Wt 266 lb (120.7 kg)   SpO2 95%   BMI 41.66 kg/m   Opioid Risk Score:   Fall Risk Score:  `1  Depression screen PHQ 2/9  Depression screen [facility] 2/9 07/10/2018 06/12/2018 02/17/2018 07/01/2017 03/11/2017 02/14/2017 12/03/2016   Decreased Interest 0 0 0 0 0 0 0  Down, Depressed, Hopeless 0 0 0 0 0 0 0  PHQ - 2 Score 0 0 0 0 0 0 0  Altered sleeping - - - - - - -  Tired, decreased energy - - - - - - -  Change in appetite - - - - - - -  Feeling bad or failure about yourself  - - - - - - -  Trouble concentrating - - - - - - -  Moving slowly or fidgety/restless - - - - - - -  Suicidal thoughts - - - - - - -  PHQ-9 Score - - - - - - -     Review of Systems  Constitutional: Negative.   HENT: Negative.   Eyes: Negative.   Respiratory: Negative.   Cardiovascular: Negative.   Gastrointestinal: Negative.   Endocrine: Negative.   Genitourinary: Negative.   Musculoskeletal: Positive for arthralgias, back pain and myalgias.  Skin: Negative.   Allergic/Immunologic: Negative.   Neurological: Positive for tremors.  Hematological: Negative.   Psychiatric/Behavioral: Negative.   All other systems reviewed and are negative.      Objective:   Physical Exam Vitals signs and nursing note reviewed.  Constitutional:      Appearance: Normal appearance. She is obese.  Neck:     Musculoskeletal: Normal range of motion and neck supple.  Cardiovascular:     Rate and Rhythm: Normal rate and regular rhythm.     Pulses: Normal pulses.     Heart sounds: Normal heart sounds.  Pulmonary:     Effort: Pulmonary effort is normal.     Breath sounds: Normal breath sounds.  Musculoskeletal:     Comments: Normal Muscle Bulk and Muscle Testing Reveals:  Upper Extremities: Full ROM and Muscle Strength 5/5 Left AC Joint Tenderness Lower Extremities: Full ROM and Muscle Strength 5/5 Arises from Table with ease Narrow Based Gait   Skin:    General: Skin is warm and dry.  Neurological:     Mental Status: She is alert and oriented to person, place, and time.  Psychiatric:        Mood and Affect: Mood normal.        Behavior: Behavior normal.           Assessment & Plan:  1.Complex regional pain syndrome right lower  extremity postoperative. She's s/p post plantar fascial release. She continue withsensitivity totouch. She has diffuse numbness and tingling. Continuecurrent medication regime withGabapentin.12/14/2018. Began Slow weaning:Refilled  Ms Contin 15 mg one tablet daily #30 andOxycodone 10 mg one tablet4times daily as needed for pain, may take an extra tablet when pain is sever  #135.We will continue the opioid monitoring program, this consists of regular clinic visits, examinations, urine drug screen, pill counts as well as use of [place] Controlled Substance reporting System. 2.Myofascial pain syndrome:Continuecurrent medication regime withTizanidine and ice, heat and exercise regime. 12/14/2018 3. Depression: Continue: Zoloft.PCP Following.12/14/2018 4. Cervical Post Laminectomy: Continue to Monitor.12/14/2018 5.Cervicalgia/Cervical Radiculopathy/ Bilateral Hand Pain, Left hand with tingling and burning. Continuecurrent medication regime withGabapentin ( increased . Call or send My Chart message in two weeks for medication evaluation, she verbalizes understanding. 12/14/2018 6. Peripheral Neuropathy: Continuecurrent medication regime withGabapentin:12/14/2018 7.Chronic LeftShoulder Pain:OrthoFollowing.Continue current medication regime and Continue to Monitor. 12/14/2018 8. Right hand pain/ Post Surgical Procedure: .Trigger Finger Release: Dr. Jerilee Hoh Following.12/14/2018.  20 minutes of face to face patient care time was spent during this visit. All questions were encouraged and answered.  F/U in 1 month

## 2018-12-15 ENCOUNTER — Encounter: Payer: Self-pay | Admitting: Registered Nurse

## 2019-01-18 ENCOUNTER — Encounter: Payer: Self-pay | Admitting: Registered Nurse

## 2019-01-18 ENCOUNTER — Encounter: Payer: 59 | Attending: Physical Medicine & Rehabilitation | Admitting: Registered Nurse

## 2019-01-18 ENCOUNTER — Other Ambulatory Visit: Payer: Self-pay

## 2019-01-18 VITALS — BP 107/74 | HR 108 | Temp 98.5°F | Ht 67.0 in | Wt 270.0 lb

## 2019-01-18 DIAGNOSIS — Z79891 Long term (current) use of opiate analgesic: Secondary | ICD-10-CM

## 2019-01-18 DIAGNOSIS — M797 Fibromyalgia: Secondary | ICD-10-CM | POA: Insufficient documentation

## 2019-01-18 DIAGNOSIS — M542 Cervicalgia: Secondary | ICD-10-CM | POA: Diagnosis not present

## 2019-01-18 DIAGNOSIS — G8929 Other chronic pain: Secondary | ICD-10-CM

## 2019-01-18 DIAGNOSIS — G894 Chronic pain syndrome: Secondary | ICD-10-CM

## 2019-01-18 DIAGNOSIS — M961 Postlaminectomy syndrome, not elsewhere classified: Secondary | ICD-10-CM | POA: Diagnosis not present

## 2019-01-18 DIAGNOSIS — M5412 Radiculopathy, cervical region: Secondary | ICD-10-CM | POA: Diagnosis not present

## 2019-01-18 DIAGNOSIS — Z5181 Encounter for therapeutic drug level monitoring: Secondary | ICD-10-CM | POA: Insufficient documentation

## 2019-01-18 DIAGNOSIS — G90521 Complex regional pain syndrome I of right lower limb: Secondary | ICD-10-CM

## 2019-01-18 DIAGNOSIS — M25512 Pain in left shoulder: Secondary | ICD-10-CM

## 2019-01-18 DIAGNOSIS — M7918 Myalgia, other site: Secondary | ICD-10-CM

## 2019-01-18 MED ORDER — OXYCODONE HCL 10 MG PO TABS: 10.0000 mg | ORAL_TABLET | Freq: Four times a day (QID) | ORAL | 0 refills | Status: DC | PRN

## 2019-01-18 MED ORDER — GABAPENTIN 800 MG PO TABS: 800.0000 mg | ORAL_TABLET | Freq: Four times a day (QID) | ORAL | 3 refills | Status: DC

## 2019-01-18 NOTE — Progress Notes (Signed)
Subjective:    Patient ID: Sabrina Williams, female    DOB: December 19, 1970, 48 y.o.   MRN: [id number]  HPI: Sabrina Williams is a 48 y.o. female who returns for follow up appointment for chronic pain and medication refill. She states her pain is located in her neck, left shoulder and lower back. She rates her pain 3. Her current exercise regime is walking and performing stretching exercises.  Ms. Wayment Morphine equivalent is 67.50  MME.  UDS ordered today.  Pain Inventory Average Pain 4 Pain Right Now 3 My pain is constant, sharp, burning, dull, stabbing, tingling and aching  In the last 24 hours, has pain interfered with the following? General activity 7 Relation with others 4 Enjoyment of life 6 What TIME of day is your pain at its worst? evening Sleep (in general) Fair  Pain is worse with: bending, standing and some activites Pain improves with: rest, heat/ice, therapy/exercise and medication Relief from Meds: 6  Mobility how many minutes can you walk? 45-60 ability to climb steps?  yes do you drive?  yes Do you have any goals in this area?  yes  Function not employed: date last employed . I need assistance with the following:  meal prep, household duties and shopping Do you have any goals in this area?  no  Neuro/Psych No problems in this area  Prior Studies Any changes since last visit?  no  Physicians involved in your care Any changes since last visit?  no   Family History  Problem Relation Age of Onset  . Cancer Mother        multiple myeloma  . Diabetes Father   . Kidney disease Father   . Heart disease Father    Social History   Socioeconomic History  . Marital status: Married    Spouse name: Not on file  . Number of children: Not on file  . Years of education: Not on file  . Highest education level: Not on file  Occupational History  . Not on file  Social Needs  . Financial resource strain: Not on file  . Food insecurity    Worry: Not on  file    Inability: Not on file  . Transportation needs    Medical: Not on file    Non-medical: Not on file  Tobacco Use  . Smoking status: Never Smoker  . Smokeless tobacco: Never Used  Substance and Sexual Activity  . Alcohol use: No    Alcohol/week: 0.0 standard drinks  . Drug use: No  . Sexual activity: Not on file  Lifestyle  . Physical activity    Days per week: Not on file    Minutes per session: Not on file  . Stress: Not on file  Relationships  . Social Herbalist on phone: Not on file    Gets together: Not on file    Attends religious service: Not on file    Active member of club or organization: Not on file    Attends meetings of clubs or organizations: Not on file    Relationship status: Not on file  Other Topics Concern  . Not on file  Social History Narrative  . Not on file   Past Surgical History:  Procedure Laterality Date  . CESAREAN SECTION     x2  . dental implant  July 2015  . ENDOSCOPIC PLANTAR FASCIOTOMY Right August 2015  . LITHOTRIPSY     multiple  . SHOULDER SURGERY  6/06  . SMALL INTESTINE SURGERY     cervical laminectomy  . utereroscopy  1995   Past Medical History:  Diagnosis Date  . Chronic kidney disease    stones  . Depression   . Diabetes mellitus   . Hypertension   . Neuromuscular disorder (HCC)    BP 107/74 (BP Location: Right Arm, Patient Position: Sitting)   Pulse (!) 108   Temp 98.5 F (36.9 C) (Oral)   Ht '5\' 7"'$  (1.702 m)   Wt 270 lb (122.5 kg)   SpO2 95%   BMI 42.29 kg/m   Opioid Risk Score:   Fall Risk Score:  `1  Depression screen PHQ 2/9  Depression screen [facility] 2/9 07/10/2018 06/12/2018 02/17/2018 07/01/2017 03/11/2017 02/14/2017 12/03/2016  Decreased Interest 0 0 0 0 0 0 0  Down, Depressed, Hopeless 0 0 0 0 0 0 0  PHQ - 2 Score 0 0 0 0 0 0 0  Altered sleeping - - - - - - -  Tired, decreased energy - - - - - - -  Change in appetite - - - - - - -  Feeling bad or failure about yourself  - - - - - - -   Trouble concentrating - - - - - - -  Moving slowly or fidgety/restless - - - - - - -  Suicidal thoughts - - - - - - -  PHQ-9 Score - - - - - - -    Review of Systems  Constitutional: Negative.   HENT: Negative.   Eyes: Negative.   Respiratory: Negative.   Cardiovascular: Negative.   Gastrointestinal: Negative.   Endocrine: Negative.   Genitourinary: Negative.   Musculoskeletal: Negative.   Skin: Negative.   Allergic/Immunologic: Negative.   Neurological: Negative.   Hematological: Negative.   Psychiatric/Behavioral: Negative.   All other systems reviewed and are negative.      Objective:   Physical Exam Vitals signs and nursing note reviewed.  Constitutional:      Appearance: Normal appearance.  Neck:     Musculoskeletal: Normal range of motion and neck supple.  Cardiovascular:     Rate and Rhythm: Normal rate and regular rhythm.     Pulses: Normal pulses.     Heart sounds: Normal heart sounds.  Pulmonary:     Effort: Pulmonary effort is normal.     Breath sounds: Normal breath sounds.  Musculoskeletal:     Comments: Normal Muscle Bulk and Muscle Testing Reveals:  Upper Extremities: Full ROM and Muscle Strength 5/5 Left AC Joint Tenderness Lumbar Hypersensitivity Lower Extremities: Full ROM and Muscle Strength 5/5 Arises from Table with ease Narrow Based Gait   Skin:    General: Skin is warm and dry.  Neurological:     Mental Status: She is alert and oriented to person, place, and time.  Psychiatric:        Mood and Affect: Mood normal.        Behavior: Behavior normal.           Assessment & Plan:  1.Complex regional pain syndrome right lower extremity postoperative. She's s/p post plantar fascial release. She continue withsensitivity totouch. She has diffuse numbness and tingling. Increased Gabapentin.01/18/2019. Began Slow weaning:RefilledMs Contin discontinued. andOxycodone 10 mg one tablet4times daily as needed for pain, may take an extra  tablet when pain is sever #135.We will continue the opioid monitoring program, this consists of regular clinic visits, examinations, urine drug screen, pill counts as well as use of  [place] Controlled Substance reporting System. 2.Myofascial pain syndrome:Continuecurrent medication regime withTizanidine and ice, heat and exercise regime. 01/18/2019 3. Depression: Continue: Zoloft.PCP Following.01/18/2019 4. Cervical Post Laminectomy: Continue to Monitor.01/18/2019 5.Cervicalgia/Cervical Radiculopathy/ Bilateral Hand Pain, Left hand with tingling and burning. Continuecurrent medication regime withGabapentin 01/18/2019 6. Peripheral Neuropathy: Increased: Gabapentin:01/18/2019 7.Chronic LeftShoulder Pain:OrthoFollowing.Continue current medication regime and Continue to Monitor. 01/18/2019 8. Right hand pain/ Post Surgical Procedure: No complaints today.Trigger Finger Release: Dr. Jerilee Hoh Following.01/18/2019.  69mnutes of face to face patient care time was spent during this visit. All questions were encouraged and answered.  F/U in 1 month

## 2019-01-24 LAB — TOXASSURE SELECT,+ANTIDEPR,UR

## 2019-01-26 ENCOUNTER — Telehealth: Payer: Self-pay | Admitting: *Deleted

## 2019-01-26 NOTE — Telephone Encounter (Signed)
Urine drug screen for this encounter is consistent for prescribed medication 

## 2019-02-15 ENCOUNTER — Encounter: Payer: 59 | Attending: Physical Medicine & Rehabilitation | Admitting: Registered Nurse

## 2019-02-15 ENCOUNTER — Other Ambulatory Visit: Payer: Self-pay

## 2019-02-15 ENCOUNTER — Encounter: Payer: Self-pay | Admitting: Registered Nurse

## 2019-02-15 VITALS — BP 139/92 | HR 92 | Temp 97.7°F | Ht 67.0 in | Wt 274.0 lb

## 2019-02-15 DIAGNOSIS — Z5181 Encounter for therapeutic drug level monitoring: Secondary | ICD-10-CM | POA: Insufficient documentation

## 2019-02-15 DIAGNOSIS — M797 Fibromyalgia: Secondary | ICD-10-CM | POA: Diagnosis not present

## 2019-02-15 DIAGNOSIS — M961 Postlaminectomy syndrome, not elsewhere classified: Secondary | ICD-10-CM | POA: Diagnosis present

## 2019-02-15 DIAGNOSIS — G8929 Other chronic pain: Secondary | ICD-10-CM

## 2019-02-15 DIAGNOSIS — M5412 Radiculopathy, cervical region: Secondary | ICD-10-CM | POA: Insufficient documentation

## 2019-02-15 DIAGNOSIS — M542 Cervicalgia: Secondary | ICD-10-CM

## 2019-02-15 DIAGNOSIS — M545 Low back pain: Secondary | ICD-10-CM

## 2019-02-15 DIAGNOSIS — M7918 Myalgia, other site: Secondary | ICD-10-CM

## 2019-02-15 DIAGNOSIS — G90521 Complex regional pain syndrome I of right lower limb: Secondary | ICD-10-CM | POA: Insufficient documentation

## 2019-02-15 DIAGNOSIS — R251 Tremor, unspecified: Secondary | ICD-10-CM

## 2019-02-15 MED ORDER — OXYCODONE HCL 10 MG PO TABS: 10.0000 mg | ORAL_TABLET | Freq: Four times a day (QID) | ORAL | 0 refills | Status: DC | PRN

## 2019-02-15 NOTE — Progress Notes (Signed)
Subjective:    Patient ID: Sabrina Williams, female    DOB: 01/05/1971, 48 y.o.   MRN: [id number]  HPI: Sabrina Williams is a 48 y.o. female who returns for follow up appointment for chronic pain and medication refill. She states her pain is located in her neck, lower back mainly right side, bilateral knees and right foot with tingling and burning. Ms.Vanzile repots she has noticed an increase in intensity and frequency with tremors in upper and lower extremities. We will place a referral to neurology, she verbalizes understanding. She rates her pain 4. Her current exercise regime is walking.  Ms. Hart Morphine equivalent is 67.50 MME.  Last UDS was Performed on 01/18/2019, it was consistent.   Pain Inventory Average Pain 4 Pain Right Now 4 My pain is constant, sharp, burning, dull, stabbing, tingling and aching  In the last 24 hours, has pain interfered with the following? General activity 6 Relation with others 3 Enjoyment of life 5 What TIME of day is your pain at its worst? evening Sleep (in general) Fair  Pain is worse with: standing and some activites Pain improves with: rest, heat/ice, therapy/exercise and medication Relief from Meds: 6  Mobility walk with assistance how many minutes can you walk? 30-60 ability to climb steps?  yes do you drive?  yes Do you have any goals in this area?  yes  Function not employed: date last employed . I need assistance with the following:  meal prep, household duties and shopping Do you have any goals in this area?  yes  Neuro/Psych tremor spasms  Prior Studies Any changes since last visit?  no  Physicians involved in your care Any changes since last visit?  no   Family History  Problem Relation Age of Onset  . Cancer Mother        multiple myeloma  . Diabetes Father   . Kidney disease Father   . Heart disease Father    Social History   Socioeconomic History  . Marital status: Married    Spouse name: Not on file   . Number of children: Not on file  . Years of education: Not on file  . Highest education level: Not on file  Occupational History  . Not on file  Social Needs  . Financial resource strain: Not on file  . Food insecurity    Worry: Not on file    Inability: Not on file  . Transportation needs    Medical: Not on file    Non-medical: Not on file  Tobacco Use  . Smoking status: Never Smoker  . Smokeless tobacco: Never Used  Substance and Sexual Activity  . Alcohol use: No    Alcohol/week: 0.0 standard drinks  . Drug use: No  . Sexual activity: Not on file  Lifestyle  . Physical activity    Days per week: Not on file    Minutes per session: Not on file  . Stress: Not on file  Relationships  . Social Herbalist on phone: Not on file    Gets together: Not on file    Attends religious service: Not on file    Active member of club or organization: Not on file    Attends meetings of clubs or organizations: Not on file    Relationship status: Not on file  Other Topics Concern  . Not on file  Social History Narrative  . Not on file   Past Surgical History:  Procedure  Laterality Date  . CESAREAN SECTION     x2  . dental implant  July 2015  . ENDOSCOPIC PLANTAR FASCIOTOMY Right August 2015  . LITHOTRIPSY     multiple  . SHOULDER SURGERY  6/06  . SMALL INTESTINE SURGERY     cervical laminectomy  . utereroscopy  1995   Past Medical History:  Diagnosis Date  . Chronic kidney disease    stones  . Depression   . Diabetes mellitus   . Hypertension   . Neuromuscular disorder (HCC)    BP (!) 151/96   Pulse (!) 104   Temp 97.7 F (36.5 C)   Ht '5\' 7"'$  (1.702 m)   Wt 274 lb (124.3 kg)   SpO2 98%   BMI 42.91 kg/m   Opioid Risk Score:   Fall Risk Score:  `1  Depression screen PHQ 2/9  Depression screen [facility] 2/9 07/10/2018 06/12/2018 02/17/2018 07/01/2017 03/11/2017 02/14/2017 12/03/2016  Decreased Interest 0 0 0 0 0 0 0  Down, Depressed, Hopeless 0 0 0 0 0 0 0   PHQ - 2 Score 0 0 0 0 0 0 0  Altered sleeping - - - - - - -  Tired, decreased energy - - - - - - -  Change in appetite - - - - - - -  Feeling bad or failure about yourself  - - - - - - -  Trouble concentrating - - - - - - -  Moving slowly or fidgety/restless - - - - - - -  Suicidal thoughts - - - - - - -  PHQ-9 Score - - - - - - -    Review of Systems  Constitutional: Negative.   HENT: Negative.   Eyes: Negative.   Respiratory: Negative.   Gastrointestinal: Negative.   Genitourinary: Negative.   Musculoskeletal: Positive for arthralgias, back pain, neck pain and neck stiffness.       Spasms   Skin: Negative.   Allergic/Immunologic: Negative.   Neurological: Positive for tremors.  Psychiatric/Behavioral: Negative.   All other systems reviewed and are negative.      Objective:   Physical Exam Vitals signs and nursing note reviewed.  Constitutional:      Appearance: Normal appearance.  Neck:     Musculoskeletal: Normal range of motion and neck supple.  Cardiovascular:     Rate and Rhythm: Normal rate and regular rhythm.     Pulses: Normal pulses.     Heart sounds: Normal heart sounds.  Pulmonary:     Effort: Pulmonary effort is normal.     Breath sounds: Normal breath sounds.  Musculoskeletal:     Comments: Normal Muscle Bulk and Muscle Testing Reveals:  Upper Extremities: Full ROM and Muscle Strength 5/5 Lumbar Paraspinal Tenderness: L-4-L-5 Lower Extremities: Full ROM and Muscle Strength 5/5 Arises from Table with ease Narrow Based Gait   Skin:    General: Skin is warm and dry.  Neurological:     Mental Status: She is alert and oriented to person, place, and time.  Psychiatric:        Mood and Affect: Mood normal.        Behavior: Behavior normal.           Assessment & Plan:  1.Complex regional pain syndrome right lower extremity postoperative. She's s/p post plantar fascial release. She continue withsensitivity totouch. She has diffuse numbness and  tingling. Continue Gabapentin.02/15/2019. Refilled: Oxycodone 10 mg one tablet4times daily as needed for pain, may take  an extra tablet when pain is sever#135.We will continue the opioid monitoring program, this consists of regular clinic visits, examinations, urine drug screen, pill counts as well as use of [place] Controlled Substance reporting System. 2.Myofascial pain syndrome:Continuecurrent medication regime withTizanidine and ice, heat and exercise regime. 02/15/2019 3. Depression: Continue: Zoloft.PCP Following.02/15/2019 4. Cervical Post Laminectomy: Continue to Monitor.02/15/2019 5.Cervicalgia/Cervical Radiculopathy/ Bilateral Hand Pain, Left hand with tingling and burning. No complaints today. Continuecurrent medication regime withGabapentin 02/15/2019 6. Peripheral Neuropathy: Continue Gabapentin:Continue to Monitor.02/15/2019 7.Chronic LeftShoulder Pain:OrthoFollowing.No complaints today. Continue current medication regime and Continue to Monitor. 02/15/2019 8. Right hand pain/ Post Surgical Procedure: No complaints today.Trigger Finger Release: Dr. Jerilee Hoh Following.02/15/2019. 9. Tremor: RX: Referral Neurology.    76mnutes of face to face patient care time was spent during this visit. All questions were encouraged and answered.  F/U in 1 month

## 2019-02-16 ENCOUNTER — Other Ambulatory Visit: Payer: Self-pay | Admitting: Registered Nurse

## 2019-03-14 ENCOUNTER — Encounter: Payer: Self-pay | Admitting: Registered Nurse

## 2019-03-14 ENCOUNTER — Encounter: Payer: 59 | Attending: Physical Medicine & Rehabilitation | Admitting: Registered Nurse

## 2019-03-14 ENCOUNTER — Other Ambulatory Visit: Payer: Self-pay

## 2019-03-14 VITALS — BP 145/93 | HR 96 | Temp 97.7°F | Ht 67.0 in | Wt 277.0 lb

## 2019-03-14 DIAGNOSIS — M5412 Radiculopathy, cervical region: Secondary | ICD-10-CM | POA: Insufficient documentation

## 2019-03-14 DIAGNOSIS — G894 Chronic pain syndrome: Secondary | ICD-10-CM | POA: Diagnosis not present

## 2019-03-14 DIAGNOSIS — M7918 Myalgia, other site: Secondary | ICD-10-CM

## 2019-03-14 DIAGNOSIS — M961 Postlaminectomy syndrome, not elsewhere classified: Secondary | ICD-10-CM | POA: Diagnosis not present

## 2019-03-14 DIAGNOSIS — G90521 Complex regional pain syndrome I of right lower limb: Secondary | ICD-10-CM | POA: Diagnosis not present

## 2019-03-14 DIAGNOSIS — Z5181 Encounter for therapeutic drug level monitoring: Secondary | ICD-10-CM

## 2019-03-14 DIAGNOSIS — Z79891 Long term (current) use of opiate analgesic: Secondary | ICD-10-CM

## 2019-03-14 DIAGNOSIS — M797 Fibromyalgia: Secondary | ICD-10-CM | POA: Diagnosis not present

## 2019-03-14 DIAGNOSIS — R202 Paresthesia of skin: Secondary | ICD-10-CM

## 2019-03-14 DIAGNOSIS — G629 Polyneuropathy, unspecified: Secondary | ICD-10-CM

## 2019-03-14 DIAGNOSIS — R200 Anesthesia of skin: Secondary | ICD-10-CM

## 2019-03-14 MED ORDER — OXYCODONE HCL 10 MG PO TABS: 10.0000 mg | ORAL_TABLET | Freq: Four times a day (QID) | ORAL | 0 refills | Status: DC | PRN

## 2019-03-14 MED ORDER — GABAPENTIN 600 MG PO TABS: 600.0000 mg | ORAL_TABLET | Freq: Every day | ORAL | 3 refills | Status: DC

## 2019-03-14 NOTE — Progress Notes (Signed)
Subjective:    Patient ID: Sabrina Williams, female    DOB: Aug 29, 1970, 48 y.o.   MRN: [id number]  HPI: Sabrina Williams is a 48 y.o. female who returns for follow up appointment for chronic pain and medication refill. She states her pain pain is located in her left shoulder, left  arm and left hand with tingling and burning and right foot pain. She rates her pain 3. Her current exercise regime is walking and performing stretching exercises.  Sabrina Williams brought in her note book when her gabapentin dosage was increased this is when she noticed she was having tremors, she has decreased her gabapentin dosage back to 3000 mg/ 24 hr. She has noticed a decrease  frequency of tremors, we will continue to observe. She has a scheduled appointment with neurology in November.   Sabrina Williams is 75.00  MME.  Last UDS was Performed on 01/18/2019, it was consistent.   Pain Inventory Average Pain 5 Pain Right Now 3 My pain is constant, sharp, burning, dull, stabbing, tingling and aching  In the last 24 hours, has pain interfered with the following? General activity 7 Relation with others 3 Enjoyment of life 5 What TIME of day is your pain at its worst? evening Sleep (in general) Fair  Pain is worse with: standing and some activites Pain improves with: rest, heat/ice, therapy/exercise and medication Relief from Meds: 6  Mobility walk without assistance how many minutes can you walk? 60 ability to climb steps?  yes do you drive?  yes Do you have any goals in this area?  yes  Function not employed: date last employed . I need assistance with the following:  meal prep, household duties and shopping  Neuro/Psych weakness tremor spasms  Prior Studies Any changes since last visit?  no  Physicians involved in your care Any changes since last visit?  no   Family History  Problem Relation Age of Onset  . Cancer Mother        multiple myeloma  . Diabetes Father   .  Kidney disease Father   . Heart disease Father    Social History   Socioeconomic History  . Marital status: Married    Spouse name: Not on file  . Number of children: Not on file  . Years of education: Not on file  . Highest education level: Not on file  Occupational History  . Not on file  Social Needs  . Financial resource strain: Not on file  . Food insecurity    Worry: Not on file    Inability: Not on file  . Transportation needs    Medical: Not on file    Non-medical: Not on file  Tobacco Use  . Smoking status: Never Smoker  . Smokeless tobacco: Never Used  Substance and Sexual Activity  . Alcohol use: No    Alcohol/week: 0.0 standard drinks  . Drug use: No  . Sexual activity: Not on file  Lifestyle  . Physical activity    Days per week: Not on file    Minutes per session: Not on file  . Stress: Not on file  Relationships  . Social Herbalist on phone: Not on file    Gets together: Not on file    Attends religious service: Not on file    Active member of club or organization: Not on file    Attends meetings of clubs or organizations: Not on file    Relationship  status: Not on file  Other Topics Concern  . Not on file  Social History Narrative  . Not on file   Past Surgical History:  Procedure Laterality Date  . CESAREAN SECTION     x2  . dental implant  July 2015  . ENDOSCOPIC PLANTAR FASCIOTOMY Right August 2015  . LITHOTRIPSY     multiple  . SHOULDER SURGERY  6/06  . SMALL INTESTINE SURGERY     cervical laminectomy  . utereroscopy  1995   Past Medical History:  Diagnosis Date  . Chronic kidney disease    stones  . Depression   . Diabetes mellitus   . Hypertension   . Neuromuscular disorder (HCC)    BP (!) 145/93   Pulse 96   Temp 97.7 F (36.5 C)   Ht '5\' 7"'$  (1.702 m)   Wt 277 lb (125.6 kg)   SpO2 96%   BMI 43.38 kg/m   Opioid Risk Score:   Fall Risk Score:  `1  Depression screen PHQ 2/9  Depression screen [facility] 2/9  07/10/2018 06/12/2018 02/17/2018 07/01/2017 03/11/2017 02/14/2017 12/03/2016  Decreased Interest 0 0 0 0 0 0 0  Down, Depressed, Hopeless 0 0 0 0 0 0 0  PHQ - 2 Score 0 0 0 0 0 0 0  Altered sleeping - - - - - - -  Tired, decreased energy - - - - - - -  Change in appetite - - - - - - -  Feeling bad or failure about yourself  - - - - - - -  Trouble concentrating - - - - - - -  Moving slowly or fidgety/restless - - - - - - -  Suicidal thoughts - - - - - - -  PHQ-9 Score - - - - - - -    Review of Systems  Constitutional: Negative.   HENT: Negative.   Eyes: Negative.   Respiratory: Negative.   Cardiovascular: Negative.   Gastrointestinal: Negative.   Endocrine: Negative.   Genitourinary: Negative.   Musculoskeletal: Positive for arthralgias, back pain and neck pain.       Spasms   Neurological: Positive for tremors and weakness.  Hematological: Negative.   Psychiatric/Behavioral: Negative.   All other systems reviewed and are negative.      Objective:   Physical Exam Vitals signs and nursing note reviewed.  Constitutional:      Appearance: Normal appearance.  Neck:     Musculoskeletal: Normal range of motion and neck supple.  Cardiovascular:     Rate and Rhythm: Normal rate and regular rhythm.     Pulses: Normal pulses.     Heart sounds: Normal heart sounds.  Pulmonary:     Effort: Pulmonary effort is normal.     Breath sounds: Normal breath sounds.  Musculoskeletal:     Comments: Normal Muscle Bulk and Muscle Testing Reveals:  Upper Extremities: Full ROM and Muscle Strength 5/5  Lower Extremities: Full ROM and Muscle Strength 5/5 Arises from Table with ease Narrow Based Gait   Skin:    General: Skin is warm and dry.  Neurological:     Mental Status: She is alert and oriented to person, place, and time.  Psychiatric:        Mood and Affect: Mood normal.        Behavior: Behavior normal.           Assessment & Plan:  1.Complex regional pain syndrome right lower  extremity postoperative. She's s/p  post plantar fascial release. She continue withsensitivity totouch. She has diffuse numbness and tingling.ContinueGabapentin.03/14/2019. Refilled: Oxycodone 10 mg one tablet4times daily as needed for pain, may take an extra tablet when pain is sever#135.We will continue the opioid monitoring program, this consists of regular clinic visits, examinations, urine drug screen, pill counts as well as use of [place] Controlled Substance reporting System. 2.Myofascial pain syndrome:Continuecurrent medication regime withTizanidine and ice, heat and exercise regime. 03/14/2019 3. Depression: Continue: Zoloft.PCP Following.03/14/2019 4. Cervical Post Laminectomy: Continue to Monitor.03/14/2019 5.Cervicalgia/Cervical Radiculopathy/ Bilateral Hand Pain, Left arm and left hand with tingling and burning. Continuecurrent medication regime withGabapentin 03/14/2019 6. Peripheral Neuropathy:Continue Gabapentin:Continue to Monitor.03/14/2019 7.Chronic LeftShoulder Pain:OrthoFollowing.Continue current medication regime and Continue to Monitor. 03/14/2019 8. Right hand pain/ Post Surgical Procedure:No complaints today.Trigger Finger Release: Dr. Jerilee Hoh Following.03/14/2019. 9. Tremor: Gabapentin decreased to 600 mg five times a day. She has a : Referral with Neurology.

## 2019-03-16 ENCOUNTER — Telehealth: Payer: Self-pay | Admitting: *Deleted

## 2019-03-16 NOTE — Telephone Encounter (Signed)
Sabrina Williams called and she only received #90 gabapentin 600 mg and it is to be taken 5x day.  She is asking if this is a mistake or did Eunice mean to prescribe it that way.

## 2019-03-19 MED ORDER — GABAPENTIN 600 MG PO TABS: 600.0000 mg | ORAL_TABLET | Freq: Every day | ORAL | 3 refills | Status: DC

## 2019-03-19 NOTE — Telephone Encounter (Signed)
Placed a call to Ms. Quashie, no answer. Gabapentin prescription sent to pharmacy with correct quantity. Left message for Ms. Floren.

## 2019-03-21 DIAGNOSIS — N1832 Chronic kidney disease, stage 3b: Secondary | ICD-10-CM | POA: Insufficient documentation

## 2019-04-02 ENCOUNTER — Ambulatory Visit (INDEPENDENT_AMBULATORY_CARE_PROVIDER_SITE_OTHER): Payer: 59 | Admitting: Neurology

## 2019-04-02 ENCOUNTER — Encounter: Payer: Self-pay | Admitting: Neurology

## 2019-04-02 ENCOUNTER — Other Ambulatory Visit: Payer: Self-pay

## 2019-04-02 VITALS — BP 96/65 | HR 102 | Temp 97.1°F | Ht 67.0 in | Wt 272.5 lb

## 2019-04-02 DIAGNOSIS — G253 Myoclonus: Secondary | ICD-10-CM | POA: Diagnosis not present
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
## 2019-04-03 ENCOUNTER — Telehealth: Payer: Self-pay | Admitting: Neurology

## 2019-04-03 LAB — COMPREHENSIVE METABOLIC PANEL
ALT: 22 IU/L (ref 0–32)
AST: 23 IU/L (ref 0–40)
Albumin/Globulin Ratio: 1.3 (ref 1.2–2.2)
Albumin: 4 g/dL (ref 3.8–4.8)
Alkaline Phosphatase: 126 IU/L — ABNORMAL HIGH (ref 39–117)
BUN/Creatinine Ratio: 13 (ref 9–23)
BUN: 30 mg/dL — ABNORMAL HIGH (ref 6–24)
Bilirubin Total: <0.2 mg/dL (ref 0.0–1.2)
CO2: 22 mmol/L (ref 20–29)
Calcium: 9.3 mg/dL (ref 8.7–10.2)
Chloride: 105 mmol/L (ref 96–106)
Creatinine, Ser: 2.24 mg/dL — ABNORMAL HIGH (ref 0.57–1.00)
GFR calc Af Amer: 29 mL/min/{1.73_m2} — ABNORMAL LOW (ref >59)
GFR calc non Af Amer: 25 mL/min/{1.73_m2} — ABNORMAL LOW (ref >59)
Globulin, Total: 3.2 g/dL (ref 1.5–4.5)
Glucose: 121 mg/dL — ABNORMAL HIGH (ref 65–99)
Potassium: 5.6 mmol/L — ABNORMAL HIGH (ref 3.5–5.2)
Sodium: 143 mmol/L (ref 134–144)
Total Protein: 7.2 g/dL (ref 6.0–8.5)

## 2019-04-03 LAB — CBC WITH DIFFERENTIAL/PLATELET
Basophils Absolute: 0.1 10*3/uL (ref 0.0–0.2)
Basos: 1 %
EOS (ABSOLUTE): 0.5 10*3/uL — ABNORMAL HIGH (ref 0.0–0.4)
Eos: 5 %
Hematocrit: 38.1 % (ref 34.0–46.6)
Hemoglobin: 12.4 g/dL (ref 11.1–15.9)
Immature Grans (Abs): 0 10*3/uL (ref 0.0–0.1)
Immature Granulocytes: 0 %
Lymphocytes Absolute: 3.2 10*3/uL — ABNORMAL HIGH (ref 0.7–3.1)
Lymphs: 29 %
MCH: 30.6 pg (ref 26.6–33.0)
MCHC: 32.5 g/dL (ref 31.5–35.7)
MCV: 94 fL (ref 79–97)
Monocytes Absolute: 0.5 10*3/uL (ref 0.1–0.9)
Monocytes: 5 %
Neutrophils Absolute: 6.7 10*3/uL (ref 1.4–7.0)
Neutrophils: 60 %
Platelets: 258 10*3/uL (ref 150–450)
RBC: 4.05 x10E6/uL (ref 3.77–5.28)
RDW: 12.2 % (ref 11.7–15.4)
WBC: 11 10*3/uL — ABNORMAL HIGH (ref 3.4–10.8)

## 2019-04-03 LAB — TSH: TSH: 0.333 u[IU]/mL — ABNORMAL LOW (ref 0.450–4.500)

## 2019-04-03 LAB — VITAMIN D 25 HYDROXY (VIT D DEFICIENCY, FRACTURES): Vit D, 25-Hydroxy: 32.1 ng/mL (ref 30.0–100.0)

## 2019-04-03 LAB — HGB A1C W/O EAG: Hgb A1c MFr Bld: 5.6 % (ref 4.8–5.6)

## 2019-04-03 LAB — CK: Total CK: 58 U/L (ref 32–182)

## 2019-04-03 LAB — RPR: RPR Ser Ql: NONREACTIVE

## 2019-04-03 LAB — VITAMIN B12: Vitamin B-12: >2000 pg/mL — ABNORMAL HIGH (ref 232–1245)

## 2019-04-03 NOTE — Telephone Encounter (Signed)
Please call patient, laboratory evaluation showed decreased TSH 0.33, which can indicate hyperfunctioning of the thyroid, I have forwarded lab results to her primary care physician  There is mild elevated WBC, ask her if she has any signs of infection,  Evidence of abnormal kidney function, elevated creatinine 2.24, with as needed GFR of 25, mild abnormal alkaline phosphate,  She should continue follow-up with her primary care physician for above abnormal laboratory evaluations

## 2019-04-03 NOTE — Telephone Encounter (Signed)
Left message requesting a return call.

## 2019-04-04 NOTE — Telephone Encounter (Signed)
Left second message requesting a return call. 

## 2019-04-04 NOTE — Telephone Encounter (Signed)
Open in error

## 2019-04-04 NOTE — Telephone Encounter (Signed)
Left third message requesting a call back.

## 2019-04-04 NOTE — Telephone Encounter (Signed)
Fourth attempt to reach patient.  Left detailed message (ok per DPR).  Provided lab results with the instructions to follow up with her PCP.  Dr. Krista Blue forwarded her labs to PCP.  Provided our number and office hours to call back with any questions.

## 2019-04-12 ENCOUNTER — Encounter: Payer: 59 | Attending: Physical Medicine & Rehabilitation | Admitting: Registered Nurse

## 2019-04-12 ENCOUNTER — Other Ambulatory Visit: Payer: Self-pay

## 2019-04-12 ENCOUNTER — Encounter: Payer: 59 | Admitting: Registered Nurse

## 2019-04-12 VITALS — BP 101/70 | HR 74 | Temp 97.7°F | Ht 68.0 in | Wt 273.2 lb

## 2019-04-12 DIAGNOSIS — M797 Fibromyalgia: Secondary | ICD-10-CM | POA: Diagnosis not present

## 2019-04-12 DIAGNOSIS — M25512 Pain in left shoulder: Secondary | ICD-10-CM

## 2019-04-12 DIAGNOSIS — G90521 Complex regional pain syndrome I of right lower limb: Secondary | ICD-10-CM | POA: Diagnosis not present

## 2019-04-12 DIAGNOSIS — M542 Cervicalgia: Secondary | ICD-10-CM

## 2019-04-12 DIAGNOSIS — M961 Postlaminectomy syndrome, not elsewhere classified: Secondary | ICD-10-CM | POA: Insufficient documentation

## 2019-04-12 DIAGNOSIS — M7918 Myalgia, other site: Secondary | ICD-10-CM

## 2019-04-12 DIAGNOSIS — M5412 Radiculopathy, cervical region: Secondary | ICD-10-CM | POA: Insufficient documentation

## 2019-04-12 DIAGNOSIS — G8929 Other chronic pain: Secondary | ICD-10-CM

## 2019-04-12 DIAGNOSIS — Z5181 Encounter for therapeutic drug level monitoring: Secondary | ICD-10-CM

## 2019-04-12 DIAGNOSIS — G629 Polyneuropathy, unspecified: Secondary | ICD-10-CM

## 2019-04-12 DIAGNOSIS — Z79891 Long term (current) use of opiate analgesic: Secondary | ICD-10-CM

## 2019-04-12 DIAGNOSIS — G894 Chronic pain syndrome: Secondary | ICD-10-CM

## 2019-04-12 MED ORDER — NUCYNTA 100 MG PO TABS: 1.0000 | ORAL_TABLET | Freq: Four times a day (QID) | ORAL | 0 refills | Status: DC | PRN

## 2019-04-12 NOTE — Progress Notes (Signed)
Subjective:    Patient ID: Sabrina Williams, female    DOB: 13-Jun-1970, 48 y.o.   MRN: [id number]  Sabrina Williams is a 47 y.o. female who returns for follow up appointment for chronic pain and medication refill. She state her  pain is located in her neck radiating into her left shoulder, left arm and left hand with tingling and burning. Also reports she would like to resume the Nucynta her pain was under control with Nucynta. We will resume her Nucynta and oxyucodone discontinued. She rates her pain 4. Her  current exercise regime is walking and performing stretching exercises.  Sabrina Williams Morphine equivalent is 72.32 MME.  Last UDS was Performed on 01/18/2019, it was consistent.    Pain Inventory Average Pain 4 Pain Right Now 4 My pain is constant, sharp, burning, dull, stabbing, tingling and aching  In the last 24 hours, has pain interfered with the following? General activity 0 Relation with others 4 Enjoyment of life 5 What TIME of day is your pain at its worst? evening Sleep (in general) Fair  Pain is worse with: walking, standing and some activites Pain improves with: rest, heat/ice, therapy/exercise and medication Relief from Meds: 7  Mobility walk without assistance how many minutes can you walk? 45-60 ability to climb steps?  yes do you drive?  yes  Function I need assistance with the following:  meal prep, household duties and shopping  Neuro/Psych tremor spasms dizziness depression anxiety  Prior Studies Any changes since last visit?  no  Physicians involved in your care Neurologist Dr. Krista Blue   Family History  Problem Relation Age of Onset  . Cancer Mother        multiple myeloma  . Diabetes Father   . Kidney disease Father   . Heart disease Father    Social History   Socioeconomic History  . Marital status: Married    Spouse name: Not on file  . Number of children: 2  . Years of education: some college  . Highest education level: Not  on file  Occupational History  . Occupation: homemaker  Social Needs  . Financial resource strain: Not on file  . Food insecurity    Worry: Not on file    Inability: Not on file  . Transportation needs    Medical: Not on file    Non-medical: Not on file  Tobacco Use  . Smoking status: Never Smoker  . Smokeless tobacco: Never Used  Substance and Sexual Activity  . Alcohol use: No    Alcohol/week: 0.0 standard drinks  . Drug use: No  . Sexual activity: Not on file  Lifestyle  . Physical activity    Days per week: Not on file    Minutes per session: Not on file  . Stress: Not on file  Relationships  . Social Herbalist on phone: Not on file    Gets together: Not on file    Attends religious service: Not on file    Active member of club or organization: Not on file    Attends meetings of clubs or organizations: Not on file    Relationship status: Not on file  Other Topics Concern  . Not on file  Social History Narrative   Lives at home with husband.   Right-handed.   No daily caffeine use.   Past Surgical History:  Procedure Laterality Date  . CERVICAL SPINE SURGERY     C6-7 fusion  . CESAREAN SECTION  x2  . dental implant  July 2015  . ENDOSCOPIC PLANTAR FASCIOTOMY Right August 2015  . GASTRIC BYPASS    . HAND SURGERY Right   . LITHOTRIPSY     multiple  . PLANTAR FASCIECTOMY Right   . SHOULDER SURGERY Left 10/2004  . SMALL INTESTINE SURGERY     cervical laminectomy  . utereroscopy  1995   Past Medical History:  Diagnosis Date  . Anxiety   . Chronic kidney disease    stones  . Chronic pain   . Complex regional pain syndrome I    right foot  . Depression   . Diabetes mellitus   . Hypertension   . Kidney stone   . Neuromuscular disorder ([place])   . Tremor    There were no vitals taken for this visit.  Opioid Risk Score:   Fall Risk Score:  `1  Depression screen PHQ 2/9  Depression screen [facility] 2/9 07/10/2018 06/12/2018 02/17/2018 07/01/2017  03/11/2017 02/14/2017 12/03/2016  Decreased Interest 0 0 0 0 0 0 0  Down, Depressed, Hopeless 0 0 0 0 0 0 0  PHQ - 2 Score 0 0 0 0 0 0 0  Altered sleeping - - - - - - -  Tired, decreased energy - - - - - - -  Change in appetite - - - - - - -  Feeling bad or failure about yourself  - - - - - - -  Trouble concentrating - - - - - - -  Moving slowly or fidgety/restless - - - - - - -  Suicidal thoughts - - - - - - -  PHQ-9 Score - - - - - - -   Pain Inventory Average Pain 4 Pain Right Now 4 My pain is constant, sharp, burning, dull, stabbing, tingling and aching  In the last 24 hours, has pain interfered with the following? General activity 4 Relation with others 4 Enjoyment of life 5 What TIME of day is your pain at its worst? evening Sleep (in general) Fair  Pain is worse with: walking, standing and some activites Pain improves with: rest, heat/ice, therapy/exercise, pacing activities and medication Relief from Meds: 6  Mobility walk without assistance how many minutes can you walk? 45-60 ability to climb steps?  yes do you drive?  yes  Function I need assistance with the following:  meal prep, household duties and shopping  Neuro/Psych tremor spasms dizziness depression anxiety  Prior Studies Any changes since last visit?  no  Physicians involved in your care Any changes since last visit?  yes Neurologist .   Family History  Problem Relation Age of Onset  . Cancer Mother        multiple myeloma  . Diabetes Father   . Kidney disease Father   . Heart disease Father    Social History   Socioeconomic History  . Marital status: Married    Spouse name: Not on file  . Number of children: 2  . Years of education: some college  . Highest education level: Not on file  Occupational History  . Occupation: homemaker  Social Needs  . Financial resource strain: Not on file  . Food insecurity    Worry: Not on file    Inability: Not on file  . Transportation  needs    Medical: Not on file    Non-medical: Not on file  Tobacco Use  . Smoking status: Never Smoker  . Smokeless tobacco: Never Used  Substance and  Sexual Activity  . Alcohol use: No    Alcohol/week: 0.0 standard drinks  . Drug use: No  . Sexual activity: Not on file  Lifestyle  . Physical activity    Days per week: Not on file    Minutes per session: Not on file  . Stress: Not on file  Relationships  . Social Herbalist on phone: Not on file    Gets together: Not on file    Attends religious service: Not on file    Active member of club or organization: Not on file    Attends meetings of clubs or organizations: Not on file    Relationship status: Not on file  Other Topics Concern  . Not on file  Social History Narrative   Lives at home with husband.   Right-handed.   No daily caffeine use.   Past Surgical History:  Procedure Laterality Date  . CERVICAL SPINE SURGERY     C6-7 fusion  . CESAREAN SECTION     x2  . dental implant  July 2015  . ENDOSCOPIC PLANTAR FASCIOTOMY Right August 2015  . GASTRIC BYPASS    . HAND SURGERY Right   . LITHOTRIPSY     multiple  . PLANTAR FASCIECTOMY Right   . SHOULDER SURGERY Left 10/2004  . SMALL INTESTINE SURGERY     cervical laminectomy  . utereroscopy  1995   Past Medical History:  Diagnosis Date  . Anxiety   . Chronic kidney disease    stones  . Chronic pain   . Complex regional pain syndrome I    right foot  . Depression   . Diabetes mellitus   . Hypertension   . Kidney stone   . Neuromuscular disorder ([place])   . Tremor    BP 101/70   Pulse 74   Temp 97.7 F (36.5 C)   Ht _0  (1.727 m)   Wt 273 lb 3.2 oz (123.9 kg)   SpO2 95%   BMI 41.54 kg/m   Opioid Risk Score:   Fall Risk Score:  `1  Depression screen PHQ 2/9  Depression screen [facility] 2/9 07/10/2018 06/12/2018 02/17/2018 07/01/2017 03/11/2017 02/14/2017 12/03/2016  Decreased Interest 0 0 0 0 0 0 0  Down, Depressed, Hopeless 0 0 0 0 0 0 0  PHQ  - 2 Score 0 0 0 0 0 0 0  Altered sleeping - - - - - - -  Tired, decreased energy - - - - - - -  Change in appetite - - - - - - -  Feeling bad or failure about yourself  - - - - - - -  Trouble concentrating - - - - - - -  Moving slowly or fidgety/restless - - - - - - -  Suicidal thoughts - - - - - - -  PHQ-9 Score - - - - - - -     Review of Systems  Neurological: Positive for dizziness and tremors.  Psychiatric/Behavioral: Positive for dysphoric mood. The patient is nervous/anxious.   All other systems reviewed and are negative.      Objective:   Physical Exam Vitals signs and nursing note reviewed.  Constitutional:      Appearance: Normal appearance.  Neck:     Musculoskeletal: Normal range of motion and neck supple.  Cardiovascular:     Rate and Rhythm: Normal rate and regular rhythm.     Pulses: Normal pulses.     Heart sounds: Normal  heart sounds.  Musculoskeletal:     Comments: Normal Muscle Bulk and Muscle Testing Reveals:  Upper Extremities: Full ROM and Muscle Strength 5/5 Left AC Joint Tenderness Lower Extremities: Full ROM and Muscle Strength 5/5 Arises from Table with ease Narrow Based Gait   Skin:    General: Skin is warm and dry.  Neurological:     Mental Status: She is alert and oriented to person, place, and time.  Psychiatric:        Mood and Affect: Mood normal.        Behavior: Behavior normal.           Assessment & Plan:  1.Complex regional pain syndrome right lower extremity postoperative. She's s/p post plantar fascial release. She continue withsensitivity totouch. She has diffuse numbness and tingling.ContinueGabapentin.04/12/2019. RX: Nucynta 100 mg one capsules every 6 hours as needed for pain. DiscontinueOxycodone.We will continue the opioid monitoring program, this consists of regular clinic visits, examinations, urine drug screen, pill counts as well as use of [place] Controlled Substance reporting System. 2.Myofascial  pain syndrome:Continuecurrent medication regime withTizanidine and ice, heat and exercise regime. 04/12/2019 3. Depression: Continue: Zoloft.PCP Following.04/12/2019 4. Cervical Post Laminectomy: Continue to Monitor.04/12/2019 5.Cervicalgia/Cervical Radiculopathy/ Bilateral Hand Pain, Left arm and left hand with tingling and burning.Continuecurrent medication regime withGabapentin 04/12/2019 6. Peripheral Neuropathy:ContinueGabapentin:Continue to Monitor.04/12/2019 7.Chronic LeftShoulder Pain:OrthoFollowing.Continue current medication regime and Continue to Monitor. 04/12/2019 8. Right hand pain/ Post Surgical Procedure:No complaints today.Trigger Finger Release: Dr. Jerilee Hoh Following.04/12/2019. 9. Tremor: Gabapentin decreased to 600 mg five times a day. She has  Neurology Following11/19/2020  15 minutes of face to face patient care time was spent during this visit. All questions were encouraged and answered.  F/U in 1 month

## 2019-04-13 ENCOUNTER — Encounter: Payer: Self-pay | Admitting: Registered Nurse

## 2019-05-07 ENCOUNTER — Encounter: Payer: 59 | Attending: Physical Medicine & Rehabilitation | Admitting: Registered Nurse

## 2019-05-07 ENCOUNTER — Encounter: Payer: Self-pay | Admitting: Registered Nurse

## 2019-05-07 ENCOUNTER — Other Ambulatory Visit: Payer: Self-pay

## 2019-05-07 VITALS — BP 122/83 | HR 94 | Temp 97.7°F | Ht 67.0 in | Wt 271.8 lb

## 2019-05-07 DIAGNOSIS — M797 Fibromyalgia: Secondary | ICD-10-CM | POA: Diagnosis not present

## 2019-05-07 DIAGNOSIS — M25512 Pain in left shoulder: Secondary | ICD-10-CM

## 2019-05-07 DIAGNOSIS — G8929 Other chronic pain: Secondary | ICD-10-CM

## 2019-05-07 DIAGNOSIS — M5412 Radiculopathy, cervical region: Secondary | ICD-10-CM

## 2019-05-07 DIAGNOSIS — Z5181 Encounter for therapeutic drug level monitoring: Secondary | ICD-10-CM | POA: Diagnosis not present

## 2019-05-07 DIAGNOSIS — M7918 Myalgia, other site: Secondary | ICD-10-CM

## 2019-05-07 DIAGNOSIS — M961 Postlaminectomy syndrome, not elsewhere classified: Secondary | ICD-10-CM | POA: Diagnosis not present

## 2019-05-07 DIAGNOSIS — G90521 Complex regional pain syndrome I of right lower limb: Secondary | ICD-10-CM | POA: Diagnosis not present

## 2019-05-07 DIAGNOSIS — Z79891 Long term (current) use of opiate analgesic: Secondary | ICD-10-CM

## 2019-05-07 DIAGNOSIS — G894 Chronic pain syndrome: Secondary | ICD-10-CM

## 2019-05-07 DIAGNOSIS — M542 Cervicalgia: Secondary | ICD-10-CM

## 2019-05-07 MED ORDER — NUCYNTA 100 MG PO TABS: 1.0000 | ORAL_TABLET | Freq: Four times a day (QID) | ORAL | 0 refills | Status: DC | PRN

## 2019-05-07 NOTE — Progress Notes (Signed)
Subjective:    Patient ID: Sabrina Williams, female    DOB: May 14, 1971, 48 y.o.   MRN: [id number]  HPI: Sabrina Williams is a 48 y.o. female who returns for follow up appointment for chronic pain and medication refill. She states her pain is located in her bilateral hands with tingling and burning, neck radiating into her left shoulder with tingling and burning, lower back pain and bilateral feet with tingling and burning. She rates her pain 3. Her current exercise regime is walking and performing stretching exercises.  Sabrina Williams Morphine equivalent is 160.00  MME.  Last UDS was Performed on 01/18/2019, it was consistent.   Pain Inventory Average Pain 4 Pain Right Now 3 My pain is constant, burning, dull, tingling and aching  In the last 24 hours, has pain interfered with the following? General activity 5 Relation with others 3 Enjoyment of life 4 What TIME of day is your pain at its worst? evening Sleep (in general) Fair  Pain is worse with: standing and some activites Pain improves with: rest, heat/ice, therapy/exercise and medication Relief from Meds: 7  Mobility walk without assistance how many minutes can you walk? 45-60  Function not employed: date last employed . I need assistance with the following:  meal prep, household duties and shopping  Neuro/Psych No problems in this area  Prior Studies Any changes since last visit?  no  Physicians involved in your care Any changes since last visit?  no   Family History  Problem Relation Age of Onset  . Cancer Mother        multiple myeloma  . Diabetes Father   . Kidney disease Father   . Heart disease Father    Social History   Socioeconomic History  . Marital status: Married    Spouse name: Not on file  . Number of children: 2  . Years of education: some college  . Highest education level: Not on file  Occupational History  . Occupation: homemaker  Tobacco Use  . Smoking status: Never Smoker  .  Smokeless tobacco: Never Used  Substance and Sexual Activity  . Alcohol use: No    Alcohol/week: 0.0 standard drinks  . Drug use: No  . Sexual activity: Not on file  Other Topics Concern  . Not on file  Social History Narrative   Lives at home with husband.   Right-handed.   No daily caffeine use.   Social Determinants of Health   Financial Resource Strain:   . Difficulty of Paying Living Expenses: Not on file  Food Insecurity:   . Worried About Charity fundraiser in the Last Year: Not on file  . Ran Out of Food in the Last Year: Not on file  Transportation Needs:   . Lack of Transportation (Medical): Not on file  . Lack of Transportation (Non-Medical): Not on file  Physical Activity:   . Days of Exercise per Week: Not on file  . Minutes of Exercise per Session: Not on file  Stress:   . Feeling of Stress : Not on file  Social Connections:   . Frequency of Communication with Friends and Family: Not on file  . Frequency of Social Gatherings with Friends and Family: Not on file  . Attends Religious Services: Not on file  . Active Member of Clubs or Organizations: Not on file  . Attends Archivist Meetings: Not on file  . Marital Status: Not on file   Past Surgical History:  Procedure Laterality Date  . CERVICAL SPINE SURGERY     C6-7 fusion  . CESAREAN SECTION     x2  . dental implant  July 2015  . ENDOSCOPIC PLANTAR FASCIOTOMY Right August 2015  . GASTRIC BYPASS    . HAND SURGERY Right   . LITHOTRIPSY     multiple  . PLANTAR FASCIECTOMY Right   . SHOULDER SURGERY Left 10/2004  . SMALL INTESTINE SURGERY     cervical laminectomy  . utereroscopy  1995   Past Medical History:  Diagnosis Date  . Anxiety   . Chronic kidney disease    stones  . Chronic pain   . Complex regional pain syndrome I    right foot  . Depression   . Diabetes mellitus   . Hypertension   . Kidney stone   . Neuromuscular disorder ([place])   . Tremor    BP 122/83   Pulse 94    Temp 97.7 F (36.5 C)   Ht '5\' 7"'$  (1.702 m)   Wt 271 lb 12.8 oz (123.3 kg)   SpO2 98%   BMI 42.57 kg/m   Opioid Risk Score:   Fall Risk Score:  `1  Depression screen PHQ 2/9  Depression screen [facility] 2/9 07/10/2018 06/12/2018 02/17/2018 07/01/2017 03/11/2017 02/14/2017 12/03/2016  Decreased Interest 0 0 0 0 0 0 0  Down, Depressed, Hopeless 0 0 0 0 0 0 0  PHQ - 2 Score 0 0 0 0 0 0 0  Altered sleeping - - - - - - -  Tired, decreased energy - - - - - - -  Change in appetite - - - - - - -  Feeling bad or failure about yourself  - - - - - - -  Trouble concentrating - - - - - - -  Moving slowly or fidgety/restless - - - - - - -  Suicidal thoughts - - - - - - -  PHQ-9 Score - - - - - - -    Review of Systems     Objective:   Physical Exam Vitals and nursing note reviewed.  Constitutional:      Appearance: Normal appearance.  Cardiovascular:     Rate and Rhythm: Normal rate and regular rhythm.     Pulses: Normal pulses.     Heart sounds: Normal heart sounds.  Pulmonary:     Effort: Pulmonary effort is normal.     Breath sounds: Normal breath sounds.  Musculoskeletal:     Cervical back: Neck supple.     Comments: Normal Muscle Bulk and Muscle Testing Reveals:  Upper Extremities: Full ROM and Muscle Strength 5/5 Left AC Joint Tenderness  Thoracic Paraspinal Tenderness: T-1-T-7 Mainly Left Side Lower Extremities: Full ROM and Muscle Strength 5/5 Arises from chair with ease  Narrow Based Gait   Skin:    General: Skin is warm and dry.  Neurological:     Mental Status: She is alert and oriented to person, place, and time.  Psychiatric:        Mood and Affect: Mood normal.        Behavior: Behavior normal.           Assessment & Plan:  1.Complex regional pain syndrome right lower extremity postoperative. She's s/p post plantar fascial release. She continue withsensitivity totouch. She has diffuse numbness and tingling.ContinueGabapentin.05/07/2019. Refilled: Nucynta 100  mg one capsules every 6 hours as needed for pain. We will continue the opioid monitoring program, this consists of  regular clinic visits, examinations, urine drug screen, pill counts as well as use of [place] Controlled Substance reporting System. 2.Myofascial pain syndrome:Continuecurrent medication regime withTizanidine and ice, heat and exercise regime.05/07/2019 3. Depression: Continue: Zoloft.PCP Following.05/07/2019 4. Cervical Post Laminectomy: Continue to Monitor.05/07/2019 5.Cervicalgia/Cervical Radiculopathy/ Bilateral Hand Pain, Leftarm and lefthand with tingling and burning.Continuecurrent medication regime withGabapentin12/14/2020 6. Peripheral Neuropathy:ContinueGabapentin:Continue to Monitor.05/07/2019 7.Chronic LeftShoulder Pain:OrthoFollowing.Continue current medication regime and Continue to Monitor.05/07/2019 8. Right hand pain/ Post Surgical Procedure:Trigger Finger Release: Dr. Jerilee Hoh Following.05/07/2019. 9. Tremor:Gabapentin decreased to 600 mg five times a day. She has Neurology Following12/14/2020  15 minutes of face to face patient care time was spent during this visit. All questions were encouraged and answered.  F/U in 1 month

## 2019-05-11 ENCOUNTER — Ambulatory Visit: Payer: 59 | Admitting: Registered Nurse

## 2019-06-05 ENCOUNTER — Encounter: Payer: 59 | Admitting: Registered Nurse

## 2019-06-06 ENCOUNTER — Encounter: Payer: 59 | Attending: Physical Medicine & Rehabilitation | Admitting: Registered Nurse

## 2019-06-06 ENCOUNTER — Other Ambulatory Visit: Payer: Self-pay

## 2019-06-06 ENCOUNTER — Encounter: Payer: Self-pay | Admitting: Registered Nurse

## 2019-06-06 VITALS — BP 108/75 | HR 98 | Temp 97.5°F | Ht 68.0 in | Wt 275.0 lb

## 2019-06-06 DIAGNOSIS — M961 Postlaminectomy syndrome, not elsewhere classified: Secondary | ICD-10-CM | POA: Diagnosis present

## 2019-06-06 DIAGNOSIS — M797 Fibromyalgia: Secondary | ICD-10-CM | POA: Insufficient documentation

## 2019-06-06 DIAGNOSIS — Z5181 Encounter for therapeutic drug level monitoring: Secondary | ICD-10-CM | POA: Diagnosis not present

## 2019-06-06 DIAGNOSIS — M5412 Radiculopathy, cervical region: Secondary | ICD-10-CM | POA: Insufficient documentation

## 2019-06-06 DIAGNOSIS — Z79891 Long term (current) use of opiate analgesic: Secondary | ICD-10-CM

## 2019-06-06 DIAGNOSIS — G90521 Complex regional pain syndrome I of right lower limb: Secondary | ICD-10-CM | POA: Insufficient documentation

## 2019-06-06 DIAGNOSIS — R200 Anesthesia of skin: Secondary | ICD-10-CM

## 2019-06-06 DIAGNOSIS — G8929 Other chronic pain: Secondary | ICD-10-CM

## 2019-06-06 DIAGNOSIS — M1711 Unilateral primary osteoarthritis, right knee: Secondary | ICD-10-CM

## 2019-06-06 DIAGNOSIS — R202 Paresthesia of skin: Secondary | ICD-10-CM

## 2019-06-06 DIAGNOSIS — M79641 Pain in right hand: Secondary | ICD-10-CM

## 2019-06-06 DIAGNOSIS — G894 Chronic pain syndrome: Secondary | ICD-10-CM

## 2019-06-06 DIAGNOSIS — M542 Cervicalgia: Secondary | ICD-10-CM

## 2019-06-06 DIAGNOSIS — M7918 Myalgia, other site: Secondary | ICD-10-CM

## 2019-06-06 DIAGNOSIS — M25512 Pain in left shoulder: Secondary | ICD-10-CM

## 2019-06-06 MED ORDER — NUCYNTA 100 MG PO TABS: 1.0000 | ORAL_TABLET | Freq: Four times a day (QID) | ORAL | 0 refills | Status: DC | PRN

## 2019-06-06 MED ORDER — GABAPENTIN 600 MG PO TABS: 600.0000 mg | ORAL_TABLET | Freq: Every day | ORAL | 3 refills | Status: DC

## 2019-06-06 NOTE — Progress Notes (Signed)
Subjective:    Patient ID: Sabrina Williams, female    DOB: 06-07-1970, 49 y.o.   MRN: [id number]  HPI: Sabrina Williams is a 49 y.o. female who returns for follow up appointment for chronic pain and medication refill. She states her pain is located in her neck radiating into her left shoulder, right knee and right foot pain. Also reports bilateral hand pain. She rates her pain 3. Her current exercise regime is walking and performing stretching exercises.  Sabrina Williams Morphine equivalent is 160.00 MME.  Last UDS was Performed on 01/18/2019, it was consistent.   Pain Inventory Average Pain 3 Pain Right Now 3 My pain is constant, sharp, burning, dull, stabbing, tingling and aching  In the last 24 hours, has pain interfered with the following? General activity 6 Relation with others 4 Enjoyment of life 5 What TIME of day is your pain at its worst? evening Sleep (in general) Fair  Pain is worse with: walking, standing and some activites Pain improves with: rest, heat/ice, therapy/exercise and medication Relief from Meds: 7  Mobility walk without assistance  Function I need assistance with the following:  meal prep, household duties and shopping  Neuro/Psych No problems in this area  Prior Studies Any changes since last visit?  no  Physicians involved in your care Any changes since last visit?  no   Family History  Problem Relation Age of Onset  . Cancer Mother        multiple myeloma  . Diabetes Father   . Kidney disease Father   . Heart disease Father    Social History   Socioeconomic History  . Marital status: Married    Spouse name: Not on file  . Number of children: 2  . Years of education: some college  . Highest education level: Not on file  Occupational History  . Occupation: homemaker  Tobacco Use  . Smoking status: Never Smoker  . Smokeless tobacco: Never Used  Substance and Sexual Activity  . Alcohol use: No    Alcohol/week: 0.0 standard drinks    . Drug use: No  . Sexual activity: Not on file  Other Topics Concern  . Not on file  Social History Narrative   Lives at home with husband.   Right-handed.   No daily caffeine use.   Social Determinants of Health   Financial Resource Strain:   . Difficulty of Paying Living Expenses: Not on file  Food Insecurity:   . Worried About Charity fundraiser in the Last Year: Not on file  . Ran Out of Food in the Last Year: Not on file  Transportation Needs:   . Lack of Transportation (Medical): Not on file  . Lack of Transportation (Non-Medical): Not on file  Physical Activity:   . Days of Exercise per Week: Not on file  . Minutes of Exercise per Session: Not on file  Stress:   . Feeling of Stress : Not on file  Social Connections:   . Frequency of Communication with Friends and Family: Not on file  . Frequency of Social Gatherings with Friends and Family: Not on file  . Attends Religious Services: Not on file  . Active Member of Clubs or Organizations: Not on file  . Attends Archivist Meetings: Not on file  . Marital Status: Not on file   Past Surgical History:  Procedure Laterality Date  . CERVICAL SPINE SURGERY     C6-7 fusion  . CESAREAN SECTION  x2  . dental implant  July 2015  . ENDOSCOPIC PLANTAR FASCIOTOMY Right August 2015  . GASTRIC BYPASS    . HAND SURGERY Right   . LITHOTRIPSY     multiple  . PLANTAR FASCIECTOMY Right   . SHOULDER SURGERY Left 10/2004  . SMALL INTESTINE SURGERY     cervical laminectomy  . utereroscopy  1995   Past Medical History:  Diagnosis Date  . Anxiety   . Chronic kidney disease    stones  . Chronic pain   . Complex regional pain syndrome I    right foot  . Depression   . Diabetes mellitus   . Hypertension   . Kidney stone   . Neuromuscular disorder ([place])   . Tremor    There were no vitals taken for this visit.  Opioid Risk Score:   Fall Risk Score:  `1  Depression screen PHQ 2/9  Depression screen [facility]  2/9 07/10/2018 06/12/2018 02/17/2018 07/01/2017 03/11/2017 02/14/2017 12/03/2016  Decreased Interest 0 0 0 0 0 0 0  Down, Depressed, Hopeless 0 0 0 0 0 0 0  PHQ - 2 Score 0 0 0 0 0 0 0  Altered sleeping - - - - - - -  Tired, decreased energy - - - - - - -  Change in appetite - - - - - - -  Feeling bad or failure about yourself  - - - - - - -  Trouble concentrating - - - - - - -  Moving slowly or fidgety/restless - - - - - - -  Suicidal thoughts - - - - - - -  PHQ-9 Score - - - - - - -     Review of Systems  Constitutional: Negative.   HENT: Negative.   Eyes: Negative.   Respiratory: Negative.   Cardiovascular: Negative.   Gastrointestinal: Negative.   Endocrine: Negative.   Genitourinary: Negative.   Musculoskeletal: Positive for arthralgias and back pain.  Skin: Negative.   Allergic/Immunologic: Negative.   Neurological: Negative.   Hematological: Negative.   Psychiatric/Behavioral: Negative.   All other systems reviewed and are negative.      Objective:   Physical Exam Vitals and nursing note reviewed.  Constitutional:      Appearance: Normal appearance.  Neck:     Comments: Cervical Paraspinal Tenderness: C-5-C-6 Cardiovascular:     Rate and Rhythm: Normal rate and regular rhythm.     Pulses: Normal pulses.     Heart sounds: Normal heart sounds.  Pulmonary:     Effort: Pulmonary effort is normal.     Breath sounds: Normal breath sounds.  Musculoskeletal:     Cervical back: Normal range of motion and neck supple.     Comments: Normal Muscle Bulk and Muscle Testing Reveals:  Upper Extremities: Full ROM and Muscle Strength 5/5 Left AC Joint Tenderness  Thoracic Paraspinal Tenderness: T-1-T-2 Mainly Right Side  Lower Extremities: Full ROM and Muscle Strength 5/5 Arises from Table with ease Narrow Based  Gait   Skin:    General: Skin is warm and dry.  Neurological:     Mental Status: She is alert and oriented to person, place, and time.  Psychiatric:        Mood  and Affect: Mood normal.        Behavior: Behavior normal.           Assessment & Plan:  1.Complex regional pain syndrome right lower extremity postoperative. She's s/p post plantar fascial release.  She continue withsensitivity totouch. She has diffuse numbness and tingling.ContinueGabapentin.06/06/2019. Refilled: Nucynta 100 mg one capsules every 6 hours as needed for pain. We will continue the opioid monitoring program, this consists of regular clinic visits, examinations, urine drug screen, pill counts as well as use of [place] Controlled Substance reporting System. 2.Myofascial pain syndrome:Continuecurrent medication regime withTizanidine and ice, heat and exercise regime.06/06/2019 3. Depression: Continue: Zoloft.PCP Following.06/06/2019 4. Cervical Post Laminectomy: Continue to Monitor.06/06/2019 5.Cervicalgia/Cervical Radiculopathy/ Bilateral Hand Pain, Leftarm and lefthand with tingling and burning.Continuecurrent medication regime withGabapentin01/13/2021 6. Peripheral Neuropathy:ContinueGabapentin:Continue to Monitor.06/06/2019 7.Chronic LeftShoulder Pain:OrthoFollowing.Continue current medication regime and Continue to Monitor.06/06/2019 8. Right hand pain/ Post Surgical Procedure:Trigger Finger Release: Dr. Jerilee Hoh Following.06/06/2019. 9. Tremor:No complaints today. Continue with Gabapentin decreased to 600 mg five times a day. She has NeurologyFollowing01/13/2021  34mnutes of face to face patient care time was spent during this visit. All questions were encouraged and answered.  F/U in 1 month

## 2019-07-06 ENCOUNTER — Encounter: Payer: 59 | Attending: Physical Medicine & Rehabilitation | Admitting: Registered Nurse

## 2019-07-06 ENCOUNTER — Encounter: Payer: Self-pay | Admitting: Registered Nurse

## 2019-07-06 ENCOUNTER — Other Ambulatory Visit: Payer: Self-pay

## 2019-07-06 VITALS — BP 107/68 | HR 92 | Ht 67.0 in | Wt 278.0 lb

## 2019-07-06 DIAGNOSIS — G90521 Complex regional pain syndrome I of right lower limb: Secondary | ICD-10-CM | POA: Insufficient documentation

## 2019-07-06 DIAGNOSIS — M7918 Myalgia, other site: Secondary | ICD-10-CM | POA: Diagnosis not present

## 2019-07-06 DIAGNOSIS — M961 Postlaminectomy syndrome, not elsewhere classified: Secondary | ICD-10-CM | POA: Diagnosis not present

## 2019-07-06 DIAGNOSIS — M5412 Radiculopathy, cervical region: Secondary | ICD-10-CM | POA: Insufficient documentation

## 2019-07-06 DIAGNOSIS — M797 Fibromyalgia: Secondary | ICD-10-CM | POA: Insufficient documentation

## 2019-07-06 DIAGNOSIS — M25512 Pain in left shoulder: Secondary | ICD-10-CM | POA: Diagnosis not present

## 2019-07-06 DIAGNOSIS — G8929 Other chronic pain: Secondary | ICD-10-CM

## 2019-07-06 DIAGNOSIS — G894 Chronic pain syndrome: Secondary | ICD-10-CM

## 2019-07-06 DIAGNOSIS — Z5181 Encounter for therapeutic drug level monitoring: Secondary | ICD-10-CM | POA: Insufficient documentation

## 2019-07-06 DIAGNOSIS — Z79891 Long term (current) use of opiate analgesic: Secondary | ICD-10-CM

## 2019-07-06 MED ORDER — NUCYNTA 100 MG PO TABS: 1.0000 | ORAL_TABLET | Freq: Four times a day (QID) | ORAL | 0 refills | Status: DC | PRN

## 2019-07-06 NOTE — Progress Notes (Signed)
Subjective:    Patient ID: Sabrina Williams, female    DOB: [birth year], 49 y.o.   MRN: [id number]  HPI: SHAJUANA Williams is a 49 y.o. female whose appointment was changed to a virtual office visit to reduce the risk of exposure to the COVID-19 virus and to help Sabrina Williams remain healthy and safe. The virtual visit will also provide continuity of care. Sabrina Williams agrees with virtual visit and verbalizes understanding.  She states her pain is located in her left shoulder, upper back mainly left side, lower back pain and bilateral knee and bilateral feet pain. She rates her pain 3. Her current exercise regime is walking and performing stretching exercises.  Ms. [name] Morphine equivalent is 160.00  MME.  Last UDS was Performed on 01/18/2019, it was consistent.   Pain Inventory Average Pain 4 Pain Right Now 3 My pain is constant, sharp, burning, dull, stabbing, tingling and aching  In the last 24 hours, has pain interfered with the following? General activity 6 Relation with others 5 Enjoyment of life 4 What TIME of day is your pain at its worst? evening Sleep (in general) Fair  Pain is worse with: standing and some activites Pain improves with: rest, heat/ice and medication Relief from Meds: 7  Mobility walk without assistance how many minutes can you walk? 3 ability to climb steps?  yes do you drive?  yes  Function not employed: date last employed . I need assistance with the following:  meal prep, household duties and shopping  Neuro/Psych bladder control problems weakness numbness tremor tingling spasms anxiety  Prior Studies Any changes since last visit?  no  Physicians involved in your care Any changes since last visit?  no   Family History  Problem Relation Age of Onset  . Cancer Mother        multiple myeloma  . Diabetes Father   . Kidney disease Father   . Heart disease Father    Social History   Socioeconomic History  . Marital status: Married    Spouse name: Not on file  . Number of children: 2  . Years of education: some college  . Highest education level: Not on file  Occupational History  . Occupation: homemaker  Tobacco Use  . Smoking status: Never Smoker  . Smokeless tobacco: Never Used  Substance and Sexual Activity  . Alcohol use: No    Alcohol/week: 0.0 standard drinks  . Drug use: No  . Sexual activity: Not on file  Other Topics Concern  . Not on file  Social History Narrative   Lives at home with husband.   Right-handed.   No daily caffeine use.   Social Determinants of Health   Financial Resource Strain:   . Difficulty of Paying Living Expenses: Not on file  Food Insecurity:   . Worried About Charity fundraiser in the Last Year: Not on file  . Ran Out of Food in the Last Year: Not on file  Transportation Needs:   . Lack of Transportation (Medical): Not on file  . Lack of Transportation (Non-Medical): Not on file  Physical Activity:   . Days of Exercise per Week: Not on file  . Minutes of Exercise per Session: Not on file  Stress:   . Feeling of Stress : Not on file  Social Connections:   . Frequency of Communication with Friends and Family: Not on file  . Frequency of Social Gatherings with Friends and Family: Not on file  .  Attends Religious Services: Not on file  . Active Member of Clubs or Organizations: Not on file  . Attends Archivist Meetings: Not on file  . Marital Status: Not on file   Past Surgical History:  Procedure Laterality Date  . CERVICAL SPINE SURGERY     C6-7 fusion  . CESAREAN SECTION     x2  . dental implant  July 2015  . ENDOSCOPIC PLANTAR FASCIOTOMY Right August 2015  . GASTRIC BYPASS    . HAND SURGERY Right   . LITHOTRIPSY     multiple  . PLANTAR FASCIECTOMY Right   . SHOULDER SURGERY Left 10/2004  . SMALL INTESTINE SURGERY     cervical laminectomy  . utereroscopy  1995   Past Medical History:  Diagnosis Date  . Anxiety   . Chronic kidney disease     stones  . Chronic pain   . Complex regional pain syndrome I    right foot  . Depression   . Diabetes mellitus   . Hypertension   . Kidney stone   . Neuromuscular disorder ([place])   . Tremor    There were no vitals taken for this visit.  Opioid Risk Score:   Fall Risk Score:  `1  Depression screen PHQ 2/9  Depression screen [facility] 2/9 07/10/2018 06/12/2018 02/17/2018 07/01/2017 03/11/2017 02/14/2017 12/03/2016  Decreased Interest 0 0 0 0 0 0 0  Down, Depressed, Hopeless 0 0 0 0 0 0 0  PHQ - 2 Score 0 0 0 0 0 0 0  Altered sleeping - - - - - - -  Tired, decreased energy - - - - - - -  Change in appetite - - - - - - -  Feeling bad or failure about yourself  - - - - - - -  Trouble concentrating - - - - - - -  Moving slowly or fidgety/restless - - - - - - -  Suicidal thoughts - - - - - - -  PHQ-9 Score - - - - - - -    Review of Systems  Constitutional: Negative.   HENT: Negative.   Eyes: Negative.   Gastrointestinal: Negative.   Endocrine: Negative.   Genitourinary: Positive for difficulty urinating.  Musculoskeletal: Positive for arthralgias.  Skin: Negative.   Allergic/Immunologic: Negative.   Neurological: Positive for tremors, weakness, numbness and headaches.       Tingling  Psychiatric/Behavioral: The patient is nervous/anxious.        Objective:   Physical Exam Vitals and nursing note reviewed.  Musculoskeletal:     Comments: No Physical Exam Performed: Virtual Visit           Assessment & Plan:  1.Complex regional pain syndrome right lower extremity postoperative. She's s/p post plantar fascial release. She continue withsensitivity totouch. She has diffuse numbness and tingling.ContinueGabapentin.07/06/2019. Refilled: Nucynta 100 mg one capsules every 6 hours as needed for pain. We will continue the opioid monitoring program, this consists of regular clinic visits, examinations, urine drug screen, pill counts as well as use of [place] Controlled  Substance reporting System. 2.Myofascial pain syndrome:Continuecurrent medication regime withTizanidine and ice, heat and exercise regime.07/06/2019 3. Depression: Continue: Zoloft.PCP Following.07/06/2019 4. Cervical Post Laminectomy: Continue to Monitor.07/06/2019 5.Cervicalgia/Cervical Radiculopathy/ Bilateral Hand Pain, Leftarm and lefthand with tingling and burning.Continuecurrent medication regime withGabapentin02/04/2020 6. Peripheral Neuropathy:ContinueGabapentin:Continue to Monitor.07/06/2019 7.Chronic LeftShoulder Pain:OrthoFollowing.Continue current medication regime and Continue to Monitor.07/06/2019 8. Right hand pain/ Post Surgical Procedure:Trigger Finger Release: Dr. Jerilee Hoh Following.07/06/2019. 9. Tremor:No  complaints today. Continue with Gabapentin decreased to 600 mg five times a day. She has NeurologyFollowing02/04/2020  F/U in 1 month  Web-Ex Established Patient Location of Patient in her home Location of Provider: In the Office Total Time Spent: 10 minutes
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
## 2019-08-02 ENCOUNTER — Telehealth: Payer: Self-pay

## 2019-08-02 ENCOUNTER — Ambulatory Visit: Payer: 59 | Admitting: Neurology

## 2019-08-02 NOTE — Telephone Encounter (Signed)
Patient was a no call/no show for their appointment today.   

## 2019-08-03 ENCOUNTER — Encounter: Payer: 59 | Admitting: Registered Nurse

## 2019-08-04 ENCOUNTER — Other Ambulatory Visit: Payer: Self-pay | Admitting: Physical Medicine & Rehabilitation

## 2019-08-06 ENCOUNTER — Encounter: Payer: Self-pay | Admitting: Neurology

## 2019-08-08 ENCOUNTER — Encounter: Payer: Self-pay | Admitting: Registered Nurse

## 2019-08-08 ENCOUNTER — Other Ambulatory Visit: Payer: Self-pay

## 2019-08-08 ENCOUNTER — Encounter: Payer: 59 | Attending: Physical Medicine & Rehabilitation | Admitting: Registered Nurse

## 2019-08-08 VITALS — BP 109/75 | HR 83 | Temp 97.7°F | Ht 67.0 in | Wt 284.4 lb

## 2019-08-08 DIAGNOSIS — M961 Postlaminectomy syndrome, not elsewhere classified: Secondary | ICD-10-CM

## 2019-08-08 DIAGNOSIS — R200 Anesthesia of skin: Secondary | ICD-10-CM

## 2019-08-08 DIAGNOSIS — M797 Fibromyalgia: Secondary | ICD-10-CM | POA: Diagnosis not present

## 2019-08-08 DIAGNOSIS — R202 Paresthesia of skin: Secondary | ICD-10-CM

## 2019-08-08 DIAGNOSIS — G894 Chronic pain syndrome: Secondary | ICD-10-CM

## 2019-08-08 DIAGNOSIS — M546 Pain in thoracic spine: Secondary | ICD-10-CM

## 2019-08-08 DIAGNOSIS — Z5181 Encounter for therapeutic drug level monitoring: Secondary | ICD-10-CM | POA: Diagnosis present

## 2019-08-08 DIAGNOSIS — M542 Cervicalgia: Secondary | ICD-10-CM

## 2019-08-08 DIAGNOSIS — M1712 Unilateral primary osteoarthritis, left knee: Secondary | ICD-10-CM

## 2019-08-08 DIAGNOSIS — Z79891 Long term (current) use of opiate analgesic: Secondary | ICD-10-CM | POA: Diagnosis not present

## 2019-08-08 DIAGNOSIS — M25512 Pain in left shoulder: Secondary | ICD-10-CM

## 2019-08-08 DIAGNOSIS — G90521 Complex regional pain syndrome I of right lower limb: Secondary | ICD-10-CM | POA: Diagnosis not present

## 2019-08-08 DIAGNOSIS — M5412 Radiculopathy, cervical region: Secondary | ICD-10-CM | POA: Diagnosis not present

## 2019-08-08 DIAGNOSIS — M1711 Unilateral primary osteoarthritis, right knee: Secondary | ICD-10-CM

## 2019-08-08 DIAGNOSIS — G8929 Other chronic pain: Secondary | ICD-10-CM

## 2019-08-08 MED ORDER — NUCYNTA 100 MG PO TABS: 1.0000 | ORAL_TABLET | Freq: Four times a day (QID) | ORAL | 0 refills | Status: DC | PRN

## 2019-08-08 NOTE — Progress Notes (Signed)
Subjective:    Patient ID: Sabrina Williams, female    DOB: 1970-09-14, 49 y.o.   MRN: [id number]  HPI: Sabrina Williams is a 49 y.o. female who returns for follow up appointment for chronic pain and medication refill. She states her pain is located in her neck radiating into her bilateral shoulders, left hand with occassionally tingling, upper- lower back pain and bilateral knee pain. Also reports bilateral hand pain. She rates her pain 3. Her current exercise regime is walking and performing stretching exercises.  Ms. Yoshimura Morphine equivalent is 160.00 MME.  UDS ordered today  Pain Inventory Average Pain 4 Pain Right Now 3 My pain is constant, sharp, burning, dull, stabbing, tingling and aching  In the last 24 hours, has pain interfered with the following? General activity 6 Relation with others 4 Enjoyment of life 4 What TIME of day is your pain at its worst? evening Sleep (in general) Fair  Pain is worse with: walking, standing and some activites Pain improves with: rest, heat/ice, therapy/exercise, pacing activities and medication Relief from Meds: 7  Mobility walk without assistance how many minutes can you walk? 20-30 ability to climb steps?  yes do you drive?  yes  Function not employed: date last employed . I need assistance with the following:  meal prep, household duties and shopping  Neuro/Psych tremor spasms  Prior Studies Any changes since last visit?  no  Physicians involved in your care Any changes since last visit?  no   Family History  Problem Relation Age of Onset  . Cancer Mother        multiple myeloma  . Diabetes Father   . Kidney disease Father   . Heart disease Father    Social History   Socioeconomic History  . Marital status: Married    Spouse name: Not on file  . Number of children: 2  . Years of education: some college  . Highest education level: Not on file  Occupational History  . Occupation: homemaker  Tobacco Use  .  Smoking status: Never Smoker  . Smokeless tobacco: Never Used  Substance and Sexual Activity  . Alcohol use: No    Alcohol/week: 0.0 standard drinks  . Drug use: No  . Sexual activity: Not on file  Other Topics Concern  . Not on file  Social History Narrative   Lives at home with husband.   Right-handed.   No daily caffeine use.   Social Determinants of Health   Financial Resource Strain:   . Difficulty of Paying Living Expenses:   Food Insecurity:   . Worried About Charity fundraiser in the Last Year:   . Arboriculturist in the Last Year:   Transportation Needs:   . Film/video editor (Medical):   Marland Kitchen Lack of Transportation (Non-Medical):   Physical Activity:   . Days of Exercise per Week:   . Minutes of Exercise per Session:   Stress:   . Feeling of Stress :   Social Connections:   . Frequency of Communication with Friends and Family:   . Frequency of Social Gatherings with Friends and Family:   . Attends Religious Services:   . Active Member of Clubs or Organizations:   . Attends Archivist Meetings:   Marland Kitchen Marital Status:    Past Surgical History:  Procedure Laterality Date  . CERVICAL SPINE SURGERY     C6-7 fusion  . CESAREAN SECTION     x2  .  dental implant  July 2015  . ENDOSCOPIC PLANTAR FASCIOTOMY Right August 2015  . GASTRIC BYPASS    . HAND SURGERY Right   . LITHOTRIPSY     multiple  . PLANTAR FASCIECTOMY Right   . SHOULDER SURGERY Left 10/2004  . SMALL INTESTINE SURGERY     cervical laminectomy  . utereroscopy  1995   Past Medical History:  Diagnosis Date  . Anxiety   . Chronic kidney disease    stones  . Chronic pain   . Complex regional pain syndrome I    right foot  . Depression   . Diabetes mellitus   . Hypertension   . Kidney stone   . Neuromuscular disorder ([place])   . Tremor    BP 109/75   Pulse 83   Temp 97.7 F (36.5 C)   Ht _0  (1.702 m)   Wt 284 lb 6.4 oz (129 kg)   SpO2 96%   BMI 44.54 kg/m   Opioid Risk  Score:   Fall Risk Score:  `1  Depression screen PHQ 2/9  Depression screen [facility] 2/9 07/10/2018 06/12/2018 02/17/2018 07/01/2017 03/11/2017 02/14/2017 12/03/2016  Decreased Interest 0 0 0 0 0 0 0  Down, Depressed, Hopeless 0 0 0 0 0 0 0  PHQ - 2 Score 0 0 0 0 0 0 0  Altered sleeping - - - - - - -  Tired, decreased energy - - - - - - -  Change in appetite - - - - - - -  Feeling bad or failure about yourself  - - - - - - -  Trouble concentrating - - - - - - -  Moving slowly or fidgety/restless - - - - - - -  Suicidal thoughts - - - - - - -  PHQ-9 Score - - - - - - -    Review of Systems  Neurological: Positive for tremors.       Spasms  All other systems reviewed and are negative.      Objective:   Physical Exam Vitals and nursing note reviewed.  Constitutional:      Appearance: Normal appearance. She is obese.  Cardiovascular:     Rate and Rhythm: Normal rate and regular rhythm.     Pulses: Normal pulses.     Heart sounds: Normal heart sounds.  Pulmonary:     Effort: Pulmonary effort is normal.     Breath sounds: Normal breath sounds.  Musculoskeletal:     Cervical back: Normal range of motion and neck supple.     Comments: Normal Muscle Bulk and Muscle Testing Reveals:  Upper Extremities: Full ROM and Muscle Strength 5/5 Thoracic Paraspinal Tenderness: T-3-T-4 Mainly Right Side Lower Extremities: Full ROM and Muscle Strength 5/5 Bilateral Lower Extremities Flexion Produces Pain into her bilateral feet. Arises from Table with ease Narrow Based Gait   Skin:    General: Skin is warm and dry.  Neurological:     Mental Status: She is alert and oriented to person, place, and time.  Psychiatric:        Mood and Affect: Mood normal.        Behavior: Behavior normal.           Assessment & Plan:  1.Complex regional pain syndrome right lower extremity postoperative. She's s/p post plantar fascial release. She continue withsensitivity totouch. She has diffuse numbness and  tingling.ContinueGabapentin.08/08/2019. Refilled: Nucynta 100 mg one capsules every 6 hours as needed for pain. We will  continue the opioid monitoring program, this consists of regular clinic visits, examinations, urine drug screen, pill counts as well as use of [place] Controlled Substance reporting System. 2.Myofascial pain syndrome:Continuecurrent medication regime withTizanidine and ice, heat and exercise regime.08/08/2019 3. Depression: Continue: Zoloft.PCP Following.08/08/2019 4. Cervical Post Laminectomy: Continue to Monitor.08/08/2019 5.Cervicalgia/Cervical Radiculopathy/ Bilateral Hand Pain, Leftarm and lefthand with tingling and burning.Continuecurrent medication regime withGabapentin03/17/2021 6. Peripheral Neuropathy:ContinueGabapentin:Continue to Monitor.08/08/2019 7.Chronic LeftShoulder Pain:OrthoFollowing.Continue current medication regime and Continue to Monitor.08/08/2019 8. Right hand pain/ Post Surgical Procedure:Trigger Finger Release: Dr. Jerilee Hoh Following.08/08/2019. 9. Tremor:No complaints today. Continue withGabapentin decreased to 600 mg five times a day. She has NeurologyFollowing03/17/2021  F/U in 1 month  15 minutes of face to face patient care time was spent during this visit. All questions were encouraged and answered.

## 2019-08-14 LAB — TOXASSURE SELECT,+ANTIDEPR,UR

## 2019-08-15 ENCOUNTER — Telehealth: Payer: Self-pay

## 2019-08-15 NOTE — Telephone Encounter (Signed)
UDS RESULTS CONSISTENT WITH MEDICATIONS ON FILE  

## 2019-09-07 ENCOUNTER — Encounter: Payer: 59 | Attending: Physical Medicine & Rehabilitation | Admitting: Registered Nurse

## 2019-09-07 ENCOUNTER — Encounter: Payer: Self-pay | Admitting: Registered Nurse

## 2019-09-07 ENCOUNTER — Other Ambulatory Visit: Payer: Self-pay

## 2019-09-07 VITALS — BP 109/68 | HR 94 | Temp 98.2°F | Ht 67.0 in | Wt 287.0 lb

## 2019-09-07 DIAGNOSIS — M542 Cervicalgia: Secondary | ICD-10-CM | POA: Diagnosis not present

## 2019-09-07 DIAGNOSIS — G90521 Complex regional pain syndrome I of right lower limb: Secondary | ICD-10-CM | POA: Insufficient documentation

## 2019-09-07 DIAGNOSIS — Z79891 Long term (current) use of opiate analgesic: Secondary | ICD-10-CM | POA: Insufficient documentation

## 2019-09-07 DIAGNOSIS — M961 Postlaminectomy syndrome, not elsewhere classified: Secondary | ICD-10-CM | POA: Insufficient documentation

## 2019-09-07 DIAGNOSIS — M797 Fibromyalgia: Secondary | ICD-10-CM | POA: Insufficient documentation

## 2019-09-07 DIAGNOSIS — R200 Anesthesia of skin: Secondary | ICD-10-CM

## 2019-09-07 DIAGNOSIS — R202 Paresthesia of skin: Secondary | ICD-10-CM

## 2019-09-07 DIAGNOSIS — M5412 Radiculopathy, cervical region: Secondary | ICD-10-CM | POA: Insufficient documentation

## 2019-09-07 DIAGNOSIS — M79672 Pain in left foot: Secondary | ICD-10-CM

## 2019-09-07 DIAGNOSIS — M1712 Unilateral primary osteoarthritis, left knee: Secondary | ICD-10-CM

## 2019-09-07 DIAGNOSIS — G894 Chronic pain syndrome: Secondary | ICD-10-CM | POA: Insufficient documentation

## 2019-09-07 DIAGNOSIS — G8929 Other chronic pain: Secondary | ICD-10-CM

## 2019-09-07 DIAGNOSIS — M25511 Pain in right shoulder: Secondary | ICD-10-CM

## 2019-09-07 DIAGNOSIS — M25512 Pain in left shoulder: Secondary | ICD-10-CM

## 2019-09-07 DIAGNOSIS — Z5181 Encounter for therapeutic drug level monitoring: Secondary | ICD-10-CM | POA: Diagnosis present

## 2019-09-07 DIAGNOSIS — M1711 Unilateral primary osteoarthritis, right knee: Secondary | ICD-10-CM

## 2019-09-07 MED ORDER — NUCYNTA 100 MG PO TABS: 1.0000 | ORAL_TABLET | Freq: Four times a day (QID) | ORAL | 0 refills | Status: DC | PRN

## 2019-09-07 MED ORDER — GABAPENTIN 600 MG PO TABS: 600.0000 mg | ORAL_TABLET | Freq: Every day | ORAL | 3 refills | Status: DC

## 2019-09-07 NOTE — Progress Notes (Signed)
Subjective:    Patient ID: Sabrina Williams, female    DOB: [birth year], 49 y.o.   MRN: [id number]  HPI: Sabrina Williams is a 49 y.o. female who returns for follow up appointment for chronic pain and medication refill. She states her pain is located in her neck radiating into her bilateral shoulders and left arm and left hand with tingling and bilateral knee pain and left foot pain. She rates her pain 4. Her current exercise regime is walking.  Ms. Ketterman Morphine equivalent is 160.00 MME.   Last UDS was Performed on 08/08/2019, it was consistent.    Pain Inventory Average Pain 6 Pain Right Now 4 My pain is intermittent, constant, sharp, burning, dull, stabbing, tingling and aching  In the last 24 hours, has pain interfered with the following? General activity 7 Relation with others 5 Enjoyment of life 7 What TIME of day is your pain at its worst? morning and evening Sleep (in general) Fair  Pain is worse with: walking, standing and some activites Pain improves with: rest, heat/ice, therapy/exercise and medication Relief from Meds: 6  Mobility walk without assistance  Function I need assistance with the following:  meal prep, household duties and shopping  Neuro/Psych No problems in this area  Prior Studies Any changes since last visit?  no  Physicians involved in your care Any changes since last visit?  no   Family History  Problem Relation Age of Onset  . Cancer Mother        multiple myeloma  . Diabetes Father   . Kidney disease Father   . Heart disease Father    Social History   Socioeconomic History  . Marital status: Married    Spouse name: Not on file  . Number of children: 2  . Years of education: some college  . Highest education level: Not on file  Occupational History  . Occupation: homemaker  Tobacco Use  . Smoking status: Never Smoker  . Smokeless tobacco: Never Used  Substance and Sexual Activity  . Alcohol use: No    Alcohol/week: 0.0  standard drinks  . Drug use: No  . Sexual activity: Not on file  Other Topics Concern  . Not on file  Social History Narrative   Lives at home with husband.   Right-handed.   No daily caffeine use.   Social Determinants of Health   Financial Resource Strain:   . Difficulty of Paying Living Expenses:   Food Insecurity:   . Worried About Charity fundraiser in the Last Year:   . Arboriculturist in the Last Year:   Transportation Needs:   . Film/video editor (Medical):   Marland Kitchen Lack of Transportation (Non-Medical):   Physical Activity:   . Days of Exercise per Week:   . Minutes of Exercise per Session:   Stress:   . Feeling of Stress :   Social Connections:   . Frequency of Communication with Friends and Family:   . Frequency of Social Gatherings with Friends and Family:   . Attends Religious Services:   . Active Member of Clubs or Organizations:   . Attends Archivist Meetings:   Marland Kitchen Marital Status:    Past Surgical History:  Procedure Laterality Date  . CERVICAL SPINE SURGERY     C6-7 fusion  . CESAREAN SECTION     x2  . dental implant  July 2015  . ENDOSCOPIC PLANTAR FASCIOTOMY Right August 2015  . GASTRIC BYPASS    .  HAND SURGERY Right   . LITHOTRIPSY     multiple  . PLANTAR FASCIECTOMY Right   . SHOULDER SURGERY Left 10/2004  . SMALL INTESTINE SURGERY     cervical laminectomy  . utereroscopy  1995   Past Medical History:  Diagnosis Date  . Anxiety   . Chronic kidney disease    stones  . Chronic pain   . Complex regional pain syndrome I    right foot  . Depression   . Diabetes mellitus   . Hypertension   . Kidney stone   . Neuromuscular disorder ([place])   . Tremor    BP 109/68   Pulse 94   Temp 98.2 F (36.8 C)   Ht '5\' 7"'$  (1.702 m)   Wt 287 lb (130.2 kg)   SpO2 95%   BMI 44.95 kg/m   Opioid Risk Score:   Fall Risk Score:  `1  Depression screen PHQ 2/9  Depression screen [facility] 2/9 07/10/2018 06/12/2018 02/17/2018 07/01/2017 03/11/2017  02/14/2017 12/03/2016  Decreased Interest 0 0 0 0 0 0 0  Down, Depressed, Hopeless 0 0 0 0 0 0 0  PHQ - 2 Score 0 0 0 0 0 0 0  Altered sleeping - - - - - - -  Tired, decreased energy - - - - - - -  Change in appetite - - - - - - -  Feeling bad or failure about yourself  - - - - - - -  Trouble concentrating - - - - - - -  Moving slowly or fidgety/restless - - - - - - -  Suicidal thoughts - - - - - - -  PHQ-9 Score - - - - - - -   Review of Systems     Objective:   Physical Exam Vitals and nursing note reviewed.  Constitutional:      Appearance: Normal appearance.  Cardiovascular:     Rate and Rhythm: Normal rate and regular rhythm.     Pulses: Normal pulses.     Heart sounds: Normal heart sounds.  Pulmonary:     Effort: Pulmonary effort is normal.     Breath sounds: Normal breath sounds.  Musculoskeletal:     Cervical back: Normal range of motion and neck supple.     Comments: Normal Muscle Bulk and Muscle Testing Reveals:  Upper Extremities: Full ROM and Muscle Strength 5/5 Thoracic Paraspinal Tenderness: T-7-T-9 Mainly Left Side Lumbar Paraspinal Tenderness: L-4-L-5 Lower Extremities: Full ROM and Muscle Strength 5/5 Arises from Table with ease Narrow Based Gait   Skin:    General: Skin is warm and dry.  Neurological:     Mental Status: She is alert and oriented to person, place, and time.  Psychiatric:        Mood and Affect: Mood normal.        Behavior: Behavior normal.           Assessment & Plan:  1.Complex regional pain syndrome right lower extremity postoperative. She's s/p post plantar fascial release. She continue withsensitivity totouch. She has diffuse numbness and tingling.ContinueGabapentin.09/07/2019. Refilled: Nucynta 100 mg one capsules every 6 hours as needed for pain. Second script sent for the following month to accommodate scheduled appointment.  We will continue the opioid monitoring program, this consists of regular clinic visits,  examinations, urine drug screen, pill counts as well as use of [place] Controlled Substance reporting System. 2.Myofascial pain syndrome:Continuecurrent medication regime withTizanidine and ice, heat and exercise regime.09/07/2019 3. Depression:  Continue: Zoloft.PCP Following.09/07/2019 4. Cervical Post Laminectomy: Continue to Monitor.09/07/2019 5.Cervicalgia/Cervical Radiculopathy/ Bilateral Hand Pain, Leftarm and lefthand with tingling and burning.Continuecurrent medication regime withGabapentin04/16/2021 6. Peripheral Neuropathy:ContinueGabapentin:Continue to Monitor.09/07/2019 7.Chronic BilateralShoulder Pain:OrthoFollowing.Continue current medication regime and Continue to Monitor.09/07/2019 8. Right hand pain/ Post Surgical Procedure:Trigger Finger Release: Dr. Jerilee Hoh Following. No complaints Today. 09/07/2019. 9. Tremor:No complaints today. Continue withGabapentin decreased to 600 mg five times a day. 09/07/2019 10. Bilateral Knee Pain: Continue with HEP as Tolerated. Continue to Monitor.  11. Left Foot Pain. Continue with HEP as tolerated. Continue current medication regimen. Continue to monitor.   F/U in 1 month  15 minutes of face to face patient care time was spent during this visit. All questions were encouraged and answered.

## 2019-10-02 IMAGING — MR MR CERVICAL SPINE W/O CM
5 series · 33 of 48 positions shown · non-contrast
Comparison: CT myelogram 02/02/2010.  MRI 11/19/2009

CLINICAL DATA: Cervical fusion 9113. Chronic neck pain and left
shoulder pain. Recent falls.

EXAM:
MRI CERVICAL SPINE WITHOUT CONTRAST
TECHNIQUE: Multiplanar, multisequence MR imaging of the cervical spine was
performed. No intravenous contrast was administered.

[Series 2: T2 · sagittal · 3.0mm · 0.56mm/px · 6 of 13 slices shown (1 of 2)]
[im 1/13]
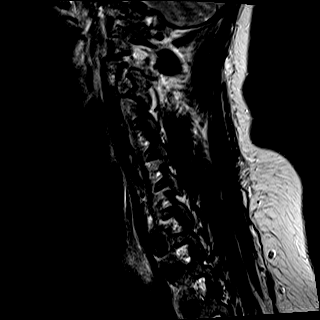
[im 3/13]
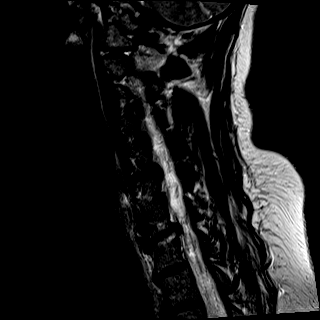
[im 5/13]
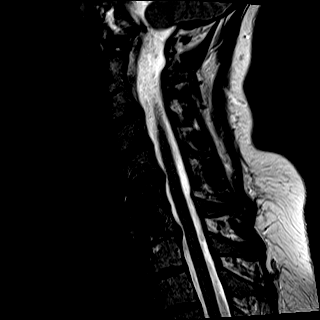
[im 8/13]
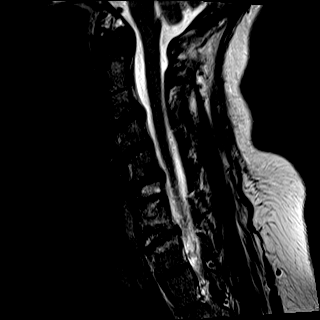
[im 10/13]
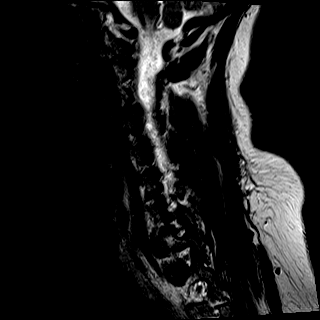
[im 13/13]
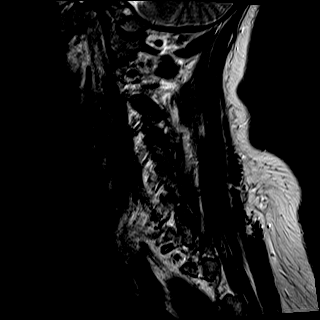

[Series 4: STIR · sagittal · 3.0mm · 0.35mm/px · 7 of 13 slices shown]
[im 1/13]
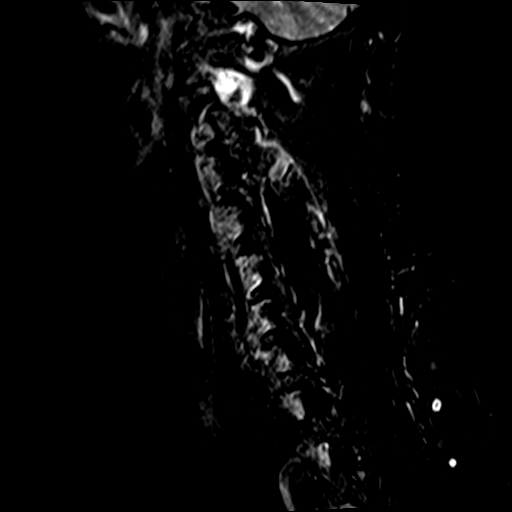
[im 3/13]
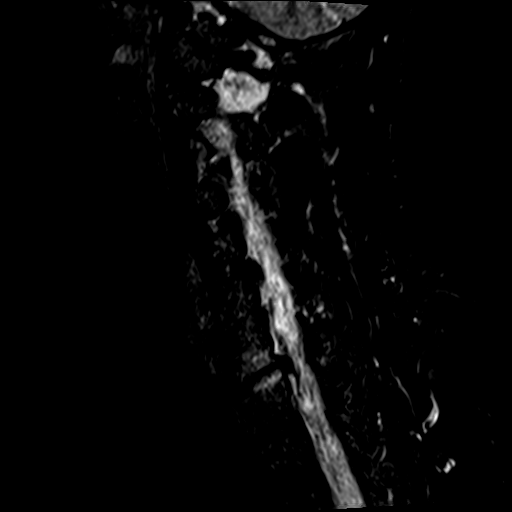
[im 5/13]
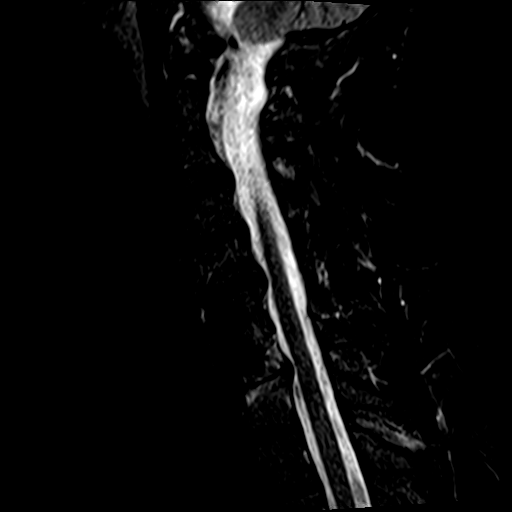
[im 7/13]
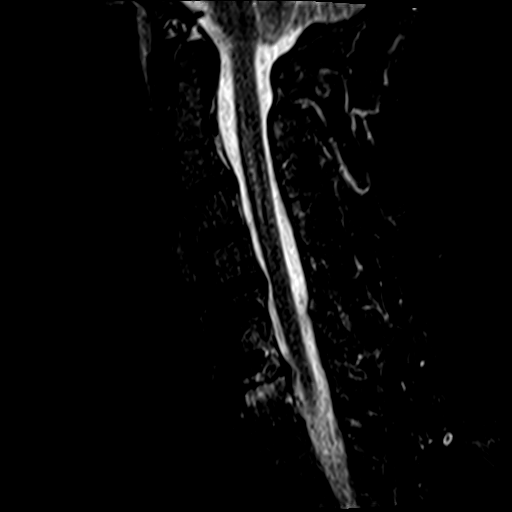
[im 9/13]
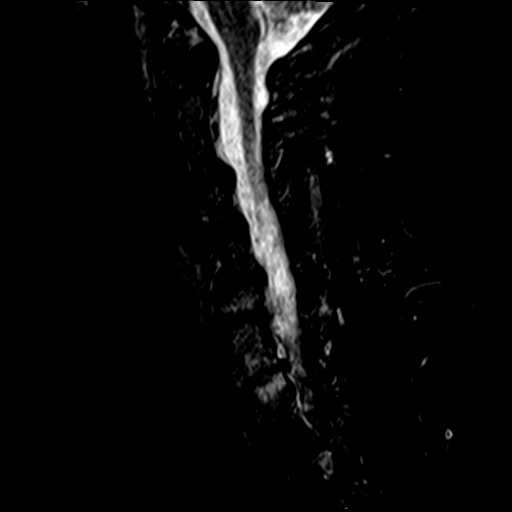
[im 11/13]
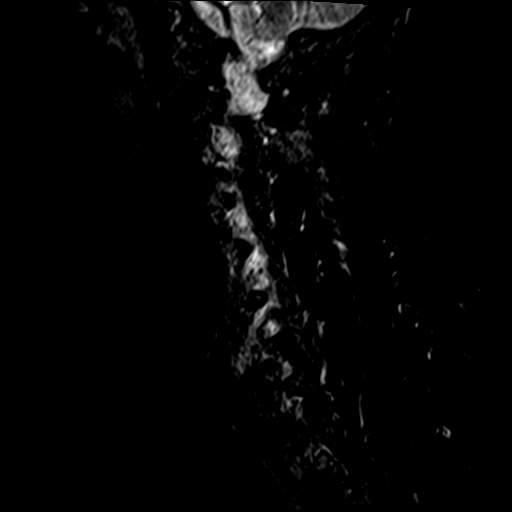
[im 13/13]
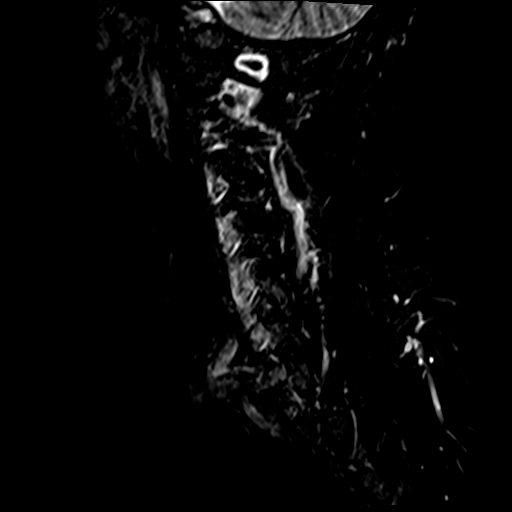

[Series 5: T2 · axial · 3.0mm · 0.62mm/px · z∈[-105,-5]mm · 8 of 28 slices shown (2 of 2)]
[im 1/28]
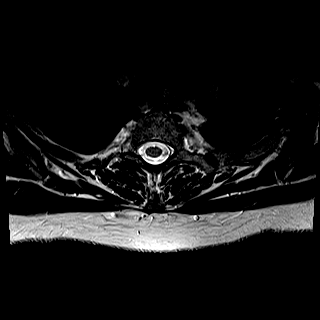
[im 5/28]
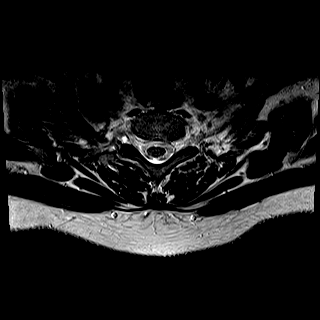
[im 9/28]
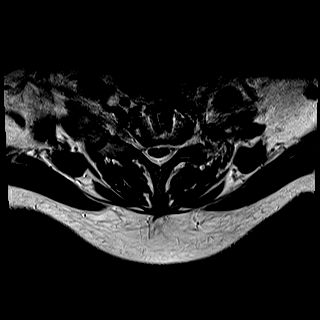
[im 13/28]
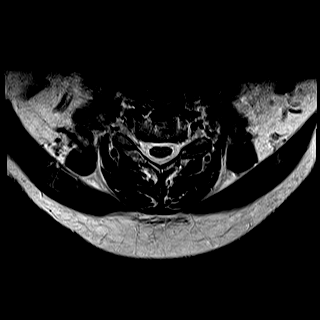
[im 15/28]
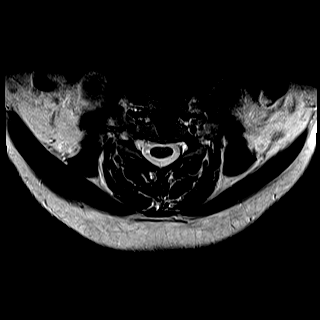
[im 19/28]
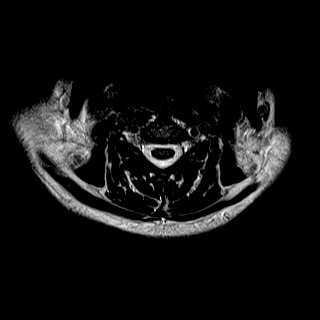
[im 23/28]
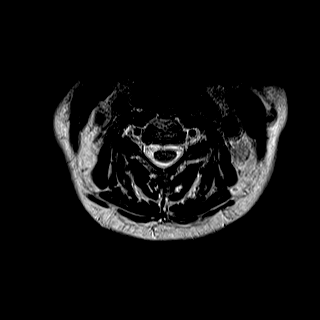
[im 28/28]
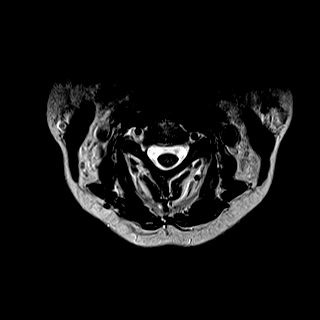

[Series 6: mpgr ax · axial · 3.0mm · 0.39mm/px · z∈[-97,-45]mm · 5 of 28 slices shown]
[im 1/28]
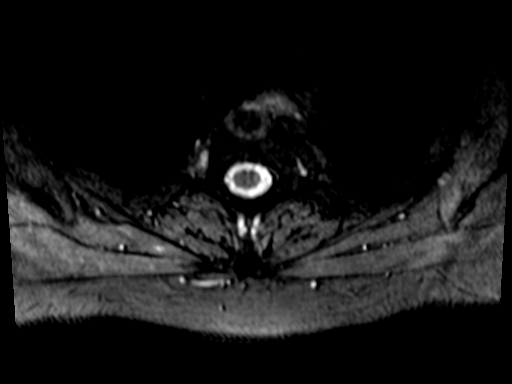
[im 5/28]
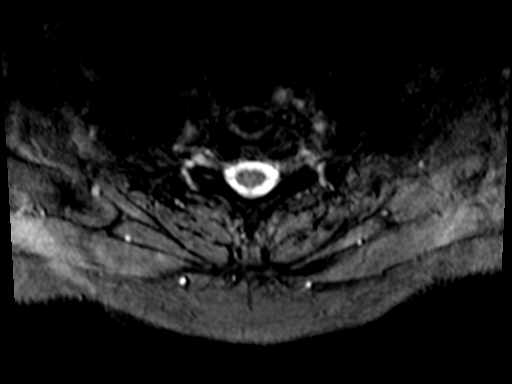
[im 9/28]
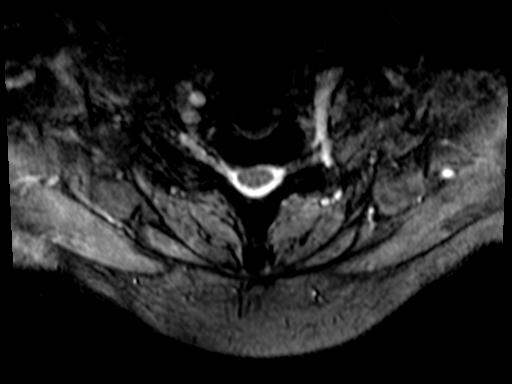
[im 13/28]
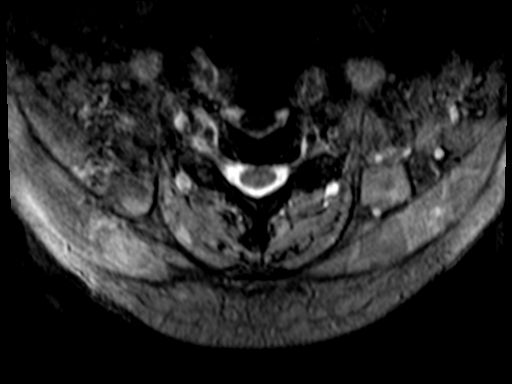
[im 15/28]
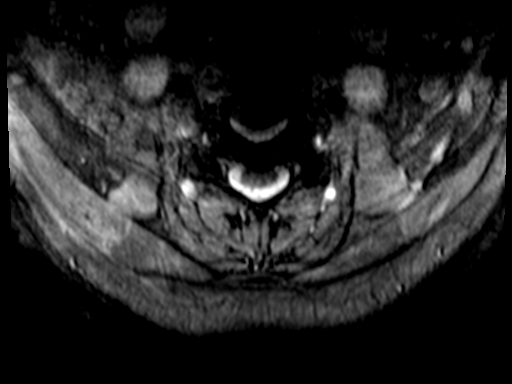

[Series 7: T1 · sagittal · 3.0mm · 0.70mm/px · 7 of 13 slices shown]
[im 1/13]
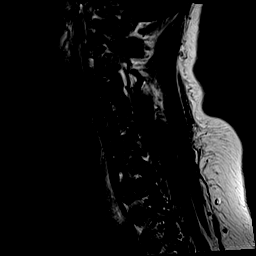
[im 3/13]
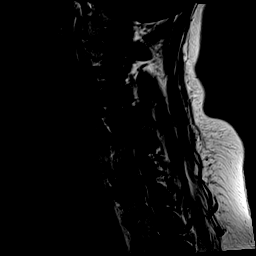
[im 5/13]
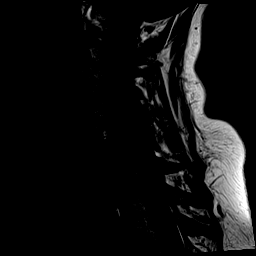
[im 7/13]
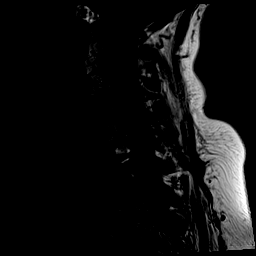
[im 9/13]
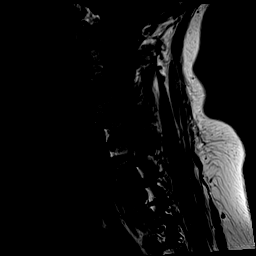
[im 11/13]
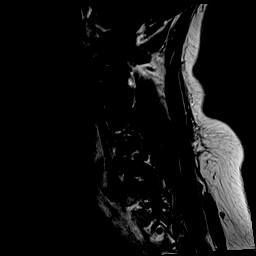
[im 13/13]
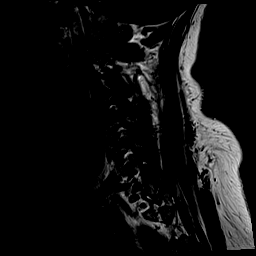

[33 of 48 positions shown; findings below may reference images not displayed]

FINDINGS: Alignment: Straightening of the normal cervical lordosis.

Vertebrae: Congenital failure of separation at C2-3. Previous ACDF
C6-7. Discogenic marrow edema at C7-T1.

Cord: No cord compression or primary cord lesion.

Posterior Fossa, vertebral arteries, paraspinal tissues: Negative

Disc levels:

No stenosis at the foramen magnum, C1-2 or C2-3.

C3-4: Disc bulge without central canal stenosis. Mild osteophytic
foraminal narrowing. Facet degeneration worse on the left. No likely
neural compression however.

C4-5:  Mild disc bulge.  No stenosis.

C5-6: Disc bulge and uncovertebral hypertrophy. No compressive
stenosis.

C6-7: Previous ACDF has a good appearance with wide patency of the
canal and foramina.

C7-T1: Worsened adjacent segment degenerative disease at this level.
Loss of disc height. Endplate edematous changes which could be
associated with neck pain. Endplate osteophytes and bulging of the
disc. Narrowing of the ventral subarachnoid space but no compression
of the cord. Mild bilateral foraminal encroachment.
IMPRESSION: Worsening adjacent segment degenerative disease at C7-T1. Loss of
disc height. Endplate edema which could be associated with neck
pain. Endplate osteophytes and bulging of the disc with mild
bilateral foraminal narrowing.

Chronic degenerative changes in the upper cervical region without
apparent change or likely compressive stenosis.

## 2019-10-16 ENCOUNTER — Encounter: Payer: 59 | Admitting: Registered Nurse

## 2019-10-25 ENCOUNTER — Other Ambulatory Visit: Payer: Self-pay

## 2019-10-25 ENCOUNTER — Encounter: Payer: Self-pay | Admitting: Registered Nurse

## 2019-10-25 ENCOUNTER — Encounter: Payer: 59 | Attending: Physical Medicine & Rehabilitation | Admitting: Registered Nurse

## 2019-10-25 VITALS — BP 114/83 | HR 103 | Temp 97.9°F | Ht 67.0 in | Wt 284.0 lb

## 2019-10-25 DIAGNOSIS — G8929 Other chronic pain: Secondary | ICD-10-CM

## 2019-10-25 DIAGNOSIS — M25512 Pain in left shoulder: Secondary | ICD-10-CM

## 2019-10-25 DIAGNOSIS — M797 Fibromyalgia: Secondary | ICD-10-CM | POA: Diagnosis not present

## 2019-10-25 DIAGNOSIS — R200 Anesthesia of skin: Secondary | ICD-10-CM

## 2019-10-25 DIAGNOSIS — M961 Postlaminectomy syndrome, not elsewhere classified: Secondary | ICD-10-CM | POA: Insufficient documentation

## 2019-10-25 DIAGNOSIS — M79641 Pain in right hand: Secondary | ICD-10-CM

## 2019-10-25 DIAGNOSIS — M7918 Myalgia, other site: Secondary | ICD-10-CM

## 2019-10-25 DIAGNOSIS — Z5181 Encounter for therapeutic drug level monitoring: Secondary | ICD-10-CM | POA: Insufficient documentation

## 2019-10-25 DIAGNOSIS — G894 Chronic pain syndrome: Secondary | ICD-10-CM | POA: Insufficient documentation

## 2019-10-25 DIAGNOSIS — G90521 Complex regional pain syndrome I of right lower limb: Secondary | ICD-10-CM | POA: Diagnosis not present

## 2019-10-25 DIAGNOSIS — M1711 Unilateral primary osteoarthritis, right knee: Secondary | ICD-10-CM

## 2019-10-25 DIAGNOSIS — Z79891 Long term (current) use of opiate analgesic: Secondary | ICD-10-CM | POA: Diagnosis present

## 2019-10-25 DIAGNOSIS — R202 Paresthesia of skin: Secondary | ICD-10-CM

## 2019-10-25 DIAGNOSIS — M542 Cervicalgia: Secondary | ICD-10-CM

## 2019-10-25 DIAGNOSIS — M5412 Radiculopathy, cervical region: Secondary | ICD-10-CM | POA: Diagnosis not present

## 2019-10-25 DIAGNOSIS — G629 Polyneuropathy, unspecified: Secondary | ICD-10-CM

## 2019-10-25 DIAGNOSIS — M1712 Unilateral primary osteoarthritis, left knee: Secondary | ICD-10-CM

## 2019-10-25 DIAGNOSIS — M545 Low back pain: Secondary | ICD-10-CM

## 2019-10-25 MED ORDER — NUCYNTA 100 MG PO TABS: 1.0000 | ORAL_TABLET | Freq: Four times a day (QID) | ORAL | 0 refills | Status: DC | PRN

## 2019-10-25 NOTE — Progress Notes (Signed)
Subjective:    Patient ID: Sabrina Williams, female    DOB: 02-21-1971, 49 y.o.   MRN: [id number]  HPI: Sabrina Williams is a 49 y.o. female who returns for follow up appointment for chronic pain and medication refill. She states her pain is located in her neck radiating into her left shoulder, left arm and left hand with tingling and buring. Also reports right hand pain, lower back pain, bilateral knees and right foot pain. She rates her pain 4. Her  current exercise regime is walking and riding her stationary bicycle for 30 minutes three days a week.  Sabrina Williams states her husband was diagnosed with Prostrate Cancer and is scheduled for surgery. Emotional support given,   Sabrina Williams Morphine equivalent is 160.00 MME.  Last UDS was Performed on 08/08/2019, it was consistent.    Pain Inventory Average Pain 4 Pain Right Now 4 My pain is constant  In the last 24 hours, has pain interfered with the following? General activity 6 Relation with others 3 Enjoyment of life 3 What TIME of day is your pain at its worst? evening Sleep (in general) Fair  Pain is worse with: walking, bending, standing and some activites Pain improves with: rest, heat/ice, therapy/exercise and medication Relief from Meds: 7  Mobility walk without assistance how many minutes can you walk? 45-60 ability to climb steps?  yes do you drive?  yes  Function I need assistance with the following:  meal prep, household duties and shopping  Neuro/Psych spasms depression anxiety  Prior Studies Any changes since last visit?  no  Physicians involved in your care Any changes since last visit?  no   Family History  Problem Relation Age of Onset  . Cancer Mother        multiple myeloma  . Diabetes Father   . Kidney disease Father   . Heart disease Father    Social History   Socioeconomic History  . Marital status: Married    Spouse name: Not on file  . Number of children: 2  . Years of education:  some college  . Highest education level: Not on file  Occupational History  . Occupation: homemaker  Tobacco Use  . Smoking status: Never Smoker  . Smokeless tobacco: Never Used  Substance and Sexual Activity  . Alcohol use: No    Alcohol/week: 0.0 standard drinks  . Drug use: No  . Sexual activity: Not on file  Other Topics Concern  . Not on file  Social History Narrative   Lives at home with husband.   Right-handed.   No daily caffeine use.   Social Determinants of Health   Financial Resource Strain:   . Difficulty of Paying Living Expenses:   Food Insecurity:   . Worried About Charity fundraiser in the Last Year:   . Arboriculturist in the Last Year:   Transportation Needs:   . Film/video editor (Medical):   Marland Kitchen Lack of Transportation (Non-Medical):   Physical Activity:   . Days of Exercise per Week:   . Minutes of Exercise per Session:   Stress:   . Feeling of Stress :   Social Connections:   . Frequency of Communication with Friends and Family:   . Frequency of Social Gatherings with Friends and Family:   . Attends Religious Services:   . Active Member of Clubs or Organizations:   . Attends Archivist Meetings:   Marland Kitchen Marital Status:  Past Surgical History:  Procedure Laterality Date  . CERVICAL SPINE SURGERY     C6-7 fusion  . CESAREAN SECTION     x2  . dental implant  July 2015  . ENDOSCOPIC PLANTAR FASCIOTOMY Right August 2015  . GASTRIC BYPASS    . HAND SURGERY Right   . LITHOTRIPSY     multiple  . PLANTAR FASCIECTOMY Right   . SHOULDER SURGERY Left 10/2004  . SMALL INTESTINE SURGERY     cervical laminectomy  . utereroscopy  1995   Past Medical History:  Diagnosis Date  . Anxiety   . Chronic kidney disease    stones  . Chronic pain   . Complex regional pain syndrome I    right foot  . Depression   . Diabetes mellitus   . Hypertension   . Kidney stone   . Neuromuscular disorder ([place])   . Tremor    BP 114/83   Pulse (!)  103   Temp 97.9 F (36.6 C)   Ht '5\' 7"'$  (1.702 m)   Wt 284 lb (128.8 kg)   SpO2 97%   BMI 44.48 kg/m   Opioid Risk Score:   Fall Risk Score:  `1  Depression screen PHQ 2/9  Depression screen [facility] 2/9 07/10/2018 06/12/2018 02/17/2018 07/01/2017 03/11/2017 02/14/2017 12/03/2016  Decreased Interest 0 0 0 0 0 0 0  Down, Depressed, Hopeless 0 0 0 0 0 0 0  PHQ - 2 Score 0 0 0 0 0 0 0  Altered sleeping - - - - - - -  Tired, decreased energy - - - - - - -  Change in appetite - - - - - - -  Feeling bad or failure about yourself  - - - - - - -  Trouble concentrating - - - - - - -  Moving slowly or fidgety/restless - - - - - - -  Suicidal thoughts - - - - - - -  PHQ-9 Score - - - - - - -   Review of Systems  Constitutional: Negative.   HENT: Negative.   Eyes: Negative.   Respiratory: Negative.   Cardiovascular: Negative.   Gastrointestinal: Negative.   Endocrine: Negative.   Musculoskeletal:       Spasms  Skin: Negative.   Allergic/Immunologic: Negative.   Neurological: Negative.   Hematological: Negative.   Psychiatric/Behavioral: Positive for dysphoric mood. The patient is nervous/anxious.   All other systems reviewed and are negative.      Objective:   Physical Exam Vitals and nursing note reviewed.  Constitutional:      Appearance: Normal appearance.  Cardiovascular:     Rate and Rhythm: Normal rate and regular rhythm.     Pulses: Normal pulses.     Heart sounds: Normal heart sounds.  Pulmonary:     Effort: Pulmonary effort is normal.     Breath sounds: Normal breath sounds.  Musculoskeletal:     Cervical back: Normal range of motion and neck supple.     Comments: Normal Muscle Bulk and Muscle Testing Reveals:  Upper Extremities: Full ROM and Muscle Strength 5/5 Left AC Joint Tenderness Lumbar Paraspinal Tenderness: L-4-L-5 Lower Extremities: Full ROM and Muscle Strength 5/5 Arises from Table with ease Narrow Based  Gait   Skin:    General: Skin is warm and dry.    Neurological:     Mental Status: She is alert and oriented to person, place, and time.  Psychiatric:  Mood and Affect: Mood normal.        Behavior: Behavior normal.           Assessment & Plan:  1.Complex regional pain syndrome right lower extremity postoperative. She's s/p post plantar fascial release. She continue withsensitivity totouch. She has diffuse numbness and tingling.ContinueGabapentin.10/25/2019. Refilled: Nucynta 100 mg one capsules every 6 hours as needed for pain.  We will continue the opioid monitoring program, this consists of regular clinic visits, examinations, urine drug screen, pill counts as well as use of [place] Controlled Substance reporting System. 2.Myofascial pain syndrome:Continuecurrent medication regime withTizanidine and ice, heat and exercise regime.10/25/2019 3. Depression: Continue: Zoloft.PCP Following.10/25/2019 4. Cervical Post Laminectomy: Continue to Monitor.10/25/2019 5.Cervicalgia/Cervical Radiculopathy/ Bilateral Hand Pain, Leftarm and lefthand with tingling and burning.Continuecurrent medication regime withGabapentin06/07/2019 6. Peripheral Neuropathy:ContinueGabapentin:Continue to Monitor.10/25/2019 7.Chronic BilateralShoulder Pain:OrthoFollowing.Continue current medication regime and Continue to Monitor.10/25/2019 8. Right hand pain/ Post Surgical Procedure:Trigger Finger Release: Dr. Jerilee Hoh Following. No complaints Today. 10/25/2019. 9. Tremor:No complaints today. Continue withGabapentin decreased to 600 mg five times a day. 10/25/2019 10. Bilateral Knee Pain: Continue with HEP as Tolerated. Continue to Monitor. 10/25/2019 11. Right Foot Pain. Continue with HEP as tolerated. Continue current medication regimen. Continue to monitor. 10/25/2019  F/U in 1 month  22mnutes of face to face patient care time was spent during this visit. All questions were encouraged and  answered.

## 2019-11-05 ENCOUNTER — Ambulatory Visit: Payer: 59 | Admitting: Registered Nurse

## 2019-11-06 ENCOUNTER — Ambulatory Visit: Payer: 59 | Admitting: Registered Nurse

## 2019-11-29 ENCOUNTER — Encounter: Payer: 59 | Admitting: Registered Nurse

## 2019-12-04 ENCOUNTER — Encounter: Payer: 59 | Attending: Physical Medicine & Rehabilitation | Admitting: Registered Nurse

## 2019-12-04 ENCOUNTER — Other Ambulatory Visit: Payer: Self-pay

## 2019-12-04 ENCOUNTER — Encounter: Payer: Self-pay | Admitting: Registered Nurse

## 2019-12-04 VITALS — BP 92/66 | HR 99 | Temp 98.1°F | Ht 67.0 in | Wt 280.0 lb

## 2019-12-04 DIAGNOSIS — M797 Fibromyalgia: Secondary | ICD-10-CM | POA: Diagnosis not present

## 2019-12-04 DIAGNOSIS — M961 Postlaminectomy syndrome, not elsewhere classified: Secondary | ICD-10-CM | POA: Diagnosis not present

## 2019-12-04 DIAGNOSIS — G90521 Complex regional pain syndrome I of right lower limb: Secondary | ICD-10-CM | POA: Insufficient documentation

## 2019-12-04 DIAGNOSIS — M25512 Pain in left shoulder: Secondary | ICD-10-CM

## 2019-12-04 DIAGNOSIS — M5412 Radiculopathy, cervical region: Secondary | ICD-10-CM | POA: Insufficient documentation

## 2019-12-04 DIAGNOSIS — M542 Cervicalgia: Secondary | ICD-10-CM | POA: Diagnosis not present

## 2019-12-04 DIAGNOSIS — G8929 Other chronic pain: Secondary | ICD-10-CM

## 2019-12-04 DIAGNOSIS — Z79891 Long term (current) use of opiate analgesic: Secondary | ICD-10-CM | POA: Insufficient documentation

## 2019-12-04 DIAGNOSIS — R202 Paresthesia of skin: Secondary | ICD-10-CM

## 2019-12-04 DIAGNOSIS — G894 Chronic pain syndrome: Secondary | ICD-10-CM | POA: Insufficient documentation

## 2019-12-04 DIAGNOSIS — Z5181 Encounter for therapeutic drug level monitoring: Secondary | ICD-10-CM | POA: Insufficient documentation

## 2019-12-04 DIAGNOSIS — M1712 Unilateral primary osteoarthritis, left knee: Secondary | ICD-10-CM

## 2019-12-04 DIAGNOSIS — R200 Anesthesia of skin: Secondary | ICD-10-CM

## 2019-12-04 DIAGNOSIS — M1711 Unilateral primary osteoarthritis, right knee: Secondary | ICD-10-CM

## 2019-12-04 DIAGNOSIS — G629 Polyneuropathy, unspecified: Secondary | ICD-10-CM

## 2019-12-04 MED ORDER — GABAPENTIN 600 MG PO TABS: 600.0000 mg | ORAL_TABLET | Freq: Every day | ORAL | 3 refills | Status: DC

## 2019-12-04 MED ORDER — NUCYNTA 100 MG PO TABS: 1.0000 | ORAL_TABLET | Freq: Four times a day (QID) | ORAL | 0 refills | Status: DC | PRN

## 2019-12-04 NOTE — Progress Notes (Signed)
Subjective:    Patient ID: Sabrina Williams, female    DOB: [birth year], 49 y.o.   MRN: [id number]  HPI: Sabrina Williams is a 49 y.o. female who returns for follow up appointment for chronic pain and medication refill. She states her pain is located in her neck radiating into her left shoulder, left arm and left hand with tingling and burning, also reports right hand pain with tingling, lower back pain and right foot pain with tingling and burning. She rates her pain 3. Her current exercise regime is walking and performing stretching exercises.Sabrina Williams reports she will began using her Nordic Trek three days a week.   Sabrina Williams Morphine equivalent is 160.00  MME.  Last UDS was Performed on 08/08/2019, it was consistent.    Pain Inventory Average Pain 3 Pain Right Now 3 My pain is constant, sharp, burning, dull, tingling and aching  In the last 24 hours, has pain interfered with the following? General activity 4 Relation with others 3 Enjoyment of life 3 What TIME of day is your pain at its worst? evening Sleep (in general) Fair  Pain is worse with: walking, bending, standing and some activites Pain improves with: rest, heat/ice, therapy/exercise and medication Relief from Meds: 7  Mobility walk without assistance how many minutes can you walk? 60 mins ability to climb steps?  yes do you drive?  yes Do you have any goals in this area?  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 I need assistance with the following:  household duties and shopping Do you have any goals in this area?  no  Neuro/Psych No problems in this area  Prior Studies Any changes since last visit?  no  Physicians involved in your care Any changes since last visit?  no   Family History  Problem Relation Age of Onset  . Cancer Mother        multiple myeloma  . Diabetes Father   . Kidney disease Father   . Heart disease Father    Social History   Socioeconomic History  . Marital  status: Married    Spouse name: Not on file  . Number of children: 2  . Years of education: some college  . Highest education level: Not on file  Occupational History  . Occupation: homemaker  Tobacco Use  . Smoking status: Never Smoker  . Smokeless tobacco: Never Used  Substance and Sexual Activity  . Alcohol use: No    Alcohol/week: 0.0 standard drinks  . Drug use: No  . Sexual activity: Not on file  Other Topics Concern  . Not on file  Social History Narrative   Lives at home with husband.   Right-handed.   No daily caffeine use.   Social Determinants of Health   Financial Resource Strain:   . Difficulty of Paying Living Expenses:   Food Insecurity:   . Worried About Charity fundraiser in the Last Year:   . Arboriculturist in the Last Year:   Transportation Needs:   . Film/video editor (Medical):   Marland Kitchen Lack of Transportation (Non-Medical):   Physical Activity:   . Days of Exercise per Week:   . Minutes of Exercise per Session:   Stress:   . Feeling of Stress :   Social Connections:   . Frequency of Communication with Friends and Family:   . Frequency of Social Gatherings with Friends and Family:   . Attends Religious Services:   .  Active Member of Clubs or Organizations:   . Attends Archivist Meetings:   Marland Kitchen Marital Status:    Past Surgical History:  Procedure Laterality Date  . CERVICAL SPINE SURGERY     C6-7 fusion  . CESAREAN SECTION     x2  . dental implant  July 2015  . ENDOSCOPIC PLANTAR FASCIOTOMY Right August 2015  . GASTRIC BYPASS    . HAND SURGERY Right   . LITHOTRIPSY     multiple  . PLANTAR FASCIECTOMY Right   . SHOULDER SURGERY Left 10/2004  . SMALL INTESTINE SURGERY     cervical laminectomy  . utereroscopy  1995   Past Medical History:  Diagnosis Date  . Anxiety   . Chronic kidney disease    stones  . Chronic pain   . Complex regional pain syndrome I    right foot  . Depression   . Diabetes mellitus   .  Hypertension   . Kidney stone   . Neuromuscular disorder ([place])   . Tremor    BP 92/66   Pulse 99   Temp 98.1 F (36.7 C)   Ht '5\' 7"'$  (1.702 m)   Wt 280 lb (127 kg)   LMP 12/02/2019   SpO2 96%   BMI 43.85 kg/m   Opioid Risk Score:   Fall Risk Score:  `1  Depression screen PHQ 2/9  Depression screen [facility] 2/9 10/25/2019 07/10/2018 06/12/2018 02/17/2018 07/01/2017 03/11/2017 02/14/2017  Decreased Interest 0 0 0 0 0 0 0  Down, Depressed, Hopeless 0 0 0 0 0 0 0  PHQ - 2 Score 0 0 0 0 0 0 0  Altered sleeping - - - - - - -  Tired, decreased energy - - - - - - -  Change in appetite - - - - - - -  Feeling bad or failure about yourself  - - - - - - -  Trouble concentrating - - - - - - -  Moving slowly or fidgety/restless - - - - - - -  Suicidal thoughts - - - - - - -  PHQ-9 Score - - - - - - -   Review of Systems  Constitutional: Negative.   HENT: Negative.   Eyes: Negative.   Respiratory: Negative.   Cardiovascular: Negative.   Gastrointestinal: Negative.   Endocrine: Negative.   Genitourinary: Negative.   Musculoskeletal:       Aching  Skin: Negative.   Allergic/Immunologic: Negative.   Neurological:       Tingling, burning  Hematological: Negative.   Psychiatric/Behavioral: Negative.        Objective:   Physical Exam Vitals and nursing note reviewed.  Constitutional:      Appearance: Normal appearance. She is obese.  Cardiovascular:     Rate and Rhythm: Normal rate and regular rhythm.     Pulses: Normal pulses.     Heart sounds: Normal heart sounds.  Pulmonary:     Effort: Pulmonary effort is normal.     Breath sounds: Normal breath sounds.  Musculoskeletal:     Cervical back: Normal range of motion and neck supple.     Comments: Normal Muscle Bulk and Muscle Testing Reveals:  Upper Extremities: Full ROM and Muscle Strength 5/5 Left AC Joint Tenderness Lumbar Paraspinal Tenderness: L-4-L-5 Lower Extremities: Full ROM and Muscle Strength 5/5 Arises from Table  with ease Narrow Based  Gait   Skin:    General: Skin is warm and dry.  Neurological:  Mental Status: She is alert and oriented to person, place, and time.  Psychiatric:        Mood and Affect: Mood normal.        Behavior: Behavior normal.           Assessment & Plan:  1.Complex regional pain syndrome right lower extremity postoperative. She's s/p post plantar fascial release. She continue withsensitivity totouch. She has diffuse numbness and tingling.ContinueGabapentin.12/04/2019. Refilled: Nucynta 100 mg one capsules every 6 hours as needed for pain. We will continue the opioid monitoring program, this consists of regular clinic visits, examinations, urine drug screen, pill counts as well as use of [place] Controlled Substance reporting System. 2.Myofascial pain syndrome:Continuecurrent medication regime withTizanidine and ice, heat and exercise regime.12/04/2019 3. Depression: Continue: Zoloft.PCP Following.12/04/2019 4. Cervical Post Laminectomy: Continue to Monitor.12/04/2019 5.Cervicalgia/Cervical Radiculopathy/ Bilateral Hand Pain with tingling, Leftarm and lefthand with tingling and burning.Continuecurrent medication regime withGabapentin07/13/2021 6. Peripheral Neuropathy:ContinueGabapentin:Continue to Monitor.12/04/2019 7.ChronicBilateralShoulder Pain:OrthoFollowing.Continue current medication regime and Continue to Monitor.12/04/2019 8. Right hand pain/ Post Surgical Procedure:Trigger Finger Release: Dr. Jerilee Hoh Following. No complaints Today.12/04/2019. 9. Tremor:No complaints today. Continue withGabapentin decreased to 600 mg five times a day. 12/04/2019 10. Bilateral Knee Pain: Continue with HEP as Tolerated. Continue to Monitor. 10/25/2019 11. Right Foot Pain. Continue with HEP as tolerated. Continue current medication regimen. Continue to monitor.12/04/2019 12. Polyneuropathy: Continue Gabapentin. Continue to  Monitor.  F/U in 1 month  28mnutes of face to face patient care time was spent during this visit. All questions were encouraged and answered.

## 2020-01-04 ENCOUNTER — Other Ambulatory Visit: Payer: Self-pay

## 2020-01-04 ENCOUNTER — Encounter: Payer: 59 | Attending: Physical Medicine & Rehabilitation | Admitting: Registered Nurse

## 2020-01-04 VITALS — BP 110/79 | HR 86 | Temp 98.0°F | Ht 67.0 in | Wt 285.6 lb

## 2020-01-04 DIAGNOSIS — M961 Postlaminectomy syndrome, not elsewhere classified: Secondary | ICD-10-CM | POA: Diagnosis not present

## 2020-01-04 DIAGNOSIS — Z5181 Encounter for therapeutic drug level monitoring: Secondary | ICD-10-CM | POA: Diagnosis present

## 2020-01-04 DIAGNOSIS — R202 Paresthesia of skin: Secondary | ICD-10-CM

## 2020-01-04 DIAGNOSIS — M5412 Radiculopathy, cervical region: Secondary | ICD-10-CM | POA: Diagnosis not present

## 2020-01-04 DIAGNOSIS — R200 Anesthesia of skin: Secondary | ICD-10-CM

## 2020-01-04 DIAGNOSIS — M1711 Unilateral primary osteoarthritis, right knee: Secondary | ICD-10-CM

## 2020-01-04 DIAGNOSIS — G8929 Other chronic pain: Secondary | ICD-10-CM

## 2020-01-04 DIAGNOSIS — M79671 Pain in right foot: Secondary | ICD-10-CM

## 2020-01-04 DIAGNOSIS — M797 Fibromyalgia: Secondary | ICD-10-CM | POA: Diagnosis not present

## 2020-01-04 DIAGNOSIS — Z79891 Long term (current) use of opiate analgesic: Secondary | ICD-10-CM | POA: Diagnosis present

## 2020-01-04 DIAGNOSIS — G90521 Complex regional pain syndrome I of right lower limb: Secondary | ICD-10-CM | POA: Diagnosis not present

## 2020-01-04 DIAGNOSIS — G629 Polyneuropathy, unspecified: Secondary | ICD-10-CM

## 2020-01-04 DIAGNOSIS — G894 Chronic pain syndrome: Secondary | ICD-10-CM | POA: Diagnosis not present

## 2020-01-04 DIAGNOSIS — M25512 Pain in left shoulder: Secondary | ICD-10-CM

## 2020-01-04 DIAGNOSIS — M79672 Pain in left foot: Secondary | ICD-10-CM

## 2020-01-04 MED ORDER — NUCYNTA 100 MG PO TABS: 1.0000 | ORAL_TABLET | Freq: Four times a day (QID) | ORAL | 0 refills | Status: DC | PRN

## 2020-01-04 MED ORDER — TIZANIDINE HCL 4 MG PO TABS: 4.0000 mg | ORAL_TABLET | Freq: Every day | ORAL | 5 refills | Status: DC | PRN

## 2020-01-04 NOTE — Progress Notes (Signed)
Subjective:    Patient ID: Sabrina Williams, female    DOB: 13-Feb-1971, 49 y.o.   MRN: [id number]  HPI: Sabrina Williams is a 49 y.o. female who returns for follow up appointment for chronic pain and medication refill. She states her pain is located in her left shoulder radiating into left arm with numbness and tingling. Also reports right knee pain and  bilateral feet pain with tingling and burning L>R. She  rates her pain 7. Her current exercise regime is walking and performing stretching exercises.  Sabrina Williams Morphine equivalent is 160.00 MME.  UDS ordered for today.   Pain Inventory Average Pain 5 Pain Right Now 7 My pain is constant, sharp, burning, dull, stabbing, tingling and aching  In the last 24 hours, has pain interfered with the following? General activity 7 Relation with others 6 Enjoyment of life 7 What TIME of day is your pain at its worst? evening Sleep (in general) Fair  Pain is worse with: walking, standing and some activites Pain improves with: rest, heat/ice, therapy/exercise and medication Relief from Meds: 5  Family History  Problem Relation Age of Onset  . Cancer Mother        multiple myeloma  . Diabetes Father   . Kidney disease Father   . Heart disease Father    Social History   Socioeconomic History  . Marital status: Married    Spouse name: Not on file  . Number of children: 2  . Years of education: some college  . Highest education level: Not on file  Occupational History  . Occupation: homemaker  Tobacco Use  . Smoking status: Never Smoker  . Smokeless tobacco: Never Used  Substance and Sexual Activity  . Alcohol use: No    Alcohol/week: 0.0 standard drinks  . Drug use: No  . Sexual activity: Not on file  Other Topics Concern  . Not on file  Social History Narrative   Lives at home with husband.   Right-handed.   No daily caffeine use.   Social Determinants of Health   Financial Resource Strain:   . Difficulty of Paying  Living Expenses:   Food Insecurity:   . Worried About Charity fundraiser in the Last Year:   . Arboriculturist in the Last Year:   Transportation Needs:   . Film/video editor (Medical):   Marland Kitchen Lack of Transportation (Non-Medical):   Physical Activity:   . Days of Exercise per Week:   . Minutes of Exercise per Session:   Stress:   . Feeling of Stress :   Social Connections:   . Frequency of Communication with Friends and Family:   . Frequency of Social Gatherings with Friends and Family:   . Attends Religious Services:   . Active Member of Clubs or Organizations:   . Attends Archivist Meetings:   Marland Kitchen Marital Status:    Past Surgical History:  Procedure Laterality Date  . CERVICAL SPINE SURGERY     C6-7 fusion  . CESAREAN SECTION     x2  . dental implant  July 2015  . ENDOSCOPIC PLANTAR FASCIOTOMY Right August 2015  . GASTRIC BYPASS    . HAND SURGERY Right   . LITHOTRIPSY     multiple  . PLANTAR FASCIECTOMY Right   . SHOULDER SURGERY Left 10/2004  . SMALL INTESTINE SURGERY     cervical laminectomy  . utereroscopy  1995   Past Surgical History:  Procedure Laterality Date  .  CERVICAL SPINE SURGERY     C6-7 fusion  . CESAREAN SECTION     x2  . dental implant  July 2015  . ENDOSCOPIC PLANTAR FASCIOTOMY Right August 2015  . GASTRIC BYPASS    . HAND SURGERY Right   . LITHOTRIPSY     multiple  . PLANTAR FASCIECTOMY Right   . SHOULDER SURGERY Left 10/2004  . SMALL INTESTINE SURGERY     cervical laminectomy  . utereroscopy  1995   Past Medical History:  Diagnosis Date  . Anxiety   . Chronic kidney disease    stones  . Chronic pain   . Complex regional pain syndrome I    right foot  . Depression   . Diabetes mellitus   . Hypertension   . Kidney stone   . Neuromuscular disorder ([place])   . Tremor    BP 110/79   Pulse 86   Temp 98 F (36.7 C)   Ht '5\' 7"'$  (1.702 m)   Wt 285 lb 9.6 oz (129.5 kg)   SpO2 96%   BMI 44.73 kg/m   Opioid Risk Score:    Fall Risk Score:  `1  Depression screen PHQ 2/9  Depression screen [facility] 2/9 01/04/2020 12/04/2019 10/25/2019 07/10/2018 06/12/2018 02/17/2018 07/01/2017  Decreased Interest 0 0 0 0 0 0 0  Down, Depressed, Hopeless 0 0 0 0 0 0 0  PHQ - 2 Score 0 0 0 0 0 0 0  Altered sleeping - - - - - - -  Tired, decreased energy - - - - - - -  Change in appetite - - - - - - -  Feeling bad or failure about yourself  - - - - - - -  Trouble concentrating - - - - - - -  Moving slowly or fidgety/restless - - - - - - -  Suicidal thoughts - - - - - - -  PHQ-9 Score - - - - - - -    Review of Systems  Musculoskeletal: Positive for gait problem.  Neurological:       Tingling  All other systems reviewed and are negative.      Objective:   Physical Exam Vitals and nursing note reviewed.  Constitutional:      Appearance: Normal appearance.  Cardiovascular:     Rate and Rhythm: Normal rate and regular rhythm.     Pulses: Normal pulses.     Heart sounds: Normal heart sounds.  Pulmonary:     Effort: Pulmonary effort is normal.     Breath sounds: Normal breath sounds.  Musculoskeletal:     Cervical back: Normal range of motion and neck supple.     Comments: Normal Muscle Bulk and Muscle Testing Reveals: Upper Extremities: Full ROM and Muscle Strength 5/5  Lower Extremities: Full ROM and Muscle Strength 5/5 Right Lower Extremity Flexion Produces Pain into her Right Patella Arises from Table with ease Narrow Based  Gait   Skin:    General: Skin is warm and dry.  Neurological:     Mental Status: She is alert and oriented to person, place, and time.  Psychiatric:        Mood and Affect: Mood normal.        Behavior: Behavior normal.           Assessment & Plan:  1.Complex regional pain syndrome right lower extremity postoperative. She's s/p post plantar fascial release. She continue withsensitivity totouch. She has diffuse numbness and tingling.ContinueGabapentin.01/04/2020. Refilled: Nucynta  100 mg one capsules every 6 hours as needed for pain. We will continue the opioid monitoring program, this consists of regular clinic visits, examinations, urine drug screen, pill counts as well as use of [place] Controlled Substance reporting System. 2.Myofascial pain syndrome:Continuecurrent medication regime withTizanidine and ice, heat and exercise regime.01/04/2020 3. Depression: Continue: Zoloft.PCP Following.01/04/2020 4. Cervical Post Laminectomy: Continue to Monitor.01/04/2020 5.Cervicalgia/Cervical Radiculopathy/ Bilateral Hand Pain with tingling, Leftarm and lefthand with tingling and burning.Continuecurrent medication regime withGabapentin08/13/2021 6. Poly Neuropathy:ContinueGabapentin:Continue to Monitor.01/04/2020 7.ChronicLeftShoulder Pain:OrthoFollowing.Continue current medication regime and Continue to Monitor.01/04/2020 8. Right hand pain/ Post Surgical Procedure:Trigger Finger Release: Dr. Jerilee Hoh Following. No complaints Today.01/04/2020. 9. Tremor:No complaints today. Continue withGabapentin decreased to 600 mg five times a day. 01/04/2020 10. Right Knee Pain: Continue with HEP as Tolerated. Continue to Monitor.01/04/2020 11.Bilateral Feet Pain L>R. Continue with HEP as tolerated. Continue current medication regimen. Continue to monitor.12/04/2019 12. Polyneuropathy: Continue Gabapentin. Continue to Monitor.  F/U in 1 month  38mnutes of face to face patient care time was spent during this visit. All questions were encouraged and answered.

## 2020-01-08 ENCOUNTER — Encounter: Payer: Self-pay | Admitting: Registered Nurse

## 2020-01-08 LAB — TOXASSURE SELECT,+ANTIDEPR,UR

## 2020-01-09 ENCOUNTER — Telehealth: Payer: Self-pay | Admitting: *Deleted

## 2020-01-09 NOTE — Telephone Encounter (Signed)
Urine drug screen for this encounter is consistent for prescribed medication 

## 2020-01-25 ENCOUNTER — Encounter: Payer: 59 | Attending: Physical Medicine & Rehabilitation | Admitting: Registered Nurse

## 2020-01-25 ENCOUNTER — Other Ambulatory Visit: Payer: Self-pay

## 2020-01-25 VITALS — BP 105/72 | HR 92 | Temp 98.3°F | Ht 67.0 in | Wt 286.8 lb

## 2020-01-25 DIAGNOSIS — R202 Paresthesia of skin: Secondary | ICD-10-CM

## 2020-01-25 DIAGNOSIS — G894 Chronic pain syndrome: Secondary | ICD-10-CM

## 2020-01-25 DIAGNOSIS — M797 Fibromyalgia: Secondary | ICD-10-CM | POA: Diagnosis not present

## 2020-01-25 DIAGNOSIS — M542 Cervicalgia: Secondary | ICD-10-CM

## 2020-01-25 DIAGNOSIS — Z79891 Long term (current) use of opiate analgesic: Secondary | ICD-10-CM

## 2020-01-25 DIAGNOSIS — R200 Anesthesia of skin: Secondary | ICD-10-CM | POA: Diagnosis not present

## 2020-01-25 DIAGNOSIS — M25512 Pain in left shoulder: Secondary | ICD-10-CM

## 2020-01-25 DIAGNOSIS — M5412 Radiculopathy, cervical region: Secondary | ICD-10-CM | POA: Diagnosis not present

## 2020-01-25 DIAGNOSIS — G629 Polyneuropathy, unspecified: Secondary | ICD-10-CM

## 2020-01-25 DIAGNOSIS — M961 Postlaminectomy syndrome, not elsewhere classified: Secondary | ICD-10-CM

## 2020-01-25 DIAGNOSIS — G90521 Complex regional pain syndrome I of right lower limb: Secondary | ICD-10-CM | POA: Diagnosis not present

## 2020-01-25 DIAGNOSIS — Z5181 Encounter for therapeutic drug level monitoring: Secondary | ICD-10-CM | POA: Diagnosis present

## 2020-01-25 DIAGNOSIS — G8929 Other chronic pain: Secondary | ICD-10-CM

## 2020-01-25 MED ORDER — NUCYNTA 100 MG PO TABS
1.0000 | ORAL_TABLET | Freq: Four times a day (QID) | ORAL | 0 refills | Status: DC | PRN
Start: 2020-01-25 — End: 2020-02-28

## 2020-01-25 NOTE — Progress Notes (Signed)
Subjective:    Patient ID: Sabrina Williams, female    DOB: 08/03/1970, 49 y.o.   MRN: [id number]  HPI: Sabrina Williams is a 49 y.o. female who returns for follow up appointment for chronic pain and medication refill. She states her pain is located in her neck radiating into her left shoulder, left arm and left hand with numbness and tingling, also reports right hand with tingling and numbness. She also reports lower back pain mainly left side and bilateral feet pain with tingling and burning L>R. She rates her pain 4. Her current exercise regime is walking and using her bicycle peddler QOD for 30 minutes.  Ms. Sandeen Morphine equivalent is 160.00 MME.  Last UDS was on 01/04/2020, it was consistent.   Pain Inventory Average Pain 5 Pain Right Now 4 My pain is constant, sharp, burning, dull, stabbing, tingling and aching  In the last 24 hours, has pain interfered with the following? General activity 5 Relation with others 3 Enjoyment of life 5 What TIME of day is your pain at its worst? evening Sleep (in general) Fair  Pain is worse with: walking, standing and some activites Pain improves with: rest, heat/ice, therapy/exercise and medication Relief from Meds: 6  Family History  Problem Relation Age of Onset  . Cancer Mother        multiple myeloma  . Diabetes Father   . Kidney disease Father   . Heart disease Father    Social History   Socioeconomic History  . Marital status: Married    Spouse name: Not on file  . Number of children: 2  . Years of education: some college  . Highest education level: Not on file  Occupational History  . Occupation: homemaker  Tobacco Use  . Smoking status: Never Smoker  . Smokeless tobacco: Never Used  Substance and Sexual Activity  . Alcohol use: No    Alcohol/week: 0.0 standard drinks  . Drug use: No  . Sexual activity: Not on file  Other Topics Concern  . Not on file  Social History Narrative   Lives at home with husband.    Right-handed.   No daily caffeine use.   Social Determinants of Health   Financial Resource Strain:   . Difficulty of Paying Living Expenses: Not on file  Food Insecurity:   . Worried About Charity fundraiser in the Last Year: Not on file  . Ran Out of Food in the Last Year: Not on file  Transportation Needs:   . Lack of Transportation (Medical): Not on file  . Lack of Transportation (Non-Medical): Not on file  Physical Activity:   . Days of Exercise per Week: Not on file  . Minutes of Exercise per Session: Not on file  Stress:   . Feeling of Stress : Not on file  Social Connections:   . Frequency of Communication with Friends and Family: Not on file  . Frequency of Social Gatherings with Friends and Family: Not on file  . Attends Religious Services: Not on file  . Active Member of Clubs or Organizations: Not on file  . Attends Archivist Meetings: Not on file  . Marital Status: Not on file   Past Surgical History:  Procedure Laterality Date  . CERVICAL SPINE SURGERY     C6-7 fusion  . CESAREAN SECTION     x2  . dental implant  July 2015  . ENDOSCOPIC PLANTAR FASCIOTOMY Right August 2015  . GASTRIC BYPASS    .  HAND SURGERY Right   . LITHOTRIPSY     multiple  . PLANTAR FASCIECTOMY Right   . SHOULDER SURGERY Left 10/2004  . SMALL INTESTINE SURGERY     cervical laminectomy  . utereroscopy  1995   Past Surgical History:  Procedure Laterality Date  . CERVICAL SPINE SURGERY     C6-7 fusion  . CESAREAN SECTION     x2  . dental implant  July 2015  . ENDOSCOPIC PLANTAR FASCIOTOMY Right August 2015  . GASTRIC BYPASS    . HAND SURGERY Right   . LITHOTRIPSY     multiple  . PLANTAR FASCIECTOMY Right   . SHOULDER SURGERY Left 10/2004  . SMALL INTESTINE SURGERY     cervical laminectomy  . utereroscopy  1995   Past Medical History:  Diagnosis Date  . Anxiety   . Chronic kidney disease    stones  . Chronic pain   . Complex regional pain syndrome I     right foot  . Depression   . Diabetes mellitus   . Hypertension   . Kidney stone   . Neuromuscular disorder ([place])   . Tremor    BP 105/72   Pulse 92   Temp 98.3 F (36.8 C)   Ht $R'5\' 7"'LF$  (1.702 m)   Wt 286 lb 12.8 oz (130.1 kg)   SpO2 96%   BMI 44.92 kg/m   Opioid Risk Score:   Fall Risk Score:  `1  Depression screen PHQ 2/9  Depression screen [facility] 2/9 01/04/2020 12/04/2019 10/25/2019 07/10/2018 06/12/2018 02/17/2018 07/01/2017  Decreased Interest 0 0 0 0 0 0 0  Down, Depressed, Hopeless 0 0 0 0 0 0 0  PHQ - 2 Score 0 0 0 0 0 0 0  Altered sleeping - - - - - - -  Tired, decreased energy - - - - - - -  Change in appetite - - - - - - -  Feeling bad or failure about yourself  - - - - - - -  Trouble concentrating - - - - - - -  Moving slowly or fidgety/restless - - - - - - -  Suicidal thoughts - - - - - - -  PHQ-9 Score - - - - - - -    Review of Systems  Musculoskeletal: Positive for back pain.  All other systems reviewed and are negative.      Objective:   Physical Exam Vitals and nursing note reviewed.  Constitutional:      Appearance: Normal appearance. She is obese.  Cardiovascular:     Rate and Rhythm: Normal rate and regular rhythm.     Pulses: Normal pulses.     Heart sounds: Normal heart sounds.  Pulmonary:     Effort: Pulmonary effort is normal.     Breath sounds: Normal breath sounds.  Musculoskeletal:     Cervical back: Normal range of motion and neck supple.     Comments: Normal Muscle Bulk and Muscle Testing Reveals:  Upper Extremities: Full ROM and Muscle Strength 5/5 Left AC Joint Tenderness  Lower Extremities: Full ROM and Muscle Strength 5/5 Arises from Table with ease Narrow Based Gait   Skin:    General: Skin is warm and dry.  Neurological:     Mental Status: She is alert and oriented to person, place, and time.  Psychiatric:        Mood and Affect: Mood normal.        Behavior: Behavior normal.  Assessment & Plan:  1.Complex  regional pain syndrome right lower extremity postoperative. She's s/p post plantar fascial release. She continue withsensitivity totouch. She has diffuse numbness and tingling.ContinueGabapentin.01/25/2020. Refilled: Nucynta 100 mg one capsules every 6 hours as needed for pain. We will continue the opioid monitoring program, this consists of regular clinic visits, examinations, urine drug screen, pill counts as well as use of [place] Controlled Substance Reporting system. A 12 month History has been reviewed on the [place] Controlled Substance Reporting System on 01/25/2020. 2.Myofascial pain syndrome:Continuecurrent medication regime withTizanidine and ice, heat and exercise regime.01/25/2020 3. Depression: Continue: Zoloft.PCP Following.01/25/2020 4. Cervical Post Laminectomy: Continue to Monitor.01/25/2020 5.Cervicalgia/Cervical Radiculopathy/ Bilateral Hand Painwith tingling, Leftarm and lefthand with tingling and burning.Continuecurrent medication regime withGabapentin09/07/2019 6. Poly Neuropathy:ContinueGabapentin:Continue to Monitor.01/25/2020 7.ChronicLeftShoulder Pain:OrthoFollowing.Continue current medication regime and Continue to Monitor.01/25/2020 8. Right hand pain/ Post Surgical Procedure:Trigger Finger Release: Dr. Jerilee Hoh Following. No complaints Today.01/25/2020. 9. Tremor:No complaints today. Continue withGabapentin decreased to 600 mg five times a day. 01/25/2020 10. Right Knee Pain: No complaints today.Continue with HEP as Tolerated. Continue to Monitor.01/25/2020 11.Bilateral Feet Pain L>R. Continue with HEP as tolerated. Continue current medication regimen. Continue to monitor.01/25/2020 12. Polyneuropathy: Continue Gabapentin. Continue to Monitor.01/25/2020 F/U in 1 month  37minutes of face to face patient care time was spent during this visit. All questions were encouraged and answered.

## 2020-01-27 ENCOUNTER — Encounter: Payer: Self-pay | Admitting: Registered Nurse

## 2020-02-25 ENCOUNTER — Other Ambulatory Visit: Payer: Self-pay | Admitting: Gerontology

## 2020-02-25 DIAGNOSIS — Z1231 Encounter for screening mammogram for malignant neoplasm of breast: Secondary | ICD-10-CM

## 2020-02-28 ENCOUNTER — Other Ambulatory Visit: Payer: Self-pay

## 2020-02-28 ENCOUNTER — Encounter: Payer: 59 | Attending: Physical Medicine & Rehabilitation | Admitting: Registered Nurse

## 2020-02-28 ENCOUNTER — Encounter: Payer: Self-pay | Admitting: Registered Nurse

## 2020-02-28 VITALS — BP 115/79 | HR 74 | Temp 97.9°F | Ht 67.0 in | Wt 284.0 lb

## 2020-02-28 DIAGNOSIS — G894 Chronic pain syndrome: Secondary | ICD-10-CM | POA: Insufficient documentation

## 2020-02-28 DIAGNOSIS — M25512 Pain in left shoulder: Secondary | ICD-10-CM | POA: Insufficient documentation

## 2020-02-28 DIAGNOSIS — G90521 Complex regional pain syndrome I of right lower limb: Secondary | ICD-10-CM | POA: Insufficient documentation

## 2020-02-28 DIAGNOSIS — M961 Postlaminectomy syndrome, not elsewhere classified: Secondary | ICD-10-CM | POA: Diagnosis not present

## 2020-02-28 DIAGNOSIS — M5412 Radiculopathy, cervical region: Secondary | ICD-10-CM | POA: Insufficient documentation

## 2020-02-28 DIAGNOSIS — Z79891 Long term (current) use of opiate analgesic: Secondary | ICD-10-CM | POA: Diagnosis present

## 2020-02-28 DIAGNOSIS — M79672 Pain in left foot: Secondary | ICD-10-CM | POA: Diagnosis present

## 2020-02-28 DIAGNOSIS — M542 Cervicalgia: Secondary | ICD-10-CM | POA: Diagnosis present

## 2020-02-28 DIAGNOSIS — G8929 Other chronic pain: Secondary | ICD-10-CM | POA: Diagnosis present

## 2020-02-28 DIAGNOSIS — G629 Polyneuropathy, unspecified: Secondary | ICD-10-CM | POA: Diagnosis present

## 2020-02-28 DIAGNOSIS — Z5181 Encounter for therapeutic drug level monitoring: Secondary | ICD-10-CM | POA: Diagnosis present

## 2020-02-28 MED ORDER — NUCYNTA 100 MG PO TABS
1.0000 | ORAL_TABLET | Freq: Four times a day (QID) | ORAL | 0 refills | Status: DC | PRN
Start: 2020-02-28 — End: 2020-04-01

## 2020-02-28 NOTE — Progress Notes (Signed)
Subjective:    Patient ID: Sabrina Williams, female    DOB: 15-Aug-1970, 49 y.o.   MRN: [id number]  HPI: Sabrina Williams is a 49 y.o. female who returns for follow up appointment for chronic pain and medication refill. She states her pain is located in her neck radiating into her left shoulder and left arm she describes the pain as a  achy pain. Also reports she was having left foot pain last week, she seen her podiatrist and received a cortisone injection with good relief noted. She rates her pain 3. Her current exercise regime is walking and performing stretching exercises.  Sabrina Williams Morphine equivalent is 160 MME.  Last UDS was Performed on 01/04/2020, it was consistent.    Pain Inventory Average Pain 4 Pain Right Now 3 My pain is sharp and burning  In the last 24 hours, has pain interfered with the following? General activity 6 Relation with others 4 Enjoyment of life 4 What TIME of day is your pain at its worst? evening Sleep (in general) Fair  Pain is worse with: walking, standing and some activites Pain improves with: rest, heat/ice, therapy/exercise and medication Relief from Meds: 6  Family History  Problem Relation Age of Onset  . Cancer Mother        multiple myeloma  . Diabetes Father   . Kidney disease Father   . Heart disease Father    Social History   Socioeconomic History  . Marital status: Married    Spouse name: Not on file  . Number of children: 2  . Years of education: some college  . Highest education level: Not on file  Occupational History  . Occupation: homemaker  Tobacco Use  . Smoking status: Never Smoker  . Smokeless tobacco: Never Used  Substance and Sexual Activity  . Alcohol use: No    Alcohol/week: 0.0 standard drinks  . Drug use: No  . Sexual activity: Not on file  Other Topics Concern  . Not on file  Social History Narrative   Lives at home with husband.   Right-handed.   No daily caffeine use.   Social Determinants of  Health   Financial Resource Strain:   . Difficulty of Paying Living Expenses: Not on file  Food Insecurity:   . Worried About Charity fundraiser in the Last Year: Not on file  . Ran Out of Food in the Last Year: Not on file  Transportation Needs:   . Lack of Transportation (Medical): Not on file  . Lack of Transportation (Non-Medical): Not on file  Physical Activity:   . Days of Exercise per Week: Not on file  . Minutes of Exercise per Session: Not on file  Stress:   . Feeling of Stress : Not on file  Social Connections:   . Frequency of Communication with Friends and Family: Not on file  . Frequency of Social Gatherings with Friends and Family: Not on file  . Attends Religious Services: Not on file  . Active Member of Clubs or Organizations: Not on file  . Attends Archivist Meetings: Not on file  . Marital Status: Not on file   Past Surgical History:  Procedure Laterality Date  . CERVICAL SPINE SURGERY     C6-7 fusion  . CESAREAN SECTION     x2  . dental implant  July 2015  . ENDOSCOPIC PLANTAR FASCIOTOMY Right August 2015  . GASTRIC BYPASS    . HAND SURGERY Right   .  LITHOTRIPSY     multiple  . PLANTAR FASCIECTOMY Right   . SHOULDER SURGERY Left 10/2004  . SMALL INTESTINE SURGERY     cervical laminectomy  . utereroscopy  1995   Past Surgical History:  Procedure Laterality Date  . CERVICAL SPINE SURGERY     C6-7 fusion  . CESAREAN SECTION     x2  . dental implant  July 2015  . ENDOSCOPIC PLANTAR FASCIOTOMY Right August 2015  . GASTRIC BYPASS    . HAND SURGERY Right   . LITHOTRIPSY     multiple  . PLANTAR FASCIECTOMY Right   . SHOULDER SURGERY Left 10/2004  . SMALL INTESTINE SURGERY     cervical laminectomy  . utereroscopy  1995   Past Medical History:  Diagnosis Date  . Anxiety   . Chronic kidney disease    stones  . Chronic pain   . Complex regional pain syndrome I    right foot  . Depression   . Diabetes mellitus   . Hypertension    . Kidney stone   . Neuromuscular disorder ([place])   . Tremor    BP 115/79   Pulse 74   Temp 97.9 F (36.6 C)   Ht _0  (1.702 m)   Wt 284 lb (128.8 kg)   SpO2 97%   BMI 44.48 kg/m   Opioid Risk Score:   Fall Risk Score:  `1  Depression screen PHQ 2/9  Depression screen [facility] 2/9 01/04/2020 12/04/2019 10/25/2019 07/10/2018 06/12/2018 02/17/2018 07/01/2017  Decreased Interest 0 0 0 0 0 0 0  Down, Depressed, Hopeless 0 0 0 0 0 0 0  PHQ - 2 Score 0 0 0 0 0 0 0  Altered sleeping - - - - - - -  Tired, decreased energy - - - - - - -  Change in appetite - - - - - - -  Feeling bad or failure about yourself  - - - - - - -  Trouble concentrating - - - - - - -  Moving slowly or fidgety/restless - - - - - - -  Suicidal thoughts - - - - - - -  PHQ-9 Score - - - - - - -    Review of Systems  Constitutional: Negative.   HENT: Negative.   Eyes: Negative.   Respiratory: Negative.   Cardiovascular: Negative.   Gastrointestinal: Negative.   Endocrine: Negative.   Genitourinary: Negative.   Musculoskeletal: Positive for arthralgias, back pain and neck pain.  Skin: Negative.   Allergic/Immunologic: Negative.   Neurological: Negative.   Hematological: Negative.   Psychiatric/Behavioral: Negative.   All other systems reviewed and are negative.      Objective:   Physical Exam Vitals and nursing note reviewed.  Constitutional:      Appearance: Normal appearance. She is obese.  Cardiovascular:     Rate and Rhythm: Normal rate and regular rhythm.     Pulses: Normal pulses.     Heart sounds: Normal heart sounds.  Pulmonary:     Effort: Pulmonary effort is normal.     Breath sounds: Normal breath sounds.  Musculoskeletal:     Cervical back: Normal range of motion and neck supple.     Comments: Normal Muscle Bulk and Muscle Testing Reveals:  Upper Extremities: Full ROM and Muscle Strength 5/5 Left AC Joint Tenderness Lower Extremities: Full ROM and Muscle Strength 5/5 Arises from Table  with ease Narrow Based Gait   Skin:    General:  Skin is warm and dry.  Neurological:     Mental Status: She is alert and oriented to person, place, and time.  Psychiatric:        Mood and Affect: Mood normal.        Behavior: Behavior normal.           Assessment & Plan:  1.Complex regional pain syndrome right lower extremity postoperative. She's s/p post plantar fascial release. She continue withsensitivity totouch. She has diffuse numbness and tingling.ContinueGabapentin.02/28/2020. Refilled: Nucynta 100 mg one capsules every 6 hours as needed for pain #120. We will continue the opioid monitoring program, this consists of regular clinic visits, examinations, urine drug screen, pill counts as well as use of [place] Controlled Substance Reporting system. A 12 month History has been reviewed on the [place] Controlled Substance Reporting System on 02/28/2020. 2.Myofascial pain syndrome:Continuecurrent medication regime withTizanidine and ice, heat and exercise regime.02/28/2020 3. Depression: Continue: Zoloft.PCP Following.02/28/2020 4. Cervical Post Laminectomy: Continue to Monitor.02/28/2020 5.Cervicalgia/Cervical Radiculopathy/ Bilateral Hand Painwith tingling, Leftarm and lefthand with tingling and burning.Continuecurrent medication regime withGabapentin10/11/2019 6. PolyNeuropathy:ContinueGabapentin:Continue to Monitor.02/28/2020 7.ChronicLeftShoulder Pain:OrthoFollowing.Continue current medication regime and Continue to Monitor.02/28/2020 8. Right hand pain/ Post Surgical Procedure:Trigger Finger Release: Dr. Jerilee Hoh Following. No complaints Today.02/28/2020. 9. Tremor:No complaints today. Continue withGabapentin decreased to 600 mg five times a day. 02/28/2020 10.RightKnee Pain: No complaints today.Continue with HEP as Tolerated. Continue to Monitor.02/28/2020 11.Chronic LeftFoott Pain. Podiatry Following.Continue  with HEP as tolerated. Continue current medication regimen. Continue to monitor.02/28/2020  F/U in 1 month  78mnutes of face to face patient care time was spent during this visit. All questions were encouraged and answered.

## 2020-04-03 ENCOUNTER — Other Ambulatory Visit: Payer: Self-pay

## 2020-04-03 ENCOUNTER — Encounter: Payer: Self-pay | Admitting: Registered Nurse

## 2020-04-03 ENCOUNTER — Encounter: Payer: 59 | Attending: Physical Medicine & Rehabilitation | Admitting: Registered Nurse

## 2020-04-03 VITALS — BP 120/83 | HR 94 | Temp 97.9°F | Ht 67.0 in | Wt 277.6 lb

## 2020-04-03 DIAGNOSIS — Z79891 Long term (current) use of opiate analgesic: Secondary | ICD-10-CM

## 2020-04-03 DIAGNOSIS — R200 Anesthesia of skin: Secondary | ICD-10-CM | POA: Diagnosis present

## 2020-04-03 DIAGNOSIS — G90521 Complex regional pain syndrome I of right lower limb: Secondary | ICD-10-CM | POA: Insufficient documentation

## 2020-04-03 DIAGNOSIS — Z5181 Encounter for therapeutic drug level monitoring: Secondary | ICD-10-CM | POA: Diagnosis present

## 2020-04-03 DIAGNOSIS — M542 Cervicalgia: Secondary | ICD-10-CM | POA: Insufficient documentation

## 2020-04-03 DIAGNOSIS — G894 Chronic pain syndrome: Secondary | ICD-10-CM | POA: Insufficient documentation

## 2020-04-03 DIAGNOSIS — M961 Postlaminectomy syndrome, not elsewhere classified: Secondary | ICD-10-CM | POA: Diagnosis present

## 2020-04-03 DIAGNOSIS — G8929 Other chronic pain: Secondary | ICD-10-CM | POA: Diagnosis present

## 2020-04-03 DIAGNOSIS — M25512 Pain in left shoulder: Secondary | ICD-10-CM | POA: Diagnosis present

## 2020-04-03 DIAGNOSIS — G629 Polyneuropathy, unspecified: Secondary | ICD-10-CM | POA: Diagnosis present

## 2020-04-03 DIAGNOSIS — M7062 Trochanteric bursitis, left hip: Secondary | ICD-10-CM

## 2020-04-03 DIAGNOSIS — M5416 Radiculopathy, lumbar region: Secondary | ICD-10-CM | POA: Insufficient documentation

## 2020-04-03 DIAGNOSIS — M5412 Radiculopathy, cervical region: Secondary | ICD-10-CM | POA: Diagnosis present

## 2020-04-03 DIAGNOSIS — R202 Paresthesia of skin: Secondary | ICD-10-CM | POA: Diagnosis present

## 2020-04-03 MED ORDER — NUCYNTA 100 MG PO TABS: ORAL_TABLET | ORAL | 0 refills | Status: DC

## 2020-04-03 NOTE — Progress Notes (Signed)
Subjective:    Patient ID: Sabrina Williams, female    DOB: May 21, 1971, 49 y.o.   MRN: [id number]  HPI: Sabrina Williams is a 49 y.o. female who returns for follow up appointment for chronic pain and medication refill. She states her pain is located in her neck radiating into her left shoulder, left arm and left hand with tingling and numbness occasionally, lower back pain radiating into her left hip, left lower extremity and left foot. She rates her pain 4. Her current exercise regime is walking and performing stretching exercises.  Sabrina Williams Morphine equivalent is 160.00 MME.  UDS ordered for Today.    Pain Inventory Average Pain 5 Pain Right Now 4 My pain is intermittent, sharp, burning, dull, stabbing, tingling and aching  In the last 24 hours, has pain interfered with the following? General activity 6 Relation with others 4 Enjoyment of life 3 What TIME of day is your pain at its worst? evening Sleep (in general) Fair  Pain is worse with: walking, bending, standing and some activites Pain improves with: rest, heat/ice, therapy/exercise and medication Relief from Meds: 7  Family History  Problem Relation Age of Onset  . Cancer Mother        multiple myeloma  . Diabetes Father   . Kidney disease Father   . Heart disease Father    Social History   Socioeconomic History  . Marital status: Married    Spouse name: Not on file  . Number of children: 2  . Years of education: some college  . Highest education level: Not on file  Occupational History  . Occupation: homemaker  Tobacco Use  . Smoking status: Never Smoker  . Smokeless tobacco: Never Used  Vaping Use  . Vaping Use: Never used  Substance and Sexual Activity  . Alcohol use: No    Alcohol/week: 0.0 standard drinks  . Drug use: No  . Sexual activity: Not on file  Other Topics Concern  . Not on file  Social History Narrative   Lives at home with husband.   Right-handed.   No daily caffeine use.    Social Determinants of Health   Financial Resource Strain:   . Difficulty of Paying Living Expenses: Not on file  Food Insecurity:   . Worried About Charity fundraiser in the Last Year: Not on file  . Ran Out of Food in the Last Year: Not on file  Transportation Needs:   . Lack of Transportation (Medical): Not on file  . Lack of Transportation (Non-Medical): Not on file  Physical Activity:   . Days of Exercise per Week: Not on file  . Minutes of Exercise per Session: Not on file  Stress:   . Feeling of Stress : Not on file  Social Connections:   . Frequency of Communication with Friends and Family: Not on file  . Frequency of Social Gatherings with Friends and Family: Not on file  . Attends Religious Services: Not on file  . Active Member of Clubs or Organizations: Not on file  . Attends Archivist Meetings: Not on file  . Marital Status: Not on file   Past Surgical History:  Procedure Laterality Date  . CERVICAL SPINE SURGERY     C6-7 fusion  . CESAREAN SECTION     x2  . dental implant  July 2015  . ENDOSCOPIC PLANTAR FASCIOTOMY Right August 2015  . GASTRIC BYPASS    . HAND SURGERY Right   .  LITHOTRIPSY     multiple  . PLANTAR FASCIECTOMY Right   . SHOULDER SURGERY Left 10/2004  . SMALL INTESTINE SURGERY     cervical laminectomy  . utereroscopy  1995   Past Surgical History:  Procedure Laterality Date  . CERVICAL SPINE SURGERY     C6-7 fusion  . CESAREAN SECTION     x2  . dental implant  July 2015  . ENDOSCOPIC PLANTAR FASCIOTOMY Right August 2015  . GASTRIC BYPASS    . HAND SURGERY Right   . LITHOTRIPSY     multiple  . PLANTAR FASCIECTOMY Right   . SHOULDER SURGERY Left 10/2004  . SMALL INTESTINE SURGERY     cervical laminectomy  . utereroscopy  1995   Past Medical History:  Diagnosis Date  . Anxiety   . Chronic kidney disease    stones  . Chronic pain   . Complex regional pain syndrome I    right foot  . Depression   . Diabetes  mellitus   . Hypertension   . Kidney stone   . Neuromuscular disorder ([place])   . Tremor    BP 120/83   Pulse 94   Temp 97.9 F (36.6 C)   Ht _0  (1.702 m)   Wt 277 lb 9.6 oz (125.9 kg)   SpO2 96%   BMI 43.48 kg/m   Opioid Risk Score:   Fall Risk Score:  `1  Depression screen PHQ 2/9  Depression screen [facility] 2/9 01/04/2020 12/04/2019 10/25/2019 07/10/2018 06/12/2018 02/17/2018 07/01/2017  Decreased Interest 0 0 0 0 0 0 0  Down, Depressed, Hopeless 0 0 0 0 0 0 0  PHQ - 2 Score 0 0 0 0 0 0 0  Altered sleeping - - - - - - -  Tired, decreased energy - - - - - - -  Change in appetite - - - - - - -  Feeling bad or failure about yourself  - - - - - - -  Trouble concentrating - - - - - - -  Moving slowly or fidgety/restless - - - - - - -  Suicidal thoughts - - - - - - -  PHQ-9 Score - - - - - - -   Review of Systems  Musculoskeletal: Positive for back pain and gait problem.       Left foot, left hip, left hip, left arm & hand pain  All other systems reviewed and are negative.      Objective:   Physical Exam Vitals and nursing note reviewed.  Constitutional:      Appearance: Normal appearance.  Cardiovascular:     Rate and Rhythm: Normal rate and regular rhythm.     Pulses: Normal pulses.     Heart sounds: Normal heart sounds.  Pulmonary:     Effort: Pulmonary effort is normal.     Breath sounds: Normal breath sounds.  Musculoskeletal:     Cervical back: Normal range of motion and neck supple.     Comments: Normal Muscle Bulk and Muscle Testing Reveals:  Upper Extremities: Full ROM and Muscle Strength 5/5 Left AC Joint Tenderness  Thoracic Paraspinal Tenderness: T-7-T-9 Mainly Right Side Lumbar Paraspinal Tenderness: L-3-L-5 Lower Extremities: Full ROM and Muscle Strength 5/5 Arises from Table with Ease Narrow Based  Gait   Skin:    General: Skin is warm and dry.  Neurological:     Mental Status: She is alert and oriented to person, place, and time.  Psychiatric:  Mood and Affect: Mood normal.        Behavior: Behavior normal.           Assessment & Plan:  1.Complex regional pain syndrome right lower extremity postoperative. She's s/p post plantar fascial release. She continue withsensitivity totouch. She has diffuse numbness and tingling.ContinueGabapentin.04/03/2020. Refilled: Nucynta 100 mg one capsules every 6 hours as needed for pain #120. We will continue the opioid monitoring program, this consists of regular clinic visits, examinations, urine drug screen, pill counts as well as use of [place] Controlled Substance Reporting system. A 12 month History has been reviewed on the [place] Controlled Substance Reporting Systemon 04/03/2020. 2.Myofascial pain syndrome:Continuecurrent medication regime withTizanidine and ice, heat and exercise regime.04/03/2020 3. Depression: Continue: Zoloft.PCP Following.04/03/2020 4. Cervical Post Laminectomy: Continue to Monitor.04/03/2020 5.Cervicalgia/Cervical Radiculopathy/ Left Hand Painwith tingling, Leftarm and lefthand with tingling and burning.Continuecurrent medication regime withGabapentin11/03/2020 6. PolyNeuropathy:ContinueGabapentin:Continue to Monitor.04/03/2020 7.ChronicLeftShoulder Pain:OrthoFollowing.Continue current medication regime and Continue to Monitor.04/03/2020 8. Right hand pain/ Post Surgical Procedure:Trigger Finger Release: Dr. Jerilee Hoh Following. No complaints Today.04/03/2020. 9. Tremor:No complaints today. Continue withGabapentin decreased to 600 mg five times a day. 04/03/2020 10.RightKnee Pain:No complaints today.Continue with HEP as Tolerated. Continue to Monitor.04/03/2020 11.Chronic LeftFoot Pain. Podiatry Following.Continue with HEP as tolerated. Continue current medication regimen. Continue to monitor.04/03/2020 12. Left Greater Trochanter Bursitis: Continue to alternate Ice and Heat Therapy. Continue to  Monitor.   F/U in 1 month  85mnutes of face to face patient care time was spent during this visit. All questions were encouraged and answered.

## 2020-04-08 LAB — TOXASSURE SELECT,+ANTIDEPR,UR

## 2020-04-10 ENCOUNTER — Telehealth: Payer: Self-pay | Admitting: *Deleted

## 2020-04-10 NOTE — Telephone Encounter (Signed)
Urine drug screen for this encounter is consistent for prescribed medication 

## 2020-04-30 ENCOUNTER — Encounter: Payer: 59 | Admitting: Registered Nurse

## 2020-05-05 ENCOUNTER — Other Ambulatory Visit: Payer: Self-pay

## 2020-05-05 ENCOUNTER — Encounter: Payer: 59 | Attending: Physical Medicine & Rehabilitation | Admitting: Registered Nurse

## 2020-05-05 ENCOUNTER — Encounter: Payer: Self-pay | Admitting: Registered Nurse

## 2020-05-05 VITALS — BP 118/80 | HR 82 | Temp 97.8°F | Ht 67.0 in | Wt 285.6 lb

## 2020-05-05 DIAGNOSIS — M542 Cervicalgia: Secondary | ICD-10-CM | POA: Diagnosis present

## 2020-05-05 DIAGNOSIS — R202 Paresthesia of skin: Secondary | ICD-10-CM | POA: Diagnosis present

## 2020-05-05 DIAGNOSIS — Z79891 Long term (current) use of opiate analgesic: Secondary | ICD-10-CM | POA: Diagnosis present

## 2020-05-05 DIAGNOSIS — M1711 Unilateral primary osteoarthritis, right knee: Secondary | ICD-10-CM | POA: Insufficient documentation

## 2020-05-05 DIAGNOSIS — G8929 Other chronic pain: Secondary | ICD-10-CM | POA: Diagnosis present

## 2020-05-05 DIAGNOSIS — G629 Polyneuropathy, unspecified: Secondary | ICD-10-CM | POA: Insufficient documentation

## 2020-05-05 DIAGNOSIS — Z5181 Encounter for therapeutic drug level monitoring: Secondary | ICD-10-CM | POA: Insufficient documentation

## 2020-05-05 DIAGNOSIS — M961 Postlaminectomy syndrome, not elsewhere classified: Secondary | ICD-10-CM | POA: Diagnosis present

## 2020-05-05 DIAGNOSIS — M25512 Pain in left shoulder: Secondary | ICD-10-CM | POA: Diagnosis present

## 2020-05-05 DIAGNOSIS — M79672 Pain in left foot: Secondary | ICD-10-CM | POA: Diagnosis present

## 2020-05-05 DIAGNOSIS — G894 Chronic pain syndrome: Secondary | ICD-10-CM | POA: Insufficient documentation

## 2020-05-05 DIAGNOSIS — R200 Anesthesia of skin: Secondary | ICD-10-CM | POA: Insufficient documentation

## 2020-05-05 DIAGNOSIS — G90521 Complex regional pain syndrome I of right lower limb: Secondary | ICD-10-CM | POA: Insufficient documentation

## 2020-05-05 MED ORDER — NUCYNTA 100 MG PO TABS: ORAL_TABLET | ORAL | 0 refills | Status: DC

## 2020-05-05 NOTE — Progress Notes (Signed)
Subjective:    Patient ID: Sabrina Williams, female    DOB: 1971/02/16, 49 y.o.   MRN: [id number]  HPI: Sabrina Williams is a 49 y.o. female who returns for follow up appointment for chronic pain and medication refill. She states her pain is located in her neck radiating into her left shoulder, left arm and left hand with tingling and numbness, right knee pain and left foot pain, She also reports podiatrist is following her left foot pain. She rates her pain 6. Her current exercise regime is walking short distances and performing stretching exercises.  Sabrina Williams equivalent is 149.33 MME.    Last UDS was Performed on 04/03/2020.it was consistent.    Pain Inventory Average Pain 4 Pain Right Now 6 My pain is constant, sharp, burning, dull, stabbing, tingling and aching  In the last 24 hours, has pain interfered with the following? General activity 7 Relation with others 7 Enjoyment of life 8 What TIME of day is your pain at its worst? evening Sleep (in general) Fair  Pain is worse with: walking, standing and some activites Pain improves with: medication Relief from Meds: 5  Family History  Problem Relation Age of Onset  . Cancer Mother        multiple myeloma  . Diabetes Father   . Kidney disease Father   . Heart disease Father    Social History   Socioeconomic History  . Marital status: Married    Spouse name: Not on file  . Number of children: 2  . Years of education: some college  . Highest education level: Not on file  Occupational History  . Occupation: homemaker  Tobacco Use  . Smoking status: Never Smoker  . Smokeless tobacco: Never Used  Vaping Use  . Vaping Use: Never used  Substance and Sexual Activity  . Alcohol use: No    Alcohol/week: 0.0 standard drinks  . Drug use: No  . Sexual activity: Not on file  Other Topics Concern  . Not on file  Social History Narrative   Lives at home with husband.   Right-handed.   No daily caffeine use.    Social Determinants of Health   Financial Resource Strain: Not on file  Food Insecurity: Not on file  Transportation Needs: Not on file  Physical Activity: Not on file  Stress: Not on file  Social Connections: Not on file   Past Surgical History:  Procedure Laterality Date  . CERVICAL SPINE SURGERY     C6-7 fusion  . CESAREAN SECTION     x2  . dental implant  July 2015  . ENDOSCOPIC PLANTAR FASCIOTOMY Right August 2015  . GASTRIC BYPASS    . HAND SURGERY Right   . LITHOTRIPSY     multiple  . PLANTAR FASCIECTOMY Right   . SHOULDER SURGERY Left 10/2004  . SMALL INTESTINE SURGERY     cervical laminectomy  . utereroscopy  1995   Past Surgical History:  Procedure Laterality Date  . CERVICAL SPINE SURGERY     C6-7 fusion  . CESAREAN SECTION     x2  . dental implant  July 2015  . ENDOSCOPIC PLANTAR FASCIOTOMY Right August 2015  . GASTRIC BYPASS    . HAND SURGERY Right   . LITHOTRIPSY     multiple  . PLANTAR FASCIECTOMY Right   . SHOULDER SURGERY Left 10/2004  . SMALL INTESTINE SURGERY     cervical laminectomy  . utereroscopy  1995   Past  Medical History:  Diagnosis Date  . Anxiety   . Chronic kidney disease    stones  . Chronic pain   . Complex regional pain syndrome I    right foot  . Depression   . Diabetes mellitus   . Hypertension   . Kidney stone   . Neuromuscular disorder ([place])   . Tremor    There were no vitals taken for this visit.  Opioid Risk Score:   Fall Risk Score:  `1  Depression screen PHQ 2/9  Depression screen [facility] 2/9 04/03/2020 01/04/2020 12/04/2019 10/25/2019 07/10/2018 06/12/2018 02/17/2018  Decreased Interest 0 0 0 0 0 0 0  Down, Depressed, Hopeless 0 0 0 0 0 0 0  PHQ - 2 Score 0 0 0 0 0 0 0  Altered sleeping - - - - - - -  Tired, decreased energy - - - - - - -  Change in appetite - - - - - - -  Feeling bad or failure about yourself  - - - - - - -  Trouble concentrating - - - - - - -  Moving slowly or fidgety/restless - - - - - -  -  Suicidal thoughts - - - - - - -  PHQ-9 Score - - - - - - -   Review of Systems  Musculoskeletal: Positive for gait problem.       LEFT FOOT PAIN       Objective:   Physical Exam Vitals and nursing note reviewed.  Constitutional:      Appearance: Normal appearance. She is obese.  Cardiovascular:     Rate and Rhythm: Normal rate and regular rhythm.     Pulses: Normal pulses.     Heart sounds: Normal heart sounds.  Pulmonary:     Effort: Pulmonary effort is normal.     Breath sounds: Normal breath sounds.  Musculoskeletal:     Cervical back: Normal range of motion and neck supple.     Comments: Normal Muscle Bulk and Muscle Testing Reveals:  Upper Extremities: Full ROM and Muscle Strength 5/5 Bilateral AC Joint Tenderness Lower Extremities: Full ROM and Muscle Strength 5/5 Arises from chair with ease Narrow Based  Gait   Skin:    General: Skin is warm and dry.  Neurological:     Mental Status: She is alert and oriented to person, place, and time.  Psychiatric:        Mood and Affect: Mood normal.        Behavior: Behavior normal.           Assessment & Plan:  1.Complex regional pain syndrome right lower extremity postoperative. She's s/p post plantar fascial release. She continue withsensitivity totouch. She has diffuse numbness and tingling.ContinueGabapentin.05/05/2020. Refilled: Nucynta 100 mg one capsules every 6 hours as needed for pain#120. We will continue the opioid monitoring program, this consists of regular clinic visits, examinations, urine drug screen, pill counts as well as use of [place] Controlled Substance Reporting system. A 12 month History has been reviewed on the [place] Controlled Substance Reporting Systemon12/13/2021. 2.Myofascial pain syndrome:Continuecurrent medication regime withTizanidine and ice, heat and exercise regime.05/05/2020 3. Depression: Continue: Zoloft.PCP Following.05/05/2020 4. Cervical Post  Laminectomy: Continue to Monitor.05/05/2020 5.Cervicalgia/Cervical Radiculopathy/ Left Hand Painwith tingling, Leftarm and lefthand with tingling and burning.Continuecurrent medication regime withGabapentin12/13/2021 6. PolyNeuropathy:ContinueGabapentin:Continue to Monitor.05/05/2020 7.ChronicLeftShoulder Pain:OrthoFollowing.Continue current medication regime and Continue to Monitor.05/05/2020 8. Right hand pain/ Post Surgical Procedure:Trigger Finger Release: Dr. Jerilee Hoh Following. No complaints Today.04/03/2020. 9.  Tremor:No complaints today. Continue withGabapentin decreased to 600 mg five times a day.05/05/2020 10.RightKnee Pain:Continue with HEP as Tolerated. Continue to Monitor.05/05/2020 11.Chronic LeftFoot Pain. Podiatry Following.Continue with HEP as tolerated. Continue current medication regimen. Continue to monitor.05/05/2020 12. Left Greater Trochanter Bursitis: No complaints today. Continue to alternate Ice and Heat Therapy. Continue to Monitor. 05/05/2020  F/U in 1 month

## 2020-05-22 ENCOUNTER — Other Ambulatory Visit: Payer: Self-pay | Admitting: Registered Nurse

## 2020-06-03 ENCOUNTER — Encounter: Payer: Self-pay | Admitting: Registered Nurse

## 2020-06-03 ENCOUNTER — Other Ambulatory Visit: Payer: Self-pay

## 2020-06-03 ENCOUNTER — Encounter: Payer: 59 | Attending: Physical Medicine & Rehabilitation | Admitting: Registered Nurse

## 2020-06-03 VITALS — BP 123/88 | HR 100 | Temp 98.0°F | Ht 67.0 in | Wt 288.4 lb

## 2020-06-03 DIAGNOSIS — G90521 Complex regional pain syndrome I of right lower limb: Secondary | ICD-10-CM | POA: Diagnosis present

## 2020-06-03 DIAGNOSIS — M5412 Radiculopathy, cervical region: Secondary | ICD-10-CM | POA: Insufficient documentation

## 2020-06-03 DIAGNOSIS — M7918 Myalgia, other site: Secondary | ICD-10-CM | POA: Diagnosis present

## 2020-06-03 DIAGNOSIS — Z5181 Encounter for therapeutic drug level monitoring: Secondary | ICD-10-CM | POA: Diagnosis present

## 2020-06-03 DIAGNOSIS — M79672 Pain in left foot: Secondary | ICD-10-CM | POA: Insufficient documentation

## 2020-06-03 DIAGNOSIS — M25512 Pain in left shoulder: Secondary | ICD-10-CM | POA: Insufficient documentation

## 2020-06-03 DIAGNOSIS — G894 Chronic pain syndrome: Secondary | ICD-10-CM | POA: Insufficient documentation

## 2020-06-03 DIAGNOSIS — Z79891 Long term (current) use of opiate analgesic: Secondary | ICD-10-CM | POA: Diagnosis present

## 2020-06-03 DIAGNOSIS — M1711 Unilateral primary osteoarthritis, right knee: Secondary | ICD-10-CM | POA: Diagnosis present

## 2020-06-03 DIAGNOSIS — M542 Cervicalgia: Secondary | ICD-10-CM | POA: Diagnosis present

## 2020-06-03 DIAGNOSIS — G8929 Other chronic pain: Secondary | ICD-10-CM | POA: Insufficient documentation

## 2020-06-03 MED ORDER — NUCYNTA 100 MG PO TABS: ORAL_TABLET | ORAL | 0 refills | Status: DC

## 2020-06-03 NOTE — Progress Notes (Signed)
Subjective:    Patient ID: Sabrina Williams, female    DOB: 1971/01/02, 50 y.o.   MRN: [id number]  HPI: Sabrina Williams is a 50 y.o. female who returns for follow up appointment for chronic pain and medication refill. She states her pain is located in her neck radiating into her left shoulder, right knee pain and left foot pain. She rates her pain 4. Her  current exercise regime is walking and performing stretching exercises.  [name] Morphine equivalent is 160.00 MME.    Last UDS was Performed on 04/03/2020, it was consistent.  Pain Inventory Average Pain 4 Pain Right Now 4 My pain is constant, sharp, burning, dull, stabbing, tingling and aching  In the last 24 hours, has pain interfered with the following? General activity 7 Relation with others 4 Enjoyment of life 3 What TIME of day is your pain at its worst? evening Sleep (in general) Fair  Pain is worse with: walking, standing and some activites Pain improves with: heat/ice, therapy/exercise and medication Relief from Meds: 7  Family History  Problem Relation Age of Onset  . Cancer Mother        multiple myeloma  . Diabetes Father   . Kidney disease Father   . Heart disease Father    Social History   Socioeconomic History  . Marital status: Married    Spouse name: Not on file  . Number of children: 2  . Years of education: some college  . Highest education level: Not on file  Occupational History  . Occupation: homemaker  Tobacco Use  . Smoking status: Never Smoker  . Smokeless tobacco: Never Used  Vaping Use  . Vaping Use: Never used  Substance and Sexual Activity  . Alcohol use: No    Alcohol/week: 0.0 standard drinks  . Drug use: No  . Sexual activity: Not on file  Other Topics Concern  . Not on file  Social History Narrative   Lives at home with husband.   Right-handed.   No daily caffeine use.   Social Determinants of Health   Financial Resource Strain: Not on file  Food Insecurity: Not  on file  Transportation Needs: Not on file  Physical Activity: Not on file  Stress: Not on file  Social Connections: Not on file   Past Surgical History:  Procedure Laterality Date  . CERVICAL SPINE SURGERY     C6-7 fusion  . CESAREAN SECTION     x2  . dental implant  July 2015  . ENDOSCOPIC PLANTAR FASCIOTOMY Right August 2015  . GASTRIC BYPASS    . HAND SURGERY Right   . LITHOTRIPSY     multiple  . PLANTAR FASCIECTOMY Right   . SHOULDER SURGERY Left 10/2004  . SMALL INTESTINE SURGERY     cervical laminectomy  . utereroscopy  1995   Past Surgical History:  Procedure Laterality Date  . CERVICAL SPINE SURGERY     C6-7 fusion  . CESAREAN SECTION     x2  . dental implant  July 2015  . ENDOSCOPIC PLANTAR FASCIOTOMY Right August 2015  . GASTRIC BYPASS    . HAND SURGERY Right   . LITHOTRIPSY     multiple  . PLANTAR FASCIECTOMY Right   . SHOULDER SURGERY Left 10/2004  . SMALL INTESTINE SURGERY     cervical laminectomy  . utereroscopy  1995   Past Medical History:  Diagnosis Date  . Anxiety   . Chronic kidney disease    stones  .  Chronic pain   . Complex regional pain syndrome I    right foot  . Depression   . Diabetes mellitus   . Hypertension   . Kidney stone   . Neuromuscular disorder ([place])   . Tremor    There were no vitals taken for this visit.  Opioid Risk Score:   Fall Risk Score:  `1  Depression screen PHQ 2/9  Depression screen [facility] 2/9 05/05/2020 04/03/2020 01/04/2020 12/04/2019 10/25/2019 07/10/2018 06/12/2018  Decreased Interest 0 0 0 0 0 0 0  Down, Depressed, Hopeless - 0 0 0 0 0 0  PHQ - 2 Score 0 0 0 0 0 0 0  Altered sleeping - - - - - - -  Tired, decreased energy - - - - - - -  Change in appetite - - - - - - -  Feeling bad or failure about yourself  - - - - - - -  Trouble concentrating - - - - - - -  Moving slowly or fidgety/restless - - - - - - -  Suicidal thoughts - - - - - - -  PHQ-9 Score - - - - - - -     Review of Systems   Musculoskeletal: Positive for gait problem.       Left foot pain  All other systems reviewed and are negative.      Objective:   Physical Exam Vitals and nursing note reviewed.  Constitutional:      Appearance: Normal appearance. She is obese.  Neck:     Comments: Cervical Paraspinal Tenderness: C-5-C-6 Mainly Left Side  Cardiovascular:     Rate and Rhythm: Normal rate and regular rhythm.     Pulses: Normal pulses.     Heart sounds: Normal heart sounds.  Pulmonary:     Effort: Pulmonary effort is normal.     Breath sounds: Normal breath sounds.  Musculoskeletal:     Cervical back: Normal range of motion and neck supple.     Comments: Normal Muscle Bulk and Muscle Testing Reveals:  Upper Extremities: Full ROM and Muscle Strength 5/5 Left AC Joint Tenderness  Thoracic Paraspinal Tenderness: T-2-T-3 Mainly Right Side  Lower Extremities: Full ROM and Muscle Strength 5/5 Arises from Table with ease Narrow Based Gait   Skin:    General: Skin is warm and dry.  Neurological:     Mental Status: She is alert and oriented to person, place, and time.  Psychiatric:        Mood and Affect: Mood normal.        Behavior: Behavior normal.           Assessment & Plan:  1.Complex regional pain syndrome right lower extremity postoperative. She's s/p post plantar fascial release. She continue withsensitivity totouch. She has diffuse numbness and tingling.ContinueGabapentin.06/03/2020. Refilled: Nucynta 100 mg one capsules every 6 hours as needed for pain#120. We will continue the opioid monitoring program, this consists of regular clinic visits, examinations, urine drug screen, pill counts as well as use of [place] Controlled Substance Reporting system. A 12 month History has been reviewed on the [place] Controlled Substance Reporting Systemon01/03/2021. 2.Myofascial pain syndrome:Continuecurrent medication regime withTizanidine and ice, heat and exercise  regime.06/03/2020 3. Depression: Continue: Zoloft.PCP Following.06/03/2020 4. Cervical Post Laminectomy: Continue to Monitor.06/03/2020 5.Cervicalgia/Cervical Radiculopathy/LeftHand Painwith tingling, Leftarm and lefthand with tingling and burning.Continuecurrent medication regime withGabapentin01/03/2021 6. PolyNeuropathy:ContinueGabapentin:Continue to Monitor.06/03/2020 7.ChronicLeftShoulder Pain:OrthoFollowing.Continue current medication regime and Continue to Monitor.06/03/2020 8. Right hand pain/ Post Surgical Procedure:Trigger  Finger Release: Dr. Jerilee Hoh Following. No complaints Today.06/03/2020. 9. Tremor:No complaints today. Continue withGabapentin decreased to 600 mg five times a day.01/1`/2022 10.RightKnee Pain:Continue with HEP as Tolerated. Continue to Monitor.06/03/2020 11.Chronic LeftFoot Pain. Podiatry Following.Continue with HEP as tolerated. Continue current medication regimen. Continue to monitor.06/03/2020 12. Left Greater Trochanter Bursitis: No complaints today. Continue to alternate Ice and Heat Therapy. Continue to Monitor.06/03/2020  F/U in 1 month

## 2020-06-16 ENCOUNTER — Other Ambulatory Visit: Payer: Self-pay | Admitting: Registered Nurse

## 2020-06-17 ENCOUNTER — Other Ambulatory Visit: Payer: Self-pay | Admitting: *Deleted

## 2020-06-17 MED ORDER — TIZANIDINE HCL 4 MG PO TABS: 4.0000 mg | ORAL_TABLET | Freq: Every day | ORAL | 5 refills | Status: DC | PRN

## 2020-07-02 ENCOUNTER — Encounter: Payer: 59 | Attending: Physical Medicine & Rehabilitation | Admitting: Registered Nurse

## 2020-07-02 ENCOUNTER — Other Ambulatory Visit: Payer: Self-pay

## 2020-07-02 ENCOUNTER — Encounter: Payer: Self-pay | Admitting: Registered Nurse

## 2020-07-02 VITALS — BP 131/89 | HR 89 | Temp 98.0°F | Ht 67.0 in | Wt 293.8 lb

## 2020-07-02 DIAGNOSIS — M25512 Pain in left shoulder: Secondary | ICD-10-CM | POA: Insufficient documentation

## 2020-07-02 DIAGNOSIS — G894 Chronic pain syndrome: Secondary | ICD-10-CM | POA: Diagnosis present

## 2020-07-02 DIAGNOSIS — Z5181 Encounter for therapeutic drug level monitoring: Secondary | ICD-10-CM

## 2020-07-02 DIAGNOSIS — G8929 Other chronic pain: Secondary | ICD-10-CM | POA: Diagnosis present

## 2020-07-02 DIAGNOSIS — M79674 Pain in right toe(s): Secondary | ICD-10-CM

## 2020-07-02 DIAGNOSIS — M1711 Unilateral primary osteoarthritis, right knee: Secondary | ICD-10-CM

## 2020-07-02 DIAGNOSIS — Z79891 Long term (current) use of opiate analgesic: Secondary | ICD-10-CM | POA: Diagnosis present

## 2020-07-02 DIAGNOSIS — M5412 Radiculopathy, cervical region: Secondary | ICD-10-CM | POA: Diagnosis present

## 2020-07-02 DIAGNOSIS — G90521 Complex regional pain syndrome I of right lower limb: Secondary | ICD-10-CM

## 2020-07-02 DIAGNOSIS — M542 Cervicalgia: Secondary | ICD-10-CM | POA: Diagnosis not present

## 2020-07-02 DIAGNOSIS — M778 Other enthesopathies, not elsewhere classified: Secondary | ICD-10-CM | POA: Diagnosis present

## 2020-07-02 DIAGNOSIS — M79672 Pain in left foot: Secondary | ICD-10-CM | POA: Insufficient documentation

## 2020-07-02 DIAGNOSIS — M7582 Other shoulder lesions, left shoulder: Secondary | ICD-10-CM

## 2020-07-02 MED ORDER — NUCYNTA 100 MG PO TABS: ORAL_TABLET | ORAL | 0 refills | Status: DC

## 2020-07-02 MED ORDER — PREDNISONE 10 MG (21) PO TBPK: ORAL_TABLET | ORAL | 0 refills | Status: DC

## 2020-07-02 NOTE — Patient Instructions (Signed)
Send a My-Chart message in a week with an update on medication change.

## 2020-07-02 NOTE — Progress Notes (Signed)
Subjective:    Patient ID: Sabrina Williams, female    DOB: Oct 24, 1970, 50 y.o.   MRN: [id number]  HPI: Sabrina Williams is a 50 y.o. female who returns for follow up appointment for chronic pain and medication refill. She states her  pain is located in her neck radiating into her left shoulder, left arm and left hand with tingling. She also reports left foot pain and right great toe. She rates her pain 4. Her current exercise regime is walking.  [name] Morphine equivalent is 160.00 MME.    Last UDS was Performed on 04/03/2020, it was consistent.    Pain Inventory Average Pain 5 Pain Right Now 4 My pain is intermittent, constant, sharp, burning, dull, stabbing, tingling and aching  In the last 24 hours, has pain interfered with the following? General activity 8 Relation with others 5 Enjoyment of life 8 What TIME of day is your pain at its worst? evening Sleep (in general) Fair  Pain is worse with: walking, standing and some activites Pain improves with: rest, heat/ice, therapy/exercise and medication Relief from Meds: 6  Family History  Problem Relation Age of Onset  . Cancer Mother        multiple myeloma  . Diabetes Father   . Kidney disease Father   . Heart disease Father    Social History   Socioeconomic History  . Marital status: Married    Spouse name: Not on file  . Number of children: 2  . Years of education: some college  . Highest education level: Not on file  Occupational History  . Occupation: homemaker  Tobacco Use  . Smoking status: Never Smoker  . Smokeless tobacco: Never Used  Vaping Use  . Vaping Use: Never used  Substance and Sexual Activity  . Alcohol use: No    Alcohol/week: 0.0 standard drinks  . Drug use: No  . Sexual activity: Not on file  Other Topics Concern  . Not on file  Social History Narrative   Lives at home with husband.   Right-handed.   No daily caffeine use.   Social Determinants of Health   Financial Resource  Strain: Not on file  Food Insecurity: Not on file  Transportation Needs: Not on file  Physical Activity: Not on file  Stress: Not on file  Social Connections: Not on file   Past Surgical History:  Procedure Laterality Date  . CERVICAL SPINE SURGERY     C6-7 fusion  . CESAREAN SECTION     x2  . dental implant  July 2015  . ENDOSCOPIC PLANTAR FASCIOTOMY Right August 2015  . GASTRIC BYPASS    . HAND SURGERY Right   . LITHOTRIPSY     multiple  . PLANTAR FASCIECTOMY Right   . SHOULDER SURGERY Left 10/2004  . SMALL INTESTINE SURGERY     cervical laminectomy  . utereroscopy  1995   Past Surgical History:  Procedure Laterality Date  . CERVICAL SPINE SURGERY     C6-7 fusion  . CESAREAN SECTION     x2  . dental implant  July 2015  . ENDOSCOPIC PLANTAR FASCIOTOMY Right August 2015  . GASTRIC BYPASS    . HAND SURGERY Right   . LITHOTRIPSY     multiple  . PLANTAR FASCIECTOMY Right   . SHOULDER SURGERY Left 10/2004  . SMALL INTESTINE SURGERY     cervical laminectomy  . utereroscopy  1995   Past Medical History:  Diagnosis Date  . Anxiety   .  Chronic kidney disease    stones  . Chronic pain   . Complex regional pain syndrome I    right foot  . Depression   . Diabetes mellitus   . Hypertension   . Kidney stone   . Neuromuscular disorder ([place])   . Tremor    BP (!) 143/96   Pulse 89   Temp 98 F (36.7 C)   Ht $R'5\' 7"'Wl$  (1.702 m)   Wt 293 lb 12.8 oz (133.3 kg)   SpO2 98%   BMI 46.02 kg/m   Opioid Risk Score:   Fall Risk Score:  `1  Depression screen PHQ 2/9  Depression screen [facility] 2/9 05/05/2020 04/03/2020 01/04/2020 12/04/2019 10/25/2019 07/10/2018 06/12/2018  Decreased Interest 0 0 0 0 0 0 0  Down, Depressed, Hopeless - 0 0 0 0 0 0  PHQ - 2 Score 0 0 0 0 0 0 0  Altered sleeping - - - - - - -  Tired, decreased energy - - - - - - -  Change in appetite - - - - - - -  Feeling bad or failure about yourself  - - - - - - -  Trouble concentrating - - - - - - -  Moving  slowly or fidgety/restless - - - - - - -  Suicidal thoughts - - - - - - -  PHQ-9 Score - - - - - - -  Some recent data might be hidden   Review of Systems  Musculoskeletal:       Pain in left arm, left shoulder, left hand & both feet  All other systems reviewed and are negative.      Objective:   Physical Exam Vitals and nursing note reviewed.  Constitutional:      Appearance: Normal appearance. She is obese.  Neck:     Comments: Cervical Paraspinal Tenderness: C-5-C-6 Cardiovascular:     Rate and Rhythm: Normal rate and regular rhythm.     Pulses: Normal pulses.     Heart sounds: Normal heart sounds.  Pulmonary:     Effort: Pulmonary effort is normal.     Breath sounds: Normal breath sounds.  Musculoskeletal:     Cervical back: Normal range of motion and neck supple.     Comments: Normal Muscle Bulk and Muscle Testing Reveals: Upper Extremities: Full ROM and Muscle Strength 5/5 Left AC Joint Tenderness Lower Extremities: Full ROM and Muscle Strength 5/5 Arises from Table with ease Narrow Based Gait   Skin:    General: Skin is warm and dry.  Neurological:     Mental Status: She is alert and oriented to person, place, and time.  Psychiatric:        Mood and Affect: Mood normal.        Behavior: Behavior normal.           Assessment & Plan:  1.Complex regional pain syndrome right lower extremity postoperative. She's s/p post plantar fascial release. She continue withsensitivity totouch. She has diffuse numbness and tingling.ContinueGabapentin.07/02/2020. Refilled: Nucynta 100 mg one capsules every 6 hours as needed for pain#120. We will continue the opioid monitoring program, this consists of regular clinic visits, examinations, urine drug screen, pill counts as well as use of [place] Controlled Substance Reporting system. A 12 month History has been reviewed on the [place] Controlled Substance Reporting Systemon02/01/2021. 2.Myofascial pain  syndrome:Continuecurrent medication regime withTizanidine and ice, heat and exercise regime.07/02/2020 3. Depression: Continue: Zoloft.PCP Following.07/02/2020 4. Cervical Post Laminectomy: Continue  to Monitor.07/02/2020 5.Cervicalgia/Cervical Radiculopathy/LeftHand Painwith tingling, Leftarm and lefthand with tingling and burning.Continuecurrent medication regime withGabapentin02/01/2021 6. PolyNeuropathy:ContinueGabapentin:Continue to Monitor.07/02/2020 7.Left Shoulder Tendonitis: EN:[id number]: ChronicLeftShoulder Pain:OrthoFollowing.Continue current medication regime and Continue to Monitor.07/02/2020 8. Right hand pain/ Post Surgical Procedure:Trigger Finger Release: Dr. Jerilee Hoh Following. No complaints Today.07/02/2020. 9. Tremor:No complaints today. Continue withGabapentin decreased to 600 mg five times a day.02/09//2022 10.RightKnee Pain: Continue with HEP as Tolerated. Continue to Monitor.07/02/2020 11.Chronic LeftFoot Pain. Podiatry Following.Continue with HEP as tolerated. Continue current medication regimen. Continue to monitor.07/02/2020 12. Left Greater Trochanter Bursitis:No complaints today.Continue to alternate Ice and Heat Therapy. Continue to Monitor.07/02/2020 13. Right Great Toe Pain: Continue to monitor. No swelling noted.   F/U in 1 month

## 2020-07-06 ENCOUNTER — Encounter: Payer: Self-pay | Admitting: Registered Nurse

## 2020-07-30 ENCOUNTER — Encounter: Payer: 59 | Admitting: Registered Nurse

## 2020-07-31 ENCOUNTER — Other Ambulatory Visit: Payer: Self-pay

## 2020-07-31 ENCOUNTER — Encounter: Payer: Self-pay | Admitting: Registered Nurse

## 2020-07-31 ENCOUNTER — Ambulatory Visit
Admission: RE | Admit: 2020-07-31 | Discharge: 2020-07-31 | Disposition: A | Discharge status: Home / Self Care | Payer: 59 | Source: Ambulatory Visit | Attending: Registered Nurse | Admitting: Registered Nurse

## 2020-07-31 ENCOUNTER — Encounter: Payer: 59 | Attending: Physical Medicine & Rehabilitation | Admitting: Registered Nurse

## 2020-07-31 VITALS — BP 134/84 | HR 70 | Temp 97.8°F | Ht 67.0 in | Wt 291.2 lb

## 2020-07-31 DIAGNOSIS — G894 Chronic pain syndrome: Secondary | ICD-10-CM | POA: Insufficient documentation

## 2020-07-31 DIAGNOSIS — Z79891 Long term (current) use of opiate analgesic: Secondary | ICD-10-CM | POA: Diagnosis present

## 2020-07-31 DIAGNOSIS — M25512 Pain in left shoulder: Secondary | ICD-10-CM | POA: Diagnosis present

## 2020-07-31 DIAGNOSIS — G8929 Other chronic pain: Secondary | ICD-10-CM | POA: Diagnosis present

## 2020-07-31 DIAGNOSIS — G629 Polyneuropathy, unspecified: Secondary | ICD-10-CM | POA: Insufficient documentation

## 2020-07-31 DIAGNOSIS — M1711 Unilateral primary osteoarthritis, right knee: Secondary | ICD-10-CM | POA: Diagnosis present

## 2020-07-31 DIAGNOSIS — G90521 Complex regional pain syndrome I of right lower limb: Secondary | ICD-10-CM | POA: Diagnosis present

## 2020-07-31 DIAGNOSIS — M1712 Unilateral primary osteoarthritis, left knee: Secondary | ICD-10-CM | POA: Insufficient documentation

## 2020-07-31 DIAGNOSIS — M7918 Myalgia, other site: Secondary | ICD-10-CM | POA: Diagnosis present

## 2020-07-31 DIAGNOSIS — Z5181 Encounter for therapeutic drug level monitoring: Secondary | ICD-10-CM | POA: Diagnosis not present

## 2020-07-31 DIAGNOSIS — M5412 Radiculopathy, cervical region: Secondary | ICD-10-CM | POA: Diagnosis not present

## 2020-07-31 DIAGNOSIS — M79672 Pain in left foot: Secondary | ICD-10-CM | POA: Diagnosis present

## 2020-07-31 DIAGNOSIS — M79674 Pain in right toe(s): Secondary | ICD-10-CM | POA: Diagnosis present

## 2020-07-31 DIAGNOSIS — M542 Cervicalgia: Secondary | ICD-10-CM | POA: Diagnosis present

## 2020-07-31 MED ORDER — NUCYNTA 100 MG PO TABS: ORAL_TABLET | ORAL | 0 refills | Status: DC

## 2020-07-31 MED ORDER — GABAPENTIN 600 MG PO TABS
600.0000 mg | ORAL_TABLET | Freq: Every day | ORAL | 3 refills | Status: DC
Start: 2020-07-31 — End: 2020-11-03

## 2020-07-31 NOTE — Progress Notes (Signed)
Subjective:    Patient ID: Sabrina Williams, female    DOB: January 20, 1971, 50 y.o.   MRN: [id number]  HPI: Sabrina Williams is a 50 y.o. female who returns for follow up appointment for chronic pain and medication refill. She states her  pain is located in her neck radiating into her left shoulder, leftarm and left hand with tingling and burning. Also reports bilateral knee pain, bilateral feet pain with tingling and burning and right gret toe pain. She rates her pain 4. Her current exercise regime is walking and performing stretching exercises.  Ms. Meland Morphine equivalent is 160.00  MME.  UDS today.    Pain Inventory Average Pain 5 Pain Right Now 4 My pain is constant, sharp, burning and aching  In the last 24 hours, has pain interfered with the following? General activity 8 Relation with others 5 Enjoyment of life 7 What TIME of day is your pain at its worst? evening Sleep (in general) Fair  Pain is worse with: walking, inactivity and some activites Pain improves with: rest, therapy/exercise and medication Relief from Meds: 6  Family History  Problem Relation Age of Onset  . Cancer Mother        multiple myeloma  . Diabetes Father   . Kidney disease Father   . Heart disease Father    Social History   Socioeconomic History  . Marital status: Married    Spouse name: Not on file  . Number of children: 2  . Years of education: some college  . Highest education level: Not on file  Occupational History  . Occupation: homemaker  Tobacco Use  . Smoking status: Never Smoker  . Smokeless tobacco: Never Used  Vaping Use  . Vaping Use: Never used  Substance and Sexual Activity  . Alcohol use: No    Alcohol/week: 0.0 standard drinks  . Drug use: No  . Sexual activity: Not on file  Other Topics Concern  . Not on file  Social History Narrative   Lives at home with husband.   Right-handed.   No daily caffeine use.   Social Determinants of Health   Financial  Resource Strain: Not on file  Food Insecurity: Not on file  Transportation Needs: Not on file  Physical Activity: Not on file  Stress: Not on file  Social Connections: Not on file   Past Surgical History:  Procedure Laterality Date  . CERVICAL SPINE SURGERY     C6-7 fusion  . CESAREAN SECTION     x2  . dental implant  July 2015  . ENDOSCOPIC PLANTAR FASCIOTOMY Right August 2015  . GASTRIC BYPASS    . HAND SURGERY Right   . LITHOTRIPSY     multiple  . PLANTAR FASCIECTOMY Right   . SHOULDER SURGERY Left 10/2004  . SMALL INTESTINE SURGERY     cervical laminectomy  . utereroscopy  1995   Past Surgical History:  Procedure Laterality Date  . CERVICAL SPINE SURGERY     C6-7 fusion  . CESAREAN SECTION     x2  . dental implant  July 2015  . ENDOSCOPIC PLANTAR FASCIOTOMY Right August 2015  . GASTRIC BYPASS    . HAND SURGERY Right   . LITHOTRIPSY     multiple  . PLANTAR FASCIECTOMY Right   . SHOULDER SURGERY Left 10/2004  . SMALL INTESTINE SURGERY     cervical laminectomy  . utereroscopy  1995   Past Medical History:  Diagnosis Date  . Anxiety   .  Chronic kidney disease    stones  . Chronic pain   . Complex regional pain syndrome I    right foot  . Depression   . Diabetes mellitus   . Hypertension   . Kidney stone   . Neuromuscular disorder ([place])   . Tremor    There were no vitals taken for this visit.  Opioid Risk Score:   Fall Risk Score:  `1  Depression screen PHQ 2/9  Depression screen [facility] 2/9 07/02/2020 05/05/2020 04/03/2020 01/04/2020 12/04/2019 10/25/2019 07/10/2018  Decreased Interest 0 0 0 0 0 0 0  Down, Depressed, Hopeless 0 - 0 0 0 0 0  PHQ - 2 Score 0 0 0 0 0 0 0  Altered sleeping - - - - - - -  Tired, decreased energy - - - - - - -  Change in appetite - - - - - - -  Feeling bad or failure about yourself  - - - - - - -  Trouble concentrating - - - - - - -  Moving slowly or fidgety/restless - - - - - - -  Suicidal thoughts - - - - - - -  PHQ-9  Score - - - - - - -  Some recent data might be hidden   Review of Systems  Musculoskeletal: Positive for gait problem and neck pain.       Pain in left shoulder, neck, arm, & legs  All other systems reviewed and are negative.      Objective:   Physical Exam Vitals and nursing note reviewed.  Constitutional:      Appearance: Normal appearance. She is obese.  Cardiovascular:     Rate and Rhythm: Normal rate and regular rhythm.     Pulses: Normal pulses.     Heart sounds: Normal heart sounds.  Pulmonary:     Effort: Pulmonary effort is normal.     Breath sounds: Normal breath sounds.  Musculoskeletal:     Cervical back: Normal range of motion and neck supple.     Comments: Normal Muscle Bulk and Muscle Testing Reveals:  Upper Extremities: Full ROM and Muscle Strength 5/5 Thoracic Paraspinal Tenderness: T-2-T-4 Lower Extremities: Full ROM and Muscle Strength 5/5 Arises from Table with ease Narrow Based  Gait   Skin:    General: Skin is warm and dry.  Neurological:     Mental Status: She is alert and oriented to person, place, and time.  Psychiatric:        Mood and Affect: Mood normal.        Behavior: Behavior normal.           Assessment & Plan:  1.Complex regional pain syndrome right lower extremity postoperative. She's s/p post plantar fascial release. She continue withsensitivity totouch. She has diffuse numbness and tingling.ContinueGabapentin.07/31/2020. Refilled: Nucynta 100 mg one capsules every 6 hours as needed for pain#120. We will continue the opioid monitoring program, this consists of regular clinic visits, examinations, urine drug screen, pill counts as well as use of [place] Controlled Substance Reporting system. A 12 month History has been reviewed on the [place] Controlled Substance Reporting Systemon03/02/2021. 2.Myofascial pain syndrome:Continuecurrent medication regime withTizanidine and ice, heat and exercise  regime.03/010/2022 3. Depression: Continue: Zoloft.PCP Following.07/31/2020 4. Cervical Post Laminectomy: Continue to Monitor.07/31/2020 5.Cervicalgia/Cervical Radiculopathy/Left shoulder, Left Arm and Left Hand Painwith tingling, Continuecurrent medication regime withGabapentin03/02/2021 6. PolyNeuropathy:ContinueGabapentin:Continue to Monitor.07/31/2020 7.Left Shoulder Tendonitis: ChronicLeftShoulder Pain:OrthoFollowing.Continue current medication regime and Continue to Monitor.07/31/2020 8. Right hand pain/  Post Surgical Procedure:Trigger Finger Release: Dr. Jerilee Hoh Following. No complaints Today.07/31/2020. 9. Tremor:No complaints today. Continue withGabapentin decreased to 600 mg five times a day.03/10//2022 10.Bilateral Knee Pain: Continue with HEP as Tolerated. Continue to Monitor.07/31/2020 11.Chronic LeftFoot Pain. Podiatry Following.Continue with HEP as tolerated. Continue current medication regimen. Continue to monitor.07/31/2020 12. Left Greater Trochanter Bursitis:No complaints today.Continue to alternate Ice and Heat Therapy. Continue to Monitor.07/31/2020 13. Right Great Toe Pain: Awaiting on X-Ray Results. Continue to monitor. 07/31/2020   F/U in 1 month

## 2020-08-04 ENCOUNTER — Telehealth: Payer: Self-pay | Admitting: Registered Nurse

## 2020-08-04 NOTE — Telephone Encounter (Signed)
Placed a call to Ms. Steveson X-ray results were reviewed. She verbalizes understanding.

## 2020-08-08 ENCOUNTER — Telehealth: Payer: Self-pay | Admitting: *Deleted

## 2020-08-08 LAB — TOXASSURE SELECT,+ANTIDEPR,UR

## 2020-08-08 NOTE — Telephone Encounter (Signed)
Urine drug screen for this encounter is consistent for prescribed medication 

## 2020-08-28 ENCOUNTER — Encounter: Payer: Self-pay | Admitting: Registered Nurse

## 2020-08-28 ENCOUNTER — Encounter: Payer: 59 | Attending: Physical Medicine & Rehabilitation | Admitting: Registered Nurse

## 2020-08-28 ENCOUNTER — Other Ambulatory Visit: Payer: Self-pay

## 2020-08-28 VITALS — BP 147/92 | HR 81 | Ht 67.0 in | Wt 291.0 lb

## 2020-08-28 DIAGNOSIS — M1712 Unilateral primary osteoarthritis, left knee: Secondary | ICD-10-CM | POA: Diagnosis present

## 2020-08-28 DIAGNOSIS — Z5181 Encounter for therapeutic drug level monitoring: Secondary | ICD-10-CM

## 2020-08-28 DIAGNOSIS — M7918 Myalgia, other site: Secondary | ICD-10-CM | POA: Diagnosis present

## 2020-08-28 DIAGNOSIS — G90521 Complex regional pain syndrome I of right lower limb: Secondary | ICD-10-CM | POA: Diagnosis present

## 2020-08-28 DIAGNOSIS — M542 Cervicalgia: Secondary | ICD-10-CM

## 2020-08-28 DIAGNOSIS — Z79891 Long term (current) use of opiate analgesic: Secondary | ICD-10-CM

## 2020-08-28 DIAGNOSIS — G629 Polyneuropathy, unspecified: Secondary | ICD-10-CM

## 2020-08-28 DIAGNOSIS — M5412 Radiculopathy, cervical region: Secondary | ICD-10-CM | POA: Diagnosis present

## 2020-08-28 DIAGNOSIS — M79672 Pain in left foot: Secondary | ICD-10-CM | POA: Diagnosis present

## 2020-08-28 DIAGNOSIS — G894 Chronic pain syndrome: Secondary | ICD-10-CM

## 2020-08-28 DIAGNOSIS — G8929 Other chronic pain: Secondary | ICD-10-CM

## 2020-08-28 DIAGNOSIS — M25512 Pain in left shoulder: Secondary | ICD-10-CM | POA: Diagnosis present

## 2020-08-28 MED ORDER — NUCYNTA 100 MG PO TABS: ORAL_TABLET | ORAL | 0 refills | Status: DC

## 2020-08-28 NOTE — Progress Notes (Signed)
 Subjective:    Patient ID: Sabrina Williams, female    DOB: 07/20/1970, 50 y.o.   MRN: [id number]  HPI: Sabrina Williams is a 50 y.o. female who returns for follow up appointment for chronic pain and medication refill. She states her  pain is located in her neck radiating into her left shoulder, left arm and left hand with tingling and burning, left lower extremity pain, left knee and bilateral feet with tingling and burning. She rates her pain 7. Her  current exercise regime is walking and performing stretching exercises.  Ms. Kneebone Morphine equivalent is 120.00 MME.    Last UDS was Performed on 07/31/2020, it was consistent.    Pain Inventory Average Pain 6 Pain Right Now 7 My pain is constant, sharp, burning, stabbing and aching  In the last 24 hours, has pain interfered with the following? General activity 8 Relation with others 7 Enjoyment of life 7 What TIME of day is your pain at its worst? morning  Sleep (in general) Fair  Pain is worse with: walking and standing Pain improves with: rest and medication Relief from Meds: 5  Family History  Problem Relation Age of Onset  . Cancer Mother        multiple myeloma  . Diabetes Father   . Kidney disease Father   . Heart disease Father    Social History   Socioeconomic History  . Marital status: Married    Spouse name: Not on file  . Number of children: 2  . Years of education: some college  . Highest education level: Not on file  Occupational History  . Occupation: homemaker  Tobacco Use  . Smoking status: Never Smoker  . Smokeless tobacco: Never Used  Vaping Use  . Vaping Use: Never used  Substance and Sexual Activity  . Alcohol use: No    Alcohol/week: 0.0 standard drinks  . Drug use: No  . Sexual activity: Not on file  Other Topics Concern  . Not on file  Social History Narrative   Lives at home with husband.   Right-handed.   No daily caffeine use.   Social Determinants of Health   Financial  Resource Strain: Not on file  Food Insecurity: Not on file  Transportation Needs: Not on file  Physical Activity: Not on file  Stress: Not on file  Social Connections: Not on file   Past Surgical History:  Procedure Laterality Date  . CERVICAL SPINE SURGERY     C6-7 fusion  . CESAREAN SECTION     x2  . dental implant  July 2015  . ENDOSCOPIC PLANTAR FASCIOTOMY Right August 2015  . GASTRIC BYPASS    . HAND SURGERY Right   . LITHOTRIPSY     multiple  . PLANTAR FASCIECTOMY Right   . SHOULDER SURGERY Left 10/2004  . SMALL INTESTINE SURGERY     cervical laminectomy  . utereroscopy  1995   Past Surgical History:  Procedure Laterality Date  . CERVICAL SPINE SURGERY     C6-7 fusion  . CESAREAN SECTION     x2  . dental implant  July 2015  . ENDOSCOPIC PLANTAR FASCIOTOMY Right August 2015  . GASTRIC BYPASS    . HAND SURGERY Right   . LITHOTRIPSY     multiple  . PLANTAR FASCIECTOMY Right   . SHOULDER SURGERY Left 10/2004  . SMALL INTESTINE SURGERY     cervical laminectomy  . utereroscopy  1995   Past Medical History:  Diagnosis   Date  . Anxiety   . Chronic kidney disease    stones  . Chronic pain   . Complex regional pain syndrome I    right foot  . Depression   . Diabetes mellitus   . Hypertension   . Kidney stone   . Neuromuscular disorder ([facility])   . Tremor    There were no vitals taken for this visit.  Opioid Risk Score:   Fall Risk Score:  `1  Depression screen PHQ 2/9  Depression screen PHQ 2/9 07/31/2020 07/02/2020 05/05/2020 04/03/2020 01/04/2020 12/04/2019 10/25/2019  Decreased Interest 0 0 0 0 0 0 0  Down, Depressed, Hopeless 0 0 - 0 0 0 0  PHQ - 2 Score 0 0 0 0 0 0 0  Altered sleeping - - - - - - -  Tired, decreased energy - - - - - - -  Change in appetite - - - - - - -  Feeling bad or failure about yourself  - - - - - - -  Trouble concentrating - - - - - - -  Moving slowly or fidgety/restless - - - - - - -  Suicidal thoughts - - - - - - -  PHQ-9  Score - - - - - - -  Some recent data might be hidden    Review of Systems  Constitutional: Negative.   HENT: Negative.   Eyes: Negative.   Respiratory: Negative.   Cardiovascular: Negative.   Gastrointestinal: Negative.   Endocrine: Negative.   Musculoskeletal: Positive for gait problem.  Skin: Negative.   Allergic/Immunologic: Negative.   Neurological: Positive for numbness.  Hematological: Negative.   Psychiatric/Behavioral: Negative.   All other systems reviewed and are negative.      Objective:   Physical Exam        Assessment & Plan:  1.Complex regional pain syndrome right lower extremity postoperative. She's s/p post plantar fascial release. She continue withsensitivity totouch. She has diffuse numbness and tingling.ContinueGabapentin.08/28/2020. Refilled: Nucynta 100 mg one capsules every 6 hours as needed for pain#120. We will continue the opioid monitoring program, this consists of regular clinic visits, examinations, urine drug screen, pill counts as well as use of [place] Controlled Substance Reporting system. A 12 month History has been reviewed on the [place] Controlled Substance Reporting Systemon04/11/2020. 2.Myofascial pain syndrome:Continuecurrent medication regime withTizanidine and ice, heat and exercise regime.08/28/2020 3. Depression: Continue: Zoloft.PCP Following.08/28/2020 4. Cervical Post Laminectomy: Continue to Monitor.08/28/2020 5.Cervicalgia/Cervical Radiculopathy/Left shoulder, Left Arm and Left Hand Painwith tingling, Continuecurrent medication regime withGabapentin04/11/2020 6. PolyNeuropathy:ContinueGabapentin:Continue to Monitor.08/28/2020 7.Left Shoulder Tendonitis:ChronicLeftShoulder Pain:OrthoFollowing.Continue current medication regime and Continue to Monitor.08/28/2020 8. Right hand pain/ Post Surgical Procedure:Trigger Finger Release: Dr. Hernandez Following. No complaints  Today.08/28/2020. 9. Tremor:No complaints today. Continue withGabapentin decreased to 600 mg five times a day.04/07//2022 10.Left Knee Pain: Continue with HEP as Tolerated. Continue to Monitor.08/28/2020 11.Chronic LeftFoot Pain. Podiatry Following.Continue with HEP as tolerated. Continue current medication regimen. Continue to monitor.08/28/2020 12. Left Greater Trochanter Bursitis:No complaints today.Continue to alternate Ice and Heat Therapy. Continue to Monitor.08/28/2020  F/U in 1 month  

## 2020-09-22 NOTE — Progress Notes (Deleted)
Subjective:    Patient ID: Sabrina Williams, female    DOB: 09-21-1970, 50 y.o.   MRN: [id number]  HPI  Pain Inventory Average Pain 6 Pain Right Now 7 My pain is constant, sharp, burning and aching  In the last 24 hours, has pain interfered with the following? General activity 8 Relation with others 7 Enjoyment of life 7 What TIME of day is your pain at its worst? morning  and evening Sleep (in general) Fair  Pain is worse with: walking, inactivity and some activites Pain improves with: rest, heat/ice and medication Relief from Meds: 6  Family History  Problem Relation Age of Onset  . Cancer Mother        multiple myeloma  . Diabetes Father   . Kidney disease Father   . Heart disease Father    Social History   Socioeconomic History  . Marital status: Married    Spouse name: Not on file  . Number of children: 2  . Years of education: some college  . Highest education level: Not on file  Occupational History  . Occupation: homemaker  Tobacco Use  . Smoking status: Never Smoker  . Smokeless tobacco: Never Used  Vaping Use  . Vaping Use: Never used  Substance and Sexual Activity  . Alcohol use: No    Alcohol/week: 0.0 standard drinks  . Drug use: No  . Sexual activity: Not on file  Other Topics Concern  . Not on file  Social History Narrative   Lives at home with husband.   Right-handed.   No daily caffeine use.   Social Determinants of Health   Financial Resource Strain: Not on file  Food Insecurity: Not on file  Transportation Needs: Not on file  Physical Activity: Not on file  Stress: Not on file  Social Connections: Not on file   Past Surgical History:  Procedure Laterality Date  . CERVICAL SPINE SURGERY     C6-7 fusion  . CESAREAN SECTION     x2  . dental implant  July 2015  . ENDOSCOPIC PLANTAR FASCIOTOMY Right August 2015  . GASTRIC BYPASS    . HAND SURGERY Right   . LITHOTRIPSY     multiple  . PLANTAR FASCIECTOMY Right   . SHOULDER  SURGERY Left 10/2004  . SMALL INTESTINE SURGERY     cervical laminectomy  . utereroscopy  1995   Past Surgical History:  Procedure Laterality Date  . CERVICAL SPINE SURGERY     C6-7 fusion  . CESAREAN SECTION     x2  . dental implant  July 2015  . ENDOSCOPIC PLANTAR FASCIOTOMY Right August 2015  . GASTRIC BYPASS    . HAND SURGERY Right   . LITHOTRIPSY     multiple  . PLANTAR FASCIECTOMY Right   . SHOULDER SURGERY Left 10/2004  . SMALL INTESTINE SURGERY     cervical laminectomy  . utereroscopy  1995   Past Medical History:  Diagnosis Date  . Anxiety   . Chronic kidney disease    stones  . Chronic pain   . Complex regional pain syndrome I    right foot  . Depression   . Diabetes mellitus   . Hypertension   . Kidney stone   . Neuromuscular disorder ([place])   . Tremor    There were no vitals taken for this visit.  Opioid Risk Score:   Fall Risk Score:  `1  Depression screen PHQ 2/9  Depression screen [facility] 2/9 07/31/2020  07/02/2020 05/05/2020 04/03/2020 01/04/2020 12/04/2019 10/25/2019  Decreased Interest 0 0 0 0 0 0 0  Down, Depressed, Hopeless 0 0 - 0 0 0 0  PHQ - 2 Score 0 0 0 0 0 0 0  Altered sleeping - - - - - - -  Tired, decreased energy - - - - - - -  Change in appetite - - - - - - -  Feeling bad or failure about yourself  - - - - - - -  Trouble concentrating - - - - - - -  Moving slowly or fidgety/restless - - - - - - -  Suicidal thoughts - - - - - - -  PHQ-9 Score - - - - - - -  Some recent data might be hidden    Review of Systems  Musculoskeletal: Positive for back pain, gait problem and neck pain.       Pain in left shoulder, ankle, leg  All other systems reviewed and are negative.      Objective:   Physical Exam        Assessment & Plan:

## 2020-09-23 ENCOUNTER — Encounter: Payer: 59 | Admitting: Registered Nurse

## 2020-09-29 ENCOUNTER — Encounter: Payer: 59 | Attending: Physical Medicine & Rehabilitation | Admitting: Registered Nurse

## 2020-09-29 ENCOUNTER — Encounter: Payer: Self-pay | Admitting: Registered Nurse

## 2020-09-29 ENCOUNTER — Other Ambulatory Visit: Payer: Self-pay

## 2020-09-29 VITALS — BP 125/80 | HR 89 | Temp 98.1°F | Ht 67.0 in | Wt 276.8 lb

## 2020-09-29 DIAGNOSIS — M25512 Pain in left shoulder: Secondary | ICD-10-CM | POA: Insufficient documentation

## 2020-09-29 DIAGNOSIS — M545 Low back pain, unspecified: Secondary | ICD-10-CM | POA: Diagnosis present

## 2020-09-29 DIAGNOSIS — G8929 Other chronic pain: Secondary | ICD-10-CM | POA: Insufficient documentation

## 2020-09-29 DIAGNOSIS — M5412 Radiculopathy, cervical region: Secondary | ICD-10-CM | POA: Diagnosis present

## 2020-09-29 DIAGNOSIS — M79672 Pain in left foot: Secondary | ICD-10-CM | POA: Diagnosis present

## 2020-09-29 DIAGNOSIS — M79671 Pain in right foot: Secondary | ICD-10-CM | POA: Insufficient documentation

## 2020-09-29 DIAGNOSIS — M542 Cervicalgia: Secondary | ICD-10-CM | POA: Insufficient documentation

## 2020-09-29 DIAGNOSIS — G90521 Complex regional pain syndrome I of right lower limb: Secondary | ICD-10-CM | POA: Insufficient documentation

## 2020-09-29 DIAGNOSIS — G629 Polyneuropathy, unspecified: Secondary | ICD-10-CM | POA: Diagnosis present

## 2020-09-29 MED ORDER — NUCYNTA 100 MG PO TABS: ORAL_TABLET | ORAL | 0 refills | Status: DC

## 2020-09-29 NOTE — Progress Notes (Signed)
Subjective:    Patient ID: Sabrina Williams, female    DOB: November 28, 1970, 50 y.o.   MRN: [id number]  HPI: Sabrina Williams is a 50 y.o. female who returns for follow up appointment for chronic pain and medication refill. She states her pain is located in her neck radiating into her left shoulder, upper- mid back and lower back pain. Also reports left hand and bilateral feet with numbness and tingling. She rates her pain 2. Her current exercise regime is walking and performing stretching exercises.  Ms. Mijangos Morphine equivalent is 144.00 MME.  Last UDS was Performed on 07/31/2020, it was consistent.    Pain Inventory Average Pain 4 Pain Right Now 2 My pain is constant, sharp, burning, dull, stabbing, tingling and aching  In the last 24 hours, has pain interfered with the following? General activity 7 Relation with others 5 Enjoyment of life 5 What TIME of day is your pain at its worst? evening Sleep (in general) Fair  Pain is worse with: walking, bending, standing and some activites Pain improves with: rest, heat/ice, therapy/exercise and medication Relief from Meds: 6  Family History  Problem Relation Age of Onset  . Cancer Mother        multiple myeloma  . Diabetes Father   . Kidney disease Father   . Heart disease Father    Social History   Socioeconomic History  . Marital status: Married    Spouse name: Not on file  . Number of children: 2  . Years of education: some college  . Highest education level: Not on file  Occupational History  . Occupation: homemaker  Tobacco Use  . Smoking status: Never Smoker  . Smokeless tobacco: Never Used  Vaping Use  . Vaping Use: Never used  Substance and Sexual Activity  . Alcohol use: No    Alcohol/week: 0.0 standard drinks  . Drug use: No  . Sexual activity: Not on file  Other Topics Concern  . Not on file  Social History Narrative   Lives at home with husband.   Right-handed.   No daily caffeine use.   Social  Determinants of Health   Financial Resource Strain: Not on file  Food Insecurity: Not on file  Transportation Needs: Not on file  Physical Activity: Not on file  Stress: Not on file  Social Connections: Not on file   Past Surgical History:  Procedure Laterality Date  . CERVICAL SPINE SURGERY     C6-7 fusion  . CESAREAN SECTION     x2  . dental implant  July 2015  . ENDOSCOPIC PLANTAR FASCIOTOMY Right August 2015  . GASTRIC BYPASS    . HAND SURGERY Right   . LITHOTRIPSY     multiple  . PLANTAR FASCIECTOMY Right   . SHOULDER SURGERY Left 10/2004  . SMALL INTESTINE SURGERY     cervical laminectomy  . utereroscopy  1995   Past Surgical History:  Procedure Laterality Date  . CERVICAL SPINE SURGERY     C6-7 fusion  . CESAREAN SECTION     x2  . dental implant  July 2015  . ENDOSCOPIC PLANTAR FASCIOTOMY Right August 2015  . GASTRIC BYPASS    . HAND SURGERY Right   . LITHOTRIPSY     multiple  . PLANTAR FASCIECTOMY Right   . SHOULDER SURGERY Left 10/2004  . SMALL INTESTINE SURGERY     cervical laminectomy  . utereroscopy  1995   Past Medical History:  Diagnosis Date  .  Anxiety   . Chronic kidney disease    stones  . Chronic pain   . Complex regional pain syndrome I    right foot  . Depression   . Diabetes mellitus   . Hypertension   . Kidney stone   . Neuromuscular disorder ([place])   . Tremor    There were no vitals taken for this visit.  Opioid Risk Score:   Fall Risk Score:  `1  Depression screen PHQ 2/9  Depression screen [facility] 2/9 07/31/2020 07/02/2020 05/05/2020 04/03/2020 01/04/2020 12/04/2019 10/25/2019  Decreased Interest 0 0 0 0 0 0 0  Down, Depressed, Hopeless 0 0 - 0 0 0 0  PHQ - 2 Score 0 0 0 0 0 0 0  Altered sleeping - - - - - - -  Tired, decreased energy - - - - - - -  Change in appetite - - - - - - -  Feeling bad or failure about yourself  - - - - - - -  Trouble concentrating - - - - - - -  Moving slowly or fidgety/restless - - - - - - -   Suicidal thoughts - - - - - - -  PHQ-9 Score - - - - - - -  Some recent data might be hidden   Review of Systems  Musculoskeletal: Positive for back pain, gait problem and neck pain.       Pain both heels and up back of feet Left shoulder pain Right hand pain  All other systems reviewed and are negative.      Objective:   Physical Exam Vitals and nursing note reviewed.  Constitutional:      Appearance: Normal appearance. She is obese.  Neck:     Comments: Cervical Paraspinal Tenderness: C- Musculoskeletal:     Cervical back: Normal range of motion and neck supple.  Neurological:     Mental Status: She is alert.           Assessment & Plan:  1.Complex regional pain syndrome right lower extremity postoperative. She's s/p post plantar fascial release. She continue withsensitivity totouch. She has diffuse numbness and tingling.ContinueGabapentin.09/29/2020. Refilled: Nucynta 100 mg one capsules every 6 hours as needed for pain#120. We will continue the opioid monitoring program, this consists of regular clinic visits, examinations, urine drug screen, pill counts as well as use of [place] Controlled Substance Reporting system. A 12 month History has been reviewed on the [place] Controlled Substance Reporting Systemon05/01/2021. 2.Myofascial pain syndrome:Continuecurrent medication regime withTizanidine and ice, heat and exercise regime.09/29/2020 3. Depression: Continue: Zoloft.PCP Following.09/29/2020 4. Cervical Post Laminectomy: Continue to Monitor.09/29/2020 5.Cervicalgia/Cervical Radiculopathy/Leftshoulder, Left Arm and LeftHand Painwith tingling, Continuecurrent medication regime withGabapentin05/01/2021 6. PolyNeuropathy:ContinueGabapentin:Continue to Monitor.09/29/2020 7.Left Shoulder Tendonitis:ChronicLeftShoulder Pain:OrthoFollowing.Continue current medication regime and Continue to Monitor.09/29/2020 8. Right  hand pain/ Post Surgical Procedure:Trigger Finger Release: Dr. Jerilee Hoh Following. No complaints Today.09/29/2020. 9. Tremor:No complaints today. Continue withGabapentin decreased to 600 mg five times a day.04/07//2022 10.LeftKnee Pain: Continue with HEP as Tolerated. Continue to Monitor.08/28/2020 11.Chronic LeftFoot Pain. Podiatry Following.Continue with HEP as tolerated. Continue current medication regimen. Continue to monitor.09/29/2020 12. Left Greater Trochanter Bursitis:No complaints today.Continue to alternate Ice and Heat Therapy. Continue to Monitor.09/29/2020  F/U in 1 month

## 2020-10-01 ENCOUNTER — Encounter: Payer: Self-pay | Admitting: Registered Nurse

## 2020-10-28 ENCOUNTER — Encounter: Payer: 59 | Admitting: Registered Nurse

## 2020-10-28 ENCOUNTER — Ambulatory Visit: Payer: 59 | Admitting: Registered Nurse

## 2020-11-03 ENCOUNTER — Encounter: Payer: 59 | Attending: Physical Medicine & Rehabilitation | Admitting: Registered Nurse

## 2020-11-03 ENCOUNTER — Other Ambulatory Visit: Payer: Self-pay

## 2020-11-03 VITALS — BP 126/85 | HR 87 | Temp 98.1°F | Ht 67.0 in | Wt 274.8 lb

## 2020-11-03 DIAGNOSIS — Z5181 Encounter for therapeutic drug level monitoring: Secondary | ICD-10-CM | POA: Insufficient documentation

## 2020-11-03 DIAGNOSIS — G894 Chronic pain syndrome: Secondary | ICD-10-CM | POA: Diagnosis present

## 2020-11-03 DIAGNOSIS — M79671 Pain in right foot: Secondary | ICD-10-CM | POA: Insufficient documentation

## 2020-11-03 DIAGNOSIS — M5412 Radiculopathy, cervical region: Secondary | ICD-10-CM | POA: Diagnosis present

## 2020-11-03 DIAGNOSIS — G629 Polyneuropathy, unspecified: Secondary | ICD-10-CM | POA: Diagnosis present

## 2020-11-03 DIAGNOSIS — Z79891 Long term (current) use of opiate analgesic: Secondary | ICD-10-CM | POA: Insufficient documentation

## 2020-11-03 DIAGNOSIS — M542 Cervicalgia: Secondary | ICD-10-CM | POA: Diagnosis present

## 2020-11-03 DIAGNOSIS — M25561 Pain in right knee: Secondary | ICD-10-CM | POA: Diagnosis not present

## 2020-11-03 DIAGNOSIS — M79672 Pain in left foot: Secondary | ICD-10-CM | POA: Diagnosis present

## 2020-11-03 DIAGNOSIS — G8929 Other chronic pain: Secondary | ICD-10-CM | POA: Diagnosis present

## 2020-11-03 MED ORDER — GABAPENTIN 600 MG PO TABS: 600.0000 mg | ORAL_TABLET | Freq: Every day | ORAL | 3 refills | Status: DC

## 2020-11-03 MED ORDER — NUCYNTA 100 MG PO TABS: ORAL_TABLET | ORAL | 0 refills | Status: DC

## 2020-11-03 MED ORDER — TIZANIDINE HCL 4 MG PO TABS: 4.0000 mg | ORAL_TABLET | Freq: Every day | ORAL | 5 refills | Status: DC | PRN

## 2020-11-03 NOTE — Progress Notes (Signed)
Subjective:    Patient ID: Sabrina Williams, female    DOB: 09/20/1970, 50 y.o.   MRN: [id number]  HPI: Sabrina Williams is a 50 y.o. female who returns for follow up appointment for chronic pain and medication refill. She states her pain is located in her neck radiating into her left shoulder, right knee and bilateral feet with tingling. Sabrina Williams reports on Saturday she was stepping of her porch and twist her right foot, she denies falling. She states she has an appointment with [facility] today. She rates her pain 3. Her current exercise regime is walking.  Sabrina Williams Morphine equivalent is 144.00 MME.   Last UDS was Performed on 07/31/2020, it was consistent.    Pain Inventory Average Pain 5 Pain Right Now 3 My pain is constant, sharp, burning, dull, stabbing, tingling, and aching  In the last 24 hours, has pain interfered with the following? General activity 9 Relation with others 5 Enjoyment of life 8 What TIME of day is your pain at its worst? evening Sleep (in general) Fair  Pain is worse with: walking, bending, standing, and some activites Pain improves with: rest, heat/ice, and medication Relief from Meds: 6  Family History  Problem Relation Age of Onset   Cancer Mother        multiple myeloma   Diabetes Father    Kidney disease Father    Heart disease Father    Social History   Socioeconomic History   Marital status: Married    Spouse name: Not on file   Number of children: 2   Years of education: some college   Highest education level: Not on file  Occupational History   Occupation: homemaker  Tobacco Use   Smoking status: Never   Smokeless tobacco: Never  Vaping Use   Vaping Use: Never used  Substance and Sexual Activity   Alcohol use: No    Alcohol/week: 0.0 standard drinks   Drug use: No   Sexual activity: Not on file  Other Topics Concern   Not on file  Social History Narrative   Lives at home with husband.   Right-handed.   No daily  caffeine use.   Social Determinants of Health   Financial Resource Strain: Not on file  Food Insecurity: Not on file  Transportation Needs: Not on file  Physical Activity: Not on file  Stress: Not on file  Social Connections: Not on file   Past Surgical History:  Procedure Laterality Date   CERVICAL SPINE SURGERY     C6-7 fusion   CESAREAN SECTION     x2   dental implant  July 2015   ENDOSCOPIC PLANTAR FASCIOTOMY Right August 2015   GASTRIC BYPASS     HAND SURGERY Right    LITHOTRIPSY     multiple   PLANTAR FASCIECTOMY Right    SHOULDER SURGERY Left 10/2004   SMALL INTESTINE SURGERY     cervical laminectomy   utereroscopy  1995   Past Surgical History:  Procedure Laterality Date   CERVICAL SPINE SURGERY     C6-7 fusion   CESAREAN SECTION     x2   dental implant  July 2015   ENDOSCOPIC PLANTAR FASCIOTOMY Right August 2015   GASTRIC BYPASS     HAND SURGERY Right    LITHOTRIPSY     multiple   PLANTAR FASCIECTOMY Right    SHOULDER SURGERY Left 10/2004   SMALL INTESTINE SURGERY     cervical laminectomy  utereroscopy  1995   Past Medical History:  Diagnosis Date   Anxiety    Chronic kidney disease    stones   Chronic pain    Complex regional pain syndrome I    right foot   Depression    Diabetes mellitus    Hypertension    Kidney stone    Neuromuscular disorder (HCC)    Tremor    Temp 98.1 F (36.7 C) (Oral)   Ht _0  (1.702 m)   Wt 274 lb 12.8 oz (124.6 kg)   BMI 43.04 kg/m   Opioid Risk Score:   Fall Risk Score:  `1  Depression screen PHQ 2/9  Depression screen [facility] 2/9 09/29/2020 07/31/2020 07/02/2020 05/05/2020 04/03/2020 01/04/2020 12/04/2019  Decreased Interest 0 0 0 0 0 0 0  Down, Depressed, Hopeless 0 0 0 - 0 0 0  PHQ - 2 Score 0 0 0 0 0 0 0  Altered sleeping - - - - - - -  Tired, decreased energy - - - - - - -  Change in appetite - - - - - - -  Feeling bad or failure about yourself  - - - - - - -  Trouble concentrating - - - - - - -   Moving slowly or fidgety/restless - - - - - - -  Suicidal thoughts - - - - - - -  PHQ-9 Score - - - - - - -  Some recent data might be hidden     Review of Systems  Musculoskeletal:        Arm pain Knee pain Feet pain  All other systems reviewed and are negative.     Objective:   Physical Exam Vitals and nursing note reviewed.  Constitutional:      Appearance: Normal appearance.  Cardiovascular:     Rate and Rhythm: Normal rate and regular rhythm.     Pulses: Normal pulses.     Heart sounds: Normal heart sounds.  Pulmonary:     Effort: Pulmonary effort is normal.     Breath sounds: Normal breath sounds.  Musculoskeletal:     Cervical back: Normal range of motion and neck supple.     Comments: Normal Muscle Bulk and Muscle Testing Reveals:  Upper Extremities: Full ROM and Muscle Strength 5/5 Lower Extremities: Right: Decreased ROM and Muscle Strength 5/5 Right Lower Extremity Flexion Produces Pain into her Right Patella Left Lower Extremity: Full ROM and Muscle Strength 5/5 Arises from Table Slowly Antalgic  Gait     Skin:    General: Skin is warm and dry.  Neurological:     Mental Status: She is alert and oriented to person, place, and time.  Psychiatric:        Mood and Affect: Mood normal.        Behavior: Behavior normal.         Assessment & Plan:  1.Complex regional pain syndrome right lower extremity postoperative. She's s/p post plantar fascial release. She continue with sensitivity to touch. She has diffuse numbness and tingling. Continue Gabapentin. 11/03/2020. Refilled: Nucynta 100 mg one capsules every 6 hours as needed for pain #120. We will continue the opioid monitoring program, this consists of regular clinic visits, examinations, urine drug screen, pill counts as well as use of [place] Controlled Substance Reporting system. A 12 month History has been reviewed on the [place] Controlled Substance Reporting System on  11/03/2020. 2.Myofascial pain syndrome: Continue current medication regime with Tizanidine  and  ice, heat and exercise regime. 11/03/2020 3. Depression: Continue: Zoloft. PCP Following. 11/03/2020 4. Cervical Post Laminectomy: Continue to Monitor. 11/03/2020 5. Cervicalgia/Cervical  Radiculopathy/ Left shoulder, Left Arm and Left  Hand Pain with tingling,  Continue current medication regime with  Gabapentin 11/03/2020 6. Poly Neuropathy: Continue Gabapentin:Continue to Monitor. 11/03/2020  7. Left Shoulder Tendonitis: Chronic Left Shoulder Pain:  Ortho Following. Continue current medication regime and Continue  to Monitor. 11/03/2020  8. Right hand pain/ Post Surgical  Procedure: Trigger Finger Release: Dr. Jerilee Hoh Following.  No complaints Today. 11/03/2020. 9. Tremor: No complaints today. Continue with Gabapentin decreased to 600 mg five times a day. 06/13//2022 10. Right  Knee Pain:  Has an appointment with Orthopedics today. Continue with HEP as Tolerated. Continue to Monitor. 06/13/ 2022 11. Chronic Left  Foot Pain. Podiatry Following.Continue with HEP as tolerated. Continue current medication regimen. Continue to monitor. 11/03/2020  12. Left Greater Trochanter Bursitis: No complaints today. Continue to alternate Ice and Heat Therapy. Continue to Monitor. 11/03/2020     F/U in 1 month

## 2020-11-05 ENCOUNTER — Encounter: Payer: Self-pay | Admitting: Registered Nurse

## 2020-11-17 ENCOUNTER — Telehealth: Payer: Self-pay | Admitting: Registered Nurse

## 2020-11-17 MED ORDER — PREDNISONE 10 MG (21) PO TBPK: ORAL_TABLET | ORAL | 0 refills | Status: DC

## 2020-11-17 NOTE — Telephone Encounter (Signed)
Received message from Ms. Eide requesting steroid pak, due to Right knee pain and tendonitis. Medication list reviewed, Steroid Pak ordered today. Placed a call to Ms. Rudie regarding the above, she verbalizes understanding.

## 2020-11-26 ENCOUNTER — Encounter: Payer: Self-pay | Admitting: Registered Nurse

## 2020-11-26 ENCOUNTER — Other Ambulatory Visit: Payer: Self-pay

## 2020-11-26 ENCOUNTER — Encounter: Payer: 59 | Attending: Physical Medicine & Rehabilitation | Admitting: Registered Nurse

## 2020-11-26 VITALS — BP 130/84 | HR 75 | Temp 97.7°F | Ht 67.0 in | Wt 274.8 lb

## 2020-11-26 DIAGNOSIS — M7918 Myalgia, other site: Secondary | ICD-10-CM | POA: Diagnosis present

## 2020-11-26 DIAGNOSIS — G90521 Complex regional pain syndrome I of right lower limb: Secondary | ICD-10-CM | POA: Diagnosis present

## 2020-11-26 DIAGNOSIS — M1711 Unilateral primary osteoarthritis, right knee: Secondary | ICD-10-CM | POA: Diagnosis present

## 2020-11-26 DIAGNOSIS — M79671 Pain in right foot: Secondary | ICD-10-CM | POA: Diagnosis present

## 2020-11-26 DIAGNOSIS — Z79891 Long term (current) use of opiate analgesic: Secondary | ICD-10-CM | POA: Insufficient documentation

## 2020-11-26 DIAGNOSIS — G8929 Other chronic pain: Secondary | ICD-10-CM | POA: Insufficient documentation

## 2020-11-26 DIAGNOSIS — G629 Polyneuropathy, unspecified: Secondary | ICD-10-CM | POA: Insufficient documentation

## 2020-11-26 DIAGNOSIS — M5412 Radiculopathy, cervical region: Secondary | ICD-10-CM | POA: Diagnosis present

## 2020-11-26 DIAGNOSIS — M79672 Pain in left foot: Secondary | ICD-10-CM | POA: Diagnosis present

## 2020-11-26 DIAGNOSIS — Z5181 Encounter for therapeutic drug level monitoring: Secondary | ICD-10-CM | POA: Diagnosis present

## 2020-11-26 DIAGNOSIS — M542 Cervicalgia: Secondary | ICD-10-CM | POA: Insufficient documentation

## 2020-11-26 DIAGNOSIS — M545 Low back pain, unspecified: Secondary | ICD-10-CM | POA: Insufficient documentation

## 2020-11-26 DIAGNOSIS — G894 Chronic pain syndrome: Secondary | ICD-10-CM | POA: Insufficient documentation

## 2020-11-26 MED ORDER — NUCYNTA 100 MG PO TABS: ORAL_TABLET | ORAL | 0 refills | Status: DC

## 2020-11-26 NOTE — Progress Notes (Signed)
Subjective:    Patient ID: Sabrina Williams, female    DOB: June 17, 1970, 50 y.o.   MRN: [id number]  HPI: ADELY FACER is a 50 y.o. female who returns for follow up appointment for chronic pain and medication refill. She states her pain is located in  her neck radiating into her left shoulder, lower back pain, right knee and bilateral feet with tingling and burning. She rates her pain 3. Her current exercise regime is walking and performing stretching exercises.   Ms. Tasso Morphine equivalent is 160.00  MME.   Last UDS was Performed on 07/31/2020, it was consistent.    Pain Inventory Average Pain 4 Pain Right Now 3 My pain is constant, sharp, burning, dull, stabbing, and tingling  In the last 24 hours, has pain interfered with the following? General activity 7 Relation with others 4 Enjoyment of life 4 What TIME of day is your pain at its worst? evening Sleep (in general) Fair  Pain is worse with: walking, standing, and some activites Pain improves with: rest, heat/ice, and medication Relief from Meds: 7  Family History  Problem Relation Age of Onset   Cancer Mother        multiple myeloma   Diabetes Father    Kidney disease Father    Heart disease Father    Social History   Socioeconomic History   Marital status: Married    Spouse name: Not on file   Number of children: 2   Years of education: some college   Highest education level: Not on file  Occupational History   Occupation: homemaker  Tobacco Use   Smoking status: Never   Smokeless tobacco: Never  Vaping Use   Vaping Use: Never used  Substance and Sexual Activity   Alcohol use: No    Alcohol/week: 0.0 standard drinks   Drug use: No   Sexual activity: Not on file  Other Topics Concern   Not on file  Social History Narrative   Lives at home with husband.   Right-handed.   No daily caffeine use.   Social Determinants of Health   Financial Resource Strain: Not on file  Food Insecurity: Not on  file  Transportation Needs: Not on file  Physical Activity: Not on file  Stress: Not on file  Social Connections: Not on file   Past Surgical History:  Procedure Laterality Date   CERVICAL SPINE SURGERY     C6-7 fusion   CESAREAN SECTION     x2   dental implant  July 2015   ENDOSCOPIC PLANTAR FASCIOTOMY Right August 2015   GASTRIC BYPASS     HAND SURGERY Right    LITHOTRIPSY     multiple   PLANTAR FASCIECTOMY Right    SHOULDER SURGERY Left 10/2004   SMALL INTESTINE SURGERY     cervical laminectomy   utereroscopy  1995   Past Surgical History:  Procedure Laterality Date   CERVICAL SPINE SURGERY     C6-7 fusion   CESAREAN SECTION     x2   dental implant  July 2015   ENDOSCOPIC PLANTAR FASCIOTOMY Right August 2015   GASTRIC BYPASS     HAND SURGERY Right    LITHOTRIPSY     multiple   PLANTAR FASCIECTOMY Right    SHOULDER SURGERY Left 10/2004   SMALL INTESTINE SURGERY     cervical laminectomy   utereroscopy  1995   Past Medical History:  Diagnosis Date   Anxiety    Chronic kidney  disease    stones   Chronic pain    Complex regional pain syndrome I    right foot   Depression    Diabetes mellitus    Hypertension    Kidney stone    Neuromuscular disorder (HCC)    Tremor    BP 130/84   Pulse 75   Temp 97.7 F (36.5 C)   Ht $R'5\' 7"'fz$  (1.702 m)   Wt 274 lb 12.8 oz (124.6 kg)   SpO2 99%   BMI 43.04 kg/m   Opioid Risk Score:   Fall Risk Score:  `1  Depression screen PHQ 2/9  Depression screen [facility] 2/9 09/29/2020 07/31/2020 07/02/2020 05/05/2020 04/03/2020 01/04/2020 12/04/2019  Decreased Interest 0 0 0 0 0 0 0  Down, Depressed, Hopeless 0 0 0 - 0 0 0  PHQ - 2 Score 0 0 0 0 0 0 0  Altered sleeping - - - - - - -  Tired, decreased energy - - - - - - -  Change in appetite - - - - - - -  Feeling bad or failure about yourself  - - - - - - -  Trouble concentrating - - - - - - -  Moving slowly or fidgety/restless - - - - - - -  Suicidal thoughts - - - - - - -  PHQ-9  Score - - - - - - -  Some recent data might be hidden    Review of Systems  Musculoskeletal:  Positive for back pain, gait problem and neck pain.       Pain in left arm, hand, wrist  Pain in the feet & ankles & Right Knee      Objective:   Physical Exam Vitals and nursing note reviewed.  Constitutional:      Appearance: Normal appearance.  Cardiovascular:     Rate and Rhythm: Normal rate and regular rhythm.     Pulses: Normal pulses.     Heart sounds: Normal heart sounds.  Pulmonary:     Effort: Pulmonary effort is normal.     Breath sounds: Normal breath sounds.  Musculoskeletal:     Cervical back: Normal range of motion and neck supple.     Comments: Normal Muscle Bulk and Muscle Testing Reveals:  Upper Extremities: Full ROM and Muscle Strength 5/5 Thoracic Paraspinal Tenderness: T-1-T-3 Lower Extremities: Full ROM and Muscle Strength 5/5 Arises from Table with ease Narrow Based  Gait     Skin:    General: Skin is warm and dry.  Neurological:     General: No focal deficit present.     Mental Status: She is alert and oriented to person, place, and time.  Psychiatric:        Mood and Affect: Mood normal.        Behavior: Behavior normal.         Assessment & Plan:  1.Complex regional pain syndrome right lower extremity postoperative. She's s/p post plantar fascial release. She continue with sensitivity to touch. She has diffuse numbness and tingling. Continue Gabapentin. 11/26/2020. Refilled: Nucynta 100 mg one capsules every 6 hours as needed for pain #120. We will continue the opioid monitoring program, this consists of regular clinic visits, examinations, urine drug screen, pill counts as well as use of [place] Controlled Substance Reporting system. A 12 month History has been reviewed on the [place] Controlled Substance Reporting System on 11/26/2020. 2.Myofascial pain syndrome: Continue current medication regime with Tizanidine and  ice,  heat and  exercise regime. 11/26/2020 3. Depression: Continue: Zoloft. PCP Following. 11/26/2020 4. Cervical Post Laminectomy: Continue to Monitor. 11/26/2020 5. Cervicalgia/Cervical  Radiculopathy/ Left shoulder, Left Arm and Left  Hand Pain with tingling,  Continue current medication regime with  Gabapentin 11/26/2020 6. Poly Neuropathy: Continue Gabapentin:Continue to Monitor. 11/26/2020  7. Left Shoulder Tendonitis: Chronic Left Shoulder Pain:  Ortho Following. Continue current medication regime and Continue  to Monitor. 11/26/2020  8. Right hand pain/ Post Surgical  Procedure: Trigger Finger Release: Dr. Jerilee Hoh Following.  No complaints Today. 11/26/2020. 9. Tremor: No complaints today. Continue with Gabapentin decreased to 600 mg five times a day. 07/06//2022 10. Right  Knee Pain: Ortho Following: Continue with HEP as Tolerated. Continue to Monitor. 07/06/ 2022 11. Chronic Left  Foot Pain. Podiatry Following.Continue with HEP as tolerated. Continue current medication regimen. Continue to monitor. 11/26/2020  12. Left Greater Trochanter Bursitis: No complaints today. Continue to alternate Ice and Heat Therapy. Continue to Monitor. 11/26/2020     F/U in 1 month

## 2020-12-29 ENCOUNTER — Encounter: Payer: 59 | Admitting: Registered Nurse

## 2020-12-31 ENCOUNTER — Encounter: Payer: 59 | Attending: Physical Medicine & Rehabilitation | Admitting: Registered Nurse

## 2020-12-31 ENCOUNTER — Other Ambulatory Visit: Payer: Self-pay

## 2020-12-31 ENCOUNTER — Encounter: Payer: Self-pay | Admitting: Registered Nurse

## 2020-12-31 VITALS — BP 126/87 | HR 100 | Ht 67.0 in | Wt 268.6 lb

## 2020-12-31 DIAGNOSIS — G90521 Complex regional pain syndrome I of right lower limb: Secondary | ICD-10-CM | POA: Insufficient documentation

## 2020-12-31 DIAGNOSIS — G894 Chronic pain syndrome: Secondary | ICD-10-CM | POA: Insufficient documentation

## 2020-12-31 DIAGNOSIS — G8929 Other chronic pain: Secondary | ICD-10-CM | POA: Diagnosis present

## 2020-12-31 DIAGNOSIS — M79671 Pain in right foot: Secondary | ICD-10-CM | POA: Diagnosis present

## 2020-12-31 DIAGNOSIS — M1711 Unilateral primary osteoarthritis, right knee: Secondary | ICD-10-CM | POA: Insufficient documentation

## 2020-12-31 DIAGNOSIS — Z5181 Encounter for therapeutic drug level monitoring: Secondary | ICD-10-CM | POA: Diagnosis present

## 2020-12-31 DIAGNOSIS — G629 Polyneuropathy, unspecified: Secondary | ICD-10-CM | POA: Diagnosis present

## 2020-12-31 DIAGNOSIS — M5412 Radiculopathy, cervical region: Secondary | ICD-10-CM | POA: Insufficient documentation

## 2020-12-31 DIAGNOSIS — M545 Low back pain, unspecified: Secondary | ICD-10-CM | POA: Diagnosis present

## 2020-12-31 DIAGNOSIS — M79672 Pain in left foot: Secondary | ICD-10-CM | POA: Insufficient documentation

## 2020-12-31 DIAGNOSIS — M542 Cervicalgia: Secondary | ICD-10-CM | POA: Diagnosis present

## 2020-12-31 MED ORDER — NUCYNTA 100 MG PO TABS: ORAL_TABLET | ORAL | 0 refills | Status: DC

## 2020-12-31 NOTE — Progress Notes (Signed)
Subjective:    Patient ID: Sabrina Williams, female    DOB: 1971/05/14, 50 y.o.   MRN: [id number]  HPI: Sabrina Williams is a 50 y.o. female who returns for follow up appointment for chronic pain and medication refill. She states her pain is located in her  neck radiating into her left shoulder, mid- back,right knee and bilateral feet pain R>L with long periods of walking and standing. She rates her pain 3. Her current exercise regime is walking and performing stretching exercises.  Sabrina Williams equivalent is 165.33 MME.     Last UDS was Performed on 07/31/2020, it was consistent.      Pain Inventory Average Pain 4 Pain Right Now 3 My pain is constant, sharp, burning, dull, stabbing, tingling, and aching  In the last 24 hours, has pain interfered with the following? General activity 6 Relation with others 3 Enjoyment of life 3 What TIME of day is your pain at its worst? morning , daytime, evening, and night Sleep (in general) Poor  Pain is worse with: walking, standing, and some activites Pain improves with: rest and medication Relief from Meds: 5  Family History  Problem Relation Age of Onset   Cancer Mother        multiple myeloma   Diabetes Father    Kidney disease Father    Heart disease Father    Social History   Socioeconomic History   Marital status: Married    Spouse name: Not on file   Number of children: 2   Years of education: some college   Highest education level: Not on file  Occupational History   Occupation: homemaker  Tobacco Use   Smoking status: Never   Smokeless tobacco: Never  Vaping Use   Vaping Use: Never used  Substance and Sexual Activity   Alcohol use: No    Alcohol/week: 0.0 standard drinks   Drug use: No   Sexual activity: Not on file  Other Topics Concern   Not on file  Social History Narrative   Lives at home with husband.   Right-handed.   No daily caffeine use.   Social Determinants of Health   Financial  Resource Strain: Not on file  Food Insecurity: Not on file  Transportation Needs: Not on file  Physical Activity: Not on file  Stress: Not on file  Social Connections: Not on file   Past Surgical History:  Procedure Laterality Date   CERVICAL SPINE SURGERY     C6-7 fusion   CESAREAN SECTION     x2   dental implant  July 2015   ENDOSCOPIC PLANTAR FASCIOTOMY Right August 2015   GASTRIC BYPASS     HAND SURGERY Right    LITHOTRIPSY     multiple   PLANTAR FASCIECTOMY Right    SHOULDER SURGERY Left 10/2004   SMALL INTESTINE SURGERY     cervical laminectomy   utereroscopy  1995   Past Surgical History:  Procedure Laterality Date   CERVICAL SPINE SURGERY     C6-7 fusion   CESAREAN SECTION     x2   dental implant  July 2015   ENDOSCOPIC PLANTAR FASCIOTOMY Right August 2015   GASTRIC BYPASS     HAND SURGERY Right    LITHOTRIPSY     multiple   PLANTAR FASCIECTOMY Right    SHOULDER SURGERY Left 10/2004   SMALL INTESTINE SURGERY     cervical laminectomy   utereroscopy  1995   Past Medical History:  Diagnosis Date   Anxiety    Chronic kidney disease    stones   Chronic pain    Complex regional pain syndrome I    right foot   Depression    Diabetes mellitus    Hypertension    Kidney stone    Neuromuscular disorder (HCC)    Tremor    BP 126/87   Pulse (!) 104   Ht 5' 7" (1.702 m)   Wt 268 lb 9.6 oz (121.8 kg)   BMI 42.07 kg/m   Opioid Risk Score:   Fall Risk Score:  `1  Depression screen PHQ 2/9  Depression screen PHQ 2/9 11/26/2020 09/29/2020 07/31/2020 07/02/2020 05/05/2020 04/03/2020 01/04/2020  Decreased Interest 0 0 0 0 0 0 0  Down, Depressed, Hopeless 0 0 0 0 - 0 0  PHQ - 2 Score 0 0 0 0 0 0 0  Altered sleeping - - - - - - -  Tired, decreased energy - - - - - - -  Change in appetite - - - - - - -  Feeling bad or failure about yourself  - - - - - - -  Trouble concentrating - - - - - - -  Moving slowly or fidgety/restless - - - - - - -  Suicidal thoughts -  - - - - - -  PHQ-9 Score - - - - - - -  Some recent data might be hidden    Review of Systems  Musculoskeletal:  Positive for back pain, gait problem and neck pain.       Pain in left shoulder, left arm & hand Left knee Both feet  All other systems reviewed and are negative.     Objective:   Physical Exam Vitals and nursing note reviewed.  Constitutional:      Appearance: Normal appearance.  Cardiovascular:     Rate and Rhythm: Normal rate and regular rhythm.     Pulses: Normal pulses.     Heart sounds: Normal heart sounds.  Pulmonary:     Effort: Pulmonary effort is normal.     Breath sounds: Normal breath sounds.  Musculoskeletal:     Cervical back: Normal range of motion and neck supple.     Comments: Normal Muscle Bulk and Muscle Testing Reveals:  Upper Extremities: Full ROM and Muscle Strength 5/5 Right AC Joint Tenderness  Thoracic Paraspinal Tenderness: T-7-T-9 Lower Extremities: Full ROM and Muscle Strength 5/5 Arises from table with ease  Narrow Based Gait     Skin:    General: Skin is warm and dry.  Neurological:     Mental Status: She is alert and oriented to person, place, and time.  Psychiatric:        Mood and Affect: Mood normal.        Behavior: Behavior normal.         Assessment & Plan:  1.Complex regional pain syndrome right lower extremity postoperative. She's s/p post plantar fascial release. She continue with sensitivity to touch. She has diffuse numbness and tingling. Continue Gabapentin. 12/31/2020. Refilled: Nucynta 100 mg one capsules every 6 hours as needed for pain #120. We will continue the opioid monitoring program, this consists of regular clinic visits, examinations, urine drug screen, pill counts as well as use of [place] Controlled Substance Reporting system. A 12 month History has been reviewed on the [place] Controlled Substance Reporting System on 12/31/2020. 2.Myofascial pain syndrome: Continue current medication  regime with Tizanidine and  ice,   heat and exercise regime. 12/31/2020 3. Depression: Continue: Zoloft. PCP Following. 12/31/2020 4. Cervical Post Laminectomy: Continue to Monitor. 12/31/2020 5. Cervicalgia/Cervical  Radiculopathy/ Left shoulder, Left Arm and Left  Hand Pain with tingling,  Continue current medication regime with  Gabapentin 12/31/2020 6. Poly Neuropathy: Continue Gabapentin:Continue to Monitor. 12/31/2020 7. Left Shoulder Tendonitis: Chronic Left Shoulder Pain:  Ortho Following. Continue current medication regime and Continue  to Monitor. 12/31/2020 8. Right hand pain/ Post Surgical  Procedure: Trigger Finger Release: Dr. Jerilee Hoh Following.  No complaints Today. 12/31/2020. 9. Tremor: No complaints today. Continue with Gabapentin decreased to 600 mg five times a day. 08/10//2022 10. Right  Knee Pain: Orthopedics Following: Continue with HEP as Tolerated. Continue to Monitor. 08/10/ 2022 11. Chronic Left  Foot Pain. Podiatry Following.Continue with HEP as tolerated. Continue current medication regimen. Continue to monitor. 12/31/2020  12. Left Greater Trochanter Bursitis: No complaints today. Continue to alternate Ice and Heat Therapy. Continue to Monitor. 12/31/2020     F/U in 1 month

## 2021-01-27 ENCOUNTER — Other Ambulatory Visit: Payer: Self-pay | Admitting: Registered Nurse

## 2021-01-27 ENCOUNTER — Encounter: Payer: Self-pay | Admitting: Registered Nurse

## 2021-01-27 ENCOUNTER — Other Ambulatory Visit: Payer: Self-pay

## 2021-01-27 ENCOUNTER — Encounter: Payer: 59 | Attending: Physical Medicine & Rehabilitation | Admitting: Registered Nurse

## 2021-01-27 VITALS — BP 92/67 | HR 101 | Temp 98.2°F | Ht 67.0 in | Wt 268.4 lb

## 2021-01-27 DIAGNOSIS — M542 Cervicalgia: Secondary | ICD-10-CM | POA: Insufficient documentation

## 2021-01-27 DIAGNOSIS — M5412 Radiculopathy, cervical region: Secondary | ICD-10-CM | POA: Diagnosis present

## 2021-01-27 DIAGNOSIS — G894 Chronic pain syndrome: Secondary | ICD-10-CM | POA: Diagnosis present

## 2021-01-27 DIAGNOSIS — G90521 Complex regional pain syndrome I of right lower limb: Secondary | ICD-10-CM | POA: Diagnosis present

## 2021-01-27 DIAGNOSIS — M79671 Pain in right foot: Secondary | ICD-10-CM | POA: Insufficient documentation

## 2021-01-27 DIAGNOSIS — G629 Polyneuropathy, unspecified: Secondary | ICD-10-CM | POA: Insufficient documentation

## 2021-01-27 DIAGNOSIS — Z79891 Long term (current) use of opiate analgesic: Secondary | ICD-10-CM | POA: Insufficient documentation

## 2021-01-27 DIAGNOSIS — M79672 Pain in left foot: Secondary | ICD-10-CM | POA: Insufficient documentation

## 2021-01-27 DIAGNOSIS — Z5181 Encounter for therapeutic drug level monitoring: Secondary | ICD-10-CM | POA: Diagnosis present

## 2021-01-27 DIAGNOSIS — G8929 Other chronic pain: Secondary | ICD-10-CM | POA: Insufficient documentation

## 2021-01-27 MED ORDER — NUCYNTA 100 MG PO TABS: ORAL_TABLET | ORAL | 0 refills | Status: DC

## 2021-01-27 NOTE — Progress Notes (Signed)
Subjective:    Patient ID: Sabrina Williams, female    DOB: Feb 04, 1971, 50 y.o.   MRN: [id number]  HPI: Sabrina Williams is a 50 y.o. female who returns for follow up appointment for chronic pain and medication refill. She states her pain is located in her neck radiaiting into her bilateral shoulders and bilateral feet pain with tingling and numbness L>R. She rates her pain 4. Her current exercise regime is walking and performing stretching exercises.  Sabrina Williams Morphine equivalent is 160.00 MME.   UDS ordered today.     Pain Inventory Average Pain 5 Pain Right Now 4 My pain is constant, sharp, burning, stabbing, and aching  In the last 24 hours, has pain interfered with the following? General activity 8 Relation with others 6 Enjoyment of life 7 What TIME of day is your pain at its worst? morning  Sleep (in general) Fair  Pain is worse with: walking, standing, and some activites Pain improves with: rest, heat/ice, therapy/exercise, and medication Relief from Meds: 5  Family History  Problem Relation Age of Onset   Cancer Mother        multiple myeloma   Diabetes Father    Kidney disease Father    Heart disease Father    Social History   Socioeconomic History   Marital status: Married    Spouse name: Not on file   Number of children: 2   Years of education: some college   Highest education level: Not on file  Occupational History   Occupation: homemaker  Tobacco Use   Smoking status: Never   Smokeless tobacco: Never  Vaping Use   Vaping Use: Never used  Substance and Sexual Activity   Alcohol use: No    Alcohol/week: 0.0 standard drinks   Drug use: No   Sexual activity: Not on file  Other Topics Concern   Not on file  Social History Narrative   Lives at home with husband.   Right-handed.   No daily caffeine use.   Social Determinants of Health   Financial Resource Strain: Not on file  Food Insecurity: Not on file  Transportation Needs: Not on file   Physical Activity: Not on file  Stress: Not on file  Social Connections: Not on file   Past Surgical History:  Procedure Laterality Date   CERVICAL SPINE SURGERY     C6-7 fusion   CESAREAN SECTION     x2   dental implant  July 2015   ENDOSCOPIC PLANTAR FASCIOTOMY Right August 2015   GASTRIC BYPASS     HAND SURGERY Right    LITHOTRIPSY     multiple   PLANTAR FASCIECTOMY Right    SHOULDER SURGERY Left 10/2004   SMALL INTESTINE SURGERY     cervical laminectomy   utereroscopy  1995   Past Surgical History:  Procedure Laterality Date   CERVICAL SPINE SURGERY     C6-7 fusion   CESAREAN SECTION     x2   dental implant  July 2015   ENDOSCOPIC PLANTAR FASCIOTOMY Right August 2015   GASTRIC BYPASS     HAND SURGERY Right    LITHOTRIPSY     multiple   PLANTAR FASCIECTOMY Right    SHOULDER SURGERY Left 10/2004   SMALL INTESTINE SURGERY     cervical laminectomy   utereroscopy  1995   Past Medical History:  Diagnosis Date   Anxiety    Chronic kidney disease    stones   Chronic pain  Complex regional pain syndrome I    right foot   Depression    Diabetes mellitus    Hypertension    Kidney stone    Neuromuscular disorder (HCC)    Tremor    BP 92/67   Pulse (!) 101   Temp 98.2 F (36.8 C) (Oral)   Ht $R'5\' 7"'Su$  (1.702 m)   Wt 268 lb 6.4 oz (121.7 kg)   SpO2 96%   BMI 42.04 kg/m   Opioid Risk Score:   Fall Risk Score:  `1  Depression screen PHQ 2/9  Depression screen [facility] 2/9 12/31/2020 11/26/2020 09/29/2020 07/31/2020 07/02/2020 05/05/2020 04/03/2020  Decreased Interest 0 0 0 0 0 0 0  Down, Depressed, Hopeless 0 0 0 0 0 - 0  PHQ - 2 Score 0 0 0 0 0 0 0  Altered sleeping - - - - - - -  Tired, decreased energy - - - - - - -  Change in appetite - - - - - - -  Feeling bad or failure about yourself  - - - - - - -  Trouble concentrating - - - - - - -  Moving slowly or fidgety/restless - - - - - - -  Suicidal thoughts - - - - - - -  PHQ-9 Score - - - - - - -  Some  recent data might be hidden     Review of Systems  Musculoskeletal:  Positive for neck pain.       Knee pain Ankle pain Arm pain  All other systems reviewed and are negative.     Objective:   Physical Exam Vitals and nursing note reviewed.  Constitutional:      Appearance: Normal appearance.  Cardiovascular:     Rate and Rhythm: Normal rate and regular rhythm.     Pulses: Normal pulses.     Heart sounds: Normal heart sounds.  Pulmonary:     Effort: Pulmonary effort is normal.     Breath sounds: Normal breath sounds.  Musculoskeletal:     Cervical back: Normal range of motion and neck supple.     Comments: Normal Muscle Bulk and Muscle Testing Reveals:  Upper Extremities: Full ROM and Muscle Strength 5/5 Bilateral AC Joint Tenderness Lower Extremities: Full ROM and Muscle Strength 5/5 Arises from Table with ease Narrow Based  Gait     Skin:    General: Skin is warm and dry.  Neurological:     Mental Status: She is alert and oriented to person, place, and time.  Psychiatric:        Mood and Affect: Mood normal.        Behavior: Behavior normal.         Assessment & Plan:  1.Complex regional pain syndrome right lower extremity postoperative. She's s/p post plantar fascial release. She continue with sensitivity to touch. She has diffuse numbness and tingling. Continue Gabapentin. 01/27/2021. Refilled: Nucynta 100 mg one capsules every 6 hours as needed for pain #120. We will continue the opioid monitoring program, this consists of regular clinic visits, examinations, urine drug screen, pill counts as well as use of [place] Controlled Substance Reporting system. A 12 month History has been reviewed on the [place] Controlled Substance Reporting System on 01/27/2021. 2.Myofascial pain syndrome: Continue current medication regime with Tizanidine and  ice, heat and exercise regime. 01/27/2021 3. Depression: Continue: Zoloft. PCP Following. 01/27/2021 4. Cervical  Post Laminectomy: Continue to Monitor. 01/27/2021 5. Cervicalgia/Cervical  Radiculopathy/ Left  shoulder, Left Arm and Left  Hand Pain with tingling,  Continue current medication regime with  Gabapentin 01/27/2021 6. Poly Neuropathy: Continue Gabapentin:Continue to Monitor. 01/27/2021 7. Left Shoulder Tendonitis: Chronic Left Shoulder Pain:  Ortho Following. Continue current medication regime and Continue  to Monitor. 01/27/2021 8. Right hand pain/ Post Surgical  Procedure: Trigger Finger Release: Dr. Jerilee Hoh Following.  No complaints Today. 01/27/2021. 9. Tremor: No complaints today. Continue with Gabapentin decreased to 600 mg five times a day. 09/06//2022 10. Right  Knee Pain: Orthopedics Following: Continue with HEP as Tolerated. Continue to Monitor. 09/06/ 2022 11. Chronic Bilateral  Feet Pain. Podiatry Following.Continue with HEP as tolerated. Continue current medication regimen. Continue to monitor. 01/27/2021  12. Left Greater Trochanter Bursitis: No complaints today. Continue to alternate Ice and Heat Therapy. Continue to Monitor. 01/27/2021     F/U in 1 month

## 2021-01-31 LAB — TOXASSURE SELECT,+ANTIDEPR,UR

## 2021-02-03 ENCOUNTER — Telehealth: Payer: Self-pay | Admitting: *Deleted

## 2021-02-03 NOTE — Telephone Encounter (Signed)
Urine drug screen for this encounter is consistent for prescribed medication 

## 2021-02-23 ENCOUNTER — Other Ambulatory Visit: Payer: Self-pay

## 2021-02-23 ENCOUNTER — Encounter: Payer: 59 | Attending: Physical Medicine & Rehabilitation | Admitting: Registered Nurse

## 2021-02-23 VITALS — BP 102/71 | HR 85 | Temp 98.0°F | Ht 67.0 in | Wt 265.4 lb

## 2021-02-23 DIAGNOSIS — M1711 Unilateral primary osteoarthritis, right knee: Secondary | ICD-10-CM | POA: Insufficient documentation

## 2021-02-23 DIAGNOSIS — G90521 Complex regional pain syndrome I of right lower limb: Secondary | ICD-10-CM | POA: Insufficient documentation

## 2021-02-23 DIAGNOSIS — Z5181 Encounter for therapeutic drug level monitoring: Secondary | ICD-10-CM | POA: Insufficient documentation

## 2021-02-23 DIAGNOSIS — Z79891 Long term (current) use of opiate analgesic: Secondary | ICD-10-CM | POA: Insufficient documentation

## 2021-02-23 DIAGNOSIS — M542 Cervicalgia: Secondary | ICD-10-CM | POA: Diagnosis present

## 2021-02-23 DIAGNOSIS — G8929 Other chronic pain: Secondary | ICD-10-CM | POA: Diagnosis present

## 2021-02-23 DIAGNOSIS — M79671 Pain in right foot: Secondary | ICD-10-CM | POA: Insufficient documentation

## 2021-02-23 DIAGNOSIS — M1712 Unilateral primary osteoarthritis, left knee: Secondary | ICD-10-CM | POA: Diagnosis present

## 2021-02-23 DIAGNOSIS — M79672 Pain in left foot: Secondary | ICD-10-CM | POA: Diagnosis present

## 2021-02-23 DIAGNOSIS — G629 Polyneuropathy, unspecified: Secondary | ICD-10-CM | POA: Insufficient documentation

## 2021-02-23 DIAGNOSIS — M5412 Radiculopathy, cervical region: Secondary | ICD-10-CM | POA: Diagnosis not present

## 2021-02-23 DIAGNOSIS — G894 Chronic pain syndrome: Secondary | ICD-10-CM | POA: Insufficient documentation

## 2021-02-23 MED ORDER — NUCYNTA 100 MG PO TABS: ORAL_TABLET | ORAL | 0 refills | Status: DC

## 2021-02-23 NOTE — Progress Notes (Signed)
Subjective:    Patient ID: Sabrina Williams, female    DOB: September 18, 1970, 50 y.o.   MRN: [id number]  HPI: Sabrina Williams is a 50 y.o. female who returns for follow up appointment for chronic pain and medication refill. She states her pain is located in her  neck radiating into her left shoulder and left upper extremity with tingling and numbness. She also reports bilateral knee pain an bilateral feet pain with tingling and numbness. She rates her pain 3. Her current exercise regime is walking and performing stretching exercises.  Sabrina Williams Morphine equivalent is 160.00 MME.      Last UDS was Performed on 01/27/2021, it was consistent.   Pain Inventory Average Pain 5 Pain Right Now 3 My pain is sharp, burning, dull, stabbing, tingling, and aching  In the last 24 hours, has pain interfered with the following? General activity 6 Relation with others 5 Enjoyment of life 5 What TIME of day is your pain at its worst? morning  and evening Sleep (in general) Fair  Pain is worse with: walking, bending, sitting, standing, and some activites Pain improves with: rest, heat/ice, therapy/exercise, and medication Relief from Meds: 6  Family History  Problem Relation Age of Onset   Cancer Mother        multiple myeloma   Diabetes Father    Kidney disease Father    Heart disease Father    Social History   Socioeconomic History   Marital status: Married    Spouse name: Not on file   Number of children: 2   Years of education: some college   Highest education level: Not on file  Occupational History   Occupation: homemaker  Tobacco Use   Smoking status: Never   Smokeless tobacco: Never  Vaping Use   Vaping Use: Never used  Substance and Sexual Activity   Alcohol use: No    Alcohol/week: 0.0 standard drinks   Drug use: No   Sexual activity: Not on file  Other Topics Concern   Not on file  Social History Narrative   Lives at home with husband.   Right-handed.   No daily  caffeine use.   Social Determinants of Health   Financial Resource Strain: Not on file  Food Insecurity: Not on file  Transportation Needs: Not on file  Physical Activity: Not on file  Stress: Not on file  Social Connections: Not on file   Past Surgical History:  Procedure Laterality Date   CERVICAL SPINE SURGERY     C6-7 fusion   CESAREAN SECTION     x2   dental implant  July 2015   ENDOSCOPIC PLANTAR FASCIOTOMY Right August 2015   GASTRIC BYPASS     HAND SURGERY Right    LITHOTRIPSY     multiple   PLANTAR FASCIECTOMY Right    SHOULDER SURGERY Left 10/2004   SMALL INTESTINE SURGERY     cervical laminectomy   utereroscopy  1995   Past Surgical History:  Procedure Laterality Date   CERVICAL SPINE SURGERY     C6-7 fusion   CESAREAN SECTION     x2   dental implant  July 2015   ENDOSCOPIC PLANTAR FASCIOTOMY Right August 2015   GASTRIC BYPASS     HAND SURGERY Right    LITHOTRIPSY     multiple   PLANTAR FASCIECTOMY Right    SHOULDER SURGERY Left 10/2004   SMALL INTESTINE SURGERY     cervical laminectomy   utereroscopy  1995  Past Medical History:  Diagnosis Date   Anxiety    Chronic kidney disease    stones   Chronic pain    Complex regional pain syndrome I    right foot   Depression    Diabetes mellitus    Hypertension    Kidney stone    Neuromuscular disorder (HCC)    Tremor    BP 102/71   Pulse 85   Temp 98 F (36.7 C) (Oral)   Ht $R'5\' 7"'pC$  (1.702 m)   Wt 265 lb 6.4 oz (120.4 kg)   SpO2 99%   BMI 41.57 kg/m   Opioid Risk Score:   Fall Risk Score:  `1  Depression screen PHQ 2/9  Depression screen [facility] 2/9 12/31/2020 11/26/2020 09/29/2020 07/31/2020 07/02/2020 05/05/2020 04/03/2020  Decreased Interest 0 0 0 0 0 0 0  Down, Depressed, Hopeless 0 0 0 0 0 - 0  PHQ - 2 Score 0 0 0 0 0 0 0  Altered sleeping - - - - - - -  Tired, decreased energy - - - - - - -  Change in appetite - - - - - - -  Feeling bad or failure about yourself  - - - - - - -  Trouble  concentrating - - - - - - -  Moving slowly or fidgety/restless - - - - - - -  Suicidal thoughts - - - - - - -  PHQ-9 Score - - - - - - -  Some recent data might be hidden     Review of Systems  Musculoskeletal:  Positive for back pain, gait problem and neck pain.       Left shoulder, arm, hand and knee pain Bilateral foot pain  All other systems reviewed and are negative.     Objective:   Physical Exam Vitals and nursing note reviewed.  Constitutional:      Appearance: Normal appearance. She is obese.  Cardiovascular:     Rate and Rhythm: Normal rate and regular rhythm.     Pulses: Normal pulses.     Heart sounds: Normal heart sounds.  Pulmonary:     Effort: Pulmonary effort is normal.     Breath sounds: Normal breath sounds.  Musculoskeletal:     Cervical back: Normal range of motion and neck supple.     Comments: Normal Muscle Bulk and Muscle Testing Reveals:  Upper Extremities: Full ROM and Muscle Strength 5/5 Lower Extremities Full ROM and Muscle Strength 5/5 Arises from Table with ease Narrow Based Gait     Skin:    General: Skin is warm and dry.  Neurological:     Mental Status: She is alert and oriented to person, place, and time.  Psychiatric:        Mood and Affect: Mood normal.        Behavior: Behavior normal.         Assessment & Plan:  1.Complex regional pain syndrome right lower extremity postoperative. She's s/p post plantar fascial release. She continue with sensitivity to touch. She has diffuse numbness and tingling. Continue Gabapentin. 02/23/2021. Refilled: Nucynta 100 mg one capsules every 6 hours as needed for pain #120. We will continue the opioid monitoring program, this consists of regular clinic visits, examinations, urine drug screen, pill counts as well as use of [place] Controlled Substance Reporting system. A 12 month History has been reviewed on the [place] Controlled Substance Reporting System on 02/23/2021. 2.Myofascial  pain syndrome: Continue current  medication regime with Tizanidine and  ice, heat and exercise regime. 02/23/2021 3. Depression: Continue: Zoloft. PCP Following. 02/23/2021 4. Cervical Post Laminectomy: Continue to Monitor. 02/23/2021 5. Cervicalgia/Cervical  Radiculopathy/ Left shoulder, Left Arm and Left  Hand Pain with tingling,  Continue current medication regime with  Gabapentin 02/23/2021 6. Poly Neuropathy: Continue Gabapentin:Continue to Monitor. 02/23/2021 7. Left Shoulder Tendonitis: Chronic Left Shoulder Pain:  Ortho Following. Continue current medication regime and Continue  to Monitor. 02/23/2021 8. Right hand pain/ Post Surgical  Procedure: Trigger Finger Release: Dr. Jerilee Hoh Following.  No complaints Today. 02/23/2021. 9. Tremor: No complaints today. Continue with Gabapentin decreased to 600 mg five times a day. 10/03//2022 10. Bilateral  Knee Pain: Orthopedics Following: Continue with HEP as Tolerated. Continue to Monitor. 10/03/ 2022 11. Chronic Bilateral  Feet Pain. Podiatry Following.Continue with HEP as tolerated. Continue current medication regimen. Continue to monitor. 02/23/2021  12. Left Greater Trochanter Bursitis: No complaints today. Continue to alternate Ice and Heat Therapy. Continue to Monitor. 02/23/2021     F/U in 1 month

## 2021-02-24 ENCOUNTER — Encounter: Payer: Self-pay | Admitting: Registered Nurse

## 2021-03-02 ENCOUNTER — Ambulatory Visit: Payer: 59 | Admitting: Registered Nurse

## 2021-03-26 ENCOUNTER — Encounter: Payer: 59 | Attending: Physical Medicine & Rehabilitation | Admitting: Registered Nurse

## 2021-03-26 ENCOUNTER — Other Ambulatory Visit: Payer: Self-pay

## 2021-03-26 ENCOUNTER — Encounter: Payer: Self-pay | Admitting: Registered Nurse

## 2021-03-26 VITALS — BP 105/73 | HR 83 | Ht 67.0 in | Wt 268.6 lb

## 2021-03-26 DIAGNOSIS — Z5181 Encounter for therapeutic drug level monitoring: Secondary | ICD-10-CM | POA: Diagnosis present

## 2021-03-26 DIAGNOSIS — M542 Cervicalgia: Secondary | ICD-10-CM | POA: Insufficient documentation

## 2021-03-26 DIAGNOSIS — G8929 Other chronic pain: Secondary | ICD-10-CM | POA: Insufficient documentation

## 2021-03-26 DIAGNOSIS — G894 Chronic pain syndrome: Secondary | ICD-10-CM | POA: Diagnosis present

## 2021-03-26 DIAGNOSIS — M79672 Pain in left foot: Secondary | ICD-10-CM | POA: Insufficient documentation

## 2021-03-26 DIAGNOSIS — M1711 Unilateral primary osteoarthritis, right knee: Secondary | ICD-10-CM | POA: Insufficient documentation

## 2021-03-26 DIAGNOSIS — M79671 Pain in right foot: Secondary | ICD-10-CM | POA: Diagnosis present

## 2021-03-26 DIAGNOSIS — M1712 Unilateral primary osteoarthritis, left knee: Secondary | ICD-10-CM | POA: Insufficient documentation

## 2021-03-26 DIAGNOSIS — M5412 Radiculopathy, cervical region: Secondary | ICD-10-CM | POA: Diagnosis present

## 2021-03-26 DIAGNOSIS — G90521 Complex regional pain syndrome I of right lower limb: Secondary | ICD-10-CM | POA: Insufficient documentation

## 2021-03-26 DIAGNOSIS — G629 Polyneuropathy, unspecified: Secondary | ICD-10-CM | POA: Diagnosis present

## 2021-03-26 DIAGNOSIS — Z79891 Long term (current) use of opiate analgesic: Secondary | ICD-10-CM | POA: Diagnosis present

## 2021-03-26 MED ORDER — NUCYNTA 100 MG PO TABS
ORAL_TABLET | ORAL | 0 refills | Status: DC
Start: 2021-03-26 — End: 2021-05-26

## 2021-03-26 MED ORDER — NUCYNTA 100 MG PO TABS
ORAL_TABLET | ORAL | 0 refills | Status: DC
Start: 2021-03-26 — End: 2021-03-26

## 2021-03-26 MED ORDER — PREDNISONE 10 MG (21) PO TBPK: ORAL_TABLET | ORAL | 0 refills | Status: DC

## 2021-03-26 NOTE — Progress Notes (Signed)
Subjective:    Patient ID: Sabrina Williams, female    DOB: Jul 15, 1970, 50 y.o.   MRN: [id number]  HPI: Sabrina Williams is a 50 y.o. female who returns for follow up appointment for chronic pain and medication refill. She states her pain is located in her neck radiating into her left shoulder, left arm with tingling, she also reports bilateral knee pain R>L and bilateral feet pain with burning. [name] reports increase intensity of pain, we will prescribe Steroid Pak, she verbalizes understanding. She rates her pain 4. Her current exercise regime is walking and performing stretching exercises.  [name] Morphine equivalent is 160.00 MME.   Last UDS was Performed on 01/27/2021, it was consistent.     Pain Inventory Average Pain 6 Pain Right Now 4 My pain is constant, sharp, burning, dull, and stabbing  In the last 24 hours, has pain interfered with the following? General activity 9 Relation with others 5 Enjoyment of life 9 What TIME of day is your pain at its worst? evening Sleep (in general) Fair  Pain is worse with: walking, standing, and some activites Pain improves with: rest, heat/ice, and medication Relief from Meds: 7  Family History  Problem Relation Age of Onset   Cancer Mother        multiple myeloma   Diabetes Father    Kidney disease Father    Heart disease Father    Social History   Socioeconomic History   Marital status: Married    Spouse name: Not on file   Number of children: 2   Years of education: some college   Highest education level: Not on file  Occupational History   Occupation: homemaker  Tobacco Use   Smoking status: Never   Smokeless tobacco: Never  Vaping Use   Vaping Use: Never used  Substance and Sexual Activity   Alcohol use: No    Alcohol/week: 0.0 standard drinks   Drug use: No   Sexual activity: Not on file  Other Topics Concern   Not on file  Social History Narrative   Lives at home with husband.   Right-handed.    No daily caffeine use.   Social Determinants of Health   Financial Resource Strain: Not on file  Food Insecurity: Not on file  Transportation Needs: Not on file  Physical Activity: Not on file  Stress: Not on file  Social Connections: Not on file   Past Surgical History:  Procedure Laterality Date   CERVICAL SPINE SURGERY     C6-7 fusion   CESAREAN SECTION     x2   dental implant  July 2015   ENDOSCOPIC PLANTAR FASCIOTOMY Right August 2015   GASTRIC BYPASS     HAND SURGERY Right    LITHOTRIPSY     multiple   PLANTAR FASCIECTOMY Right    SHOULDER SURGERY Left 10/2004   SMALL INTESTINE SURGERY     cervical laminectomy   utereroscopy  1995   Past Surgical History:  Procedure Laterality Date   CERVICAL SPINE SURGERY     C6-7 fusion   CESAREAN SECTION     x2   dental implant  July 2015   ENDOSCOPIC PLANTAR FASCIOTOMY Right August 2015   GASTRIC BYPASS     HAND SURGERY Right    LITHOTRIPSY     multiple   PLANTAR FASCIECTOMY Right    SHOULDER SURGERY Left 10/2004   SMALL INTESTINE SURGERY     cervical laminectomy   utereroscopy  1995   Past Medical History:  Diagnosis Date   Anxiety    Chronic kidney disease    stones   Chronic pain    Complex regional pain syndrome I    right foot   Depression    Diabetes mellitus    Hypertension    Kidney stone    Neuromuscular disorder (HCC)    Tremor    BP 105/73   Pulse 83   Ht $R'5\' 7"'Sv$  (1.702 m)   Wt 268 lb 9.6 oz (121.8 kg)   SpO2 99%   BMI 42.07 kg/m   Opioid Risk Score:   Fall Risk Score:  `1  Depression screen PHQ 2/9  Depression screen [facility] 2/9 02/23/2021 12/31/2020 11/26/2020 09/29/2020 07/31/2020 07/02/2020 05/05/2020  Decreased Interest 0 0 0 0 0 0 0  Down, Depressed, Hopeless 0 0 0 0 0 0 -  PHQ - 2 Score 0 0 0 0 0 0 0  Altered sleeping - - - - - - -  Tired, decreased energy - - - - - - -  Change in appetite - - - - - - -  Feeling bad or failure about yourself  - - - - - - -  Trouble concentrating - - - - -  - -  Moving slowly or fidgety/restless - - - - - - -  Suicidal thoughts - - - - - - -  PHQ-9 Score - - - - - - -  Some recent data might be hidden     Review of Systems  Musculoskeletal:  Positive for back pain, gait problem and neck pain.       Left shoulder, arm, hand, knee pain Bilateral foot pain  All other systems reviewed and are negative.     Objective:   Physical Exam Vitals and nursing note reviewed.  Constitutional:      Appearance: Normal appearance. She is obese.  Cardiovascular:     Rate and Rhythm: Normal rate and regular rhythm.     Pulses: Normal pulses.     Heart sounds: Normal heart sounds.  Pulmonary:     Effort: Pulmonary effort is normal.     Breath sounds: Normal breath sounds.  Musculoskeletal:     Cervical back: Normal range of motion and neck supple.     Comments: Normal Muscle Bulk and Muscle Testing Reveals:  Upper Extremities:Full  ROM and Muscle Strength 5/5 Left AC Joint Tenderness  Lumbar Paraspinal Tenderness: L-3-L-5 Lower Extremities: Full ROM and Muscle Strength 5/5 Arises from Table Slowly Narrow Based  Gait     Skin:    General: Skin is warm and dry.  Neurological:     Mental Status: She is alert and oriented to person, place, and time.  Psychiatric:        Mood and Affect: Mood normal.        Behavior: Behavior normal.         Assessment & Plan:  1.Complex regional pain syndrome right lower extremity postoperative. She's s/p post plantar fascial release. She continue with sensitivity to touch. She has diffuse numbness and tingling. Continue Gabapentin. 03/26/2021. Refilled: Nucynta 100 mg one capsules every 6 hours as needed for pain #120.Second script sent for the following month to accommodate scheduled appointment. We will continue the opioid monitoring program, this consists of regular clinic visits, examinations, urine drug screen, pill counts as well as use of [place] Controlled Substance Reporting system. A 12  month History has been reviewed on the  [place] Controlled Substance Reporting System on 03/26/2021. 2.Myofascial pain syndrome: Continue current medication regime with Tizanidine and  ice, heat and exercise regime. 03/26/2021 3. Depression: Continue: Zoloft. PCP Following. 03/26/2021 4. Cervical Post Laminectomy: Continue to Monitor. 03/26/2021 5. Cervicalgia/Cervical  Radiculopathy/ Left shoulder, Left Arm and Left  Hand Pain with tingling,  Continue current medication regime with  Gabapentin 03/26/2021 6. Poly Neuropathy: Continue Gabapentin:Continue to Monitor. 03/26/2021 7. Left Shoulder Tendonitis: Chronic Left Shoulder Pain:  Ortho Following. Continue current medication regime and Continue  to Monitor. 03/26/2021 8. Right hand pain/ Post Surgical  Procedure: Trigger Finger Release: Dr. Jerilee Hoh Following.  No complaints Today. 03/26/2021. 9. Tremor: No complaints today. Continue with Gabapentin decreased to 600 mg five times a day. 11/03//2022 10. Bilateral  Knee Pain: Orthopedics Following: Continue with HEP as Tolerated. Continue to Monitor. 11/03/ 2022 11. Chronic Bilateral  Feet Pain. Podiatry Following.Continue with HEP as tolerated. Continue current medication regimen. Continue to monitor. 03/26/2021  12. Left Greater Trochanter Bursitis: No complaints today. Continue to alternate Ice and Heat Therapy. Continue to Monitor. 03/26/2021   13. Chronic Pain: RX Steroid Pak. Continue to Monitor.    F/U in 1 month

## 2021-03-30 ENCOUNTER — Ambulatory Visit: Payer: 59 | Admitting: Registered Nurse

## 2021-05-19 ENCOUNTER — Encounter: Payer: 59 | Admitting: Registered Nurse

## 2021-05-26 ENCOUNTER — Encounter: Payer: Self-pay | Admitting: Registered Nurse

## 2021-05-26 ENCOUNTER — Other Ambulatory Visit: Payer: Self-pay

## 2021-05-26 ENCOUNTER — Encounter: Payer: 59 | Attending: Physical Medicine & Rehabilitation | Admitting: Registered Nurse

## 2021-05-26 VITALS — BP 103/73 | HR 83 | Temp 97.7°F | Ht 67.0 in | Wt 269.0 lb

## 2021-05-26 DIAGNOSIS — Z5181 Encounter for therapeutic drug level monitoring: Secondary | ICD-10-CM | POA: Insufficient documentation

## 2021-05-26 DIAGNOSIS — G629 Polyneuropathy, unspecified: Secondary | ICD-10-CM | POA: Diagnosis present

## 2021-05-26 DIAGNOSIS — M79671 Pain in right foot: Secondary | ICD-10-CM | POA: Insufficient documentation

## 2021-05-26 DIAGNOSIS — G894 Chronic pain syndrome: Secondary | ICD-10-CM | POA: Insufficient documentation

## 2021-05-26 DIAGNOSIS — Z79891 Long term (current) use of opiate analgesic: Secondary | ICD-10-CM | POA: Insufficient documentation

## 2021-05-26 DIAGNOSIS — G90521 Complex regional pain syndrome I of right lower limb: Secondary | ICD-10-CM | POA: Diagnosis not present

## 2021-05-26 DIAGNOSIS — M5412 Radiculopathy, cervical region: Secondary | ICD-10-CM | POA: Diagnosis not present

## 2021-05-26 DIAGNOSIS — M542 Cervicalgia: Secondary | ICD-10-CM | POA: Diagnosis not present

## 2021-05-26 DIAGNOSIS — G8929 Other chronic pain: Secondary | ICD-10-CM | POA: Insufficient documentation

## 2021-05-26 DIAGNOSIS — M79672 Pain in left foot: Secondary | ICD-10-CM | POA: Insufficient documentation

## 2021-05-26 MED ORDER — NUCYNTA 100 MG PO TABS: ORAL_TABLET | ORAL | 0 refills | Status: DC

## 2021-05-26 NOTE — Progress Notes (Signed)
Subjective:    Patient ID: Sabrina Williams, female    DOB: [birth year], 51 y.o.   MRN: [id number]  HPI: Sabrina Williams is a 51 y.o. female who returns for follow up appointment for chronic pain and medication refill. She states her pain is located in her neck radiating into her lef shoulder, she describes the pain as a dull ache. Also reports mid- back pain mainly left side.She rates her pain 4.Her current exercise regime is walking and performing stretching exercises.  Sabrina Williams forgot her medication, we reviewed the narcotic policy, she verbalizes understanding.   Sabrina Williams equivalent is 160.00 MME.   Last Oral Swab was Performed on 01/27/2021, it was consistent.    Pain Inventory Average Pain 5 Pain Right Now 4 My pain is constant, burning, dull, stabbing, tingling, and aching  In the last 24 hours, has pain interfered with the following? General activity 7 Relation with others 3 Enjoyment of life 5 What TIME of day is your pain at its worst? evening Sleep (in general) Fair  Pain is worse with: walking, standing, and some activites Pain improves with: rest, heat/ice, therapy/exercise, and medication Relief from Meds: 6  Family History  Problem Relation Age of Onset   Cancer Mother        multiple myeloma   Diabetes Father    Kidney disease Father    Heart disease Father    Social History   Socioeconomic History   Marital status: Married    Spouse name: Not on file   Number of children: 2   Years of education: some college   Highest education level: Not on file  Occupational History   Occupation: homemaker  Tobacco Use   Smoking status: Never   Smokeless tobacco: Never  Vaping Use   Vaping Use: Never used  Substance and Sexual Activity   Alcohol use: No    Alcohol/week: 0.0 standard drinks   Drug use: No   Sexual activity: Not on file  Other Topics Concern   Not on file  Social History Narrative   Lives at home with husband.   Right-handed.    No daily caffeine use.   Social Determinants of Health   Financial Resource Strain: Not on file  Food Insecurity: Not on file  Transportation Needs: Not on file  Physical Activity: Not on file  Stress: Not on file  Social Connections: Not on file   Past Surgical History:  Procedure Laterality Date   CERVICAL SPINE SURGERY     C6-7 fusion   CESAREAN SECTION     x2   dental implant  July 2015   ENDOSCOPIC PLANTAR FASCIOTOMY Right August 2015   GASTRIC BYPASS     HAND SURGERY Right    LITHOTRIPSY     multiple   PLANTAR FASCIECTOMY Right    SHOULDER SURGERY Left 10/2004   SMALL INTESTINE SURGERY     cervical laminectomy   utereroscopy  1995   Past Surgical History:  Procedure Laterality Date   CERVICAL SPINE SURGERY     C6-7 fusion   CESAREAN SECTION     x2   dental implant  July 2015   ENDOSCOPIC PLANTAR FASCIOTOMY Right August 2015   GASTRIC BYPASS     HAND SURGERY Right    LITHOTRIPSY     multiple   PLANTAR FASCIECTOMY Right    SHOULDER SURGERY Left 10/2004   SMALL INTESTINE SURGERY     cervical laminectomy   utereroscopy  1995  Past Medical History:  Diagnosis Date   Anxiety    Chronic kidney disease    stones   Chronic pain    Complex regional pain syndrome I    right foot   Depression    Diabetes mellitus    Hypertension    Kidney stone    Neuromuscular disorder (HCC)    Tremor    BP 103/73    Pulse 83    Temp 97.7 F (36.5 C)    Ht $R'5\' 7"'Ef$  (1.702 m)    Wt 269 lb (122 kg)    SpO2 97%    BMI 42.13 kg/m   Opioid Risk Score:   Fall Risk Score:  `1  Depression screen PHQ 2/9  Depression screen [facility] 2/9 05/26/2021 02/23/2021 12/31/2020 11/26/2020 09/29/2020 07/31/2020 07/02/2020  Decreased Interest 1 0 0 0 0 0 0  Down, Depressed, Hopeless 1 0 0 0 0 0 0  PHQ - 2 Score 2 0 0 0 0 0 0  Altered sleeping - - - - - - -  Tired, decreased energy - - - - - - -  Change in appetite - - - - - - -  Feeling bad or failure about yourself  - - - - - - -  Trouble  concentrating - - - - - - -  Moving slowly or fidgety/restless - - - - - - -  Suicidal thoughts - - - - - - -  PHQ-9 Score - - - - - - -  Some recent data might be hidden    Review of Systems  Musculoskeletal:  Positive for back pain and neck pain.       Left shoulder pain, last arm pain, right knees pain,both heels  All other systems reviewed and are negative.     Objective:   Physical Exam Vitals and nursing note reviewed.  Constitutional:      Appearance: Normal appearance.  Cardiovascular:     Rate and Rhythm: Normal rate and regular rhythm.     Pulses: Normal pulses.     Heart sounds: Normal heart sounds.  Pulmonary:     Effort: Pulmonary effort is normal.     Breath sounds: Normal breath sounds.  Musculoskeletal:     Cervical back: Normal range of motion and neck supple.     Comments: Normal Muscle Bulk and Muscle Testing Reveals:  Upper Extremities: Full ROM and Muscle Strength  5/5 Lower Extremities : Full ROM and Muscle Strength 5/5 Arises from chair with ease Narrow Based Gait     Skin:    General: Skin is warm and dry.  Neurological:     Mental Status: She is alert and oriented to person, place, and time.  Psychiatric:        Mood and Affect: Mood normal.        Behavior: Behavior normal.         Assessment & Plan:  1.Complex regional pain syndrome right lower extremity postoperative. She's s/p post plantar fascial release. She continue with sensitivity to touch. She has diffuse numbness and tingling. Continue Gabapentin. 05/26/2021. Refilled: Nucynta 100 mg one capsules every 6 hours as needed for pain #120.Second script sent for the following month to accommodate scheduled appointment. We will continue the opioid monitoring program, this consists of regular clinic visits, examinations, urine drug screen, pill counts as well as use of [place] Controlled Substance Reporting system. A 12 month History has been reviewed on the [place] Controlled  Substance  Reporting System on 05/26/2021. 2.Myofascial pain syndrome: Continue current medication regime with Tizanidine and  ice, heat and exercise regime. 05/26/2021 3. Depression: Continue: Zoloft. PCP Following. 05/26/2021 4. Cervical Post Laminectomy: Continue to Monitor. 05/26/2021 5. Cervicalgia/Cervical  Radiculopathy/ Left shoulder, Left Arm and Left  Hand Pain with tingling,  Continue current medication regime with  Gabapentin 05/26/2021 6. Poly Neuropathy: Continue Gabapentin:Continue to Monitor. 05/26/2021 7. Left Shoulder Tendonitis: Chronic Left Shoulder Pain:  Ortho Following. Continue current medication regime and Continue  to Monitor. 05/26/2021 8. Right hand pain/ Post Surgical  Procedure: Trigger Finger Release: Dr. Jerilee Hoh Following.  No complaints Today. 05/26/2021. 9. Tremor: No complaints today. Continue with Gabapentin decreased to 600 mg five times a day. 01/03//2023 10. Bilateral  Knee Pain: Orthopedics Following: No Complaints today. Continue with HEP as Tolerated. Continue to Monitor. 01/03/ 2023 11. Chronic Bilateral  Feet Pain. Podiatry Following.Continue with HEP as tolerated. Continue current medication regimen. Continue to monitor. 05/26/2021  12. Left Greater Trochanter Bursitis: No complaints today. Continue to alternate Ice and Heat Therapy. Continue to Monitor. 05/26/2021      F/U in 1 month

## 2021-05-26 NOTE — Progress Notes (Signed)
9

## 2021-05-28 ENCOUNTER — Telehealth: Payer: Self-pay

## 2021-05-28 NOTE — Telephone Encounter (Signed)
Call [name] : to see if she wants to change medication like she has done in the past. If her insurance no longer will cover it, not sure if a PA will help. Can you see if she was able to pick up her Nucynta for January.

## 2021-05-28 NOTE — Telephone Encounter (Signed)
Patient called and stated her Nucynta is no longer covered by insurance. She stated the pharmacy informed her that it will be $1700. She wants to know if there is something that can be done to get it covered. She said the pharmacy told her that the alternative is Tramadol. Please advise

## 2021-05-28 NOTE — Telephone Encounter (Signed)
I spoke with patient and she stated she thinks she has it taken care of. She stated she thinks it is because her insurance will not allow prescriptions at [facility] anymore. She stated she was going to go by there and see what she can find out and if she needs [place], she will call us back.

## 2021-05-29 ENCOUNTER — Telehealth: Payer: Self-pay

## 2021-05-29 DIAGNOSIS — G522 Disorders of vagus nerve: Secondary | ICD-10-CM | POA: Insufficient documentation

## 2021-05-29 NOTE — Telephone Encounter (Signed)
Spoke with patient and she stated she will get the Nucynta today, regardless of price.

## 2021-06-01 ENCOUNTER — Other Ambulatory Visit: Payer: Self-pay | Admitting: Gerontology

## 2021-06-01 DIAGNOSIS — Z1231 Encounter for screening mammogram for malignant neoplasm of breast: Secondary | ICD-10-CM

## 2021-06-24 ENCOUNTER — Encounter: Payer: 59 | Attending: Physical Medicine & Rehabilitation | Admitting: Registered Nurse

## 2021-06-24 ENCOUNTER — Other Ambulatory Visit: Payer: Self-pay

## 2021-06-24 VITALS — BP 105/74 | HR 93 | Ht 67.0 in | Wt 270.0 lb

## 2021-06-24 DIAGNOSIS — M542 Cervicalgia: Secondary | ICD-10-CM | POA: Insufficient documentation

## 2021-06-24 DIAGNOSIS — M5412 Radiculopathy, cervical region: Secondary | ICD-10-CM | POA: Insufficient documentation

## 2021-06-24 DIAGNOSIS — G90521 Complex regional pain syndrome I of right lower limb: Secondary | ICD-10-CM | POA: Insufficient documentation

## 2021-06-24 DIAGNOSIS — M79671 Pain in right foot: Secondary | ICD-10-CM | POA: Diagnosis present

## 2021-06-24 DIAGNOSIS — M1712 Unilateral primary osteoarthritis, left knee: Secondary | ICD-10-CM | POA: Diagnosis present

## 2021-06-24 DIAGNOSIS — Z79891 Long term (current) use of opiate analgesic: Secondary | ICD-10-CM | POA: Insufficient documentation

## 2021-06-24 DIAGNOSIS — G629 Polyneuropathy, unspecified: Secondary | ICD-10-CM | POA: Diagnosis present

## 2021-06-24 DIAGNOSIS — Z5181 Encounter for therapeutic drug level monitoring: Secondary | ICD-10-CM | POA: Insufficient documentation

## 2021-06-24 DIAGNOSIS — G894 Chronic pain syndrome: Secondary | ICD-10-CM | POA: Diagnosis not present

## 2021-06-24 DIAGNOSIS — M1711 Unilateral primary osteoarthritis, right knee: Secondary | ICD-10-CM | POA: Insufficient documentation

## 2021-06-24 DIAGNOSIS — G8929 Other chronic pain: Secondary | ICD-10-CM | POA: Insufficient documentation

## 2021-06-24 DIAGNOSIS — M79672 Pain in left foot: Secondary | ICD-10-CM | POA: Insufficient documentation

## 2021-06-24 MED ORDER — NUCYNTA 100 MG PO TABS: ORAL_TABLET | ORAL | 0 refills | Status: DC

## 2021-06-24 NOTE — Progress Notes (Signed)
Subjective:    Patient ID: Sabrina Williams, female    DOB: 12/28/1970, 51 y.o.   MRN: [id number]  HPI: Sabrina Williams is a 51 y.o. female who returns for follow up appointment for chronic pain and medication refill. She states her pain is located in her neck radiating into her left shoulder, upper- mid back mainly left side and bilateral knee pain R>L. Also reports bilateral feet pain with tingling and burning R>L. She rates her pain 4. Her current exercise regime is walking and performing stretching exercises. an Sabrina Williams reports last week as she was walking she missed a step, she was able to brace herself. Since then she has noticed increased right foot pain  and she has an appointment with Ortho today, she states.  Sabrina Williams Morphine equivalent is 160.00 MME.   UDS ordered today.     Pain Inventory Average Pain 6 Pain Right Now 4 My pain is intermittent, constant, sharp, burning, dull, stabbing, tingling, and aching  In the last 24 hours, has pain interfered with the following? General activity 7 Relation with others 5 Enjoyment of life 8 What TIME of day is your pain at its worst? evening Sleep (in general) Fair  Pain is worse with: walking and standing Pain improves with: rest, heat/ice, and therapy/exercise Relief from Meds: 6  Family History  Problem Relation Age of Onset   Cancer Mother        multiple myeloma   Diabetes Father    Kidney disease Father    Heart disease Father    Social History   Socioeconomic History   Marital status: Married    Spouse name: Not on file   Number of children: 2   Years of education: some college   Highest education level: Not on file  Occupational History   Occupation: homemaker  Tobacco Use   Smoking status: Never   Smokeless tobacco: Never  Vaping Use   Vaping Use: Never used  Substance and Sexual Activity   Alcohol use: No    Alcohol/week: 0.0 standard drinks   Drug use: No   Sexual activity: Not on file   Other Topics Concern   Not on file  Social History Narrative   Lives at home with husband.   Right-handed.   No daily caffeine use.   Social Determinants of Health   Financial Resource Strain: Not on file  Food Insecurity: Not on file  Transportation Needs: Not on file  Physical Activity: Not on file  Stress: Not on file  Social Connections: Not on file   Past Surgical History:  Procedure Laterality Date   CERVICAL SPINE SURGERY     C6-7 fusion   CESAREAN SECTION     x2   dental implant  July 2015   ENDOSCOPIC PLANTAR FASCIOTOMY Right August 2015   GASTRIC BYPASS     HAND SURGERY Right    LITHOTRIPSY     multiple   PLANTAR FASCIECTOMY Right    SHOULDER SURGERY Left 10/2004   SMALL INTESTINE SURGERY     cervical laminectomy   utereroscopy  1995   Past Surgical History:  Procedure Laterality Date   CERVICAL SPINE SURGERY     C6-7 fusion   CESAREAN SECTION     x2   dental implant  July 2015   ENDOSCOPIC PLANTAR FASCIOTOMY Right August 2015   GASTRIC BYPASS     HAND SURGERY Right    LITHOTRIPSY     multiple  FASCIECTOMY Right   ° SHOULDER SURGERY Left 10/2004  ° SMALL INTESTINE SURGERY    ° cervical laminectomy  ° utereroscopy  1995  ° °Past Medical History:  °Diagnosis Date  ° Anxiety   ° Chronic kidney disease   ° stones  ° Chronic pain   ° Complex regional pain syndrome I   ° right foot  ° Depression   ° Diabetes mellitus   ° Hypertension   ° Kidney stone   ° Neuromuscular disorder (HCC)   ° Tremor   ° °BP 105/74    Pulse 93    Ht 5' 7" (1.702 m)    Wt 270 lb (122.5 kg)    SpO2 95%    BMI 42.29 kg/m²  ° °Opioid Risk Score:   °Fall Risk Score:  `1 ° °Depression screen PHQ 2/9 ° °Depression screen PHQ 2/9 06/24/2021 05/26/2021 02/23/2021 12/31/2020 11/26/2020 09/29/2020 07/31/2020  °Decreased Interest 0 1 0 0 0 0 0  °Down, Depressed, Hopeless 0 1 0 0 0 0 0  °PHQ - 2 Score 0 2 0 0 0 0 0  °Altered sleeping - - - - - - -  °Tired, decreased energy - - - - - - -  °Change in  appetite - - - - - - -  °Feeling bad or failure about yourself  - - - - - - -  °Trouble concentrating - - - - - - -  °Moving slowly or fidgety/restless - - - - - - -  °Suicidal thoughts - - - - - - -  °PHQ-9 Score - - - - - - -  °Some recent data might be hidden  °  ° °Review of Systems  °Musculoskeletal:   °     Left shoulder and arm pain °Right knee pain °Bilateral back of knee pain °Right ankle pain  °All other systems reviewed and are negative. ° °   °Objective:  ° Physical Exam °Vitals and nursing note reviewed.  °Constitutional:   °   Appearance: Normal appearance.  °Cardiovascular:  °   Rate and Rhythm: Normal rate and regular rhythm.  °   Pulses: Normal pulses.  °   Heart sounds: Normal heart sounds.  °Pulmonary:  °   Effort: Pulmonary effort is normal.  °   Breath sounds: Normal breath sounds.  °Musculoskeletal:  °   Cervical back: Normal range of motion and neck supple.  °   Comments: Normal Muscle Bulk and Muscle Testing Reveals:  °Upper Extremities: Full ROM and Muscle Strength 5/5  °Thoracic Paraspinal Tenderness: T-3-T-6 Mainly Left Side °Lower Extremities: Full ROM and Muscle Strength 5/5 °Arises from Table with ease °Narrow Based  Gait  °   °Skin: °   General: Skin is warm and dry.  °Neurological:  °   Mental Status: She is alert and oriented to person, place, and time.  °Psychiatric:     °   Mood and Affect: Mood normal.     °   Behavior: Behavior normal.  ° ° ° ° °   °Assessment & Plan:  °1.Complex regional pain syndrome right lower extremity postoperative. She's s/p post plantar fascial release. She continue with sensitivity to touch. She has diffuse numbness and tingling. Continue Gabapentin. 06/24/2021. °Refilled: Nucynta 100 mg one capsules every 6 hours as needed for pain #120.Second script sent for the following month to accommodate scheduled appointment. °We will continue the opioid monitoring program, this consists of regular clinic visits, examinations, urine drug screen, pill   counts as  well as use of [place] Controlled Substance Reporting system. A 12 month History has been reviewed on the [place] Controlled Substance Reporting System on 06/24/2021. °2.Myofascial pain syndrome: Continue current medication regime with Tizanidine and  ice, heat and exercise regime. 06/24/2021 °3. Depression: Continue: Zoloft. PCP Following. 06/24/2021 °4. Cervical Post Laminectomy: Continue to Monitor. 06/24/2021 °5. Cervicalgia/Cervical  Radiculopathy/ Left shoulder, Left Arm and Left  Hand Pain with tingling,  Continue current medication regime with  Gabapentin 06/24/2021 °6. Poly Neuropathy: Continue Gabapentin:Continue to Monitor. 06/24/2021 °7. Left Shoulder Tendonitis: Chronic Left Shoulder Pain:  Ortho Following. Continue current medication regime and Continue  to Monitor. 06/24/2021 °8. Right hand pain/ Post Surgical  Procedure: Trigger Finger Release: Dr. Hernandez Following.  No complaints Today. 06/24/2021. °9. Tremor: No complaints today. Continue with Gabapentin decreased to 600 mg five times a day. 02/01//2023 °10. Bilateral  Knee Pain: Orthopedics Following: No Complaints today. Continue with HEP as Tolerated. Continue to Monitor. 02/01/ 2023 °11. Chronic Bilateral  Feet Pain. Podiatry Following.Continue with HEP as tolerated. Continue current medication regimen. Continue to monitor. 06/24/2021 ° 12. Left Greater Trochanter Bursitis: No complaints today. Continue to alternate Ice and Heat Therapy. Continue to Monitor. 06/24/2021 °  °  ° F/U in 1 month °  ° °

## 2021-06-26 ENCOUNTER — Encounter: Payer: Self-pay | Admitting: Registered Nurse

## 2021-06-29 LAB — TOXASSURE SELECT,+ANTIDEPR,UR

## 2021-06-30 ENCOUNTER — Telehealth: Payer: Self-pay | Admitting: *Deleted

## 2021-06-30 NOTE — Telephone Encounter (Signed)
Urine drug screen for this encounter is consistent for prescribed medication 

## 2021-07-22 ENCOUNTER — Other Ambulatory Visit: Payer: Self-pay

## 2021-07-22 ENCOUNTER — Encounter: Payer: Self-pay | Admitting: Registered Nurse

## 2021-07-22 ENCOUNTER — Encounter: Payer: 59 | Attending: Physical Medicine & Rehabilitation | Admitting: Registered Nurse

## 2021-07-22 VITALS — BP 129/84 | HR 104 | Ht 67.0 in | Wt 271.0 lb

## 2021-07-22 DIAGNOSIS — G894 Chronic pain syndrome: Secondary | ICD-10-CM | POA: Insufficient documentation

## 2021-07-22 DIAGNOSIS — M1711 Unilateral primary osteoarthritis, right knee: Secondary | ICD-10-CM | POA: Insufficient documentation

## 2021-07-22 DIAGNOSIS — M546 Pain in thoracic spine: Secondary | ICD-10-CM | POA: Diagnosis present

## 2021-07-22 DIAGNOSIS — Z5181 Encounter for therapeutic drug level monitoring: Secondary | ICD-10-CM | POA: Diagnosis present

## 2021-07-22 DIAGNOSIS — M542 Cervicalgia: Secondary | ICD-10-CM | POA: Insufficient documentation

## 2021-07-22 DIAGNOSIS — G90521 Complex regional pain syndrome I of right lower limb: Secondary | ICD-10-CM | POA: Diagnosis present

## 2021-07-22 DIAGNOSIS — M79671 Pain in right foot: Secondary | ICD-10-CM | POA: Insufficient documentation

## 2021-07-22 DIAGNOSIS — M5412 Radiculopathy, cervical region: Secondary | ICD-10-CM | POA: Diagnosis present

## 2021-07-22 DIAGNOSIS — G8929 Other chronic pain: Secondary | ICD-10-CM | POA: Insufficient documentation

## 2021-07-22 DIAGNOSIS — Z79891 Long term (current) use of opiate analgesic: Secondary | ICD-10-CM | POA: Insufficient documentation

## 2021-07-22 MED ORDER — NUCYNTA 100 MG PO TABS: ORAL_TABLET | ORAL | 0 refills | Status: DC

## 2021-07-22 MED ORDER — GABAPENTIN 600 MG PO TABS: 600.0000 mg | ORAL_TABLET | Freq: Every day | ORAL | 3 refills | Status: DC

## 2021-07-22 MED ORDER — TIZANIDINE HCL 4 MG PO TABS: 4.0000 mg | ORAL_TABLET | Freq: Every day | ORAL | 5 refills | Status: DC | PRN

## 2021-07-22 NOTE — Progress Notes (Signed)
? ?Subjective:  ? ? Patient ID: Sabrina Williams, female    DOB: 1970/12/27, 51 y.o.   MRN: [id number] ? ?HPI: Sabrina Williams is a 51 y.o. female who returns for follow up appointment for chronic pain and medication refill. She states her pain is located in her neck radiating into her left shoulder, left arm and left hand with tingling. Also reports mid- back pain mainly left side, right knee pain and right foot pain. She rates her pain 3. Her current exercise regime is walking and performing stretching exercises. ? ?Ms. Zaragoza Morphine equivalent is 160.00 MME.   Last UDS was Performed on 06/24/2021, it was consistent. ?  ? ?Pain Inventory ?Average Pain 4 ?Pain Right Now 3 ?My pain is constant, sharp, burning, dull, stabbing, and aching ? ?In the last 24 hours, has pain interfered with the following? ?General activity 7 ?Relation with others 3 ?Enjoyment of life 5 ?What TIME of day is your pain at its worst? morning  and evening ?Sleep (in general) Fair ? ?Pain is worse with: walking, standing, and some activites ?Pain improves with: rest, heat/ice, therapy/exercise, and medication ?Relief from Meds: 7 ? ?Family History  ?Problem Relation Age of Onset  ? Cancer Mother   ?     multiple myeloma  ? Diabetes Father   ? Kidney disease Father   ? Heart disease Father   ? ?Social History  ? ?Socioeconomic History  ? Marital status: Married  ?  Spouse name: Not on file  ? Number of children: 2  ? Years of education: some college  ? Highest education level: Not on file  ?Occupational History  ? Occupation: homemaker  ?Tobacco Use  ? Smoking status: Never  ? Smokeless tobacco: Never  ?Vaping Use  ? Vaping Use: Never used  ?Substance and Sexual Activity  ? Alcohol use: No  ?  Alcohol/week: 0.0 standard drinks  ? Drug use: No  ? Sexual activity: Not on file  ?Other Topics Concern  ? Not on file  ?Social History Narrative  ? Lives at home with husband.  ? Right-handed.  ? No daily caffeine use.  ? ?Social Determinants of  Health  ? ?Financial Resource Strain: Not on file  ?Food Insecurity: Not on file  ?Transportation Needs: Not on file  ?Physical Activity: Not on file  ?Stress: Not on file  ?Social Connections: Not on file  ? ?Past Surgical History:  ?Procedure Laterality Date  ? CERVICAL SPINE SURGERY    ? C6-7 fusion  ? CESAREAN SECTION    ? x2  ? dental implant  July 2015  ? ENDOSCOPIC PLANTAR FASCIOTOMY Right August 2015  ? GASTRIC BYPASS    ? HAND SURGERY Right   ? LITHOTRIPSY    ? multiple  ? PLANTAR FASCIECTOMY Right   ? SHOULDER SURGERY Left 10/2004  ? SMALL INTESTINE SURGERY    ? cervical laminectomy  ? utereroscopy  1995  ? ?Past Surgical History:  ?Procedure Laterality Date  ? CERVICAL SPINE SURGERY    ? C6-7 fusion  ? CESAREAN SECTION    ? x2  ? dental implant  July 2015  ? ENDOSCOPIC PLANTAR FASCIOTOMY Right August 2015  ? GASTRIC BYPASS    ? HAND SURGERY Right   ? LITHOTRIPSY    ? multiple  ? PLANTAR FASCIECTOMY Right   ? SHOULDER SURGERY Left 10/2004  ? SMALL INTESTINE SURGERY    ? cervical laminectomy  ? utereroscopy  1995  ? ?Past Medical History:  ?  Diagnosis Date  ? Anxiety   ? Chronic kidney disease   ? stones  ? Chronic pain   ? Complex regional pain syndrome I   ? right foot  ? Depression   ? Diabetes mellitus   ? Hypertension   ? Kidney stone   ? Neuromuscular disorder (HCC)   ? Tremor   ? ?BP 129/84   Pulse (!) 104   Ht 5' 7" (1.702 m)   Wt 271 lb (122.9 kg)   SpO2 95%   BMI 42.44 kg/m?  ? ?Opioid Risk Score:   ?Fall Risk Score:  `1 ? ?Depression screen PHQ 2/9 ? ?Depression screen PHQ 2/9 06/24/2021 05/26/2021 02/23/2021 12/31/2020 11/26/2020 09/29/2020 07/31/2020  ?Decreased Interest 0 1 0 0 0 0 0  ?Down, Depressed, Hopeless 0 1 0 0 0 0 0  ?PHQ - 2 Score 0 2 0 0 0 0 0  ?Altered sleeping - - - - - - -  ?Tired, decreased energy - - - - - - -  ?Change in appetite - - - - - - -  ?Feeling bad or failure about yourself  - - - - - - -  ?Trouble concentrating - - - - - - -  ?Moving slowly or fidgety/restless - - - - - - -   ?Suicidal thoughts - - - - - - -  ?PHQ-9 Score - - - - - - -  ?Some recent data might be hidden  ?  ? ?Review of Systems  ?Musculoskeletal:   ?     Left arm pain ?Right knee pain  ?All other systems reviewed and are negative. ? ?   ?Objective:  ? Physical Exam ?Vitals and nursing note reviewed.  ?Constitutional:   ?   Appearance: Normal appearance.  ?Neck:  ?   Comments: Cervical Paraspinal Tenderness: C-5-C-6 Mainly Left Side  ?Cardiovascular:  ?   Rate and Rhythm: Normal rate and regular rhythm.  ?   Pulses: Normal pulses.  ?   Heart sounds: Normal heart sounds.  ?Pulmonary:  ?   Effort: Pulmonary effort is normal.  ?   Breath sounds: Normal breath sounds.  ?Musculoskeletal:  ?   Cervical back: Normal range of motion and neck supple.  ?   Comments: Normal Muscle Bulk and Muscle Testing Reveals:  ?Upper Extremities: Full ROM and Muscle Strength 5/5 ?Left AC Joint Tenderness ?Lower Extremities: Full ROM and Muscle Strength 5/5 ?Arises from Table with ease ?Narrow Based  Gait  ?   ?Skin: ?   General: Skin is warm and dry.  ?Neurological:  ?   Mental Status: She is alert and oriented to person, place, and time.  ?Psychiatric:     ?   Mood and Affect: Mood normal.     ?   Behavior: Behavior normal.  ? ? ? ? ?   ?Assessment & Plan:  ?1.Complex regional pain syndrome right lower extremity postoperative. She's s/p post plantar fascial release. She continue with sensitivity to touch. She has diffuse numbness and tingling. Continue Gabapentin. 07/22/2021. ?Refilled: Nucynta 100 mg one capsules every 6 hours as needed for pain #120.We will continue the opioid monitoring program, this consists of regular clinic visits, examinations, urine drug screen, pill counts as well as use of [place] Controlled Substance Reporting system. A 12 month History has been reviewed on the [place] Controlled Substance Reporting System on 07/22/2021. ?2.Myofascial pain syndrome: Continue current medication regime with Tizanidine  and  ice, heat and exercise regime.   07/22/2021 ?3. Depression: Continue: Zoloft. PCP Following. 07/22/2021 ?4. Cervical Post Laminectomy: Continue to Monitor. 07/22/2021 ?5. Cervicalgia/Cervical  Radiculopathy/ Left shoulder, Left Arm and Left  Hand Pain with tingling,  Continue current medication regime with  Gabapentin 07/22/2021 ?6. Poly Neuropathy: Continue Gabapentin:Continue to Monitor. 07/22/2021 ?7. Left Shoulder Tendonitis: Chronic Left Shoulder Pain:  Ortho Following. Continue current medication regime and Continue  to Monitor. 07/22/2021 ?8. Right hand pain/ Post Surgical  Procedure: Trigger Finger Release: Dr. Hernandez Following.  No complaints Today. 07/22/2021. ?9. Tremor: No complaints today. Continue with Gabapentin decreased to 600 mg five times a day. 03/01//2023 ?10. Bilateral  Knee Pain: Orthopedics Following: No Complaints today. Continue with HEP as Tolerated. Continue to Monitor. 03/01/ 2023 ?11. Chronic Bilateral  Feet Pain. Podiatry Following.Continue with HEP as tolerated. Continue current medication regimen. Continue to monitor. 07/22/2021 ? 12. Left Greater Trochanter Bursitis: No complaints today. Continue to alternate Ice and Heat Therapy. Continue to Monitor. 07/22/2021 ?  ?  ? F/U in 1 month ?  ?  ? ? ? ? ? ? ? ? ? ?

## 2021-07-28 ENCOUNTER — Other Ambulatory Visit: Payer: Self-pay | Admitting: Nephrology

## 2021-07-28 DIAGNOSIS — N184 Chronic kidney disease, stage 4 (severe): Secondary | ICD-10-CM

## 2021-08-05 ENCOUNTER — Ambulatory Visit
Admission: RE | Admit: 2021-08-05 | Discharge: 2021-08-05 | Disposition: A | Discharge status: Home / Self Care | Payer: 59 | Source: Ambulatory Visit | Attending: Nephrology | Admitting: Nephrology

## 2021-08-05 ENCOUNTER — Other Ambulatory Visit: Payer: Self-pay

## 2021-08-05 DIAGNOSIS — N184 Chronic kidney disease, stage 4 (severe): Secondary | ICD-10-CM | POA: Diagnosis not present

## 2021-08-06 ENCOUNTER — Ambulatory Visit (INDEPENDENT_AMBULATORY_CARE_PROVIDER_SITE_OTHER): Payer: 59 | Admitting: Neurology

## 2021-08-06 ENCOUNTER — Encounter: Payer: Self-pay | Admitting: Neurology

## 2021-08-06 VITALS — BP 113/61 | HR 84 | Ht 67.0 in | Wt 270.5 lb

## 2021-08-06 DIAGNOSIS — G4733 Obstructive sleep apnea (adult) (pediatric): Secondary | ICD-10-CM

## 2021-08-06 DIAGNOSIS — R42 Dizziness and giddiness: Secondary | ICD-10-CM | POA: Diagnosis not present
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
## 2021-08-07 LAB — SEDIMENTATION RATE: Sed Rate: 17 mm/hr (ref 0–40)

## 2021-08-07 LAB — GABAPENTIN LEVEL: Gabapentin Lvl: 37.9 ug/mL (ref 4.0–16.0)

## 2021-08-07 LAB — ANA W/REFLEX IF POSITIVE: Anti Nuclear Antibody (ANA): NEGATIVE

## 2021-08-07 LAB — C-REACTIVE PROTEIN: CRP: 4 mg/L (ref 0–10)

## 2021-08-08 ENCOUNTER — Encounter: Payer: Self-pay | Admitting: Neurology

## 2021-08-11 ENCOUNTER — Telehealth: Payer: Self-pay | Admitting: Neurology

## 2021-08-11 ENCOUNTER — Telehealth: Payer: Self-pay

## 2021-08-11 NOTE — Telephone Encounter (Signed)
Pt is asking if Dr Krista Blue can refer her to another sleep group because she is unable to wait until May to be seen, please call. ?

## 2021-08-11 NOTE — Telephone Encounter (Signed)
Please call patient, your gabapentin level is significantly elevated 37.9, ? ?Gabapentin is heavily renal secreted, you have abnormal kidney function, creatinine 2.1, estimated GFR 25, recommendation, maximum daily dose of gabapentin with your kidney function should be 300 mg twice a day ? ?Please taper down gabapentin within the therapeutic range, ? ?Might explain some hold for excessive fatigue, drowsiness  ?

## 2021-08-11 NOTE — Telephone Encounter (Signed)
I called the patient to schedule her sleep consult that Dr. Krista Blue had placed a referral for. Patient became very upset that the first available consult appointment was on May 9th. She stated a surgery she was scheduled to have on this Friday had been cancelled due to the anesthesiologist wanting to wait until after her sleep study results before having the surgery. I apologized and offered to place her on our wait list for a cancellation for a sooner appointment. Patient requested Dr. Krista Blue send her referral somewhere else that could have her do a sleep study sooner. I let her know I would send a message to Dr. Krista Blue regarding her request.  ? ?Can someone please reach out to this patient and address her concerns/desire to have her referral placed at another facility where she will be seen sooner?   ?

## 2021-08-11 NOTE — Telephone Encounter (Signed)
There is a separate call with this request. It has been routed to Dr Krista Blue to be addressed. ?

## 2021-08-11 NOTE — Telephone Encounter (Signed)
I have forward the note to Dr. Rexene Alberts and Dr. Brett Fairy, hopefully, she can be offered a sooner appointment. ? ? ?

## 2021-08-11 NOTE — Telephone Encounter (Signed)
Spoke with pt to inform of results. Advised patient of maximum daily dose of gabapentin 300 mg twice a day. Patient repeated correct dosage of gabapentin to take back to me. She stated she would contact her pain management doctor regarding tapering down gabapentin. I instructed pt to call back with any further questions or concerns. Pt verbalized understanding and expressed appreciation for the call. ?

## 2021-08-12 ENCOUNTER — Encounter: Payer: Self-pay | Admitting: Neurology

## 2021-08-13 ENCOUNTER — Other Ambulatory Visit: Payer: Self-pay | Admitting: Nephrology

## 2021-08-13 ENCOUNTER — Other Ambulatory Visit: Payer: Self-pay

## 2021-08-13 ENCOUNTER — Telehealth: Payer: Self-pay | Admitting: Neurology

## 2021-08-13 ENCOUNTER — Encounter: Payer: Self-pay | Admitting: Neurology

## 2021-08-13 ENCOUNTER — Ambulatory Visit: Payer: 59 | Admitting: Neurology

## 2021-08-13 VITALS — BP 104/67 | HR 94 | Ht 67.0 in | Wt 271.5 lb

## 2021-08-13 DIAGNOSIS — G4753 Recurrent isolated sleep paralysis: Secondary | ICD-10-CM

## 2021-08-13 DIAGNOSIS — Z9884 Bariatric surgery status: Secondary | ICD-10-CM

## 2021-08-13 DIAGNOSIS — G4713 Recurrent hypersomnia: Secondary | ICD-10-CM

## 2021-08-13 DIAGNOSIS — N184 Chronic kidney disease, stage 4 (severe): Secondary | ICD-10-CM

## 2021-08-13 DIAGNOSIS — G2581 Restless legs syndrome: Secondary | ICD-10-CM

## 2021-08-13 DIAGNOSIS — G4719 Other hypersomnia: Secondary | ICD-10-CM

## 2021-08-13 DIAGNOSIS — R42 Dizziness and giddiness: Secondary | ICD-10-CM

## 2021-08-13 DIAGNOSIS — R55 Syncope and collapse: Secondary | ICD-10-CM

## 2021-08-13 DIAGNOSIS — Z6841 Body Mass Index (BMI) 40.0 and over, adult: Secondary | ICD-10-CM

## 2021-08-13 DIAGNOSIS — E161 Other hypoglycemia: Secondary | ICD-10-CM

## 2021-08-13 NOTE — Patient Instructions (Signed)
After spending a total time of  35  minutes face to face and additional time for physical and neurologic examination, review of laboratory studies,  ?personal review of imaging studies, reports and results of other testing and review of referral information / records as far as provided in visit, I have established the following assessments: ? ?1) This patient has post-prandial hypotension and feels presyncopal.  ?2) Her risk for OSA is high, by BMI, by airway anatomy, and by witnessed snoring. Likes to sleep supine.  ?3) RLS, not every night .  ?4) reports vivid dreams , hypnopompic hallucinations, sleep paralysis, but no cataplexy.  ? ? ? ? My Plan is to proceed with: ? ?1)pre =op evaluation by PSG or HS possible, will ask for first available test.  ?2) My goal is to long term evaluate for possible narcolepsy- today we will draw an HLA narcolepsy panel.  ?3) needs BP monitoring for 7 days, with blood glucose monitoring- a regular cardiac monitor was unrevealing  ? ?I would like to thank Marcial Pacas, MD PhD,  and Latanya Maudlin, Np ?[address] ?[place],  [place] [address] for allowing me to meet with and to take care of this pleasant patient.  ? ?In short, Sabrina Williams is presenting with postprandial sleepiness spells, near fainting, EDS and  snoring. ? ?I plan to follow up either personally or through our NP within 2 months.  ? ?CC: I will share my notes with Dr Krista Blue and NP Michaelle Copas . ?

## 2021-08-13 NOTE — Telephone Encounter (Signed)
[employer] no Roxanna Mew, sent Butch Penny a message she will reach out to the patient to schedule.  ?

## 2021-08-13 NOTE — Progress Notes (Signed)
? ? ?SLEEP MEDICINE CLINIC ?  ? ?Provider:  Larey Seat, MD  ?Primary Care Physician:  Latanya Maudlin, NP ?[address] ?[place] [address]  ? ?  ?Referring Provider:  Dr Krista Blue. ? ?    ?Chief Complaint according to patient   ?Patient presents with:  ?  ? New Patient (Initial Visit)  ?     ?  ?  ?HISTORY OF PRESENT ILLNESS:  ?Sabrina Williams is a 51 y.o. Caucasian female patient seen upon referral on 08/13/2021 from Dr Krista Blue to help her get a sleep study and evaluation prior to a foot surgery.  ?She reports postprandial BP dropping-  for a period of 30 minutes after a meal , heavier meals. She has had a gastric sleeve surgery in the past. Not gastric Bypass. Gave up driving after meal time. ?She has seasonal allergies and is on heavy pain medications,too.  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 ?Chief concern according to patient :  " I am nearly fainting after a meal, and go sleep" . ?  ?    ?Sleep relevant medical history: Snoring, Achilles tendon disorder, had plantar fasciitis in 2015, became tendonitis after a previous surgery, had chronic pain.  Morbid obesity, ( BMI 42)  status post sleeve- not gastric bypass. She has al implanted teeth and a small mouth, this changed her jaw alignment is off.  ?   ? Family medical /sleep history: mother had OSA, father had been a snorer. Half sister has sleep apnea and her 20 year -old son does too.  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  ? and lives in a household with spouse,  one dog.  Dog sleeps with her.  ?Tobacco use: none .  ETOH use ; none , Caffeine intake in form of Coffee( /) Soda( on occasion) Tea ( once a day) , no energy drinks. ?   ?Sleep habits are as follows: The patient's dinner time is between 6-6.30 PM. The patient goes to bed at 11-12 PM and continues to sleep for an average of 7 hours.   ?The preferred sleep position is supine, with the support of 1  pillow- body pillow between knees-. Dreams are reportedly very frequent/vivid. 7-8  AM is the usual rise time. The patient wakes up with 2-3 alarms !  ?She reports not feeling refreshed or restored in AM, with symptoms such as dry mouth, rare morning headaches, and residual fatigue.  ?Naps are taken frequently, sometimes already in AM , before lunch, but feeling sleepy after each meal-  lasting from 15 to 45 minutes.  ? ? ?  ?Review of Systems: ?Out of a complete 14 system review, the patient complains of only the following symptoms, and all other reviewed systems are negative.:  ?Fatigue, sleepiness , snoring,  ? ? ?Very vivid dreams, automatism in sleep, sleep talking, hypnagogic hypnapomic hallucinations. ? ?No cataplexy.  ? ?Postprandial pre-syncope, sleepiness.  ? ?Post gastric surgery she reversed her DM, her hyperlipidemia, pain.  ? ?Post prandial nausea. ? ? Breast reduction pending.  ? ? ?RLS  ?  ?How likely are you to doze in the following situations: ?0 = not likely, 1 = slight chance, 2 = moderate chance, 3 = high chance ?  ?Sitting and Reading? ?Watching Television? ?Sitting inactive in a public place (theater or meeting)? ?As a passenger in a car for an hour without a break? ?  Lying down in the afternoon when circumstances permit? ?Sitting and talking to someone? ?Sitting quietly after lunch without alcohol? ?In a car, while stopped for a few minutes in traffic? ?  ?Total = 13/ 24 points  ? FSS endorsed at 56/ 63 points.  ? ?Social History  ? ?Socioeconomic History  ? Marital status: Married  ?  Spouse name: Not on file  ? Number of children: 2  ? Years of education: some college  ? Highest education level: Not on file  ?Occupational History  ? Occupation: homemaker  ?Tobacco Use  ? Smoking status: Never  ? Smokeless tobacco: Never  ?Vaping Use  ? Vaping Use: Never used  ?Substance and Sexual Activity  ? Alcohol use: No  ?  Alcohol/week: 0.0 standard drinks  ? Drug use: No  ? Sexual activity: Not on  file  ?Other Topics Concern  ? Not on file  ?Social History Narrative  ? Lives at home with husband.  ? Right-handed.  ? No daily caffeine use.  ? ?Social Determinants of Health  ? ?Financial Resource Strain: Not on file  ?Food Insecurity: Not on file  ?Transportation Needs: Not on file  ?Physical Activity: Not on file  ?Stress: Not on file  ?Social Connections: Not on file  ? ? ?Family History  ?Problem Relation Age of Onset  ? Cancer Mother   ?     multiple myeloma  ? Diabetes Father   ? Kidney disease Father   ? Heart disease Father   ? ? ?Past Medical History:  ?Diagnosis Date  ? Anxiety   ? Chronic kidney disease   ? stones  ? Chronic pain   ? Complex regional pain syndrome I   ? right foot  ? Depression   ? Diabetes mellitus   ? Hypertension   ? Kidney stone   ? Neuromuscular disorder ([place])   ? Tremor   ? ? ?Past Surgical History:  ?Procedure Laterality Date  ? CERVICAL SPINE SURGERY    ? C6-7 fusion  ? CESAREAN SECTION    ? x2  ? dental implant  July 2015  ? ENDOSCOPIC PLANTAR FASCIOTOMY Right August 2015  ? GASTRIC BYPASS    ? HAND SURGERY Right   ? LITHOTRIPSY    ? multiple  ? PLANTAR FASCIECTOMY Right   ? SHOULDER SURGERY Left 10/2004  ? SMALL INTESTINE SURGERY    ? cervical laminectomy  ? utereroscopy  1995  ?  ? ?Current Outpatient Medications on File Prior to Visit  ?Medication Sig Dispense Refill  ? ascorbic acid (VITAMIN C) 500 MG tablet Take by mouth.    ? B Complex Vitamins (B COMPLEX 1 PO) Take 1 tablet by mouth daily.    ? benazepril (LOTENSIN) 20 MG tablet Take by mouth daily.    ? Bismuth Subgallate (DEVROM) 200 MG CHEW Chew 1 tablet by mouth daily.    ? calcitRIOL (ROCALTROL) 0.5 MCG capsule Take 0.5 mcg by mouth daily.    ? calcium carbonate 1250 MG capsule Take 1,250 mg by mouth daily.    ? Cholecalciferol (VITAMIN D3 PO) Take 2,000 Units by mouth daily.    ? DULoxetine (CYMBALTA) 60 MG capsule Take 60 mg by mouth daily.    ? ferrous sulfate 325 (65 FE) MG tablet PLEASE SEE ATTACHED FOR  DETAILED DIRECTIONS    ? Fingerstix Lancets MISC Use 1 each 2 (two) times daily As directed [Test blood sugars in rotating pattern:  fasting, before meals, 2 hours after  a meal, at bedtime]    ? gabapentin (NEURONTIN) 600 MG tablet Take 1 tablet (600 mg total) by mouth 5 (five) times daily. 150 tablet 3  ? glucose blood test strip USE 1 STRIP TWICE A DAY AS DIRECTED    ? hydrochlorothiazide (MICROZIDE) 12.5 MG capsule Take by mouth.    ? lovastatin (MEVACOR) 20 MG tablet SMARTSIG:1 Tablet(s) By Mouth Every Evening    ? magnesium oxide (MAG-OX) 400 (240 Mg) MG tablet Take 1 tablet by mouth daily.    ? Multiple Vitamins-Minerals (BARIATRIC MULTIVITAMINS/IRON) CAPS Take 3 capsules by mouth daily before supper.    ? omeprazole (PRILOSEC) 10 MG capsule Take by mouth.    ? Tapentadol HCl (NUCYNTA) 100 MG TABS TAKE 1 TABLET (100 MG TOTAL) BY MOUTH EVERY 6 (SIX) HOURS AS NEEDED. 120 tablet 0  ? tiZANidine (ZANAFLEX) 4 MG tablet Take 1 tablet (4 mg total) by mouth 5 (five) times daily as needed for muscle spasms. 150 tablet 5  ? ?No current facility-administered medications on file prior to visit.  ? ? ?Allergies  ?Allergen Reactions  ? Penicillins Hives and Rash  ? ? ?Physical exam: ? ?Today's Vitals  ? 08/13/21 1454  ?BP: 104/67  ?Pulse: 94  ?Weight: 271 lb 8 oz (123.2 kg)  ?Height: _0  (1.702 m)  ? ?Body mass index is 42.52 kg/m?.  ? ?Wt Readings from Last 3 Encounters:  ?08/13/21 271 lb 8 oz (123.2 kg)  ?08/06/21 270 lb 8 oz (122.7 kg)  ?07/22/21 271 lb (122.9 kg)  ?  ? ?Ht Readings from Last 3 Encounters:  ?08/13/21 _1  (1.702 m)  ?08/06/21 _2  (1.702 m)  ?07/22/21 _3  (1.702 m)  ?  ?  ?General: The patient is awake, alert and appears not in acute distress. The patient is well groomed. ?Head: Normocephalic, atraumatic. Neck is supple. ? Mallampati 3,  ?neck circumference:16.25  inches . Nasal airflow patent.  Retrognathia is not  seen.  ?Dental status: implants.  ?Cardiovascular:  Regular rate and cardiac rhythm  by pulse,  without distended neck veins. ?Respiratory: Lungs are clear to auscultation.  ?Skin:  Without evidence of ankle edema, or rash. ?Trunk: The patient's posture is erect. ?  ?Neurologic exam : ?The patient

## 2021-08-21 ENCOUNTER — Ambulatory Visit
Admission: RE | Admit: 2021-08-21 | Discharge: 2021-08-21 | Disposition: A | Discharge status: Home / Self Care | Payer: 59 | Source: Ambulatory Visit | Attending: Nephrology | Admitting: Nephrology

## 2021-08-21 DIAGNOSIS — N184 Chronic kidney disease, stage 4 (severe): Secondary | ICD-10-CM

## 2021-08-21 LAB — NARCOLEPSY EVALUATION
DQA1*01:02: POSITIVE
DQB1*06:02: POSITIVE

## 2021-08-24 ENCOUNTER — Encounter: Payer: Self-pay | Admitting: Neurology

## 2021-08-24 DIAGNOSIS — G4753 Recurrent isolated sleep paralysis: Secondary | ICD-10-CM

## 2021-08-24 DIAGNOSIS — Z9884 Bariatric surgery status: Secondary | ICD-10-CM

## 2021-08-24 DIAGNOSIS — G2581 Restless legs syndrome: Secondary | ICD-10-CM

## 2021-08-24 DIAGNOSIS — G4719 Other hypersomnia: Secondary | ICD-10-CM

## 2021-08-24 NOTE — Progress Notes (Signed)
We will have a HST set up for Monday, Step one is rule out apnea. ?If apnea is found will need to treat and document response to treatment.  ?If not responsive or no apnea found, she will come in for PSG with [facility].

## 2021-08-24 NOTE — Progress Notes (Signed)
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

## 2021-08-25 ENCOUNTER — Encounter: Payer: 59 | Attending: Physical Medicine & Rehabilitation | Admitting: Registered Nurse

## 2021-08-25 ENCOUNTER — Encounter: Payer: Self-pay | Admitting: Registered Nurse

## 2021-08-25 VITALS — BP 111/77 | HR 93 | Ht 67.0 in | Wt 270.8 lb

## 2021-08-25 DIAGNOSIS — M5412 Radiculopathy, cervical region: Secondary | ICD-10-CM | POA: Insufficient documentation

## 2021-08-25 DIAGNOSIS — N184 Chronic kidney disease, stage 4 (severe): Secondary | ICD-10-CM | POA: Insufficient documentation

## 2021-08-25 DIAGNOSIS — M542 Cervicalgia: Secondary | ICD-10-CM | POA: Insufficient documentation

## 2021-08-25 DIAGNOSIS — G8929 Other chronic pain: Secondary | ICD-10-CM | POA: Insufficient documentation

## 2021-08-25 DIAGNOSIS — M79671 Pain in right foot: Secondary | ICD-10-CM | POA: Diagnosis present

## 2021-08-25 DIAGNOSIS — G894 Chronic pain syndrome: Secondary | ICD-10-CM | POA: Insufficient documentation

## 2021-08-25 DIAGNOSIS — Z5181 Encounter for therapeutic drug level monitoring: Secondary | ICD-10-CM | POA: Diagnosis present

## 2021-08-25 DIAGNOSIS — Z79891 Long term (current) use of opiate analgesic: Secondary | ICD-10-CM | POA: Diagnosis present

## 2021-08-25 DIAGNOSIS — M1711 Unilateral primary osteoarthritis, right knee: Secondary | ICD-10-CM | POA: Insufficient documentation

## 2021-08-25 MED ORDER — TAPENTADOL HCL ER 50 MG PO TB12: 50.0000 mg | ORAL_TABLET | Freq: Two times a day (BID) | ORAL | 0 refills | Status: DC

## 2021-08-25 MED ORDER — NUCYNTA 100 MG PO TABS: ORAL_TABLET | ORAL | 0 refills | Status: DC

## 2021-08-25 NOTE — Progress Notes (Signed)
? ?Subjective:  ? ? Patient ID: NYAIRA HODGENS, female    DOB: March 25, 1971, 51 y.o.   MRN: [id number] ? ?HPI: COURTNIE BRENES is a 51 y.o. female who returns for follow up appointment for chronic pain and medication refill. She states her  pain is located in her neck radiating into her left shoulder, right knee pain and right foot pain with tingling and burning. . Ms. Locey reporting increase intensity of right foot neuropathic pain. She rates her pain 3.  Her current exercise regime is walking short distances.  ? ?Ms. Carrithers seen Dr Krista Blue ( Neurologist on 08/06/2021), note was reviewed and gabapentin level 37.9. Ms. Hunger gabapentin will be weaned slowly,with the increase neuropathic pain, she will be prescribed Nucynta long acting, this was discussed with Dr Letta Pate and he agrees with plan.  ? ? Nucynta has a dual proposed mechanism of action ascending pathways inhibit nociceptive pain signals through activation of mu-opioid receptors and descending pathways inhibit pain impulses through norepinephrine reuptake inhibition. . In the past Ms. Seelig  is currently prescribed Gabapentin, due to her GFR and urine creatine clearance of 60.35, the gabapentin has to be weaned, we will prescribe Nucynta ER along with Nucynta IR. Ms. Urias verbalizes understanding.  Repeat CMP ordered today.  ? ?Ms. Dierolf will call her nephrologist regarding obtaining a 24 hour urine creatine clearance, she will send this provider a my-chart message with his response.  ? ?This provider spoke with Dr Letta Pate and Pharmacist regarding the above.  ? ? ?Ms. Raupp Morphine equivalent is 160.00 MME.  Last UDS was Performed on 06/24/2021, it was consistent.   ?Pain Inventory ?Average Pain 5 ?Pain Right Now 3 ?My pain is constant, sharp, burning, dull, stabbing, tingling, and aching ? ?In the last 24 hours, has pain interfered with the following? ?General activity 6 ?Relation with others 4 ?Enjoyment of life 6 ?What TIME of day is your  pain at its worst? evening ?Sleep (in general) Fair ? ?Pain is worse with: walking, standing, and some activites ?Pain improves with: rest, heat/ice, therapy/exercise, and medication ?Relief from Meds: 5 ? ?Family History  ?Problem Relation Age of Onset  ? Cancer Mother   ?     multiple myeloma  ? Diabetes Father   ? Kidney disease Father   ? Heart disease Father   ? ?Social History  ? ?Socioeconomic History  ? Marital status: Married  ?  Spouse name: Not on file  ? Number of children: 2  ? Years of education: some college  ? Highest education level: Not on file  ?Occupational History  ? Occupation: homemaker  ?Tobacco Use  ? Smoking status: Never  ? Smokeless tobacco: Never  ?Vaping Use  ? Vaping Use: Never used  ?Substance and Sexual Activity  ? Alcohol use: No  ?  Alcohol/week: 0.0 standard drinks  ? Drug use: No  ? Sexual activity: Not on file  ?Other Topics Concern  ? Not on file  ?Social History Narrative  ? Lives at home with husband.  ? Right-handed.  ? No daily caffeine use.  ? ?Social Determinants of Health  ? ?Financial Resource Strain: Not on file  ?Food Insecurity: Not on file  ?Transportation Needs: Not on file  ?Physical Activity: Not on file  ?Stress: Not on file  ?Social Connections: Not on file  ? ?Past Surgical History:  ?Procedure Laterality Date  ? CERVICAL SPINE SURGERY    ? C6-7 fusion  ? CESAREAN SECTION    ?  x2  ? dental implant  July 2015  ? ENDOSCOPIC PLANTAR FASCIOTOMY Right August 2015  ? GASTRIC BYPASS    ? HAND SURGERY Right   ? LITHOTRIPSY    ? multiple  ? PLANTAR FASCIECTOMY Right   ? SHOULDER SURGERY Left 10/2004  ? SMALL INTESTINE SURGERY    ? cervical laminectomy  ? utereroscopy  1995  ? ?Past Surgical History:  ?Procedure Laterality Date  ? CERVICAL SPINE SURGERY    ? C6-7 fusion  ? CESAREAN SECTION    ? x2  ? dental implant  July 2015  ? ENDOSCOPIC PLANTAR FASCIOTOMY Right August 2015  ? GASTRIC BYPASS    ? HAND SURGERY Right   ? LITHOTRIPSY    ? multiple  ? PLANTAR FASCIECTOMY  Right   ? SHOULDER SURGERY Left 10/2004  ? SMALL INTESTINE SURGERY    ? cervical laminectomy  ? utereroscopy  1995  ? ?Past Medical History:  ?Diagnosis Date  ? Anxiety   ? Chronic kidney disease   ? stones  ? Chronic pain   ? Complex regional pain syndrome I   ? right foot  ? Depression   ? Diabetes mellitus   ? Hypertension   ? Kidney stone   ? Neuromuscular disorder ([place])   ? Tremor   ? ?BP 111/77   Pulse 93   Ht $R'5\' 7"'fK$  (1.702 m)   Wt 270 lb 12.8 oz (122.8 kg)   SpO2 97%   BMI 42.41 kg/m?  ? ?Opioid Risk Score:   ?Fall Risk Score:  `1 ? ?Depression screen PHQ 2/9 ? ? ?  08/25/2021  ? 11:15 AM 06/24/2021  ? 11:27 AM 05/26/2021  ?  1:11 PM 02/23/2021  ? 12:09 PM 12/31/2020  ?  9:27 AM 11/26/2020  ? 11:21 AM 09/29/2020  ? 10:37 AM  ?Depression screen PHQ 2/9  ?Decreased Interest 0 0 1 0 0 0 0  ?Down, Depressed, Hopeless 0 0 1 0 0 0 0  ?PHQ - 2 Score 0 0 2 0 0 0 0  ?  ? ?Review of Systems  ?Constitutional: Negative.   ?HENT: Negative.    ?Eyes: Negative.   ?Respiratory: Negative.    ?Cardiovascular: Negative.   ?Gastrointestinal: Negative.   ?Endocrine: Negative.   ?Genitourinary: Negative.   ?Musculoskeletal: Negative.   ?Skin: Negative.   ?Allergic/Immunologic: Negative.   ?Neurological: Negative.   ?Hematological: Negative.   ?Psychiatric/Behavioral: Negative.    ? ?   ?Objective:  ? Physical Exam ?Vitals and nursing note reviewed.  ?Constitutional:   ?   Appearance: Normal appearance.  ?Neck:  ?   Comments: Cervical Paraspinal Tenderness: C-5-C-6 Mainly Left Side  ?Cardiovascular:  ?   Rate and Rhythm: Normal rate and regular rhythm.  ?   Pulses: Normal pulses.  ?   Heart sounds: Normal heart sounds.  ?Pulmonary:  ?   Effort: Pulmonary effort is normal.  ?   Breath sounds: Normal breath sounds.  ?Musculoskeletal:  ?   Cervical back: Normal range of motion and neck supple.  ?   Comments: Normal Muscle Bulk and Muscle Testing Reveals:  ?Upper Extremities:Full  ROM and Muscle Strength  5/5 ?Left AC Joint Tenderness ?Lower  Extremities: Full ROM and Muscle Strength 5/5 ?Arises from Table Slowly ?Antalgic  Gait  ?   ?Skin: ?   General: Skin is warm and dry.  ?Neurological:  ?   Mental Status: She is alert and oriented to person, place, and time.  ?Psychiatric:     ?  Mood and Affect: Mood normal.     ?   Behavior: Behavior normal.  ? ? ? ? ?   ?Assessment & Plan:  ?1.Complex regional pain syndrome right lower extremity postoperative. She's s/p post plantar fascial release. She continue with sensitivity to touch. She has diffuse numbness and tingling.Gabapentin being weaned slowly . 08/25/2021. ?RX: Nucynta 50 mg one tablet every 12 hours #60 and Refilled: Nucynta 100 mg one capsules every 6 hours as needed for pain #120.We will continue the opioid monitoring program, this consists of regular clinic visits, examinations, urine drug screen, pill counts as well as use of [place] Controlled Substance Reporting system. A 12 month History has been reviewed on the [place] Controlled Substance Reporting System on 08/25/2021. ?2.Myofascial pain syndrome: Continue current medication regime with Tizanidine and  ice, heat and exercise regime. 08/25/2021 ?3. Depression: Continue: Zoloft. PCP Following. 08/25/2021 ?4. Cervical Post Laminectomy: Continue to Monitor. 08/25/2021 ?5. Cervicalgia/Cervical  Radiculopathy/ Left shoulder, Left Arm and Left  Hand Pain with tingling,  Continue current medication regimen. Continue to monitor. 08/25/2021 ?6. Poly Neuropathy: Continue current medication regimen and :Continue to Monitor. 08/25/2021 ?7. Left Shoulder Tendonitis: Chronic Left Shoulder Pain:  Ortho Following. Continue current medication regime and Continue  to Monitor. 08/25/2021 ?8. Right hand pain/ Post Surgical  Procedure: Trigger Finger Release: Dr. Jerilee Hoh Following.  No complaints Today. 08/25/2021. ?9. Tremor: No complaints today. Gabapentin being weaned slowly. Continue to monitor.  04/04//2023 ?10. Right   Knee Pain:  Orthopedics Following: Continue with HEP as Tolerated. Continue to Monitor. 04/04/ 2023 ?11. Chronic Bilateral  Feet Pain. Podiatry Following.Continue with HEP as tolerated. Continue current medication regimen. Cornelius Moras

## 2021-08-25 NOTE — Patient Instructions (Addendum)
Slow weaning of Gabapentin:  Start 04/05/ 2023 ?Take  One tablet at Breakfast  ?Take One Tablet at [facility]  ?Take One  1/2 Tablet at Supper  ?Take One Tablet at Bedtime  ? ?Then the Following Week Start on 04/ 04/2022 ?Take One Tablet  4 times a day  ? ? ?Send a My Chart Message with Update on slow weaning of Gabapentin. ? ? ?I will call you this afternoon after speaking with Dr Letta Pate and Pharmacist ?

## 2021-08-26 ENCOUNTER — Telehealth: Payer: Self-pay | Admitting: *Deleted

## 2021-08-26 NOTE — Telephone Encounter (Signed)
Prior auth submitted for Nucynta ER to [employer] via CoverMyMeds. ?

## 2021-08-27 ENCOUNTER — Ambulatory Visit (HOSPITAL_COMMUNITY)
Admission: RE | Admit: 2021-08-27 | Discharge: 2021-08-27 | Disposition: A | Discharge status: Home / Self Care | Payer: 59 | Source: Ambulatory Visit | Attending: Neurology | Admitting: Neurology

## 2021-08-27 DIAGNOSIS — G4733 Obstructive sleep apnea (adult) (pediatric): Secondary | ICD-10-CM | POA: Insufficient documentation

## 2021-08-27 DIAGNOSIS — R42 Dizziness and giddiness: Secondary | ICD-10-CM | POA: Insufficient documentation

## 2021-08-27 NOTE — Telephone Encounter (Signed)
Prior Josem Kaufmann was denied. This request was denied because you did not meet the following clinical requirements: ?Based on the information provided, you do not meet the established medication-specific criteria or ?guidelines for Nucynta Er at this time.  ?The request for coverage for Nucynta Er Tab 50mg , use as directed (60 per month), is denied. This ?decision is based on health plan criteria for Nucynta Er. This medicine is covered only if: ?All of the following: ?(A) Treatment goals are defined and include estimated duration of treatment (treatment goals must ?be provided). ?(B) Unless it is contraindicated, you have not exhibited an adequate response to at least 6 weeks of ?treatment with a tricyclic antidepressant titrated to the maximum tolerated dose (drug and date of trial ?must be provided). ?(C) You have a history of failure, contraindication or intolerance to morphine sulfate controlled ?release tablets* (generic MS Contin) (date of trial must be provided). ? ?Zella Ball has letter on her desk. ?

## 2021-08-31 ENCOUNTER — Ambulatory Visit (INDEPENDENT_AMBULATORY_CARE_PROVIDER_SITE_OTHER): Payer: 59 | Admitting: Neurology

## 2021-08-31 ENCOUNTER — Telehealth: Payer: Self-pay | Admitting: Registered Nurse

## 2021-08-31 ENCOUNTER — Telehealth: Payer: Self-pay | Admitting: *Deleted

## 2021-08-31 DIAGNOSIS — G2581 Restless legs syndrome: Secondary | ICD-10-CM

## 2021-08-31 DIAGNOSIS — Z9884 Bariatric surgery status: Secondary | ICD-10-CM

## 2021-08-31 DIAGNOSIS — G4719 Other hypersomnia: Secondary | ICD-10-CM

## 2021-08-31 DIAGNOSIS — G4733 Obstructive sleep apnea (adult) (pediatric): Secondary | ICD-10-CM | POA: Diagnosis not present

## 2021-08-31 DIAGNOSIS — G4713 Recurrent hypersomnia: Secondary | ICD-10-CM

## 2021-08-31 DIAGNOSIS — E161 Other hypoglycemia: Secondary | ICD-10-CM

## 2021-08-31 DIAGNOSIS — R42 Dizziness and giddiness: Secondary | ICD-10-CM

## 2021-08-31 DIAGNOSIS — G4753 Recurrent isolated sleep paralysis: Secondary | ICD-10-CM

## 2021-08-31 NOTE — Telephone Encounter (Signed)
-----   Message from Star Age, MD sent at 08/31/2021  2:03 PM EDT ----- ?Please call patient and advise her that her carotid Doppler ultrasound showed no significant blockages or hardening of the arteries.  She was found to have thyroid nodules.  I recommended follow-up with her primary care to discuss a designated thyroid study such as an ultrasound. ?

## 2021-08-31 NOTE — Telephone Encounter (Signed)
Placed a call to Ms. Belanger, no answer.  ?Left message to return the call.  ?

## 2021-08-31 NOTE — Telephone Encounter (Signed)
Called pt and advised of results. Informed her to follow up with PCP regarding thyroid nodules. Pt verbalized understanding and expressed appreciation for the call. All questions answered. ?

## 2021-09-01 ENCOUNTER — Telehealth: Payer: Self-pay

## 2021-09-01 MED ORDER — NORTRIPTYLINE HCL 25 MG PO CAPS: 25.0000 mg | ORAL_CAPSULE | Freq: Every day | ORAL | 0 refills | Status: DC

## 2021-09-01 NOTE — Telephone Encounter (Signed)
Patient was informed about Nucynta ER being denied and the insurance company wanting her to try Nortriptyline, Amitriptyline, or Pamelor. She has questions about those medications because she has never heard of them. She would like a return call back from Danella Sensing, NP. Asked if she has talked with Nephrology. She stated she called them twice with no answer and went by there Thursday at 4:45 pm and they were already closed. Please call patient. ?

## 2021-09-01 NOTE — Telephone Encounter (Signed)
Return Ms. Zanni call ,  ?She is aware her Nucynta ER was denied. She will be prescribed Pamelor, reviewed serotonin syndrome, she verbalizes understanding.  ?She will be performing a 24 hour urine clearance on Sunday 09/06/2021, nephrology following. Continue with slow weaning of gabapentin, as we await the results of the 24 hour urine clearance, she verbalizes understanding.  ?This provider also spoke with pharmacist at [facility] regarding the above, all questions answered.  ?

## 2021-09-02 NOTE — Progress Notes (Signed)
? ? ?  ?  ?[facility] ?  ?HOME SLEEP TEST REPORT ( by Watch PAT)   ?STUDY DATA:  09-02-2021 ? ?  ?ORDERING CLINICIAN: Larey Seat, MD  ?REFERRING CLINICIAN:  ?  ?CLINICAL INFORMATION/HISTORY: Sabrina Williams is a 51 year-old  female patient who was seen upon referral on 08/13/2021 from Dr Krista Blue to help her get a sleep study and evaluation prior to a foot surgery.  ?She reports postprandial BP dropping-  for a period of 30 minutes after a meal , heavier meals. She has had a gastric sleeve surgery in the past. Not gastric Bypass. Gave up driving after meal time. ?She has seasonal allergies and is on heavy pain medications,too.  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 ?Chief concern according to patient :  " I am nearly fainting after a meal, and go sleep" . ? Sleep relevant medical history: Snoring, foot pain at night, had plantar fasciitis in 2015, became tendonitis after a previous surgery, had chronic pain.  Morbid obesity, ( BMI 42)  status post sleeve ( not gastric bypass). She has all implanted teeth and a small mouth, her jaw alignment is affected.  ?  ?Epworth sleepiness score: 13/24. ?BMI: 42.6 kg/m? ?Neck Circumference: 16" ?  ?FINDINGS: ?  ?Sleep Summary: ?  ?Total Recording Time (hours, min):  7 hours and 21 inutes    ?  ?Total Sleep Time (hours, min):   5 h and 36 m            ?  ?Percent REM (%):  26%                                    ?  ?Respiratory Indices: ?  ?Calculated pAHI (per hour):   12.5/h                        ?  ?REM pAHI:   18.5/h                                            ?  ?NREM pAHI: 11.9/h                           ?  ?Positional AHI:   Supine sleep was associated with an AHI of 18.5/h and left sided sleep with an AHI of 5.1/h.     ? ?Snoring was moderate with a mean Volume of 41 dB and 24% of sleep time accompanied by snoring.                                          ?  ?Oxygen Saturation Statistics: ?  ?O2 Saturation Range (%):  between a  nadir of 90% and maximum 02 saturation of 100%, mean 02 was 95%                                   ?  ?O2 Saturation (minutes) <89%:    0 minutes      ?  ?Pulse Rate Statistics:      ?  ?  Pulse Range:    from 45 to 89 bpm.            ?  ?IMPRESSION:  This HST confirms the presence of  mild obstructive sleep apnea which is associated with/ dependent on supine sleep. ?  ?RECOMMENDATION:  ?1) Avoiding supine sleep ! ?2) work on further weight loss, it will help reduce snoring and apnea.  ? 3) Auto CPAP will treat snoring and REM accentuated apnea , setting from 5-15 cm water, 2 cm EPR and heated humidification.  ?  ?INTERPRETING PHYSICIAN: ? ? Larey Seat, MD  ? ?Medical Director of [facility].  ? ? ? ? ? ? ? ? ? ? ? ? ? ? ? ? ? ? ? ? ?

## 2021-09-03 DIAGNOSIS — R42 Dizziness and giddiness: Secondary | ICD-10-CM | POA: Insufficient documentation

## 2021-09-03 DIAGNOSIS — G4753 Recurrent isolated sleep paralysis: Secondary | ICD-10-CM | POA: Insufficient documentation

## 2021-09-03 DIAGNOSIS — G4713 Recurrent hypersomnia: Secondary | ICD-10-CM | POA: Insufficient documentation

## 2021-09-03 DIAGNOSIS — Z9884 Bariatric surgery status: Secondary | ICD-10-CM | POA: Insufficient documentation

## 2021-09-03 LAB — COMPREHENSIVE METABOLIC PANEL
ALT: 27 IU/L (ref 0–32)
AST: 25 IU/L (ref 0–40)
Albumin/Globulin Ratio: 1.6 (ref 1.2–2.2)
Albumin: 3.9 g/dL (ref 3.8–4.9)
Alkaline Phosphatase: 151 IU/L — ABNORMAL HIGH (ref 44–121)
BUN/Creatinine Ratio: 17 (ref 9–23)
BUN: 44 mg/dL — ABNORMAL HIGH (ref 6–24)
Bilirubin Total: <0.2 mg/dL (ref 0.0–1.2)
CO2: 12 mmol/L — ABNORMAL LOW (ref 20–29)
Calcium: 8.7 mg/dL (ref 8.7–10.2)
Chloride: 109 mmol/L — ABNORMAL HIGH (ref 96–106)
Creatinine, Ser: 2.65 mg/dL — ABNORMAL HIGH (ref 0.57–1.00)
Globulin, Total: 2.4 g/dL (ref 1.5–4.5)
Glucose: 133 mg/dL — ABNORMAL HIGH (ref 70–99)
Potassium: 4.1 mmol/L (ref 3.5–5.2)
Sodium: 143 mmol/L (ref 134–144)
Total Protein: 6.3 g/dL (ref 6.0–8.5)
eGFR: 21 mL/min/{1.73_m2} — ABNORMAL LOW (ref >59)

## 2021-09-03 NOTE — Addendum Note (Signed)
Addended by: Larey Seat on: 09/03/2021 05:01 PM ? ? Modules accepted: Orders ? ?

## 2021-09-03 NOTE — Procedures (Signed)
? ?  ?  ?[facility] ?  ?HOME SLEEP TEST REPORT ( by Watch PAT)   ?STUDY DATA:  09-02-2021 ? ?  ?ORDERING CLINICIAN: Larey Seat, MD  ?REFERRING CLINICIAN: Marcial Pacas, MD, PhD ?  ?CLINICAL INFORMATION/HISTORY: Sabrina Williams is a 51 year-old  female patient who was seen upon referral on 08/13/2021 from Dr Krista Blue to help her get a sleep study and evaluation prior to a foot surgery.  ?She reports postprandial BP dropping-  for a period of 30 minutes after a meal , heavier meals. She has had a gastric sleeve surgery in the past. Not gastric Bypass. Gave up driving after meal time. ?She has seasonal allergies and is on heavy pain medications,too.  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 ?Chief concern according to patient :  " I am nearly fainting after a meal, and go sleep" . ? Sleep relevant medical history: Snoring, foot pain at night, had plantar fasciitis in 2015, became tendonitis after a previous surgery, had chronic pain.  Morbid obesity, ( BMI 42)  status post sleeve ( not gastric bypass). She has all implanted teeth and a small mouth, her jaw alignment is affected.  ?  ?Epworth sleepiness score: 13/24. ?BMI: 42.6 kg/m? ?Neck Circumference: 16" ?  ?FINDINGS: ?  ?Sleep Summary: ?  ?Total Recording Time (hours, min):  7 hours and 21 inutes    ?  ?Total Sleep Time (hours, min):   5 h and 36 m            ?  ?Percent REM (%):  26%                                    ?  ?Respiratory Indices: ?  ?Calculated pAHI (per hour):   12.5/h                        ?  ?REM pAHI:   18.5/h                                            ?  ?NREM pAHI: 11.9/h                           ?  ?Positional AHI:   Supine sleep was associated with an AHI of 18.5/h and left sided sleep with an AHI of 5.1/h.     ? ?Snoring was moderate with a mean Volume of 41 dB and 24% of sleep time accompanied by snoring.                                          ?  ?Oxygen Saturation Statistics: ?  ?O2 Saturation Range  (%):  between a nadir of 90% and maximum 02 saturation of 100%, mean 02 was 95%                                   ?  ?O2 Saturation (minutes) <89%:    0 minutes      ?  ?Pulse Rate  Statistics:      ?  ?Pulse Range:    from 45 to 89 bpm.            ?  ?IMPRESSION:  This HST confirms the presence of  mild obstructive sleep apnea which is associated with/ dependent on supine sleep. ?  ?RECOMMENDATION:  ?1) Avoiding supine sleep ! ?2) work on further weight loss, it will help reduce snoring and apnea.  ? 3) Auto CPAP will treat snoring and REM accentuated apnea , setting from 5-15 cm water, 2 cm EPR and heated humidification.  ?  ?INTERPRETING PHYSICIAN: ? ? Larey Seat, MD  ? ?Medical Director of [facility].  ? ? ? ? ? ? ? ? ? ? ? ? ? ? ? ? ? ? ? ?

## 2021-09-03 NOTE — Progress Notes (Signed)
IMPRESSION:  This HST confirms the presence of mild obstructive sleep apnea which is associated with/ dependent on supine sleep. There was no hypoxemia noted.  ?? ?RECOMMENDATION:  ?1) Avoiding supine sleep ! ?2) work on further weight loss, it will help reduce snoring and apnea.  ? 3) ResMed Auto CPAP will treat snoring and REM accentuated apnea , setting from 5-15 cm water, 2 cm EPR and heated humidification

## 2021-09-04 ENCOUNTER — Telehealth: Payer: Self-pay | Admitting: Registered Nurse

## 2021-09-04 NOTE — Telephone Encounter (Signed)
CMP results was reviewed.  ?Sabrina Williams will be starting her 24 hour urine clearance on 09/06/2021, she was encouraged to obtain an appointment with her nephrologist.  ?We are continuing with slow weaning of her Gabapentin. Goal for the weak is to decrease her Gabapentin to 3 times a day, she is currently taking it 4 times a day.  ?We will continue to monitor.  ?

## 2021-09-07 ENCOUNTER — Telehealth: Payer: Self-pay | Admitting: *Deleted

## 2021-09-07 NOTE — Telephone Encounter (Signed)
LVM for pt to call about results. °

## 2021-09-07 NOTE — Telephone Encounter (Signed)
-----   Message from Larey Seat, MD sent at 09/03/2021  5:01 PM EDT ----- ?IMPRESSION:  This HST confirms the presence of mild obstructive sleep apnea which is associated with/ dependent on supine sleep. There was no hypoxemia noted.  ?? ?RECOMMENDATION:  ?1) Avoiding supine sleep ! ?2) work on further weight loss, it will help reduce snoring and apnea.  ? 3) ResMed Auto CPAP will treat snoring and REM accentuated apnea , setting from 5-15 cm water, 2 cm EPR and heated humidification ?

## 2021-09-08 ENCOUNTER — Encounter: Payer: Self-pay | Admitting: Neurology

## 2021-09-17 ENCOUNTER — Telehealth: Payer: Self-pay | Admitting: Registered Nurse

## 2021-09-17 MED ORDER — NORTRIPTYLINE HCL 75 MG PO CAPS: 75.0000 mg | ORAL_CAPSULE | Freq: Every day | ORAL | 1 refills | Status: DC

## 2021-09-17 NOTE — Telephone Encounter (Signed)
Patient was told by Sabrina Williams to call her about medication that she had taken her down on.  Please call her asap.  Thank you. ?

## 2021-09-17 NOTE — Telephone Encounter (Signed)
Return Sabrina Williams call,  ?Reviewed Dr Holley Raring note: Nephrology  ?Spoke with Dr Letta Pate regarding Sabrina Williams urine clearance.  ?Her gabapentin is being weaned slowly.  ?Starting on 09/18/2021  ?She will take her Gabapentin 600 mg in the morning, 300 mg in the late afternoon and Gabapentin 600 mg at bedtime.  ?Spoke with pharmacist at [facility]:  ?Pamelor changed to 75 mg at bedtime.  ?Sabrina Williams sent message to Dr Holley Raring regarding any advice as it relates to her narcotics for pain control, she is awaiting a response.  ?Sabrina Williams Tapentadol will be discontinued and Oxycodone will be prescribed next month. Oxycodone 10 mg QID, per Dr Letta Pate instructions.Marland Kitchen  ?

## 2021-09-17 NOTE — Telephone Encounter (Signed)
Return Ms. Hoaglund Call. ?Reviewed Nephrologist Note: Dr Holley Raring ?

## 2021-09-17 NOTE — Addendum Note (Signed)
Addended by: Bayard Hugger on: 09/17/2021 04:27 PM ? ? Modules accepted: Orders ? ?

## 2021-09-23 ENCOUNTER — Encounter: Payer: 59 | Attending: Physical Medicine & Rehabilitation | Admitting: Registered Nurse

## 2021-09-23 ENCOUNTER — Encounter: Payer: Self-pay | Admitting: Registered Nurse

## 2021-09-23 VITALS — BP 99/69 | HR 102 | Ht 67.0 in | Wt 274.0 lb

## 2021-09-23 DIAGNOSIS — Z79891 Long term (current) use of opiate analgesic: Secondary | ICD-10-CM | POA: Diagnosis present

## 2021-09-23 DIAGNOSIS — G90521 Complex regional pain syndrome I of right lower limb: Secondary | ICD-10-CM | POA: Insufficient documentation

## 2021-09-23 DIAGNOSIS — M1711 Unilateral primary osteoarthritis, right knee: Secondary | ICD-10-CM | POA: Insufficient documentation

## 2021-09-23 DIAGNOSIS — M546 Pain in thoracic spine: Secondary | ICD-10-CM | POA: Diagnosis present

## 2021-09-23 DIAGNOSIS — G894 Chronic pain syndrome: Secondary | ICD-10-CM | POA: Insufficient documentation

## 2021-09-23 DIAGNOSIS — M542 Cervicalgia: Secondary | ICD-10-CM | POA: Diagnosis present

## 2021-09-23 DIAGNOSIS — M79671 Pain in right foot: Secondary | ICD-10-CM | POA: Diagnosis present

## 2021-09-23 DIAGNOSIS — M5412 Radiculopathy, cervical region: Secondary | ICD-10-CM | POA: Diagnosis present

## 2021-09-23 DIAGNOSIS — Z5181 Encounter for therapeutic drug level monitoring: Secondary | ICD-10-CM | POA: Diagnosis present

## 2021-09-23 DIAGNOSIS — G8929 Other chronic pain: Secondary | ICD-10-CM | POA: Diagnosis present

## 2021-09-23 MED ORDER — OXYCODONE HCL 10 MG PO TABS: 10.0000 mg | ORAL_TABLET | Freq: Four times a day (QID) | ORAL | 0 refills | Status: DC | PRN

## 2021-09-23 NOTE — Patient Instructions (Addendum)
Send My Chart Message on Friday afternoon and  in a week with update on Medication Change.  ? ?

## 2021-09-23 NOTE — Progress Notes (Signed)
? ?Subjective:  ? ? Patient ID: Sabrina Williams, female    DOB: 01/19/1971, 51 y.o.   MRN: [id number] ? ?HPI: Sabrina Williams is a 51 y.o. female who returns for follow up appointment for chronic pain and medication refill. Sabrina Williams states her pain is located in her neck radiating into her left shoulder, left arm with tingling and burning. Sabrina Williams also reports Mid- back pain mainly left side, right knee pain and right foot pain. Sabrina Williams rates her pain 2. Her current exercise regime is walking short distances.  ? ?Sabrina Williams is tolerating the slow weaning of Gabapentin, regarding her urine clearances. Nephrology following urine clearance. We will check BMP in three months, Sabrina Williams verbalizes understanding.  ?Gabapentin this week is 300 mg in morning, lunch 600 mg at dinner and 300 mg at bedtime.  ?Today's Gabentin changed will be Gabapentin 300 mg 4 times a day, the following week will be Gabapentin 300 mg three times a day, Sabrina Williams verbalizes understanding.  ? ?Her Tapentadol was discontinued today due to urine clearance, we prescribed Oxycodone 10 mg QID, this was discussed with Dr Kirsteins, see notes for details. Sabrina Williams will send a MY Chart message on Friday and next week with update on medication change, Sabrina Williams verbalizes understanding.  ? ?Sabrina Williams Morphine equivalent is 160.00 MME.   Last UDS was Performed on 06/24/2021, it was consistent.  ?  ? ? ?Pain Inventory ?Average Pain 4 ?Pain Right Now 2 ?My pain is constant, sharp, burning, dull, stabbing, tingling, and aching ? ?In the last 24 hours, has pain interfered with the following? ?General activity 8 ?Relation with others 6 ?Enjoyment of life 7 ?What TIME of day is your pain at its worst? evening ?Sleep (in general) Fair ? ?Pain is worse with: walking, standing, and some activites ?Pain improves with: rest, therapy/exercise, and medication ?Relief from Meds: 5 ? ?Family History  ?Problem Relation Age of Onset  ? Cancer Mother   ?     multiple myeloma  ? Diabetes Father    ? Kidney disease Father   ? Heart disease Father   ? ?Social History  ? ?Socioeconomic History  ? Marital status: Married  ?  Spouse name: Not on file  ? Number of children: 2  ? Years of education: some college  ? Highest education level: Not on file  ?Occupational History  ? Occupation: homemaker  ?Tobacco Use  ? Smoking status: Never  ? Smokeless tobacco: Never  ?Vaping Use  ? Vaping Use: Never used  ?Substance and Sexual Activity  ? Alcohol use: No  ?  Alcohol/week: 0.0 standard drinks  ? Drug use: No  ? Sexual activity: Not on file  ?Other Topics Concern  ? Not on file  ?Social History Narrative  ? Lives at home with husband.  ? Right-handed.  ? No daily caffeine use.  ? ?Social Determinants of Health  ? ?Financial Resource Strain: Not on file  ?Food Insecurity: Not on file  ?Transportation Needs: Not on file  ?Physical Activity: Not on file  ?Stress: Not on file  ?Social Connections: Not on file  ? ?Past Surgical History:  ?Procedure Laterality Date  ? CERVICAL SPINE SURGERY    ? C6-7 fusion  ? CESAREAN SECTION    ? x2  ? dental implant  July 2015  ? ENDOSCOPIC PLANTAR FASCIOTOMY Right August 2015  ? GASTRIC BYPASS    ? HAND SURGERY Right   ? LITHOTRIPSY    ? multiple  ? PLANTAR   FASCIECTOMY Right   ? SHOULDER SURGERY Left 10/2004  ? SMALL INTESTINE SURGERY    ? cervical laminectomy  ? utereroscopy  1995  ? ?Past Surgical History:  ?Procedure Laterality Date  ? CERVICAL SPINE SURGERY    ? C6-7 fusion  ? CESAREAN SECTION    ? x2  ? dental implant  July 2015  ? ENDOSCOPIC PLANTAR FASCIOTOMY Right August 2015  ? GASTRIC BYPASS    ? HAND SURGERY Right   ? LITHOTRIPSY    ? multiple  ? PLANTAR FASCIECTOMY Right   ? SHOULDER SURGERY Left 10/2004  ? SMALL INTESTINE SURGERY    ? cervical laminectomy  ? utereroscopy  1995  ? ?Past Medical History:  ?Diagnosis Date  ? Anxiety   ? Chronic kidney disease   ? stones  ? Chronic pain   ? Complex regional pain syndrome I   ? right foot  ? Depression   ? Diabetes mellitus   ?  Hypertension   ? Kidney stone   ? Neuromuscular disorder (HCC)   ? Tremor   ? ?There were no vitals taken for this visit. ? ?Opioid Risk Score:   ?Fall Risk Score:  `1 ? ?Depression screen PHQ 2/9 ? ? ?  08/25/2021  ? 11:15 AM 06/24/2021  ? 11:27 AM 05/26/2021  ?  1:11 PM 02/23/2021  ? 12:09 PM 12/31/2020  ?  9:27 AM 11/26/2020  ? 11:21 AM 09/29/2020  ? 10:37 AM  ?Depression screen PHQ 2/9  ?Decreased Interest 0 0 1 0 0 0 0  ?Down, Depressed, Hopeless 0 0 1 0 0 0 0  ?PHQ - 2 Score 0 0 2 0 0 0 0  ? ? ?Review of Systems  ?Musculoskeletal:  Positive for back pain and gait problem.  ?     Left arm pain, right knee pain, right foot pain  ?All other systems reviewed and are negative. ? ?   ?Objective:  ? Physical Exam ?Vitals and nursing note reviewed.  ?Constitutional:   ?   Appearance: Normal appearance.  ?Cardiovascular:  ?   Rate and Rhythm: Normal rate and regular rhythm.  ?   Pulses: Normal pulses.  ?   Heart sounds: Normal heart sounds.  ?Pulmonary:  ?   Effort: Pulmonary effort is normal.  ?   Breath sounds: Normal breath sounds.  ?Musculoskeletal:  ?   Cervical back: Normal range of motion and neck supple.  ?   Comments: Normal Muscle Bulk and Muscle Testing Reveals:  ?Upper Extremities: Full ROM and Muscle Strength 5/5 ?Left AC Joint Tenderness ? Lower Extremities : Full ROM and Muscle Strength 5/5 ?Arises from Table slowly ?Narrow Based Gait  ?   ?Skin: ?   General: Skin is warm and dry.  ?Neurological:  ?   Mental Status: Sabrina Williams is alert and oriented to person, place, and time.  ?Psychiatric:     ?   Mood and Affect: Mood normal.     ?   Behavior: Behavior normal.  ? ? ? ? ?   ?Assessment & Plan:  ?1.Complex regional pain syndrome right lower extremity postoperative. Sabrina Williams's s/p post plantar fascial release. Sabrina Williams continue with sensitivity to touch. Sabrina Williams has diffuse numbness and tingling.Gabapentin being weaned slowly . 09/23/2021. ?Nucynta discontinued due to Urine Clearance. RX: Oxycodone 10 mg 4 times a day as needed for pain  #120, .We will continue the opioid monitoring program, this consists of regular clinic visits, examinations, urine drug screen, pill counts as well as   use of [place] Controlled Substance Reporting system. A 12 month History has been reviewed on the [place] Controlled Substance Reporting System on 09/23/2021. ?2.Myofascial pain syndrome: Continue current medication regime with Tizanidine and  ice, heat and exercise regime. 09/23/2021 ?3. Depression: Continue: Zoloft. PCP Following. 09/23/2021 ?4. Cervical Post Laminectomy: Continue to Monitor. 09/23/2021 ?5. Cervicalgia/Cervical  Radiculopathy/ Left shoulder, Left Arm and Left  Hand Pain with tingling,  Continue current medication regimen. Continue to monitor. 09/23/2021 ?6. Poly Neuropathy: Continue current medication regimen and :Continue to Monitor. 09/23/2021 ?7. Left Shoulder Tendonitis: Chronic Left Shoulder Pain:  Ortho Following. Continue current medication regime and Continue  to Monitor. 09/23/2021 ?8. Right hand pain/ Post Surgical  Procedure: Trigger Finger Release: Dr. Jerilee Hoh Following.  No complaints Today. 09/23/2021. ?9. Tremor: No complaints today. Gabapentin being weaned slowly. Continue to monitor.  05/03//2023 ?10. Right   Knee Pain: Orthopedics Following: Continue with HEP as Tolerated. Continue to Monitor. 05/03/ 2023 ?11. Chronic Bilateral  Feet Pain. Podiatry Following.Continue with HEP as tolerated. Continue current medication regimen. Continue to monitor. 09/23/2021 ? 12. Left Greater Trochanter Bursitis: No complaints today. Continue to alternate Ice and Heat Therapy. Continue to Monitor. 09/23/2021 ?  ?  ? F/U in 1 month ?  ? ? ? ? ? ? ? ? ? ? ?

## 2021-09-29 ENCOUNTER — Institutional Professional Consult (permissible substitution): Payer: 59 | Admitting: Neurology

## 2021-10-10 ENCOUNTER — Other Ambulatory Visit: Payer: Self-pay | Admitting: Registered Nurse

## 2021-10-12 MED ORDER — ONDANSETRON HCL 4 MG PO TABS: 4.0000 mg | ORAL_TABLET | Freq: Two times a day (BID) | ORAL | 1 refills | Status: DC | PRN

## 2021-10-12 MED ORDER — GABAPENTIN 100 MG PO CAPS: ORAL_CAPSULE | ORAL | 0 refills | Status: DC

## 2021-10-12 NOTE — Telephone Encounter (Signed)
Gabapentin e- scribed today.  [name] is taking Gabapentin 1/2 tablet ( 300 mg) twice a day.  Gabapentin 100 mg capsules: Take three capsules twice a day .  Our next weaned: Gabapentin 200 mg in the am and 300 mg at bedtime.  [name] verbalizes understanding.

## 2021-10-22 ENCOUNTER — Encounter: Payer: 59 | Attending: Physical Medicine & Rehabilitation | Admitting: Registered Nurse

## 2021-10-22 ENCOUNTER — Encounter: Payer: Self-pay | Admitting: Registered Nurse

## 2021-10-22 VITALS — BP 102/72 | HR 90 | Ht 67.0 in | Wt 272.8 lb

## 2021-10-22 DIAGNOSIS — G8929 Other chronic pain: Secondary | ICD-10-CM | POA: Diagnosis present

## 2021-10-22 DIAGNOSIS — G90521 Complex regional pain syndrome I of right lower limb: Secondary | ICD-10-CM | POA: Diagnosis present

## 2021-10-22 DIAGNOSIS — M1711 Unilateral primary osteoarthritis, right knee: Secondary | ICD-10-CM | POA: Insufficient documentation

## 2021-10-22 DIAGNOSIS — Z79891 Long term (current) use of opiate analgesic: Secondary | ICD-10-CM | POA: Insufficient documentation

## 2021-10-22 DIAGNOSIS — Z5181 Encounter for therapeutic drug level monitoring: Secondary | ICD-10-CM | POA: Insufficient documentation

## 2021-10-22 DIAGNOSIS — M79671 Pain in right foot: Secondary | ICD-10-CM | POA: Insufficient documentation

## 2021-10-22 DIAGNOSIS — G894 Chronic pain syndrome: Secondary | ICD-10-CM | POA: Diagnosis present

## 2021-10-22 DIAGNOSIS — N184 Chronic kidney disease, stage 4 (severe): Secondary | ICD-10-CM | POA: Diagnosis present

## 2021-10-22 DIAGNOSIS — M5412 Radiculopathy, cervical region: Secondary | ICD-10-CM | POA: Diagnosis present

## 2021-10-22 DIAGNOSIS — M542 Cervicalgia: Secondary | ICD-10-CM | POA: Insufficient documentation

## 2021-10-22 MED ORDER — OXYCODONE HCL 10 MG PO TABS: 10.0000 mg | ORAL_TABLET | Freq: Four times a day (QID) | ORAL | 0 refills | Status: DC | PRN

## 2021-10-22 NOTE — Progress Notes (Signed)
Subjective:    Patient ID: Sabrina Williams, female    DOB: Jun 13, 1970, 51 y.o.   MRN: [id number]  HPI: Sabrina Williams is a 51 y.o. female who returns for follow up appointment for chronic pain and medication refill. She states her  pain is located in her neck radiating into her left shoulder, right knee pain and right foot pain. She also reports right elbow pain radiating into her right forearm, she describes the pain as achy pain . She rates her pain 3. Her current exercise regime is walking and performing stretching exercises.  Sabrina Williams Morphine equivalent is 60.00 MME.   Last UDS was Performed on 06/24/2021, it was consistent.     Pain Inventory Average Pain 5 Pain Right Now 3 My pain is constant, sharp, burning, dull, stabbing, and aching  In the last 24 hours, has pain interfered with the following? General activity 8 Relation with others 6 Enjoyment of life 8 What TIME of day is your pain at its worst? evening Sleep (in general) Fair  Pain is worse with: walking, standing, and some activites Pain improves with: rest, heat/ice, therapy/exercise, and medication Relief from Meds: 5  Family History  Problem Relation Age of Onset   Cancer Mother        multiple myeloma   Diabetes Father    Kidney disease Father    Heart disease Father    Social History   Socioeconomic History   Marital status: Married    Spouse name: Not on file   Number of children: 2   Years of education: some college   Highest education level: Not on file  Occupational History   Occupation: homemaker  Tobacco Use   Smoking status: Never   Smokeless tobacco: Never  Vaping Use   Vaping Use: Never used  Substance and Sexual Activity   Alcohol use: No    Alcohol/week: 0.0 standard drinks   Drug use: No   Sexual activity: Not on file  Other Topics Concern   Not on file  Social History Narrative   Lives at home with husband.   Right-handed.   No daily caffeine use.   Social  Determinants of Health   Financial Resource Strain: Not on file  Food Insecurity: Not on file  Transportation Needs: Not on file  Physical Activity: Not on file  Stress: Not on file  Social Connections: Not on file   Past Surgical History:  Procedure Laterality Date   CERVICAL SPINE SURGERY     C6-7 fusion   CESAREAN SECTION     x2   dental implant  July 2015   ENDOSCOPIC PLANTAR FASCIOTOMY Right August 2015   GASTRIC BYPASS     HAND SURGERY Right    LITHOTRIPSY     multiple   PLANTAR FASCIECTOMY Right    SHOULDER SURGERY Left 10/2004   SMALL INTESTINE SURGERY     cervical laminectomy   utereroscopy  1995   Past Surgical History:  Procedure Laterality Date   CERVICAL SPINE SURGERY     C6-7 fusion   CESAREAN SECTION     x2   dental implant  July 2015   ENDOSCOPIC PLANTAR FASCIOTOMY Right August 2015   GASTRIC BYPASS     HAND SURGERY Right    LITHOTRIPSY     multiple   PLANTAR FASCIECTOMY Right    SHOULDER SURGERY Left 10/2004   SMALL INTESTINE SURGERY     cervical laminectomy   utereroscopy  1995  Past Medical History:  Diagnosis Date   Anxiety    Chronic kidney disease    stones   Chronic pain    Complex regional pain syndrome I    right foot   Depression    Diabetes mellitus    Hypertension    Kidney stone    Neuromuscular disorder (HCC)    Tremor    BP 102/72   Pulse 90   Ht $R'5\' 7"'qO$  (1.702 m)   Wt 272 lb 12.8 oz (123.7 kg)   SpO2 96%   BMI 42.73 kg/m   Opioid Risk Score:   Fall Risk Score:  `1  Depression screen [facility] 2/9     10/22/2021   11:28 AM 09/23/2021   11:53 AM 08/25/2021   11:15 AM 06/24/2021   11:27 AM 05/26/2021    1:11 PM 02/23/2021   12:09 PM 12/31/2020    9:27 AM  Depression screen PHQ 2/9  Decreased Interest 0 1 0 0 1 0 0  Down, Depressed, Hopeless 0 1 0 0 1 0 0  PHQ - 2 Score 0 2 0 0 2 0 0     Review of Systems  Constitutional: Negative.   HENT: Negative.    Eyes: Negative.   Respiratory: Negative.    Cardiovascular:  Negative.   Gastrointestinal: Negative.   Endocrine: Negative.   Genitourinary: Negative.   Musculoskeletal:  Positive for neck pain.  Skin: Negative.   Allergic/Immunologic: Negative.   Neurological: Negative.   Hematological: Negative.   Psychiatric/Behavioral: Negative.        Objective:   Physical Exam Vitals and nursing note reviewed.  Constitutional:      Appearance: Normal appearance. She is obese.  Cardiovascular:     Rate and Rhythm: Normal rate and regular rhythm.     Pulses: Normal pulses.     Heart sounds: Normal heart sounds.  Pulmonary:     Effort: Pulmonary effort is normal.     Breath sounds: Normal breath sounds.  Musculoskeletal:     Cervical back: Normal range of motion and neck supple.     Comments: Normal Muscle Bulk and Muscle Testing Reveals:  Upper Extremities:Full  ROM and Muscle Strength  5/5  Left AC Joint Tenderness  Lower Extremities: Full ROM and Muscle Strength 5/5 Arises From Chair wit Ease  Narrow Based Gait     Skin:    General: Skin is warm and dry.  Neurological:     Mental Status: She is alert and oriented to person, place, and time.  Psychiatric:        Mood and Affect: Mood normal.        Behavior: Behavior normal.         Assessment & Plan:  1.Complex regional pain syndrome right lower extremity postoperative. She's s/p post plantar fascial release. She continue with sensitivity to touch. She has diffuse numbness and tingling.Gabapentin being weaned slowly . 10/22/2021. Nucynta discontinued due to Urine Clearance. RX: Oxycodone 10 mg 4 times a day as needed for pain #120, .We will continue the opioid monitoring program, this consists of regular clinic visits, examinations, urine drug screen, pill counts as well as use of [place] Controlled Substance Reporting system. A 12 month History has been reviewed on the [place] Controlled Substance Reporting System on 10/22/2021. 2.Myofascial pain syndrome: Continue current  medication regime with Tizanidine and  ice, heat and exercise regime. 10/22/2021 3. Depression: Continue: Zoloft. PCP Following. 10/22/2021 4. Cervical Post Laminectomy: Continue to Monitor. 10/22/2021 5.  Cervicalgia/Cervical  Radiculopathy/ Left shoulder, Left Arm and Left  Hand Pain with tingling,  Continue current medication regimen. Continue to monitor. 10/22/2021 6. Poly Neuropathy: Continue current medication regimen and :Continue to Monitor. 10/22/2021 7. Left Shoulder Tendonitis: Chronic Left Shoulder Pain:  Ortho Following. Continue current medication regime and Continue  to Monitor. 10/22/2021 8. Right hand pain/ Post Surgical  Procedure: Trigger Finger Release: Dr. Jerilee Hoh Following.  No complaints Today. 10/22/2021. 9. Tremor: No complaints today. Gabapentin being weaned slowly. Continue to monitor.  06/01//2023 10. Right   Knee Pain: Orthopedics Following: Continue with HEP as Tolerated. Continue to Monitor. 06/01/ 2023 11. Chronic Bilateral  Feet Pain. Podiatry Following.Continue with HEP as tolerated. Continue current medication regimen. Continue to monitor. 10/22/2021  12. Left Greater Trochanter Bursitis: No complaints today. Continue to alternate Ice and Heat Therapy. Continue to Monitor. 10/22/2021      F/U in 1 month

## 2021-10-29 LAB — COMPREHENSIVE METABOLIC PANEL
ALT: 21 IU/L (ref 0–32)
AST: 18 IU/L (ref 0–40)
Albumin/Globulin Ratio: 1.7 (ref 1.2–2.2)
Albumin: 3.9 g/dL (ref 3.8–4.9)
Alkaline Phosphatase: 138 IU/L — ABNORMAL HIGH (ref 44–121)
BUN/Creatinine Ratio: 16 (ref 9–23)
BUN: 38 mg/dL — ABNORMAL HIGH (ref 6–24)
Bilirubin Total: <0.2 mg/dL (ref 0.0–1.2)
CO2: 17 mmol/L — ABNORMAL LOW (ref 20–29)
Calcium: 9.1 mg/dL (ref 8.7–10.2)
Chloride: 109 mmol/L — ABNORMAL HIGH (ref 96–106)
Creatinine, Ser: 2.43 mg/dL — ABNORMAL HIGH (ref 0.57–1.00)
Globulin, Total: 2.3 g/dL (ref 1.5–4.5)
Glucose: 168 mg/dL — ABNORMAL HIGH (ref 70–99)
Potassium: 4.8 mmol/L (ref 3.5–5.2)
Sodium: 141 mmol/L (ref 134–144)
Total Protein: 6.2 g/dL (ref 6.0–8.5)
eGFR: 23 mL/min/{1.73_m2} — ABNORMAL LOW (ref >59)

## 2021-10-29 LAB — GABAPENTIN LEVEL: Gabapentin Lvl: 8.6 ug/mL (ref 4.0–16.0)

## 2021-11-07 ENCOUNTER — Other Ambulatory Visit: Payer: Self-pay | Admitting: Registered Nurse

## 2021-11-18 ENCOUNTER — Encounter: Payer: Self-pay | Admitting: Emergency Medicine

## 2021-11-18 ENCOUNTER — Other Ambulatory Visit: Payer: Self-pay

## 2021-11-18 ENCOUNTER — Ambulatory Visit
Admission: EM | Admit: 2021-11-18 | Discharge: 2021-11-18 | Disposition: A | Discharge status: Home / Self Care | Payer: 59 | Attending: Emergency Medicine | Admitting: Emergency Medicine

## 2021-11-18 DIAGNOSIS — M545 Low back pain, unspecified: Secondary | ICD-10-CM | POA: Diagnosis not present

## 2021-11-18 MED ORDER — PREDNISONE 20 MG PO TABS: 40.0000 mg | ORAL_TABLET | Freq: Every day | ORAL | 0 refills | Status: DC

## 2021-11-18 MED ORDER — METHYLPREDNISOLONE SODIUM SUCC 125 MG IJ SOLR
60.0000 mg | Freq: Once | INTRAMUSCULAR | Status: AC
  Administered 2021-11-18: 60 mg via INTRAMUSCULAR

## 2021-11-18 NOTE — ED Provider Notes (Signed)
[facility]    CSN: [id number] Arrival date & time: 11/18/21  1633      History   Chief Complaint Chief Complaint  Patient presents with   Shortness of Breath   rib cage pain    HPI Sabrina Williams is a 51 y.o. female.   Patient presents with increased fatigue and decreased appetite for 3 to 4 weeks, mid back pain extending to the bilateral ribs and shortness of breath at rest worsened with exertion beginning today.  Also endorses that throat feels like it tightens after long peers of talking.  Concerned with possible kidney failure after Google search of symptoms.  Endorses that her body has had a lot of changes over the last few months, has chronic back pain and is currently being weaned from her gabapentin due to her kidney disease, weaning began 3 to 4 months ago, using nortriptyline and Cymbalta as a supplement for the gabapentin in addition to oxycodone, Zanaflex.  Had sleep study conducted which showed mild apnea, is in need of CPAP but has not gone for fitting.  Endorses that reducing her gabapentin dose has left her with takes to the hand .  Denies changes in her day-to-day activity.  Denies injury or trauma to the abdomen or back.  Denies cough, wheezing, fever, URI symptoms, nausea, vomiting, diarrhea, urinary symptoms or vaginal symptoms.    Past Medical History:  Diagnosis Date   Anxiety    Chronic kidney disease    stones   Chronic pain    Complex regional pain syndrome I    right foot   Depression    Diabetes mellitus    Hypertension    Kidney stone    Neuromuscular disorder [facility])    Tremor     Patient Active Problem List   Diagnosis Date Noted   Sleep paralysis, recurrent isolated 09/03/2021   Status post bariatric surgery 09/03/2021   Postural dizziness with presyncope 09/03/2021   Hypersomnia, periodic 09/03/2021   Class 3 severe obesity due to excess calories with body mass index (BMI) of 40.0 to 44.9 in adult ([place]) 09/03/2021    Obstructive sleep apnea 08/06/2021   Dizziness 08/06/2021   Achilles tendinitis 07/31/2021   Calcaneal spur of right foot 07/14/2021   Acquired pes planus of right foot 06/24/2021   Metatarsalgia of right foot 06/24/2021   Sprain of foot 06/24/2021   Gastroesophageal reflux disease without esophagitis 05/29/2021   Vagal nerve sensitivity 05/29/2021   Alopecia 01/02/2021   Anemia 01/02/2021   Elevated parathyroid hormone 01/02/2021   Hypomagnesemia 01/02/2021   Postprandial hypoglycemia 10/14/2020   Myoclonic jerking 04/02/2019   Benign essential hypertension 03/21/2019   Stage 3 chronic kidney disease ([place]) 03/21/2019   Acquired trigger finger 05/02/2018   Lump on finger 05/02/2018   Anxiety and depression 08/17/2017   Cervical radiculitis 06/03/2017   Heart murmur, systolic 46/27/0350   Alkaline phosphatase elevation 02/21/2017   History of weight loss surgery 02/17/2017   Vitamin D deficiency 11/24/2015   Steatosis of liver 11/14/2015   Diabetes mellitus ([place]) 09/23/2015   Pure hypercholesterolemia 09/23/2015   Multiple joint pain 07/07/2015   Muscle pain 07/07/2015   Right shoulder pain 12/05/2014   Complex regional pain syndrome type 1 of right lower extremity 07/05/2014   Increased frequency of urination 08/21/2013   Kidney stone 07/04/2012   Cervical postlaminectomy syndrome 09/07/2011   Cervical radiculopathy at C6 09/07/2011    Past Surgical History:  Procedure Laterality Date   CERVICAL  SPINE SURGERY     C6-7 fusion   CESAREAN SECTION     x2   dental implant  July 2015   ENDOSCOPIC PLANTAR FASCIOTOMY Right August 2015   GASTRIC BYPASS     HAND SURGERY Right    LITHOTRIPSY     multiple   PLANTAR FASCIECTOMY Right    SHOULDER SURGERY Left 10/2004   SMALL INTESTINE SURGERY     cervical laminectomy   utereroscopy  1995    OB History   No obstetric history on file.      Home Medications    Prior to Admission medications   Medication Sig Start Date  End Date Taking? Authorizing Provider  ascorbic acid (VITAMIN C) 500 MG tablet Take by mouth. 01/02/21 01/02/22 Yes [provider]  B Complex Vitamins (B COMPLEX 1 PO) Take 1 tablet by mouth daily.   Yes [provider]  benazepril (LOTENSIN) 20 MG tablet Take by mouth daily. 09/12/20  Yes [provider]  calcitRIOL (ROCALTROL) 0.5 MCG capsule Take 0.5 mcg by mouth daily. 02/02/20  Yes [provider]  calcium carbonate 1250 MG capsule Take 1,250 mg by mouth daily.   Yes [provider]  Cholecalciferol (VITAMIN D3 PO) Take 2,000 Units by mouth daily.   Yes [provider]  DULoxetine (CYMBALTA) 60 MG capsule Take 60 mg by mouth daily.   Yes [provider]  gabapentin (NEURONTIN) 100 MG capsule Take three capsules twice a day: Weaning her Gabapentin due to CKD. 10/12/21  Yes Bayard Hugger, NP  hydrochlorothiazide (MICROZIDE) 12.5 MG capsule Take 1 capsule by mouth daily. 09/10/21  Yes [provider]  lovastatin (MEVACOR) 20 MG tablet SMARTSIG:1 Tablet(s) By Mouth Every Evening 07/18/20  Yes [provider]  magnesium oxide (MAG-OX) 400 (240 Mg) MG tablet Take 1 tablet by mouth daily. 05/04/21  Yes [provider]  Multiple Vitamins-Minerals (BARIATRIC MULTIVITAMINS/IRON) CAPS Take 3 capsules by mouth daily before supper.   Yes [provider]  nortriptyline (PAMELOR) 75 MG capsule TAKE 1 CAPSULE BY MOUTH AT BEDTIME. 11/09/21  Yes Bayard Hugger, NP  Oxycodone HCl 10 MG TABS Take 1 tablet (10 mg total) by mouth 4 (four) times daily as needed. Do Not fill Before 11/18/2021 10/22/21  Yes Bayard Hugger, NP  tiZANidine (ZANAFLEX) 4 MG tablet Take 1 tablet (4 mg total) by mouth 5 (five) times daily as needed for muscle spasms. 07/22/21  Yes Bayard Hugger, NP  Bismuth Subgallate (DEVROM) 200 MG CHEW Chew 1 tablet by mouth daily.    [provider]  ferrous sulfate 325 (65 FE) MG tablet PLEASE SEE  ATTACHED FOR DETAILED DIRECTIONS 04/07/21   [provider]  Fingerstix Lancets MISC Use 1 each 2 (two) times daily As directed [Test blood sugars in rotating pattern:  fasting, before meals, 2 hours after a meal, at bedtime]    [provider]  gabapentin (NEURONTIN) 600 MG tablet Take 1 tablet (600 mg total) by mouth 5 (five) times daily. 07/22/21   Bayard Hugger, NP  glucose blood test strip USE 1 STRIP TWICE A DAY AS DIRECTED 02/17/15   [provider]  ondansetron (ZOFRAN) 4 MG tablet Take 1 tablet (4 mg total) by mouth 2 (two) times daily as needed for nausea or vomiting. 10/12/21   Bayard Hugger, NP    Family History Family History  Problem Relation Age of Onset   Cancer Mother  multiple myeloma   Diabetes Father    Kidney disease Father    Heart disease Father     Social History Social History   Tobacco Use   Smoking status: Never   Smokeless tobacco: Never  Vaping Use   Vaping Use: Never used  Substance Use Topics   Alcohol use: No    Alcohol/week: 0.0 standard drinks of alcohol   Drug use: No     Allergies   Penicillins   Review of Systems Review of Systems  Constitutional: Negative.   HENT: Negative.    Respiratory:  Positive for shortness of breath. Negative for apnea, cough, choking, chest tightness, wheezing and stridor.   Cardiovascular: Negative.   Gastrointestinal: Negative.   Skin: Negative.      Physical Exam Triage Vital Signs ED Triage Vitals  Enc Vitals Group     BP 11/18/21 1645 114/68     Pulse Rate 11/18/21 1645 (!) 113     Resp 11/18/21 1645 20     Temp 11/18/21 1645 99.1 F (37.3 C)     Temp Source 11/18/21 1645 Oral     SpO2 11/18/21 1645 99 %     Weight 11/18/21 1642 272 lb 11.3 oz (123.7 kg)     Height 11/18/21 1642 $RemoveBefor'5\' 7"'eOhTLlPRFBgk$  (1.702 m)     Head Circumference --      Peak Flow --      Pain Score 11/18/21 1641 8     Pain Loc --      Pain Edu? --      Excl. in [place]? --    No data  found.  Updated Vital Signs BP 114/68 (BP Location: Right Arm)   Pulse (!) 113   Temp 99.1 F (37.3 C) (Oral)   Resp 20   Ht $R'5\' 7"'FZ$  (1.702 m)   Wt 272 lb 11.3 oz (123.7 kg)   LMP 12/02/2019   SpO2 99%   BMI 42.71 kg/m   Visual Acuity Right Eye Distance:   Left Eye Distance:   Bilateral Distance:    Right Eye Near:   Left Eye Near:    Bilateral Near:     Physical Exam Constitutional:      Appearance: Normal appearance.  HENT:     Right Ear: Tympanic membrane, ear canal and external ear normal.     Left Ear: Tympanic membrane, ear canal and external ear normal.     Nose: Nose normal.     Mouth/Throat:     Mouth: Mucous membranes are moist.     Pharynx: Oropharynx is clear.  Eyes:     Extraocular Movements: Extraocular movements intact.  Cardiovascular:     Rate and Rhythm: Normal rate.     Pulses: Normal pulses.     Heart sounds: Normal heart sounds.  Pulmonary:     Effort: Pulmonary effort is normal.     Breath sounds: Normal breath sounds.  Abdominal:     General: Abdomen is flat. Bowel sounds are normal. There is no distension.     Palpations: Abdomen is soft.     Tenderness: There is no abdominal tenderness. There is no right CVA tenderness, left CVA tenderness or guarding.  Musculoskeletal:     Comments: Tenderness is present along the mid thoracic region and along the bilateral lumbar region, significantly worse on the left side, no ecchymosis, swelling or deformity noted  Neurological:     Mental Status: She is alert and oriented to person, place, and time. Mental status is  at baseline.  Psychiatric:        Mood and Affect: Mood normal.        Behavior: Behavior normal.      UC Treatments / Results  Labs (all labs ordered are listed, but only abnormal results are displayed) Labs Reviewed - No data to display  EKG   Radiology No results found.  Procedures Procedures (including critical care time)  Medications Ordered in UC Medications - No  data to display  Initial Impression / Assessment and Plan / UC Course  I have reviewed the triage vital signs and the nursing notes.  Pertinent labs & imaging results that were available during my care of the patient were reviewed by me and considered in my medical decision making (see chart for details).  Acute left-sided back pain without sciatica  Vital signs are stable, O2 saturation 99% on room air, lungs are clear to auscultation, low suspicion for respiratory or cardiac involvement today, kidney function recently evaluated in early June, BUN/creatinine are stable, low suspicion for cause of today's symptoms, etiology is most likely muscular, patient has been on gabapentin which has managed her back pain for years as she starts to reduce the medication I believe she is beginning to have flareups as the pain is no longer well controlled, discussed above with patient, methylprednisolone injection given in office and prednisone 40 mg burst prescribed for outpatient, recommended RICE, heat, daily stretching, massage as tolerated, pillows for support and activity as tolerated, recommended follow-up with pain management physician for reevaluation of symptoms in 1 week and for reevaluation of medications, may follow-up with this urgent care as needed Final Clinical Impressions(s) / UC Diagnoses   Final diagnoses:  None   Discharge Instructions   None    ED Prescriptions   None    PDMP not reviewed this encounter.   Hans Eden, [place] 11/18/21 [phone]

## 2021-11-18 NOTE — ED Triage Notes (Signed)
Pt c/o bilateral rib pain, fatigue, body aches, shortness of breath. She states the fatigue started about couple of days ago but other symptoms started today. She states she has kidney disease.

## 2021-11-18 NOTE — Discharge Instructions (Addendum)
I believe your symptoms today to be related to your back pain, up until the last 4 months your back pain has been well controlled through use of gabapentin and additional medications however as you start to wean off this medicine I believe your back pain is starting to flare, have a very low suspicion of symptoms today be related to your lungs or your heart, on exam your lungs are clear when listened to and you are getting 99% of the ear without any assistance, normal is greater than 90, your shortness of breath is most likely related to your pain as the area where you are having pain that is laying over your lungs and you most likely you are not taking deep breaths in efforts to not exacerbate this discomfort  You have been given an injection of a steroid here in the office today to help reduce any inflammation and irritation that is contributing to your back pain  Starting tomorrow begin use of prednisone tablets every morning with food for the next 5 days to continue this process  You may continue all of your normal daily medications  Please schedule an appointment with the physician who is managing your pain for reevaluation of your medication  You may use heating pad in 15 minute intervals as needed for additional comfort, or you may find comfort in using ice in 10-15 minutes over affected area  Begin stretching affected area daily for 10 minutes as tolerated to further loosen muscles   When lying down place pillow underneath and between knees for support  Can try sleeping without pillow on firm mattress

## 2021-12-03 ENCOUNTER — Inpatient Hospital Stay (HOSPITAL_COMMUNITY)
Admission: EM | Admit: 2021-12-03 | Discharge: 2021-12-06 | DRG: 683 | Disposition: A | Discharge status: Home / Self Care | Payer: 59 | Attending: Internal Medicine | Admitting: Internal Medicine

## 2021-12-03 ENCOUNTER — Emergency Department (HOSPITAL_COMMUNITY): Payer: 59

## 2021-12-03 ENCOUNTER — Encounter: Payer: Self-pay | Admitting: Registered Nurse

## 2021-12-03 ENCOUNTER — Encounter: Payer: 59 | Attending: Physical Medicine & Rehabilitation | Admitting: Registered Nurse

## 2021-12-03 ENCOUNTER — Other Ambulatory Visit: Payer: Self-pay

## 2021-12-03 ENCOUNTER — Encounter (HOSPITAL_COMMUNITY): Payer: Self-pay

## 2021-12-03 VITALS — BP 90/57 | HR 100 | Ht 67.0 in

## 2021-12-03 DIAGNOSIS — D72829 Elevated white blood cell count, unspecified: Secondary | ICD-10-CM | POA: Diagnosis present

## 2021-12-03 DIAGNOSIS — I959 Hypotension, unspecified: Secondary | ICD-10-CM | POA: Diagnosis present

## 2021-12-03 DIAGNOSIS — E872 Acidosis, unspecified: Secondary | ICD-10-CM | POA: Diagnosis present

## 2021-12-03 DIAGNOSIS — N184 Chronic kidney disease, stage 4 (severe): Secondary | ICD-10-CM | POA: Diagnosis present

## 2021-12-03 DIAGNOSIS — Z9884 Bariatric surgery status: Secondary | ICD-10-CM

## 2021-12-03 DIAGNOSIS — M62838 Other muscle spasm: Secondary | ICD-10-CM | POA: Diagnosis present

## 2021-12-03 DIAGNOSIS — Z87442 Personal history of urinary calculi: Secondary | ICD-10-CM | POA: Diagnosis not present

## 2021-12-03 DIAGNOSIS — F419 Anxiety disorder, unspecified: Secondary | ICD-10-CM | POA: Diagnosis present

## 2021-12-03 DIAGNOSIS — E875 Hyperkalemia: Secondary | ICD-10-CM | POA: Diagnosis present

## 2021-12-03 DIAGNOSIS — Z841 Family history of disorders of kidney and ureter: Secondary | ICD-10-CM

## 2021-12-03 DIAGNOSIS — K219 Gastro-esophageal reflux disease without esophagitis: Secondary | ICD-10-CM | POA: Diagnosis present

## 2021-12-03 DIAGNOSIS — F32A Depression, unspecified: Secondary | ICD-10-CM | POA: Diagnosis present

## 2021-12-03 DIAGNOSIS — Z5181 Encounter for therapeutic drug level monitoring: Secondary | ICD-10-CM | POA: Insufficient documentation

## 2021-12-03 DIAGNOSIS — G253 Myoclonus: Secondary | ICD-10-CM | POA: Diagnosis present

## 2021-12-03 DIAGNOSIS — Z981 Arthrodesis status: Secondary | ICD-10-CM

## 2021-12-03 DIAGNOSIS — R8271 Bacteriuria: Secondary | ICD-10-CM | POA: Diagnosis present

## 2021-12-03 DIAGNOSIS — E860 Dehydration: Secondary | ICD-10-CM | POA: Diagnosis present

## 2021-12-03 DIAGNOSIS — Z7952 Long term (current) use of systemic steroids: Secondary | ICD-10-CM

## 2021-12-03 DIAGNOSIS — Z8249 Family history of ischemic heart disease and other diseases of the circulatory system: Secondary | ICD-10-CM | POA: Diagnosis not present

## 2021-12-03 DIAGNOSIS — Z79891 Long term (current) use of opiate analgesic: Secondary | ICD-10-CM | POA: Insufficient documentation

## 2021-12-03 DIAGNOSIS — G894 Chronic pain syndrome: Secondary | ICD-10-CM | POA: Diagnosis not present

## 2021-12-03 DIAGNOSIS — G90521 Complex regional pain syndrome I of right lower limb: Secondary | ICD-10-CM | POA: Diagnosis present

## 2021-12-03 DIAGNOSIS — Z6841 Body Mass Index (BMI) 40.0 and over, adult: Secondary | ICD-10-CM

## 2021-12-03 DIAGNOSIS — E1122 Type 2 diabetes mellitus with diabetic chronic kidney disease: Secondary | ICD-10-CM | POA: Diagnosis present

## 2021-12-03 DIAGNOSIS — D631 Anemia in chronic kidney disease: Secondary | ICD-10-CM | POA: Diagnosis present

## 2021-12-03 DIAGNOSIS — Z833 Family history of diabetes mellitus: Secondary | ICD-10-CM

## 2021-12-03 DIAGNOSIS — N179 Acute kidney failure, unspecified: Secondary | ICD-10-CM | POA: Diagnosis present

## 2021-12-03 DIAGNOSIS — I129 Hypertensive chronic kidney disease with stage 1 through stage 4 chronic kidney disease, or unspecified chronic kidney disease: Secondary | ICD-10-CM | POA: Diagnosis present

## 2021-12-03 DIAGNOSIS — Z880 Allergy status to penicillin: Secondary | ICD-10-CM

## 2021-12-03 DIAGNOSIS — G4733 Obstructive sleep apnea (adult) (pediatric): Secondary | ICD-10-CM | POA: Diagnosis present

## 2021-12-03 DIAGNOSIS — K760 Fatty (change of) liver, not elsewhere classified: Secondary | ICD-10-CM | POA: Diagnosis present

## 2021-12-03 DIAGNOSIS — Z79899 Other long term (current) drug therapy: Secondary | ICD-10-CM

## 2021-12-03 LAB — COMPREHENSIVE METABOLIC PANEL
ALT: 22 U/L (ref 0–44)
AST: 20 U/L (ref 15–41)
Albumin: 2.9 g/dL — ABNORMAL LOW (ref 3.5–5.0)
Alkaline Phosphatase: 86 U/L (ref 38–126)
Anion gap: 10 (ref 5–15)
BUN: 79 mg/dL — ABNORMAL HIGH (ref 6–20)
CO2: 17 mmol/L — ABNORMAL LOW (ref 22–32)
Calcium: 8.7 mg/dL — ABNORMAL LOW (ref 8.9–10.3)
Chloride: 110 mmol/L (ref 98–111)
Creatinine, Ser: 5.05 mg/dL — ABNORMAL HIGH (ref 0.44–1.00)
GFR, Estimated: 10 mL/min — ABNORMAL LOW (ref >60)
Glucose, Bld: 100 mg/dL — ABNORMAL HIGH (ref 70–99)
Potassium: 5.6 mmol/L — ABNORMAL HIGH (ref 3.5–5.1)
Sodium: 137 mmol/L (ref 135–145)
Total Bilirubin: 0.4 mg/dL (ref 0.3–1.2)
Total Protein: 5.8 g/dL — ABNORMAL LOW (ref 6.5–8.1)

## 2021-12-03 LAB — I-STAT VENOUS BLOOD GAS, ED
Acid-base deficit: 12 mmol/L — ABNORMAL HIGH (ref 0.0–2.0)
Bicarbonate: 14.3 mmol/L — ABNORMAL LOW (ref 20.0–28.0)
Calcium, Ion: 1.13 mmol/L — ABNORMAL LOW (ref 1.15–1.40)
HCT: 26 % — ABNORMAL LOW (ref 36.0–46.0)
Hemoglobin: 8.8 g/dL — ABNORMAL LOW (ref 12.0–15.0)
O2 Saturation: 61 %
Potassium: 5.5 mmol/L — ABNORMAL HIGH (ref 3.5–5.1)
Sodium: 138 mmol/L (ref 135–145)
TCO2: 15 mmol/L — ABNORMAL LOW (ref 22–32)
pCO2, Ven: 34.7 mmHg — ABNORMAL LOW (ref 44–60)
pH, Ven: 7.223 — ABNORMAL LOW (ref 7.25–7.43)
pO2, Ven: 37 mmHg (ref 32–45)

## 2021-12-03 LAB — CBC WITH DIFFERENTIAL/PLATELET
Abs Immature Granulocytes: 0.03 10*3/uL (ref 0.00–0.07)
Basophils Absolute: 0 10*3/uL (ref 0.0–0.1)
Basophils Relative: 0 %
Eosinophils Absolute: 0.4 10*3/uL (ref 0.0–0.5)
Eosinophils Relative: 3 %
HCT: 28.3 % — ABNORMAL LOW (ref 36.0–46.0)
Hemoglobin: 9 g/dL — ABNORMAL LOW (ref 12.0–15.0)
Immature Granulocytes: 0 %
Lymphocytes Relative: 23 %
Lymphs Abs: 2.8 10*3/uL (ref 0.7–4.0)
MCH: 30.8 pg (ref 26.0–34.0)
MCHC: 31.8 g/dL (ref 30.0–36.0)
MCV: 96.9 fL (ref 80.0–100.0)
Monocytes Absolute: 0.8 10*3/uL (ref 0.1–1.0)
Monocytes Relative: 7 %
Neutro Abs: 8.1 10*3/uL — ABNORMAL HIGH (ref 1.7–7.7)
Neutrophils Relative %: 67 %
Platelets: 205 10*3/uL (ref 150–400)
RBC: 2.92 MIL/uL — ABNORMAL LOW (ref 3.87–5.11)
RDW: 11.9 % (ref 11.5–15.5)
WBC: 12.1 10*3/uL — ABNORMAL HIGH (ref 4.0–10.5)
nRBC: 0 % (ref 0.0–0.2)

## 2021-12-03 LAB — LACTIC ACID, PLASMA: Lactic Acid, Venous: 1 mmol/L (ref 0.5–1.9)

## 2021-12-03 MED ORDER — HEPARIN SODIUM (PORCINE) 5000 UNIT/ML IJ SOLN
5000.0000 [IU] | Freq: Three times a day (TID) | INTRAMUSCULAR | Status: DC
  Administered 2021-12-03 – 2021-12-06 (×7): 5000 [IU] via SUBCUTANEOUS
  Filled 2021-12-03 (×7): qty 1

## 2021-12-03 MED ORDER — HYDROMORPHONE HCL 2 MG PO TABS
1.0000 mg | ORAL_TABLET | Freq: Four times a day (QID) | ORAL | Status: DC | PRN
  Administered 2021-12-03 – 2021-12-06 (×9): 1 mg via ORAL
  Filled 2021-12-03 (×9): qty 1

## 2021-12-03 MED ORDER — LACTATED RINGERS IV BOLUS
1000.0000 mL | Freq: Once | INTRAVENOUS | Status: AC
  Administered 2021-12-03: 1000 mL via INTRAVENOUS

## 2021-12-03 MED ORDER — NORTRIPTYLINE HCL 25 MG PO CAPS
50.0000 mg | ORAL_CAPSULE | Freq: Every day | ORAL | Status: DC
  Administered 2021-12-03: 50 mg via ORAL
  Filled 2021-12-03: qty 2

## 2021-12-03 MED ORDER — FERROUS SULFATE 325 (65 FE) MG PO TABS
325.0000 mg | ORAL_TABLET | Freq: Every day | ORAL | Status: DC
  Administered 2021-12-04 – 2021-12-06 (×3): 325 mg via ORAL
  Filled 2021-12-03 (×3): qty 1

## 2021-12-03 MED ORDER — LACTATED RINGERS IV SOLN: INTRAVENOUS | Status: DC

## 2021-12-03 MED ORDER — PRAVASTATIN SODIUM 10 MG PO TABS
10.0000 mg | ORAL_TABLET | Freq: Every day | ORAL | Status: DC
  Administered 2021-12-04 – 2021-12-05 (×2): 10 mg via ORAL
  Filled 2021-12-03 (×2): qty 1

## 2021-12-03 MED ORDER — PANTOPRAZOLE SODIUM 40 MG PO TBEC
40.0000 mg | DELAYED_RELEASE_TABLET | Freq: Every day | ORAL | Status: DC
  Administered 2021-12-04 – 2021-12-06 (×3): 40 mg via ORAL
  Filled 2021-12-03 (×3): qty 1

## 2021-12-03 MED ORDER — ONDANSETRON HCL 4 MG PO TABS
4.0000 mg | ORAL_TABLET | Freq: Two times a day (BID) | ORAL | Status: DC | PRN
  Administered 2021-12-04 – 2021-12-06 (×4): 4 mg via ORAL
  Filled 2021-12-03 (×4): qty 1

## 2021-12-03 MED ORDER — ACETAMINOPHEN 325 MG PO TABS: 650.0000 mg | ORAL_TABLET | Freq: Four times a day (QID) | ORAL | Status: DC | PRN

## 2021-12-03 MED ORDER — CALCITRIOL 0.25 MCG PO CAPS
0.5000 ug | ORAL_CAPSULE | Freq: Every day | ORAL | Status: DC
  Administered 2021-12-04 – 2021-12-06 (×3): 0.5 ug via ORAL
  Filled 2021-12-03 (×2): qty 1
  Filled 2021-12-03 (×2): qty 2

## 2021-12-03 MED ORDER — SODIUM BICARBONATE 650 MG PO TABS
650.0000 mg | ORAL_TABLET | Freq: Two times a day (BID) | ORAL | Status: DC
  Administered 2021-12-03 – 2021-12-06 (×6): 650 mg via ORAL
  Filled 2021-12-03 (×6): qty 1

## 2021-12-03 MED ORDER — ACETAMINOPHEN 650 MG RE SUPP: 650.0000 mg | Freq: Four times a day (QID) | RECTAL | Status: DC | PRN

## 2021-12-03 MED ORDER — CALCIUM CARBONATE 1250 (500 CA) MG PO TABS
1250.0000 mg | ORAL_TABLET | Freq: Every day | ORAL | Status: DC
  Administered 2021-12-04 – 2021-12-06 (×3): 1250 mg via ORAL
  Filled 2021-12-03 (×3): qty 1

## 2021-12-03 MED ORDER — ACETAMINOPHEN 650 MG RE SUPP: 650.0000 mg | Freq: Three times a day (TID) | RECTAL | Status: DC

## 2021-12-03 MED ORDER — ACETAMINOPHEN 500 MG PO TABS
1000.0000 mg | ORAL_TABLET | Freq: Three times a day (TID) | ORAL | Status: DC
  Administered 2021-12-03 – 2021-12-06 (×8): 1000 mg via ORAL
  Filled 2021-12-03 (×8): qty 2

## 2021-12-03 NOTE — H&P (Signed)
Date: 12/03/2021               Patient Name:  Sabrina Williams MRN: [id number]  DOB: [birth year] Age / Sex: 51 y.o., female   PCP: Latanya Maudlin, NP         Medical Service: Internal Medicine Teaching Service         Attending Physician: Dr. Charise Killian, MD    First Contact: Romana Juniper, MD Pager: [phone]  Second Contact: Virl Axe, MD Pager: [phone]       After Hours (After 5p/  First Contact Pager: [phone]  weekends / holidays): Second Contact Pager: [phone]   SUBJECTIVE   Chief Complaint: hypotension  History of Present Illness:  Pt is a 51 year old female with a hx of chronic pain, depression, CKD IV and prior staghorn calculi, DM2, and obesity who presents with complaints of hypotension and fatigue.   She was at her pain clinic earlier today and complained of dizziness. She was found to be hypotensive in the 01'V systolic and was directed to [facility] ED.  She has had poor PO intake past 2 weeks d/t poor appetite and worsening nausea, but has not had any abdominal pain or vomiting. Endorses change in taste, but no recent viral symptoms. She notes that PO intake worsened shortly after death of a close friend. Also has a history of gastric sleeve (2017) and reflux.  Despite poor PO intake, she has continued to take her home medicines including several blood pressure and chronic pain medications. She has felt thirstier recently, notes that her urine is slightly darker, has a stronger scent, and that she is urinating less often. She also has noticed worsened fatigue in the past 2 weeks and thinks her vision is blurred now.   She reports episodes of sudden tiredness and generalized weakness which she describes as "getting a shot of morphine", however, this is a chronic problem for her. She reports labile BP's for past 6 years. Denies chest pain, SOB, focal weakness, has never fallen or hit her head. She does appear to have a history of post-prandial hypotension. She takes  gabapentin for chronic pain and follows with neurology for chronic muscle spasms/twitching.  Neurology evaluated for episodes of sleepiness after eating and thought this was due to her gabapentin. She was taking 3000 mg of gabapentin daily and has since been weaned down to 200 mg gabapentin. Her Nucynta was also discontinued. She was started on Cymbalta, amitriptyline, and  oxycodone 4 times daily about 3-4 months ago. Since her medicines were changed, she experiences the  sleepiness only every few days, previously it was 1-2 time a day.  2 weeks ago did not feel well due to withdrawling off her pain medications, but this improved with steroids.  While in the ED she received 2L IVFs with some subjective improvement.    Meds:  Hctz Benazepril Oxycodone Zofran - takes about twice a month Multivitamin Calcium  Ferros sulfate Tazidime  Lovastatin Magnesium  Current Meds  Medication Sig   ascorbic acid (VITAMIN C) 500 MG tablet Take by mouth.   benazepril (LOTENSIN) 20 MG tablet Take 10 mg by mouth 2 (two) times daily.   Bismuth Subgallate (DEVROM) 200 MG CHEW Chew 1 tablet by mouth daily.   calcitRIOL (ROCALTROL) 0.5 MCG capsule Take 0.5 mcg by mouth daily.   calcium carbonate 1250 MG capsule Take 1,250 mg by mouth daily.   Cholecalciferol (VITAMIN D3 PO) Take 2,000 Units by mouth daily.   DULoxetine (  CYMBALTA) 60 MG capsule Take 60 mg by mouth daily.   ferrous sulfate 325 (65 FE) MG tablet Take 325 mg by mouth daily with breakfast.   gabapentin (NEURONTIN) 100 MG capsule Take three capsules twice a day: Weaning her Gabapentin due to CKD. (Patient taking differently: Take 100 mg by mouth 2 (two) times daily. Take three capsules twice a day: Weaning her Gabapentin due to CKD.)   hydrochlorothiazide (MICROZIDE) 12.5 MG capsule Take 1 capsule by mouth daily.   lovastatin (MEVACOR) 20 MG tablet Take 20 mg by mouth every evening.   magnesium oxide (MAG-OX) 400 (240 Mg) MG tablet Take 1 tablet by  mouth daily.   Multiple Vitamins-Minerals (BARIATRIC MULTIVITAMINS/IRON) CAPS Take 3 capsules by mouth daily before supper.   nortriptyline (PAMELOR) 75 MG capsule TAKE 1 CAPSULE BY MOUTH AT BEDTIME.   omeprazole (PRILOSEC) 10 MG capsule Take 20 mg by mouth daily.   ondansetron (ZOFRAN) 4 MG tablet Take 1 tablet (4 mg total) by mouth 2 (two) times daily as needed for nausea or vomiting.   Oxycodone HCl 10 MG TABS Take 1 tablet (10 mg total) by mouth 4 (four) times daily as needed. Do Not fill Before 11/18/2021 (Patient taking differently: Take 10 mg by mouth in the morning, at noon, in the evening, and at bedtime. Do Not fill Before 11/18/2021)   tiZANidine (ZANAFLEX) 4 MG tablet Take 1 tablet (4 mg total) by mouth 5 (five) times daily as needed for muscle spasms. (Patient taking differently: Take 4 mg by mouth in the morning, at noon, in the evening, and at bedtime.)    Past Medical History:  Diagnosis Date   Anxiety    Chronic kidney disease    stones   Chronic pain    Complex regional pain syndrome I    right foot   Depression    Diabetes mellitus    Hypertension    Kidney stone    Neuromuscular disorder (HCC)    Tremor   Chronic pain, C6-c7 fusion with chornic left arm problems CKD Kidney stones recurrent since 15 R plantar fascitis T2DM now diet controlled for past 7 years Gastric bypass Hypertension   Past Surgical History:  Procedure Laterality Date   CERVICAL SPINE SURGERY     C6-7 fusion   CESAREAN SECTION     x2   dental implant  July 2015   ENDOSCOPIC PLANTAR FASCIOTOMY Right August 2015   GASTRIC BYPASS     HAND SURGERY Right    LITHOTRIPSY     multiple   PLANTAR FASCIECTOMY Right    SHOULDER SURGERY Left 10/2004   SMALL INTESTINE SURGERY     cervical laminectomy   utereroscopy  1995    Social:  Lives With: Lives with husband 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   OBJECTIVE:   Physical Exam: Blood pressure (!) 118/57, pulse 83, temperature 98.5 F (36.9 C), temperature source Oral, resp. rate 10, height 5\' 7"  (1.702 m), weight 123.4  kg, last menstrual period 12/02/2019, SpO2 100 %.  Constitutional: well-appearing female sitting in bed, in no acute distress HENT: normocephalic atraumatic, mucous membranes moist, tongue fasciculations Eyes: conjunctiva non-erythematous Neck: supple Cardiovascular: regular rate and rhythm, systolic murmur Pulmonary/Chest: normal work of breathing on room air, lungs clear to auscultation bilaterally Abdominal: soft, non-tender, non-distended MSK: normal bulk and tone Neurological: alert & oriented x 3, 5/5 strength in bilateral upper and lower extremities, periodic muscle twitches of face and extremities, CN grossly intact, EOM intact, no visual field deficits, speech fluent without slurring. No dysmetria or dysdiadochokinesis.  Skin: warm and dry Psych: pleasant  Labs: CBC    Component Value Date/Time   WBC 12.1 (H) 12/03/2021 1553   RBC 2.92 (L) 12/03/2021 1553   HGB 8.8 (L) 12/03/2021 1854   HGB 12.4 04/02/2019 1436   HCT 26.0 (L) 12/03/2021 1854   HCT 38.1 04/02/2019 1436   PLT 205 12/03/2021 1553   PLT 258 04/02/2019 1436   MCV 96.9 12/03/2021 1553   MCV 94 04/02/2019 1436   MCH 30.8 12/03/2021 1553   MCHC 31.8 12/03/2021 1553   RDW 11.9 12/03/2021 1553   RDW 12.2 04/02/2019 1436   LYMPHSABS 2.8 12/03/2021 1553   LYMPHSABS 3.2 (H) 04/02/2019 1436   MONOABS 0.8  12/03/2021 1553   EOSABS 0.4 12/03/2021 1553   EOSABS 0.5 (H) 04/02/2019 1436   BASOSABS 0.0 12/03/2021 1553   BASOSABS 0.1 04/02/2019 1436     CMP     Component Value Date/Time   NA 138 12/03/2021 1854   NA 141 10/27/2021 1632   K 5.5 (H) 12/03/2021 1854   CL 110 12/03/2021 1553   CO2 17 (L) 12/03/2021 1553   GLUCOSE 100 (H) 12/03/2021 1553   BUN 79 (H) 12/03/2021 1553   BUN 38 (H) 10/27/2021 1632   CREATININE 5.05 (H) 12/03/2021 1553   CALCIUM 8.7 (L) 12/03/2021 1553   PROT 5.8 (L) 12/03/2021 1553   PROT 6.2 10/27/2021 1632   ALBUMIN 2.9 (L) 12/03/2021 1553   ALBUMIN 3.9 10/27/2021 1632   AST 20 12/03/2021 1553   ALT 22 12/03/2021 1553   ALKPHOS 86 12/03/2021 1553   BILITOT 0.4 12/03/2021 1553   BILITOT <0.2 10/27/2021 1632   GFRNONAA 10 (L) 12/03/2021 1553   GFRAA 29 (L) 04/02/2019 1436    Imaging: CT Renal Stone Study  Result Date: 12/03/2021 CLINICAL DATA:  Renal stones, pain EXAM: CT ABDOMEN AND PELVIS WITHOUT CONTRAST TECHNIQUE: Multidetector CT imaging of the abdomen and pelvis was performed following the standard protocol without IV contrast. RADIATION DOSE REDUCTION: This exam was performed according to the departmental dose-optimization program which includes automated exposure control, adjustment of the mA and/or kV according to patient size and/or use of iterative reconstruction technique. COMPARISON:  Renal sonogram done earlier today FINDINGS: Lower chest: Unremarkable. Hepatobiliary: No focal abnormalities are seen in the liver. There is no dilation of bile ducts. There is high density in the dependent portion of gallbladder suggesting gallstones. There is no wall thickening. There is no fluid around the gallbladder. Pancreas: No focal abnormalities are seen. Spleen: Unremarkable. Adrenals/Urinary Tract: Adrenals are unremarkable. There is no hydronephrosis. There is a 1.4 cm calculus in the midportion of left kidney. There is a 5 mm calculus in the lower pole of  right kidney. Lobulations are seen in the margins of both kidneys. Evaluation of renal cortex is limited in this noncontrast study. Ureters are not dilated. Urinary bladder is unremarkable. Stomach/Bowel: There is evidence of  previous bariatric surgery with surgical staples in the stomach. Small hiatal hernia is seen. Small bowel loops are not dilated. The appendix is difficult to visualize. In image 57 of series 5, there is small caliber tubular structure adjacent to the tip of the cecum, possibly normal appendix. There is no pericecal inflammation. Scattered diverticula are seen in colon. There is no evidence of focal acute diverticulitis. Moderate to large amount of stool is seen in colon and rectum. Vascular/Lymphatic: Scattered arterial calcifications are seen. There are subcentimeter lymph nodes in mesentery. There is minimal stranding in the mesenteric fat. There is no loculated fluid collection in the mesentery. Reproductive: Unremarkable. Other: There is no ascites or pneumoperitoneum. There is a small umbilical hernia containing fat. Musculoskeletal: Degenerative changes are noted in lumbar spine with encroachment of neural foramina, more so at L5-S1 level. IMPRESSION: There is no evidence of intestinal obstruction or pneumoperitoneum. There is no hydronephrosis. There are bilateral nonobstructing renal stones. Gallbladder stones. Few diverticula are seen in the colon without signs of focal diverticulitis. Small hiatal hernia. Other findings as described in the body of the report. Electronically Signed   By: Elmer Picker M.D.   On: 12/03/2021 19:49   US RENAL  Result Date: 12/03/2021 CLINICAL DATA:  Azotemia EXAM: RENAL / URINARY TRACT ULTRASOUND COMPLETE COMPARISON:  Renal ultrasound 08/05/2021 FINDINGS: Right Kidney: Renal measurements: 11.2 x 5.2 x 5.1 cm = volume: 153 mL. Echogenicity is increased, unchanged. No hydronephrosis. Left Kidney: Renal measurements: 10.7 x 4.2 x 5.0 cm = volume:  116 mL. Echogenicity is increased, unchanged. No hydronephrosis. There is a 1 cm shadowing calculus in the left kidney. Bladder: Appears normal for degree of bladder distention. Other: None. IMPRESSION: 1. Echogenic kidneys likely related to medical renal disease. 2. No hydronephrosis. 3. Stable left renal calculus. Electronically Signed   By: Ronney Asters M.D.   On: 12/03/2021 18:22    EKG: personally reviewed my interpretation isNSR   ASSESSMENT & PLAN:    Assessment & Plan by Problem: Principal Problem:   AKI (acute kidney injury) ([place])   ANNALIS KACZMARCZYK is a 51 y.o. with pertinent PMH of chronic pain, depression, CKD IV, DM2, obesity who presented with hypotension and admitted for AKI on hospital day 0  # AKI on CKD 4 # Symptomatic Hypotension # Non-anion gap Metabolic acidosis likely due to CKD - Cr noted to be 5.1 in ED, baseline ~ 2.5.  - AKI likely prerenal due to hypotension in the setting of dehydration from reduced PO intake and continued BP and pain medication usage. Renal US and CT renal negative. - PO fluids and IVFs as necessary.  - Monitor Daily I+Os and weight - Anticipate AKI improves with fluids. Checking UA to assess for ATN. - Urine studies unlikely to be beneficial s/p 2L fluids. - Her hypotension has improved s/p 2L bolus in ED.  - continue to hold BP and pain meds for now.  - Started on sodium bicarb 650 BID.   # Chronic pain s/p C6-7 spinal fusion # Depression # Myoclonic jerking - Follows with phys med for chronic pain who prescribed Gabapentin, Oxycodone Q4 hrs Tazidime  - muscle twitching felt to be related to build up of pain medications in the setting of AKI. These are being held. Unlikely to be related to uremia as symptoms precede this AKI. - We are holding gabapentin, nortriptyline, and oxycodone.  - Have switched to PO dilaudid for now until AKI resolves to prevent opioid withdrawal.  -  scheduled tylenol - will need to employ non-pharmacologic  strategies and non-systemic medications as necessary until AKI resolves  # Hyperkalemia - potassium 5.5 in ED, likely related to reduced renal funciton.  - Peaked T waves on EKG, but otherwise stable.  - Daily BMP to monitor for improvement with fluids - telemetry  Normocytic Anemia - Hgb noted to be 9 prior to fluid administration.  - Likely related to her CKD. No reported GIB - continue iron supplementation. - Iron studies ordered, will follow up.   DM2 - not currently taking anything. - check A1c. May need to start on SSI  Obesity GERD - history of bariatric surgery (2017)  - Prilosec for reflux - Zofran for nausea  Diet: Carb/Renal VTE: Heparin IVF: None,None Code: Full  Prior to Admission Living Arrangement: Home, living husband Anticipated Discharge Location: Home Barriers to Discharge: medical stability  Dispo: Admit patient to inpatient  Signed: Delene Ruffini, MD Internal Medicine Resident PGY-1 12/03/2021, 10:38 PM

## 2021-12-03 NOTE — ED Triage Notes (Signed)
Patient BIB from pain clinic after having hypotension dizziness and all extremities muscle tremors and twitching.  MD has been trying to titrate down on gabapentin due to kidney dysfunction. Initial BP 82/58 HR 102 CBG 168   NSR on monitor.

## 2021-12-03 NOTE — ED Provider Notes (Signed)
[facility] EMERGENCY DEPARTMENT Provider Note  CSN: [id number] Arrival date & time: 12/03/21 1536  Chief Complaint(s) Hypotension  HPI Sabrina Williams is a 51 y.o. female with PMH staghorn calculi, T2DM, complex regional pain syndrome, anxiety who presents emergency department for evaluation of low blood pressure.  Patient states that she went to pain clinic this morning and they have been trying to titrate down her gabapentin given her history of CKD when they found the patient to be hypotensive in the 80s.  She states that over the last month she has had intermittent episodes of orthostasis and she arrives with dizziness.  She states that she has been tolerating less p.o. recently but has been continuing to take her hydrochlorothiazide.  She states that there was a recent change over the last month to her pain regimen where she has been taking oxycodone every 4 hours.  She also endorses persistent intermittent myoclonic jerking that neurology is following and thinks may be secondary to her very high doses of gabapentin.  Patient intermittently jerking during my examination.   Past Medical History Past Medical History:  Diagnosis Date   Anxiety    Chronic kidney disease    stones   Chronic pain    Complex regional pain syndrome I    right foot   Depression    Diabetes mellitus    Hypertension    Kidney stone    Neuromuscular disorder [facility])    Tremor    Patient Active Problem List   Diagnosis Date Noted   Sleep paralysis, recurrent isolated 09/03/2021   Status post bariatric surgery 09/03/2021   Postural dizziness with presyncope 09/03/2021   Hypersomnia, periodic 09/03/2021   Class 3 severe obesity due to excess calories with body mass index (BMI) of 40.0 to 44.9 in adult ([place]) 09/03/2021   Obstructive sleep apnea 08/06/2021   Dizziness 08/06/2021   Achilles tendinitis 07/31/2021   Calcaneal spur of right foot 07/14/2021   Acquired pes planus of right foot  06/24/2021   Metatarsalgia of right foot 06/24/2021   Sprain of foot 06/24/2021   Gastroesophageal reflux disease without esophagitis 05/29/2021   Vagal nerve sensitivity 05/29/2021   Alopecia 01/02/2021   Anemia 01/02/2021   Elevated parathyroid hormone 01/02/2021   Hypomagnesemia 01/02/2021   Postprandial hypoglycemia 10/14/2020   Myoclonic jerking 04/02/2019   Benign essential hypertension 03/21/2019   Stage 3 chronic kidney disease ([place]) 03/21/2019   Acquired trigger finger 05/02/2018   Lump on finger 05/02/2018   Anxiety and depression 08/17/2017   Cervical radiculitis 06/03/2017   Heart murmur, systolic 13/12/6576   Alkaline phosphatase elevation 02/21/2017   History of weight loss surgery 02/17/2017   Vitamin D deficiency 11/24/2015   Steatosis of liver 11/14/2015   Diabetes mellitus ([place]) 09/23/2015   Pure hypercholesterolemia 09/23/2015   Multiple joint pain 07/07/2015   Muscle pain 07/07/2015   Right shoulder pain 12/05/2014   Complex regional pain syndrome type 1 of right lower extremity 07/05/2014   Increased frequency of urination 08/21/2013   Kidney stone 07/04/2012   Cervical postlaminectomy syndrome 09/07/2011   Cervical radiculopathy at C6 09/07/2011   Home Medication(s) Prior to Admission medications   Medication Sig Start Date End Date Taking? Authorizing Provider  ascorbic acid (VITAMIN C) 500 MG tablet Take by mouth. 01/02/21 01/02/22  [provider]  B Complex Vitamins (B COMPLEX 1 PO) Take 1 tablet by mouth daily.    [provider]  benazepril (LOTENSIN) 20 MG tablet Take  by mouth daily. 09/12/20   [provider]  Bismuth Subgallate (DEVROM) 200 MG CHEW Chew 1 tablet by mouth daily.    [provider]  calcitRIOL (ROCALTROL) 0.5 MCG capsule Take 0.5 mcg by mouth daily. 02/02/20   [provider]  calcium carbonate 1250 MG capsule Take 1,250 mg by mouth daily.    [provider]  Cholecalciferol  (VITAMIN D3 PO) Take 2,000 Units by mouth daily.    [provider]  DULoxetine (CYMBALTA) 60 MG capsule Take 60 mg by mouth daily.    [provider]  ferrous sulfate 325 (65 FE) MG tablet PLEASE SEE ATTACHED FOR DETAILED DIRECTIONS 04/07/21   [provider]  Fingerstix Lancets MISC Use 1 each 2 (two) times daily As directed [Test blood sugars in rotating pattern:  fasting, before meals, 2 hours after a meal, at bedtime]    [provider]  gabapentin (NEURONTIN) 100 MG capsule Take three capsules twice a day: Weaning her Gabapentin due to CKD. 10/12/21   Bayard Hugger, NP  gabapentin (NEURONTIN) 600 MG tablet Take 1 tablet (600 mg total) by mouth 5 (five) times daily. 07/22/21   Bayard Hugger, NP  glucose blood test strip USE 1 STRIP TWICE A DAY AS DIRECTED 02/17/15   [provider]  hydrochlorothiazide (MICROZIDE) 12.5 MG capsule Take 1 capsule by mouth daily. 09/10/21   [provider]  lovastatin (MEVACOR) 20 MG tablet SMARTSIG:1 Tablet(s) By Mouth Every Evening 07/18/20   [provider]  magnesium oxide (MAG-OX) 400 (240 Mg) MG tablet Take 1 tablet by mouth daily. 05/04/21   [provider]  Multiple Vitamins-Minerals (BARIATRIC MULTIVITAMINS/IRON) CAPS Take 3 capsules by mouth daily before supper.    [provider]  nortriptyline (PAMELOR) 75 MG capsule TAKE 1 CAPSULE BY MOUTH AT BEDTIME. 11/09/21   Bayard Hugger, NP  omeprazole (PRILOSEC) 10 MG capsule     [provider]  ondansetron (ZOFRAN) 4 MG tablet Take 1 tablet (4 mg total) by mouth 2 (two) times daily as needed for nausea or vomiting. 10/12/21   Bayard Hugger, NP  Oxycodone HCl 10 MG TABS Take 1 tablet (10 mg total) by mouth 4 (four) times daily as needed. Do Not fill Before 11/18/2021 10/22/21   Bayard Hugger, NP  predniSONE (DELTASONE) 20 MG tablet Take 2 tablets (40 mg total) by mouth daily. 11/18/21   White, Leitha Schuller, NP   tiZANidine (ZANAFLEX) 4 MG tablet Take 1 tablet (4 mg total) by mouth 5 (five) times daily as needed for muscle spasms. 07/22/21   Bayard Hugger, NP                                                                                                                                    Past Surgical History Past Surgical History:  Procedure Laterality Date   CERVICAL SPINE SURGERY  C6-7 fusion   CESAREAN SECTION     x2   dental implant  July 2015   ENDOSCOPIC PLANTAR FASCIOTOMY Right August 2015   GASTRIC BYPASS     HAND SURGERY Right    LITHOTRIPSY     multiple   PLANTAR FASCIECTOMY Right    SHOULDER SURGERY Left 10/2004   SMALL INTESTINE SURGERY     cervical laminectomy   utereroscopy  1995   Family History Family History  Problem Relation Age of Onset   Cancer Mother        multiple myeloma   Diabetes Father    Kidney disease Father    Heart disease Father     Social History Social History   Tobacco Use   Smoking status: Never   Smokeless tobacco: Never  Vaping Use   Vaping Use: Never used  Substance Use Topics   Alcohol use: No    Alcohol/week: 0.0 standard drinks of alcohol   Drug use: No   Allergies Penicillins  Review of Systems Review of Systems  Constitutional:  Positive for appetite change and fatigue.  Neurological:  Positive for dizziness.       Myoclonic jerks    Physical Exam Vital Signs  I have reviewed the triage vital signs BP (!) 84/48   Pulse 79   Temp 98.5 F (36.9 C) (Oral)   Resp 12   Ht 5' 7" (1.702 m)   Wt 123.4 kg   LMP 12/02/2019   SpO2 100%   BMI 42.60 kg/m   Physical Exam Vitals and nursing note reviewed.  Constitutional:      General: She is not in acute distress.    Appearance: She is well-developed.  HENT:     Head: Normocephalic and atraumatic.  Eyes:     Conjunctiva/sclera: Conjunctivae normal.  Cardiovascular:     Rate and Rhythm: Normal rate and regular rhythm.     Heart sounds: No murmur  heard. Pulmonary:     Effort: Pulmonary effort is normal. No respiratory distress.     Breath sounds: Normal breath sounds.  Abdominal:     Palpations: Abdomen is soft.     Tenderness: There is abdominal tenderness.  Musculoskeletal:        General: No swelling.     Cervical back: Neck supple.  Skin:    General: Skin is warm and dry.     Capillary Refill: Capillary refill takes less than 2 seconds.  Neurological:     Mental Status: She is alert.     Comments: Intermittent myoclonus  Psychiatric:        Mood and Affect: Mood normal.     ED Results and Treatments Labs (all labs ordered are listed, but only abnormal results are displayed) Labs Reviewed  CBC WITH DIFFERENTIAL/PLATELET - Abnormal; Notable for the following components:      Result Value   WBC 12.1 (*)    RBC 2.92 (*)    Hemoglobin 9.0 (*)    HCT 28.3 (*)    Neutro Abs 8.1 (*)    All other components within normal limits  COMPREHENSIVE METABOLIC PANEL - Abnormal; Notable for the following components:   Potassium 5.6 (*)    CO2 17 (*)    Glucose, Bld 100 (*)    BUN 79 (*)    Creatinine, Ser 5.05 (*)    Calcium 8.7 (*)    Total Protein 5.8 (*)    Albumin 2.9 (*)    GFR, Estimated 10 (*)  All other components within normal limits  LACTIC ACID, PLASMA  URINALYSIS, ROUTINE W REFLEX MICROSCOPIC                                                                                                                          Radiology No results found.  Pertinent labs & imaging results that were available during my care of the patient were reviewed by me and considered in my medical decision making (see MDM for details).  Medications Ordered in ED Medications  lactated ringers bolus 1,000 mL (has no administration in time range)  lactated ringers bolus 1,000 mL (0 mLs Intravenous Stopped 12/03/21 1634)                                                                                                                                      Procedures .Critical Care  Performed by: Kommor, Madison, MD Authorized by: Kommor, Madison, MD   Critical care provider statement:    Critical care time (minutes):  75   Critical care was necessary to treat or prevent imminent or life-threatening deterioration of the following conditions:  Circulatory failure, dehydration and renal failure   Critical care was time spent personally by me on the following activities:  Development of treatment plan with patient or surrogate, discussions with consultants, evaluation of patient's response to treatment, examination of patient, ordering and review of laboratory studies, ordering and review of radiographic studies, ordering and performing treatments and interventions, pulse oximetry, re-evaluation of patient's condition and review of old charts   (including critical care time)  Medical Decision Making / ED Course   This patient presents to the ED for concern of dizziness and hypotension, this involves an extensive number of treatment options, and is a complaint that carries with it a high risk of complications and morbidity.  The differential diagnosis includes hypovolemia, septic shock, obstructive shock, polypharmacy, uremia  MDM: Patient seen in the emergency room for evaluation of dizziness and hypotension.  Physical exam with tenderness in the right and left lower quadrants but is otherwise unremarkable.  She does have intermittent myoclonic jerking of the upper extremities during my exam.  Patient initially hypotensive on arrival with systolics in the low 80s upper 70s but responded well to 2 L of lactated Ringer's.  Laboratory evaluation with leukocytosis to 12.1, hemoglobin 9.0, potassium 5.6, CO2 17, BUN is significant elevated at 79   and creatinine of 5.05 which is a significant elevation for this patient.  Renal ultrasound obtained showing no hydronephrosis and is concerning for medical disease.  Renal consulted and  recommendations are pending.  CT stone study to follow.  Patient will require admission for hypovolemia and new significant azotemia.   Additional history obtained: -Additional history obtained from husband -External records from outside source obtained and reviewed including: Chart review including previous notes, labs, imaging, consultation notes   Lab Tests: -I ordered, reviewed, and interpreted labs.   The pertinent results include:   Labs Reviewed  CBC WITH DIFFERENTIAL/PLATELET - Abnormal; Notable for the following components:      Result Value   WBC 12.1 (*)    RBC 2.92 (*)    Hemoglobin 9.0 (*)    HCT 28.3 (*)    Neutro Abs 8.1 (*)    All other components within normal limits  COMPREHENSIVE METABOLIC PANEL - Abnormal; Notable for the following components:   Potassium 5.6 (*)    CO2 17 (*)    Glucose, Bld 100 (*)    BUN 79 (*)    Creatinine, Ser 5.05 (*)    Calcium 8.7 (*)    Total Protein 5.8 (*)    Albumin 2.9 (*)    GFR, Estimated 10 (*)    All other components within normal limits  LACTIC ACID, PLASMA  URINALYSIS, ROUTINE W REFLEX MICROSCOPIC      EKG   EKG Interpretation  Date/Time:  Thursday December 03 2021 16:02:02 EDT Ventricular Rate:  93 PR Interval:    QRS Duration: 106 QT Interval:  346 QTC Calculation: 431 R Axis:   60 Text Interpretation: Normal sinus rhythm Ventricular premature complex possible hyperacute T waves Confirmed by Jaryd Drew ([id number]) on 12/03/2021 6:40:35 PM         Imaging Studies ordered: I ordered imaging studies including ultrasound renal I independently visualized and interpreted imaging. I agree with the radiologist interpretation   Medicines ordered and prescription drug management: Meds ordered this encounter  Medications   lactated ringers bolus 1,000 mL   lactated ringers bolus 1,000 mL    -I have reviewed the patients home medicines and have made adjustments as needed  Critical interventions Fluid  resuscitation  Consultations Obtained: I requested consultation with the nephrologist Dr. Osborne Casco,  and discussed lab and imaging findings as well as pertinent plan - they recommend: Medical admission   Cardiac Monitoring: The patient was maintained on a cardiac monitor.  I personally viewed and interpreted the cardiac monitored which showed an underlying rhythm of: NSR  Social Determinants of Health:  Factors impacting patients care include: none   Reevaluation: After the interventions noted above, I reevaluated the patient and found that they have :improved  Co morbidities that complicate the patient evaluation  Past Medical History:  Diagnosis Date   Anxiety    Chronic kidney disease    stones   Chronic pain    Complex regional pain syndrome I    right foot   Depression    Diabetes mellitus    Hypertension    Kidney stone    Neuromuscular disorder ([place])    Tremor       Dispostion: I considered admission for this patient, and given new significant azotemia, patient will require admission     Final Clinical Impression(s) / ED Diagnoses Final diagnoses:  None     _0 @    Teressa Lower, MD 12/03/21 [phone]

## 2021-12-03 NOTE — Progress Notes (Signed)
Subjective:    Patient ID: Sabrina Williams, female    DOB: Apr 15, 1971, 51 y.o.   MRN: [id number]  HPI: Sabrina Williams is a 51 y.o. female who returns for follow up appointment for chronic pain and medication refill. Sabrina Williams arrived to office reporting she felt weak, having dizziness and tightness in her throat. She denies any chest pain,  she was placed in a wheelchair. Sabrina Williams having Myoclonic jerks, she states the jerking developed two weeks ago. She hasn't called her PCP or nephrologist regarding the Myoclonic jerking. She also reports she has maintained her same  gabapentin dose. Spoke with Mr. And Mrs Sabrina Williams regarding the above, she agrees to go to the emergency room  for evaluation, EMS was called.   Sabrina Williams also reports she has lost 12- 15lbs, since her last visit with a ppoor appetite.  She also reports she has pain  in her mid- lower back . She rates her pain 3. Her current exercise regime is walking and performing stretching exercises.  Ms. Marston Morphine equivalent is 66.00 MME.   Last UDS was Performed on 06/24/2021     Pain Inventory Average Pain 4 Pain Right Now 3 My pain is constant, sharp, burning, stabbing, tingling, and aching  In the last 24 hours, has pain interfered with the following? General activity 8 Relation with others 8 Enjoyment of life 8 What TIME of day is your pain at its worst? morning  and night Sleep (in general) Poor  Pain is worse with: walking, bending, sitting, standing, and some activites Pain improves with: rest, heat/ice, and therapy/exercise Relief from Meds: 5  Family History  Problem Relation Age of Onset   Cancer Mother        multiple myeloma   Diabetes Father    Kidney disease Father    Heart disease Father    Social History   Socioeconomic History   Marital status: Married    Spouse name: Not on file   Number of children: 2   Years of education: some college   Highest education level: Not on file   Occupational History   Occupation: homemaker  Tobacco Use   Smoking status: Never   Smokeless tobacco: Never  Vaping Use   Vaping Use: Never used  Substance and Sexual Activity   Alcohol use: No    Alcohol/week: 0.0 standard drinks of alcohol   Drug use: No   Sexual activity: Not on file  Other Topics Concern   Not on file  Social History Narrative   Lives at home with husband.   Right-handed.   No daily caffeine use.   Social Determinants of Health   Financial Resource Strain: Not on file  Food Insecurity: Not on file  Transportation Needs: Not on file  Physical Activity: Not on file  Stress: Not on file  Social Connections: Not on file   Past Surgical History:  Procedure Laterality Date   CERVICAL SPINE SURGERY     C6-7 fusion   CESAREAN SECTION     x2   dental implant  July 2015   ENDOSCOPIC PLANTAR FASCIOTOMY Right August 2015   GASTRIC BYPASS     HAND SURGERY Right    LITHOTRIPSY     multiple   PLANTAR FASCIECTOMY Right    SHOULDER SURGERY Left 10/2004   SMALL INTESTINE SURGERY     cervical laminectomy   utereroscopy  1995   Past Surgical History:  Procedure Laterality Date   CERVICAL SPINE  SURGERY     C6-7 fusion   CESAREAN SECTION     x2   dental implant  July 2015   ENDOSCOPIC PLANTAR FASCIOTOMY Right August 2015   GASTRIC BYPASS     HAND SURGERY Right    LITHOTRIPSY     multiple   PLANTAR FASCIECTOMY Right    SHOULDER SURGERY Left 10/2004   SMALL INTESTINE SURGERY     cervical laminectomy   utereroscopy  1995   Past Medical History:  Diagnosis Date   Anxiety    Chronic kidney disease    stones   Chronic pain    Complex regional pain syndrome I    right foot   Depression    Diabetes mellitus    Hypertension    Kidney stone    Neuromuscular disorder ([place])    Tremor    LMP 12/02/2019   Opioid Risk Score:   Fall Risk Score:  `1  Depression screen [facility] 2/9     10/22/2021   11:28 AM 09/23/2021   11:53 AM 08/25/2021   11:15 AM  06/24/2021   11:27 AM 05/26/2021    1:11 PM 02/23/2021   12:09 PM 12/31/2020    9:27 AM  Depression screen PHQ 2/9  Decreased Interest 0 1 0 0 1 0 0  Down, Depressed, Hopeless 0 1 0 0 1 0 0  PHQ - 2 Score 0 2 0 0 2 0 0    Review of Systems  Musculoskeletal:  Positive for gait problem.       Right knee pain, left arm pain, pain in both feet  All other systems reviewed and are negative.     Objective:   Physical Exam Vitals and nursing note reviewed.  Constitutional:      Appearance: She is ill-appearing.  Cardiovascular:     Rate and Rhythm: Normal rate and regular rhythm.     Pulses: Normal pulses.  Pulmonary:     Effort: Pulmonary effort is normal.     Breath sounds: Normal breath sounds.  Musculoskeletal:     Cervical back: Normal range of motion and neck supple.     Comments: Normal Muscle Bulk and Muscle Testing Reveals:  Upper Extremities: Full ROM and Muscle Strength 5/5  Lower Extremities Full ROM and Muscle Strength 5/5 Transfer to EMS Stretcher      Skin:    General: Skin is warm and dry.  Neurological:     Mental Status: She is alert and oriented to person, place, and time.  Psychiatric:        Mood and Affect: Mood normal.        Behavior: Behavior normal.         Assessment & Plan:  1.Complex regional pain syndrome right lower extremity postoperative. She's s/p post plantar fascial release. She continue with sensitivity to touch. She has diffuse numbness and tingling.Gabapentin being weaned slowly . 12/03/2021. Nucynta discontinued due to Urine Clearance. RX: Oxycodone 10 mg 4 times a day as needed for pain #120, .We will continue the opioid monitoring program, this consists of regular clinic visits, examinations, urine drug screen, pill counts as well as use of [place] Controlled Substance Reporting system. A 12 month History has been reviewed on the [place] Controlled Substance Reporting System on 12/03/2021. 2.Myofascial pain syndrome: Continue  current medication regime with Tizanidine and  ice, heat and exercise regime. 12/03/2021 3. Depression: Continue: Zoloft. PCP Following. 12/03/2021 4. Cervical Post Laminectomy: Continue to Monitor. 12/03/2021 5. Cervicalgia/Cervical  Radiculopathy/ Left shoulder, Left Arm and Left  Hand Pain with tingling,  Continue current medication regimen. Continue to monitor. 12/03/2021 6. Poly Neuropathy: Continue current medication regimen and :Continue to Monitor. 12/03/2021 7. Left Shoulder Tendonitis: Chronic Left Shoulder Pain:  Ortho Following. Continue current medication regime and Continue  to Monitor. 12/03/2021 8. Right hand pain/ Post Surgical  Procedure: Trigger Finger Release: Dr. Jerilee Hoh Following.  No complaints Today. 12/03/2021. 9. Tremor: No complaints today. Gabapentin being weaned slowly. Continue to monitor.  07/13//2023 10. Right   Knee Pain: Orthopedics Following: Continue with HEP as Tolerated. Continue to Monitor. 07/13/ 2023 11. Chronic Bilateral  Feet Pain. Podiatry Following.Continue with HEP as tolerated. Continue current medication regimen. Continue to monitor. 12/03/2021  12. Left Greater Trochanter Bursitis: No complaints today. Continue to alternate Ice and Heat Therapy. Continue to Monitor. 12/03/2021  13. Myoclonic Jerks: Weakness: Dizziness: EMS Called : Transferred to [facility] for Evaluation.     F/U in 1 month

## 2021-12-03 NOTE — Consult Note (Addendum)
Nephrology Consult   Requesting provider: [facility] Service requesting consult: ER Reason for consult: AKI on CKD   Assessment/Recommendations: Sabrina Williams is a/an 51 y.o. female with a past medical history chronic pain, staghorn calculi, dm2, chronic pain, CKD IV  who present w/ hypotension  Hypotension: Most likely related to dehydration based on the patient's history.  Already improved with hydration.  Continue with IV fluids and hold home blood pressure medications.  Jerking: Possibly myoclonic.  I think this is less likely uremia based on the patient's history.  She has had similar jerking before when her kidney function was at her baseline.  I think what is more likely is that she had an AKI which caused decreased clearance of her pain medications which then caused jerking.  Either way I think with treatment of her AKI will likely improve -Treat AKI as below -Would hold gabapentin, nortriptyline, and minimize oxycodone use for now  AKI on CKD 4: Most likely secondary to dehydration.  Follows with Dr. Holley Raring for CKD mgmt. Baseline 2.4-2.6. No hydro on RUS -Continue with IVFs and encourage po hydration -Hold home benazepril and hydrochlorothiazide -Continue to monitor daily Cr, Dose meds for GFR -Monitor Daily I/Os, Daily weight  -Maintain MAP>65 for optimal renal perfusion.  -Avoid nephrotoxic medications including NSAIDs -Use synthetic opioids (Fentanyl/Dilaudid) if needed -Currently no indication for HD -f/u UA  Hyperkalemia: Potassium 5.5.  Likely will improve with treatment of AKI.  Continue monitoring morning labs  Metabolic acidosis: Bicarb 17.  Start sodium bicarbonate 650 mg twice daily  Chronic pain: Holding some medications as above  Anemia: hgb 8.8. Likely multifactorial. Send iron studies.  Recommendations conveyed to primary service.    [facility] 12/03/2021 8:11  PM   _____________________________________________________________________________________ CC: hypotension  History of Present Illness: Sabrina Williams is a/an 51 y.o. female with a past medical history of chronic pain, staghorn calculi, dm2, chronic pain, CKD IV who presents with hypotension.  The patient states that she has been dealing with several problems over the past 6 months or so.  She has chronic pain which has been difficult to treat.  She was having some twitching several months ago and saw her neurologist who noted she was on gabapentin 3000 mg a day!  The dose is now been titrated down to 200 mg a day and for the most part her twitching has improved until recently.  She also has had issues with fluctuating blood pressures in the past.  She used to take NSAIDs quite frequently but now does not take any.  Her current medications for chronic pain include oxycodone a total of 40 mg a day, nortriptyline 75 mg a day, gabapentin 200 mg a day, Cymbalta, Zanaflex.  She does not take baclofen.  She came to the emergency department today because she was at pain clinic this morning and she was noted to be hypotensive with blood pressures in the 80s.  The patient states that for the last few weeks she has had a really hard time eating.  She has just felt sick to her stomach and has lost almost 15 pounds over the past few weeks.  She notes that she is very likely dehydrated and has not been drinking much.  She has continued to take her ACE inhibitor and hydrochlorothiazide as prescribed for her hypertension.  She also has continued to take her pain medications as prescribed.  Only recently has she noticed that the jerking has also returned.  She denies significant shortness of breath or chest pain.  No diarrhea.  No issues urinating  In the emergency department she was found to have a creatinine of 5.1.  She also was hypotensive and provided with 2 L of fluid with improvement in her blood pressures.   She had a renal ultrasound without hydronephrosis.   Medications:  Current Facility-Administered Medications  Medication Dose Route Frequency Provider Last Rate Last Admin   lactated ringers infusion   Intravenous Continuous Kommor, [place], MD 125 mL/hr at 12/03/21 1826 New Bag at 12/03/21 1826   Current Outpatient Medications  Medication Sig Dispense Refill   ascorbic acid (VITAMIN C) 500 MG tablet Take by mouth.     benazepril (LOTENSIN) 20 MG tablet Take 10 mg by mouth 2 (two) times daily.     Bismuth Subgallate (DEVROM) 200 MG CHEW Chew 1 tablet by mouth daily.     calcitRIOL (ROCALTROL) 0.5 MCG capsule Take 0.5 mcg by mouth daily.     calcium carbonate 1250 MG capsule Take 1,250 mg by mouth daily.     Cholecalciferol (VITAMIN D3 PO) Take 2,000 Units by mouth daily.     DULoxetine (CYMBALTA) 60 MG capsule Take 60 mg by mouth daily.     ferrous sulfate 325 (65 FE) MG tablet Take 325 mg by mouth daily with breakfast.     gabapentin (NEURONTIN) 100 MG capsule Take three capsules twice a day: Weaning her Gabapentin due to CKD. (Patient taking differently: Take 100 mg by mouth 2 (two) times daily. Take three capsules twice a day: Weaning her Gabapentin due to CKD.) 180 capsule 0   hydrochlorothiazide (MICROZIDE) 12.5 MG capsule Take 1 capsule by mouth daily.     lovastatin (MEVACOR) 20 MG tablet Take 20 mg by mouth every evening.     magnesium oxide (MAG-OX) 400 (240 Mg) MG tablet Take 1 tablet by mouth daily.     Multiple Vitamins-Minerals (BARIATRIC MULTIVITAMINS/IRON) CAPS Take 3 capsules by mouth daily before supper.     nortriptyline (PAMELOR) 75 MG capsule TAKE 1 CAPSULE BY MOUTH AT BEDTIME. 90 capsule 1   omeprazole (PRILOSEC) 10 MG capsule Take 20 mg by mouth daily.     ondansetron (ZOFRAN) 4 MG tablet Take 1 tablet (4 mg total) by mouth 2 (two) times daily as needed for nausea or vomiting. 30 tablet 1   Oxycodone HCl 10 MG TABS Take 1 tablet (10 mg total) by mouth 4 (four) times  daily as needed. Do Not fill Before 11/18/2021 (Patient taking differently: Take 10 mg by mouth in the morning, at noon, in the evening, and at bedtime. Do Not fill Before 11/18/2021) 120 tablet 0   tiZANidine (ZANAFLEX) 4 MG tablet Take 1 tablet (4 mg total) by mouth 5 (five) times daily as needed for muscle spasms. (Patient taking differently: Take 4 mg by mouth in the morning, at noon, in the evening, and at bedtime.) 150 tablet 5   Fingerstix Lancets MISC Use 1 each 2 (two) times daily As directed [Test blood sugars in rotating pattern:  fasting, before meals, 2 hours after a meal, at bedtime]     gabapentin (NEURONTIN) 600 MG tablet Take 1 tablet (600 mg total) by mouth 5 (five) times daily. (Patient not taking: Reported on 12/03/2021) 150 tablet 3   glucose blood test strip USE 1 STRIP TWICE A DAY AS DIRECTED     predniSONE (DELTASONE) 20 MG tablet Take 2 tablets (40 mg total) by mouth daily. (Patient not taking:  Reported on 12/03/2021) 10 tablet 0     ALLERGIES Penicillins  MEDICAL HISTORY Past Medical History:  Diagnosis Date   Anxiety    Chronic kidney disease    stones   Chronic pain    Complex regional pain syndrome I    right foot   Depression    Diabetes mellitus    Hypertension    Kidney stone    Neuromuscular disorder ([place])    Tremor      SOCIAL HISTORY Social History   Socioeconomic History   Marital status: Married    Spouse name: Not on file   Number of children: 2   Years of education: some college   Highest education level: Not on file  Occupational History   Occupation: homemaker  Tobacco Use   Smoking status: Never   Smokeless tobacco: Never  Vaping Use   Vaping Use: Never used  Substance and Sexual Activity   Alcohol use: No    Alcohol/week: 0.0 standard drinks of alcohol   Drug use: No   Sexual activity: Not on file  Other Topics Concern   Not on file  Social History Narrative   Lives at home with husband.   Right-handed.   No daily caffeine  use.   Social Determinants of Health   Financial Resource Strain: Not on file  Food Insecurity: Not on file  Transportation Needs: Not on file  Physical Activity: Not on file  Stress: Not on file  Social Connections: Not on file  Intimate Partner Violence: Not on file     FAMILY HISTORY Family History  Problem Relation Age of Onset   Cancer Mother        multiple myeloma   Diabetes Father    Kidney disease Father    Heart disease Father       Review of Systems: 12 systems reviewed Otherwise as per HPI, all other systems reviewed and negative  Physical Exam: Vitals:   12/03/21 1845 12/03/21 1900  BP: (!) 108/58 (!) 118/57  Pulse: 81 83  Resp: 15 10  Temp:    SpO2: 100% 100%   No intake/output data recorded.  Intake/Output Summary (Last 24 hours) at 12/03/2021 2011 Last data filed at 12/03/2021 1826 Gross per 24 hour  Intake 2000 ml  Output --  Net 2000 ml   General: well-appearing, no acute distress HEENT: anicteric sclera, oropharynx clear without lesions, dry mucous membranes CV: Normal rate, no murmurs, no significant edema Lungs: clear to auscultation bilaterally, normal work of breathing Abd: soft, non-tender, non-distended Skin: pale, otherwise no visible lesions or rashes Psych: alert, engaged, appropriate mood and affect Musculoskeletal: no obvious deformities Neuro: Intermittent twitching of the face and hands, otherwise normal speech, no gross focal deficits   Test Results Reviewed Lab Results  Component Value Date   NA 138 12/03/2021   K 5.5 (H) 12/03/2021   CL 110 12/03/2021   CO2 17 (L) 12/03/2021   BUN 79 (H) 12/03/2021   CREATININE 5.05 (H) 12/03/2021   CALCIUM 8.7 (L) 12/03/2021   ALBUMIN 2.9 (L) 12/03/2021    CBC Recent Labs  Lab 12/03/21 1553 12/03/21 1854  WBC 12.1*  --   NEUTROABS 8.1*  --   HGB 9.0* 8.8*  HCT 28.3* 26.0*  MCV 96.9  --   PLT 205  --     I have reviewed all relevant outside healthcare records related  to the patient's current hospitalization

## 2021-12-03 NOTE — H&P (Incomplete)
Date: 12/03/2021               Patient Name:  Sabrina Williams MRN: [id number]  DOB: 31-Jul-1970 Age / Sex: 51 y.o., female   PCP: Latanya Maudlin, NP         Medical Service: Internal Medicine Teaching Service         Attending Physician: Dr. Charise Killian, MD    First Contact: Romana Juniper, MD Pager: ***  Second Contact: Virl Axe, MD Pager: ***       After Hours (After 5p/  First Contact Pager: [phone]  weekends / holidays): Second Contact Pager: [phone]   SUBJECTIVE   Chief Complaint: hypotension  History of Present Illness:  Pt is a 51 year old female with a hx of chronic pain, depression, CKD IV and prior staghorn calculi, DM2, and obesity who presents with complaints of hypotension and fatigue.   She was at her pain clinic earlier today and complained of dizziness. She was found to be hypotensive in the 26'J systolic and was directed to [facility] ED.  She has had poor PO intake past 2 weeks d/t poor appetite and worsening nausea, but has not had any abdominal pain or vomiting. Endorses change in taste, but no recent viral symptoms. She notes that PO intake worsened shortly after death of a close friend. Also has a history of gastric sleeve (2017) and reflux.  Despite poor PO intake, she has continued to take her home medicines including several blood pressure and chronic pain medications. She has felt thirstier recently, notes that her urine is slightly darker, has a stronger scent, and that she is urinating less often. She also has noticed worsened fatigue in the past 2 weeks and thinks her vision is blurred now.   She reports episodes of sudden tiredness and generalized weakness which she describes as "getting a shot of morphine", however, this is a chronic problem for her. She reports labile BP's for past 6 years. Denies chest pain, SOB, focal weakness, has never fallen or hit her head. She does appear to have a history of post-prandial hypotension. She takes gabapentin for  chronic pain and follows with neurology for chronic muscle spasms/twitching.  Neurology evaluated for episodes of sleepiness after eating and thought this was due to her gabapentin. She was taking 3000 mg of gabapentin daily and has since been weaned down to 200 mg gabapentin. Her Nucynta was also discontinued. She was started on Cymbalta, amitriptyline, and  oxycodone 4 times daily about 3-4 months ago. Since her medicines were changed, she experiences the  sleepiness only every few days, previously it was 1-2 time a day.  2 weeks ago did not feel well due to withdrawling off her pain medications, but this improved with steroids.  While in the ED she received 2L IVFs with some subjective improvement.    Meds:  Hctz Benazepril Oxycodone Zofran - takes about twice a month Multivitamin Calcium  Ferros sulfate Tazidime  Lovastatin Magnesium  Current Meds  Medication Sig  . ascorbic acid (VITAMIN C) 500 MG tablet Take by mouth.  . benazepril (LOTENSIN) 20 MG tablet Take 10 mg by mouth 2 (two) times daily.  . Bismuth Subgallate (DEVROM) 200 MG CHEW Chew 1 tablet by mouth daily.  . calcitRIOL (ROCALTROL) 0.5 MCG capsule Take 0.5 mcg by mouth daily.  . calcium carbonate 1250 MG capsule Take 1,250 mg by mouth daily.  . Cholecalciferol (VITAMIN D3 PO) Take 2,000 Units by mouth daily.  . DULoxetine (  CYMBALTA) 60 MG capsule Take 60 mg by mouth daily.  . ferrous sulfate 325 (65 FE) MG tablet Take 325 mg by mouth daily with breakfast.  . gabapentin (NEURONTIN) 100 MG capsule Take three capsules twice a day: Weaning her Gabapentin due to CKD. (Patient taking differently: Take 100 mg by mouth 2 (two) times daily. Take three capsules twice a day: Weaning her Gabapentin due to CKD.)  . hydrochlorothiazide (MICROZIDE) 12.5 MG capsule Take 1 capsule by mouth daily.  Marland Kitchen lovastatin (MEVACOR) 20 MG tablet Take 20 mg by mouth every evening.  . magnesium oxide (MAG-OX) 400 (240 Mg) MG tablet Take 1 tablet by  mouth daily.  . Multiple Vitamins-Minerals (BARIATRIC MULTIVITAMINS/IRON) CAPS Take 3 capsules by mouth daily before supper.  . nortriptyline (PAMELOR) 75 MG capsule TAKE 1 CAPSULE BY MOUTH AT BEDTIME.  Marland Kitchen omeprazole (PRILOSEC) 10 MG capsule Take 20 mg by mouth daily.  . ondansetron (ZOFRAN) 4 MG tablet Take 1 tablet (4 mg total) by mouth 2 (two) times daily as needed for nausea or vomiting.  . Oxycodone HCl 10 MG TABS Take 1 tablet (10 mg total) by mouth 4 (four) times daily as needed. Do Not fill Before 11/18/2021 (Patient taking differently: Take 10 mg by mouth in the morning, at noon, in the evening, and at bedtime. Do Not fill Before 11/18/2021)  . tiZANidine (ZANAFLEX) 4 MG tablet Take 1 tablet (4 mg total) by mouth 5 (five) times daily as needed for muscle spasms. (Patient taking differently: Take 4 mg by mouth in the morning, at noon, in the evening, and at bedtime.)    Past Medical History:  Diagnosis Date  . Anxiety   . Chronic kidney disease    stones  . Chronic pain   . Complex regional pain syndrome I    right foot  . Depression   . Diabetes mellitus   . Hypertension   . Kidney stone   . Neuromuscular disorder ([place])   . Tremor   Chronic pain, C6-c7 fusion with chornic left arm problems CKD Kidney stones recurrent since 15 R plantar fascitis T2DM now diet controlled for past 7 years Gastric bypass Hypertension   Past Surgical History:  Procedure Laterality Date  . CERVICAL SPINE SURGERY     C6-7 fusion  . CESAREAN SECTION     x2  . dental implant  July 2015  . ENDOSCOPIC PLANTAR FASCIOTOMY Right August 2015  . GASTRIC BYPASS    . HAND SURGERY Right   . LITHOTRIPSY     multiple  . PLANTAR FASCIECTOMY Right   . SHOULDER SURGERY Left 10/2004  . SMALL INTESTINE SURGERY     cervical laminectomy  . 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 Penicillins Hives and Rash 09/07/2011    Review of Systems: A complete ROS was negative except as per HPI.   OBJECTIVE:   Physical Exam: Blood pressure (!) 118/57, pulse 83, temperature 98.5 F (36.9 C), temperature source Oral, resp. rate 10, height 5\' 7"  (1.702 m), weight 123.4  kg, last menstrual period 12/02/2019, SpO2 100 %.  Constitutional: well-appearing female sitting in bed, in no acute distress HENT: normocephalic atraumatic, mucous membranes moist, tongue fasciculations Eyes: conjunctiva non-erythematous Neck: supple Cardiovascular: regular rate and rhythm, systolic murmur Pulmonary/Chest: normal work of breathing on room air, lungs clear to auscultation bilaterally Abdominal: soft, non-tender, non-distended MSK: normal bulk and tone Neurological: alert & oriented x 3, 5/5 strength in bilateral upper and lower extremities, periodic muscle twitches of face and extremities, CN grossly intact, EOM intact, no visual field deficits, speech fluent without slurring. No dysmetria or dysdiadochokinesis.  Skin: warm and dry Psych: pleasant  Labs: CBC    Component Value Date/Time   WBC 12.1 (H) 12/03/2021 1553   RBC 2.92 (L) 12/03/2021 1553   HGB 8.8 (L) 12/03/2021 1854   HGB 12.4 04/02/2019 1436   HCT 26.0 (L) 12/03/2021 1854   HCT 38.1 04/02/2019 1436   PLT 205 12/03/2021 1553   PLT 258 04/02/2019 1436   MCV 96.9 12/03/2021 1553   MCV 94 04/02/2019 1436   MCH 30.8 12/03/2021 1553   MCHC 31.8 12/03/2021 1553   RDW 11.9 12/03/2021 1553   RDW 12.2 04/02/2019 1436   LYMPHSABS 2.8 12/03/2021 1553   LYMPHSABS 3.2 (H)  04/02/2019 1436   MONOABS 0.8 12/03/2021 1553   EOSABS 0.4 12/03/2021 1553   EOSABS 0.5 (H) 04/02/2019 1436   BASOSABS 0.0 12/03/2021 1553   BASOSABS 0.1 04/02/2019 1436     CMP     Component Value Date/Time   NA 138 12/03/2021 1854   NA 141 10/27/2021 1632   K 5.5 (H) 12/03/2021 1854   CL 110 12/03/2021 1553   CO2 17 (L) 12/03/2021 1553   GLUCOSE 100 (H) 12/03/2021 1553   BUN 79 (H) 12/03/2021 1553   BUN 38 (H) 10/27/2021 1632   CREATININE 5.05 (H) 12/03/2021 1553   CALCIUM 8.7 (L) 12/03/2021 1553   PROT 5.8 (L) 12/03/2021 1553   PROT 6.2 10/27/2021 1632   ALBUMIN 2.9 (L) 12/03/2021 1553   ALBUMIN 3.9 10/27/2021 1632   AST 20 12/03/2021 1553   ALT 22 12/03/2021 1553   ALKPHOS 86 12/03/2021 1553   BILITOT 0.4 12/03/2021 1553   BILITOT <0.2 10/27/2021 1632   GFRNONAA 10 (L) 12/03/2021 1553   GFRAA 29 (L) 04/02/2019 1436    Imaging: CT Renal Stone Study  Result Date: 12/03/2021 CLINICAL DATA:  Renal stones, pain EXAM: CT ABDOMEN AND PELVIS WITHOUT CONTRAST TECHNIQUE: Multidetector CT imaging of the abdomen and pelvis was performed following the standard protocol without IV contrast. RADIATION DOSE REDUCTION: This exam was performed according to the departmental dose-optimization program which includes automated exposure control, adjustment of the mA and/or kV according to patient size and/or use of iterative reconstruction technique. COMPARISON:  Renal sonogram done earlier today FINDINGS: Lower chest: Unremarkable. Hepatobiliary: No focal abnormalities are seen in the liver. There is no dilation of bile ducts. There is high density in the dependent portion of gallbladder suggesting gallstones. There is no wall thickening. There is no fluid around the gallbladder. Pancreas: No focal abnormalities are seen. Spleen: Unremarkable. Adrenals/Urinary Tract: Adrenals are unremarkable. There is no hydronephrosis. There is a 1.4 cm calculus in the midportion of left kidney. There is a 5 mm  calculus in the lower pole of right kidney. Lobulations are seen in the margins of both kidneys. Evaluation of renal cortex is limited in this noncontrast study. Ureters are not dilated. Urinary bladder is unremarkable. Stomach/Bowel: There is evidence of  previous bariatric surgery with surgical staples in the stomach. Small hiatal hernia is seen. Small bowel loops are not dilated. The appendix is difficult to visualize. In image 57 of series 5, there is small caliber tubular structure adjacent to the tip of the cecum, possibly normal appendix. There is no pericecal inflammation. Scattered diverticula are seen in colon. There is no evidence of focal acute diverticulitis. Moderate to large amount of stool is seen in colon and rectum. Vascular/Lymphatic: Scattered arterial calcifications are seen. There are subcentimeter lymph nodes in mesentery. There is minimal stranding in the mesenteric fat. There is no loculated fluid collection in the mesentery. Reproductive: Unremarkable. Other: There is no ascites or pneumoperitoneum. There is a small umbilical hernia containing fat. Musculoskeletal: Degenerative changes are noted in lumbar spine with encroachment of neural foramina, more so at L5-S1 level. IMPRESSION: There is no evidence of intestinal obstruction or pneumoperitoneum. There is no hydronephrosis. There are bilateral nonobstructing renal stones. Gallbladder stones. Few diverticula are seen in the colon without signs of focal diverticulitis. Small hiatal hernia. Other findings as described in the body of the report. Electronically Signed   By: Elmer Picker M.D.   On: 12/03/2021 19:49   US RENAL  Result Date: 12/03/2021 CLINICAL DATA:  Azotemia EXAM: RENAL / URINARY TRACT ULTRASOUND COMPLETE COMPARISON:  Renal ultrasound 08/05/2021 FINDINGS: Right Kidney: Renal measurements: 11.2 x 5.2 x 5.1 cm = volume: 153 mL. Echogenicity is increased, unchanged. No hydronephrosis. Left Kidney: Renal measurements:  10.7 x 4.2 x 5.0 cm = volume: 116 mL. Echogenicity is increased, unchanged. No hydronephrosis. There is a 1 cm shadowing calculus in the left kidney. Bladder: Appears normal for degree of bladder distention. Other: None. IMPRESSION: 1. Echogenic kidneys likely related to medical renal disease. 2. No hydronephrosis. 3. Stable left renal calculus. Electronically Signed   By: Ronney Asters M.D.   On: 12/03/2021 18:22    EKG: personally reviewed my interpretation isNSR   ASSESSMENT & PLAN:    Assessment & Plan by Problem: Principal Problem:   AKI (acute kidney injury) ([place])   LEEBA BARBE is a 51 y.o. with pertinent PMH of chronic pain, depression, CKD IV, DM2, obesity who presented with hypotension and admitted for AKI on hospital day 0  # AKI on CKD 4 # Hypotension # Non-anion gap Metabolic acidosis likely due to CKD - Cr noted to be 5.1 in ED, baseline ~ 2.5.  - AKI likely prerenal due to hypotension in the setting of dehydration from reduced PO intake and continued BP and pain medication usage. - PO fluids and IVFs as necessary.  - Monitor Daily I+Os and weight - Anticipate AKI improves with fluids. Checking UA to assess for ATN. - Urine studies unlikely to be beneficial s/p 2L fluids. - Her hypotension has improved s/p 2L bolus in ED.  - continue to hold BP and pain meds for now.  - Started on sodium bicarb 650 BID.   # Chronic pain s/p C6-7 spinal fusion # Depression # Myoclonic jerking - Follows with phys med for chronic pain who prescribed Gabapentin, Oxycodone Q4 hrs Tazidime  - muscle twitching felt to be related to build up of pain medications in the setting of AKI. These are being held.  - We are holding gabapentin, nortriptyline, and oxycodone.  - Have switched to PO dilaudid for now until AKI resolves to prevent opioid withdrawal.  - scheduled tylenol - will need to employ non-pharmacologic strategies and non-systemic medications as necessary until AKI resolves  #  Hyperkalemia - potassium 5.5 in ED, likely related to reduced renal funciton.  - Peaked T waves on EKG, but otherwise stable.  - Daily BMP to monitor for improvement with fluids - telemetry  Normocytic Anemia - Hgb noted to be 9 prior to fluid administration.  - Likely related to her CKD. No reported GIB - continue iron supplementation. - Iron studies ordered, will follow up.    DM2 - not currently taking anything. - check A1c. May need to start on SSI   Obesity GERD - history of bariatric surgery (2017)  - Prilosec for reflux - Zofran for nausea         Hypotension - poor po intake and continued diuretics - Gabapentin and worsening renal funciton - hx of post-prandial hypotension  AKI - ? Prerenal?  Anemia ? dilutional  Metabolic acidosis Non-anion gap - not uremia?  hyperkalemia  Uremia myoclonus  Azotemia and symptomatic uremia Presented from pain clinic. responded well to 2l BUN and cr have gone up Hx staghorn but [place] negative, nephrology consulted  Diet: {NAMES:3044014::"Normal","Heart Healthy","Carb-Modified","Renal","Carb/Renal","NPO","TPN","Tube Feeds"} VTE: {NAMES:3044014::"Heparin","Enoxaparin","SCDs","NOAC","None"} IVF: {NAMES:3044014::"None","NS","1/2 NS","LR","D5","D10"},{NAMES:3044014::"None","10cc/hr","25cc/hr","50cc/hr","75cc/hr","100cc/hr","110cc/hr","125cc/hr","Bolus"} Code: {NAMES:3044014::"Full","DNR","DNI","DNR/DNI","Comfort Care","Unknown"}  Prior to Admission Living Arrangement: {NAMES:3044014::"Home, living ***","SNF, ***","Homeless","***"} Anticipated Discharge Location: {NAMES:3044014::"Home","SNF","CIR","***"} Barriers to Discharge: ***  Dispo: Admit patient to {STATUS:3044014::"Observation with expected length of stay less than 2 midnights.","Inpatient with expected length of stay greater than 2 midnights."}  Signed: Delene Ruffini, MD Internal Medicine Resident PGY-1 12/03/2021, 10:38 PM

## 2021-12-03 NOTE — Hospital Course (Addendum)
Feels a lot better this morning, not as confused and lightheaded. She is no longer as thirsty and her appetite is coming back. At home, was not urinating much recently for at least a week. Went a few times overnight to urinate here. She was having poor PO intake for about 1-2 weeks.  She was on 3000mg  of gabapentin 4 months ago, slowly titrating that down. Was at 200mg  since 2 weeks ago.  No nausea, vomiting, abdominal pain, dysuria, urinary urgency, melena, hematochezia.  Has had headaches daily for the past week or so, which is new. Not having one currently.  Has had muscle twitches frequently, were improving with decreasing gabapentin. Started back up about 2 weeks ago, were stable but worsened over the past 4 days.  Was told she had a heart murmur about 20+ years ago. No chest pain or palpitations.  Last BM yesterday night.  __________________________________________________ Hypotension  Poor PO Continued hctz  Pain mgmt Gabapentin Oxycodone Q4 hrs?  Followed by neuro for myoclonic jerking 2/2 gabapentin  Hypotension - poor po intake and continued diuretics - Gabapentin and worsening renal funciton - hx of post-prandial hypotension  AKI - ? Prerenal?  Anemia ? dilutional  Metabolic acidosis Non-anion gap - not uremia?  hyperkalemia  Uremia myoclonus  Azotemia and symptomatic uremia Presented from pain clinic. responded well to 2l BUN and cr have gone up Hx staghorn but [place] negative, nephrology consulted

## 2021-12-04 DIAGNOSIS — N179 Acute kidney failure, unspecified: Secondary | ICD-10-CM | POA: Diagnosis not present

## 2021-12-04 LAB — RENAL FUNCTION PANEL
Albumin: 2.9 g/dL — ABNORMAL LOW (ref 3.5–5.0)
Anion gap: 9 (ref 5–15)
BUN: 70 mg/dL — ABNORMAL HIGH (ref 6–20)
CO2: 16 mmol/L — ABNORMAL LOW (ref 22–32)
Calcium: 8.3 mg/dL — ABNORMAL LOW (ref 8.9–10.3)
Chloride: 115 mmol/L — ABNORMAL HIGH (ref 98–111)
Creatinine, Ser: 4.47 mg/dL — ABNORMAL HIGH (ref 0.44–1.00)
GFR, Estimated: 11 mL/min — ABNORMAL LOW (ref >60)
Glucose, Bld: 76 mg/dL (ref 70–99)
Phosphorus: 6.2 mg/dL — ABNORMAL HIGH (ref 2.5–4.6)
Potassium: 4.9 mmol/L (ref 3.5–5.1)
Sodium: 140 mmol/L (ref 135–145)

## 2021-12-04 LAB — CBC
HCT: 27.9 % — ABNORMAL LOW (ref 36.0–46.0)
Hemoglobin: 8.7 g/dL — ABNORMAL LOW (ref 12.0–15.0)
MCH: 30.6 pg (ref 26.0–34.0)
MCHC: 31.2 g/dL (ref 30.0–36.0)
MCV: 98.2 fL (ref 80.0–100.0)
Platelets: 216 10*3/uL (ref 150–400)
RBC: 2.84 MIL/uL — ABNORMAL LOW (ref 3.87–5.11)
RDW: 11.9 % (ref 11.5–15.5)
WBC: 11.6 10*3/uL — ABNORMAL HIGH (ref 4.0–10.5)
nRBC: 0 % (ref 0.0–0.2)

## 2021-12-04 LAB — URINALYSIS, ROUTINE W REFLEX MICROSCOPIC
Bilirubin Urine: NEGATIVE
Glucose, UA: NEGATIVE mg/dL
Hgb urine dipstick: NEGATIVE
Ketones, ur: NEGATIVE mg/dL
Nitrite: NEGATIVE
Protein, ur: NEGATIVE mg/dL
Specific Gravity, Urine: 1.009 (ref 1.005–1.030)
WBC, UA: >50 WBC/hpf — ABNORMAL HIGH (ref 0–5)
pH: 5 (ref 5.0–8.0)

## 2021-12-04 LAB — HEMOGLOBIN A1C
Hgb A1c MFr Bld: 5.5 % (ref 4.8–5.6)
Mean Plasma Glucose: 111.15 mg/dL

## 2021-12-04 MED ORDER — LACTATED RINGERS IV SOLN: INTRAVENOUS | Status: AC

## 2021-12-04 NOTE — ED Notes (Signed)
Patient ambulated to bathroom.  Steady gait noted.  Patient has used restroom multiple times during the night.  Reports she is completely emptying bladder.  Does not feel that she needs a bladder scan at this time

## 2021-12-04 NOTE — ED Notes (Signed)
Provider came to my desk and communicated that patients call bell did not work, Event organiser with [employer] Bloodgood and she is placing a call to get it fixed.

## 2021-12-04 NOTE — Progress Notes (Signed)
Pt arrived to floor around 1515 via stretcher by transporter. Pt amb from stretcher to hospital bed with steady gait. VSS. Respirations even and unlabored on room air. Pt oriented to room. Call bell within reach. Bed in low position.

## 2021-12-04 NOTE — Progress Notes (Signed)
[facility] Progress Note   Subjective:   Feels globally improved this AM but not at baseline.  Drinking ok but still feels 'dry' and solid food fairly unappealing.  No emesis, diarrhea.  Jerking movements ~ resolved.  No dyspnea.    Husband bedside.  Objective Vitals:   12/04/21 0500 12/04/21 0600 12/04/21 0700 12/04/21 0900  BP: (!) 95/46 107/73 108/68 113/69  Pulse: 70 62 71 71  Resp: 11 14 13 11   Temp:      TempSrc:      SpO2: 96% 100% 99% 99%  Weight:      Height:       Physical Exam General: obese woman who appears comfortable sitting up in bed ENT: MM tacky Heart: RRR Lungs: clear Abdomen: soft, obese Extremities: trace hand edema, no pedal edema Neuro:  no tremor except tongue when protrudes, conversant and attentive to detail  Additional Objective Labs: Basic Metabolic Panel: Recent Labs  Lab 12/03/21 1553 12/03/21 1854 12/04/21 0215  NA 137 138 140  K 5.6* 5.5* 4.9  CL 110  --  115*  CO2 17*  --  16*  GLUCOSE 100*  --  76  BUN 79*  --  70*  CREATININE 5.05*  --  4.47*  CALCIUM 8.7*  --  8.3*  PHOS  --   --  6.2*   Liver Function Tests: Recent Labs  Lab 12/03/21 1553 12/04/21 0215  AST 20  --   ALT 22  --   ALKPHOS 86  --   BILITOT 0.4  --   PROT 5.8*  --   ALBUMIN 2.9* 2.9*   No results for input(s): "LIPASE", "AMYLASE" in the last 168 hours. CBC: Recent Labs  Lab 12/03/21 1553 12/03/21 1854 12/04/21 0215  WBC 12.1*  --  11.6*  NEUTROABS 8.1*  --   --   HGB 9.0* 8.8* 8.7*  HCT 28.3* 26.0* 27.9*  MCV 96.9  --  98.2  PLT 205  --  216   Blood Culture    Component Value Date/Time   SDES THROAT 11/03/2016 1123   SPECREQUEST NONE Reflexed from [id number] 11/03/2016 1123   CULT  11/03/2016 1123    NO GROUP A STREP (S.PYOGENES) ISOLATED Performed at [facility] [address] [place], [place] [address]    REPTSTATUS 11/06/2016 FINAL 11/03/2016 1123    Cardiac Enzymes: No results for input(s): "CKTOTAL", "CKMB",  "CKMBINDEX", "TROPONINI" in the last 168 hours. CBG: No results for input(s): "GLUCAP" in the last 168 hours. Iron Studies: No results for input(s): "IRON", "TIBC", "TRANSFERRIN", "FERRITIN" in the last 72 hours. @lablastinr3 @ Studies/Results: CT Renal Stone Study  Result Date: 12/03/2021 CLINICAL DATA:  Renal stones, pain EXAM: CT ABDOMEN AND PELVIS WITHOUT CONTRAST TECHNIQUE: Multidetector CT imaging of the abdomen and pelvis was performed following the standard protocol without IV contrast. RADIATION DOSE REDUCTION: This exam was performed according to the departmental dose-optimization program which includes automated exposure control, adjustment of the mA and/or kV according to patient size and/or use of iterative reconstruction technique. COMPARISON:  Renal sonogram done earlier today FINDINGS: Lower chest: Unremarkable. Hepatobiliary: No focal abnormalities are seen in the liver. There is no dilation of bile ducts. There is high density in the dependent portion of gallbladder suggesting gallstones. There is no wall thickening. There is no fluid around the gallbladder. Pancreas: No focal abnormalities are seen. Spleen: Unremarkable. Adrenals/Urinary Tract: Adrenals are unremarkable. There is no hydronephrosis. There is a 1.4 cm calculus in the  midportion of left kidney. There is a 5 mm calculus in the lower pole of right kidney. Lobulations are seen in the margins of both kidneys. Evaluation of renal cortex is limited in this noncontrast study. Ureters are not dilated. Urinary bladder is unremarkable. Stomach/Bowel: There is evidence of previous bariatric surgery with surgical staples in the stomach. Small hiatal hernia is seen. Small bowel loops are not dilated. The appendix is difficult to visualize. In image 57 of series 5, there is small caliber tubular structure adjacent to the tip of the cecum, possibly normal appendix. There is no pericecal inflammation. Scattered diverticula are seen in colon.  There is no evidence of focal acute diverticulitis. Moderate to large amount of stool is seen in colon and rectum. Vascular/Lymphatic: Scattered arterial calcifications are seen. There are subcentimeter lymph nodes in mesentery. There is minimal stranding in the mesenteric fat. There is no loculated fluid collection in the mesentery. Reproductive: Unremarkable. Other: There is no ascites or pneumoperitoneum. There is a small umbilical hernia containing fat. Musculoskeletal: Degenerative changes are noted in lumbar spine with encroachment of neural foramina, more so at L5-S1 level. IMPRESSION: There is no evidence of intestinal obstruction or pneumoperitoneum. There is no hydronephrosis. There are bilateral nonobstructing renal stones. Gallbladder stones. Few diverticula are seen in the colon without signs of focal diverticulitis. Small hiatal hernia. Other findings as described in the body of the report. Electronically Signed   By: Elmer Picker M.D.   On: 12/03/2021 19:49   US RENAL  Result Date: 12/03/2021 CLINICAL DATA:  Azotemia EXAM: RENAL / URINARY TRACT ULTRASOUND COMPLETE COMPARISON:  Renal ultrasound 08/05/2021 FINDINGS: Right Kidney: Renal measurements: 11.2 x 5.2 x 5.1 cm = volume: 153 mL. Echogenicity is increased, unchanged. No hydronephrosis. Left Kidney: Renal measurements: 10.7 x 4.2 x 5.0 cm = volume: 116 mL. Echogenicity is increased, unchanged. No hydronephrosis. There is a 1 cm shadowing calculus in the left kidney. Bladder: Appears normal for degree of bladder distention. Other: None. IMPRESSION: 1. Echogenic kidneys likely related to medical renal disease. 2. No hydronephrosis. 3. Stable left renal calculus. Electronically Signed   By: Ronney Asters M.D.   On: 12/03/2021 18:22   Medications:  lactated ringers Stopped (12/04/21 1238)   lactated ringers      acetaminophen  1,000 mg Oral TID   Or   acetaminophen  650 mg Rectal TID   calcitRIOL  0.5 mcg Oral Daily   calcium  carbonate  1,250 mg Oral Daily   ferrous sulfate  325 mg Oral Q breakfast   heparin  5,000 Units Subcutaneous Q8H   pantoprazole  40 mg Oral Daily   pravastatin  10 mg Oral q1800   sodium bicarbonate  650 mg Oral BID    Assessment/Recommendations: Sabrina Williams is a/an 51 y.o. female with a past medical history chronic pain, staghorn calculi, dm2, chronic pain, CKD IV  who present w/ hypotension   Hypotension: Most likely related to dehydration based on the patient's history.  Already improved with hydration.  Continue with IV fluids and hold home blood pressure medications.    Jerking: Possibly myoclonic.  I think this is less likely uremia based on the patient's history.  She has had similar jerking before when her kidney function was at her baseline.  I think what is more likely is that she had an AKI which caused decreased clearance of her pain medications which then caused jerking.  This has improved.  -Treat AKI as below -Renal function is  improving so could consider adding back her TCA and gabapentin at 100 if needed.    AKI on CKD 4: Most likely secondary to dehydration.  Follows with Dr. Holley Raring for CKD mgmt. Baseline 2.4-2.6. No hydro on RUS -Continue with IVFs and encourage po hydration -Hold home benazepril and hydrochlorothiazide -Continue to monitor daily Cr, Dose meds for GFR -Monitor Daily I/Os, Daily weight  -Maintain MAP>65 for optimal renal perfusion.  -Avoid nephrotoxic medications including NSAIDs -Use synthetic opioids (Fentanyl/Dilaudid) if needed -Currently no indication for HD  Possible UTI - asymptomatic but UA ? UTI -culture pending, await result   Hyperkalemia: resolved with fluids.  CTM.    Metabolic acidosis: Secondary to AKI. Bicarb 16.  Started sodium bicarbonate 650 mg twice daily   Chronic pain: Holding some medications as above  Anxiety:  cymbalta is on hold and she's inquiring for such - defer to primary; I think after 1 held dose it would be  resonable to resume at 30 daily (assuming home dose 60 daily per chart) since it's a long term med and renal function is improving   Anemia: hgb 8.8. Likely multifactorial. Iron indices ordered.  Jannifer Hick MD 12/04/2021, 12:59 PM  [facility] Pager: [phone]

## 2021-12-04 NOTE — Progress Notes (Signed)
PT Cancellation Note  Patient Details Name: Sabrina Williams MRN: [id number] DOB: Jan 05, 1971   Cancelled Treatment:    Reason Eval/Treat Not Completed: PT screened, no needs identified, will sign off. Please reconsult if new needs arise.  Mabeline Caras, PT, DPT Acute Rehabilitation Services  Personal: [facility] Office: [place] 12/04/2021, 4:45 PM

## 2021-12-04 NOTE — TOC Initial Note (Signed)
Transition of Care [facility]) - Initial/Assessment Note    Patient Details  Name: Sabrina Williams MRN: [id number] Date of Birth: [birth year]  Transition of Care [facility] CM/SW Contact:    Verdell Carmine, RN Phone Number: 12/04/2021, 3:16 PM  Clinical Narrative:                  Transition of care has reviewed the patient chart and there are no needs evident at this time. If a need is identified, please place a TOC consult.        Patient Goals and CMS Choice        Expected Discharge Plan and Services                                                Prior Living Arrangements/Services                       Activities of Daily Living      Permission Sought/Granted                  Emotional Assessment              Admission diagnosis:  AKI (acute kidney injury) ([place]) [N17.9] Hypotension, unspecified hypotension type [I95.9] Patient Active Problem List   Diagnosis Date Noted   AKI (acute kidney injury) ([place]) 12/03/2021   Sleep paralysis, recurrent isolated 09/03/2021   Status post bariatric surgery 09/03/2021   Postural dizziness with presyncope 09/03/2021   Hypersomnia, periodic 09/03/2021   Class 3 severe obesity due to excess calories with body mass index (BMI) of 40.0 to 44.9 in adult ([place]) 09/03/2021   Obstructive sleep apnea 08/06/2021   Dizziness 08/06/2021   Achilles tendinitis 07/31/2021   Calcaneal spur of right foot 07/14/2021   Acquired pes planus of right foot 06/24/2021   Metatarsalgia of right foot 06/24/2021   Sprain of foot 06/24/2021   Gastroesophageal reflux disease without esophagitis 05/29/2021   Vagal nerve sensitivity 05/29/2021   Alopecia 01/02/2021   Anemia 01/02/2021   Elevated parathyroid hormone 01/02/2021   Hypomagnesemia 01/02/2021   Postprandial hypoglycemia 10/14/2020   Myoclonic jerking 04/02/2019   Benign essential hypertension 03/21/2019   Stage 3 chronic kidney disease ([place]) 03/21/2019    Acquired trigger finger 05/02/2018   Lump on finger 05/02/2018   Anxiety and depression 08/17/2017   Cervical radiculitis 06/03/2017   Heart murmur, systolic 78/67/6720   Alkaline phosphatase elevation 02/21/2017   History of weight loss surgery 02/17/2017   Vitamin D deficiency 11/24/2015   Steatosis of liver 11/14/2015   Diabetes mellitus ([place]) 09/23/2015   Pure hypercholesterolemia 09/23/2015   Multiple joint pain 07/07/2015   Muscle pain 07/07/2015   Right shoulder pain 12/05/2014   Complex regional pain syndrome type 1 of right lower extremity 07/05/2014   Increased frequency of urination 08/21/2013   Kidney stone 07/04/2012   Cervical postlaminectomy syndrome 09/07/2011   Cervical radiculopathy at C6 09/07/2011   PCP:  Latanya Maudlin, NP Pharmacy:   [id number] - [place], [place] [address] Phone: [phone] Fax: [phone]     Social Determinants of Health (SDOH) Interventions    Readmission Risk Interventions     No data to display

## 2021-12-04 NOTE — Progress Notes (Signed)
Hospital day#1 Subjective:     Overnight Events: no acute events   Additional information to HPI - patient felt aching body pain and chills, like "when you have the flu" prior to the past 2-week course of decreased appetite and food intake. She then noticed that she was not eating or peeing enough. She tried to eat but was not "into it". She started becoming progressively weaker and started experiencing jerking movements that has previously improved when her pain docs decreased the dose of gabapentin.   Patient denies contact with sick people. Endorses appetite this morning and overall improvement since presentation. Has been able to pee more and sleep. Has not experienced jerking moves since presentation. Inquired about pain medications and is okay with Dilaudid in the setting of the acute kidney injury.   Objective:  Vital signs in last 24 hours: Vitals:   12/04/21 0600 12/04/21 0700 12/04/21 0900 12/04/21 1515  BP: 107/73 108/68 113/69 113/69  Pulse: 62 71 71 66  Resp: 14 13 11 13   Temp:    98.3 F (36.8 C)  TempSrc:    Oral  SpO2: 100% 99% 99% 98%  Weight:    122.3 kg  Height:    5\' 7"  (1.702 m)   Supplemental O2: Room Air SpO2: 98 % Filed Weights   12/03/21 1543 12/04/21 1515  Weight: 123.4 kg 122.3 kg    Physical Exam:  Constitutional: Ill -appearing woman sitting in bed, in no acute distress HENT: normocephalic atraumatic, moist mucous membranes Cardiovascular: regular rate and rhythm, no m/r/g Pulmonary/Chest: normal work of breathing on room air, lungs clear to auscultation bilaterally. No wheezing Abdominal: normal BS, soft, non-tender, non-distended.  MSK: normal bulk and tone. No Pitting edema. Did not appreciate jerking motions during physical exam Neurological: alert & oriented x 3 Skin: warm and dry Psych: appropriate mood and affect    Intake/Output Summary (Last 24 hours) at 12/04/2021 1659 Last data filed at 12/04/2021 1238 Gross per 24 hour   Intake 3185.21 ml  Output --  Net 3185.21 ml   Net IO Since Admission: 4,185.21 mL [12/04/21 1659]  Pertinent Labs:    Latest Ref Rng & Units 12/04/2021    2:15 AM 12/03/2021    6:54 PM 12/03/2021    3:53 PM  CBC  WBC 4.0 - 10.5 K/uL 11.6   12.1   Hemoglobin 12.0 - 15.0 g/dL 8.7  8.8  9.0   Hematocrit 36.0 - 46.0 % 27.9  26.0  28.3   Platelets 150 - 400 K/uL 216   205        Latest Ref Rng & Units 12/04/2021    2:15 AM 12/03/2021    6:54 PM 12/03/2021    3:53 PM  CMP  Glucose 70 - 99 mg/dL 76   100   BUN 6 - 20 mg/dL 70   79   Creatinine 0.44 - 1.00 mg/dL 4.47   5.05   Sodium 135 - 145 mmol/L 140  138  137   Potassium 3.5 - 5.1 mmol/L 4.9  5.5  5.6   Chloride 98 - 111 mmol/L 115   110   CO2 22 - 32 mmol/L 16   17   Calcium 8.9 - 10.3 mg/dL 8.3   8.7   Total Protein 6.5 - 8.1 g/dL   5.8   Total Bilirubin 0.3 - 1.2 mg/dL   0.4   Alkaline Phos 38 - 126 U/L   86   AST 15 - 41 U/L  20   ALT 0 - 44 U/L   22     Imaging: CT Renal Stone Study  Result Date: 12/03/2021 CLINICAL DATA:  Renal stones, pain EXAM: CT ABDOMEN AND PELVIS WITHOUT CONTRAST TECHNIQUE: Multidetector CT imaging of the abdomen and pelvis was performed following the standard protocol without IV contrast. RADIATION DOSE REDUCTION: This exam was performed according to the departmental dose-optimization program which includes automated exposure control, adjustment of the mA and/or kV according to patient size and/or use of iterative reconstruction technique. COMPARISON:  Renal sonogram done earlier today FINDINGS: Lower chest: Unremarkable. Hepatobiliary: No focal abnormalities are seen in the liver. There is no dilation of bile ducts. There is high density in the dependent portion of gallbladder suggesting gallstones. There is no wall thickening. There is no fluid around the gallbladder. Pancreas: No focal abnormalities are seen. Spleen: Unremarkable. Adrenals/Urinary Tract: Adrenals are unremarkable. There is no  hydronephrosis. There is a 1.4 cm calculus in the midportion of left kidney. There is a 5 mm calculus in the lower pole of right kidney. Lobulations are seen in the margins of both kidneys. Evaluation of renal cortex is limited in this noncontrast study. Ureters are not dilated. Urinary bladder is unremarkable. Stomach/Bowel: There is evidence of previous bariatric surgery with surgical staples in the stomach. Small hiatal hernia is seen. Small bowel loops are not dilated. The appendix is difficult to visualize. In image 57 of series 5, there is small caliber tubular structure adjacent to the tip of the cecum, possibly normal appendix. There is no pericecal inflammation. Scattered diverticula are seen in colon. There is no evidence of focal acute diverticulitis. Moderate to large amount of stool is seen in colon and rectum. Vascular/Lymphatic: Scattered arterial calcifications are seen. There are subcentimeter lymph nodes in mesentery. There is minimal stranding in the mesenteric fat. There is no loculated fluid collection in the mesentery. Reproductive: Unremarkable. Other: There is no ascites or pneumoperitoneum. There is a small umbilical hernia containing fat. Musculoskeletal: Degenerative changes are noted in lumbar spine with encroachment of neural foramina, more so at L5-S1 level. IMPRESSION: There is no evidence of intestinal obstruction or pneumoperitoneum. There is no hydronephrosis. There are bilateral nonobstructing renal stones. Gallbladder stones. Few diverticula are seen in the colon without signs of focal diverticulitis. Small hiatal hernia. Other findings as described in the body of the report. Electronically Signed   By: Elmer Picker M.D.   On: 12/03/2021 19:49   US RENAL  Result Date: 12/03/2021 CLINICAL DATA:  Azotemia EXAM: RENAL / URINARY TRACT ULTRASOUND COMPLETE COMPARISON:  Renal ultrasound 08/05/2021 FINDINGS: Right Kidney: Renal measurements: 11.2 x 5.2 x 5.1 cm = volume: 153  mL. Echogenicity is increased, unchanged. No hydronephrosis. Left Kidney: Renal measurements: 10.7 x 4.2 x 5.0 cm = volume: 116 mL. Echogenicity is increased, unchanged. No hydronephrosis. There is a 1 cm shadowing calculus in the left kidney. Bladder: Appears normal for degree of bladder distention. Other: None. IMPRESSION: 1. Echogenic kidneys likely related to medical renal disease. 2. No hydronephrosis. 3. Stable left renal calculus. Electronically Signed   By: Ronney Asters M.D.   On: 12/03/2021 18:22    Assessment/Plan:   Principal Problem:   AKI (acute kidney injury) [facility])   Patient Summary: Sabrina Williams is a 51 y.o. with a pertinent PMH of CKD 4, recurrent nephrolithiasis, DMT2, chronic pain, anxiety, depression, HTN, H LD, hepatic steatosis, and OSA who presented with hypotension and admitted for AKI now resolving   AKI on  CKD 4 Symptomatic Hypotension Non-anion gap Metabolic acidosis likely due to CKD Crr 2.5 at baseline.  Cr. 5.1 on admission.  Patient also noted to be hypotensive.  Renal ultrasound unremarkable, CT renal  1.3 centimeter stone that was unchanged from prior and without hydronephrosis.  Patient received IV fluids in the ED, with improvement of Cr and BP.  This is likely prerenal in the setting of poor p.o. intake as pt reported 2-week history of poor p.o. intake decreased urination with preceding increased fatigue, full body aches.  Patient denies being around sick people; however it is possible that patient had a viral infection that could have contributed to decreased p.o. intake.  Patient with increased appetite this morning, hungry, and eating breakfast.  No nausea associated.  Nephrology following, appreciate recommendations -Encourage p.o. intake -Continue to trend BMP Continue on sodium bicarb 650 mg twice daily  Chronic pain s/p C6-7 spinal fusion Depression Myoclonic jerking Patient follows outpatient physical medicine for chronic pain.  Patient has been  lowering her dose of gabapentin for the past 4 months as patient had developed muscle twitching that seem to be getting better with the lower dose of gabapentin.  Muscle twitching has returned and has been consistent over the past 2 weeks.  Likely lower p.o. intake, electrolyte abnormalities, in the setting of CKD 4, contributing to this acute presentation.  Concerning that there has been a buildup of by medication with the AKI.  Patient's uremia could also be a contributing factor.  Did not appreciate jerking motions during physical exam -Continue to hold gabapentin and TCA antidepressant -Hold oxycodone now receiving Dilaudid 1 mg every 6 hours -On scheduled Tylenol -Holding Cymbalta    Hyperkalemia Admission potassium 5.5 with peaked T waves on EKG. -Repeat potassium 4.9 after fluid administration -Monitor BMP  Normocytic Anemia No signs or symptoms of bleeding on presentation.  Hemoglobin 9.0> 8.8 > 8.7.  2 years ago 12.4.  Hemoglobin seems to be stable, there are no acute signs of bleeding today.  Will reach out to outpatient nephrologist to assess patient's baseline -Follow iron studies -Monitor CBCs  DM2 Not currently on medications.  Hemoglobin A1c 5.5%.  Blood sugars 111 this morning  GERD Bariatric surgery in 2017. -Continue Prilosec for reflux  Nephrolithiasis CT renal stone study with bilateral nonobstructing renal stones measuring 1.4 cm and 5 mm. -Continue to monitor  Diet: Carb/Renal VTE: Heparin IVF: None,None Code: Full   Dispo: Anticipated discharge to Home in 1 days pending clinical improvement.   Romana Juniper, MD Internal Medicine Resident PGY-1 Please contact the on call pager after 5 pm and on weekends at [phone].

## 2021-12-05 DIAGNOSIS — N179 Acute kidney failure, unspecified: Secondary | ICD-10-CM

## 2021-12-05 LAB — POTASSIUM: Potassium: 5.2 mmol/L — ABNORMAL HIGH (ref 3.5–5.1)

## 2021-12-05 LAB — CBC
HCT: 26.9 % — ABNORMAL LOW (ref 36.0–46.0)
Hemoglobin: 8.6 g/dL — ABNORMAL LOW (ref 12.0–15.0)
MCH: 30.3 pg (ref 26.0–34.0)
MCHC: 32 g/dL (ref 30.0–36.0)
MCV: 94.7 fL (ref 80.0–100.0)
Platelets: 188 10*3/uL (ref 150–400)
RBC: 2.84 MIL/uL — ABNORMAL LOW (ref 3.87–5.11)
RDW: 11.6 % (ref 11.5–15.5)
WBC: 6.2 10*3/uL (ref 4.0–10.5)
nRBC: 0 % (ref 0.0–0.2)

## 2021-12-05 LAB — BASIC METABOLIC PANEL
Anion gap: 7 (ref 5–15)
BUN: 62 mg/dL — ABNORMAL HIGH (ref 6–20)
CO2: 17 mmol/L — ABNORMAL LOW (ref 22–32)
Calcium: 8.5 mg/dL — ABNORMAL LOW (ref 8.9–10.3)
Chloride: 113 mmol/L — ABNORMAL HIGH (ref 98–111)
Creatinine, Ser: 3.71 mg/dL — ABNORMAL HIGH (ref 0.44–1.00)
GFR, Estimated: 14 mL/min — ABNORMAL LOW (ref >60)
Glucose, Bld: 85 mg/dL (ref 70–99)
Potassium: 5.6 mmol/L — ABNORMAL HIGH (ref 3.5–5.1)
Sodium: 137 mmol/L (ref 135–145)

## 2021-12-05 LAB — MAGNESIUM: Magnesium: 1.8 mg/dL (ref 1.7–2.4)

## 2021-12-05 MED ORDER — SODIUM ZIRCONIUM CYCLOSILICATE 10 G PO PACK
10.0000 g | PACK | Freq: Every day | ORAL | Status: AC
  Administered 2021-12-05: 10 g via ORAL
  Filled 2021-12-05: qty 1

## 2021-12-05 MED ORDER — DULOXETINE HCL 30 MG PO CPEP
30.0000 mg | ORAL_CAPSULE | Freq: Every day | ORAL | Status: DC
  Administered 2021-12-05 – 2021-12-06 (×2): 30 mg via ORAL
  Filled 2021-12-05 (×2): qty 1

## 2021-12-05 MED ORDER — LACTATED RINGERS IV SOLN: INTRAVENOUS | Status: DC

## 2021-12-05 NOTE — Plan of Care (Signed)

## 2021-12-05 NOTE — Progress Notes (Signed)
OT Cancellation Note  Patient Details Name: DANITA PROUD MRN: [id number] DOB: 21-Dec-1970   Cancelled Treatment:    Reason Eval/Treat Not Completed: OT screened, no needs identified, will sign off.  Kano Heckmann D Rufus Beske 12/05/2021, 9:08 AM 12/05/2021  RP, OTR/L  Acute Rehabilitation Services  Office:  [phone]

## 2021-12-05 NOTE — Progress Notes (Signed)
Hospital day#2 Subjective:   Overnight Events: no acute overnight events.  She is feeling a lot better today. She is tolerating PO intake and has been going to urinate more regularly. Kidney doctor came by this morning and encouraged more oral fluids instead of IV and she is willing to do this to get closer to being able to go home.   Objective:  Vital signs in last 24 hours: Vitals:   12/04/21 2011 12/04/21 2349 12/05/21 0337 12/05/21 0902  BP: 133/74 135/81 130/73 139/89  Pulse: 67 80 80 83  Resp: 18 15 20 14   Temp: 98.2 F (36.8 C) 98.2 F (36.8 C) 98.2 F (36.8 C) 97.7 F (36.5 C)  TempSrc: Oral Oral Oral Oral  SpO2: 99% 100% (!) 88% 100%  Weight:      Height:       Supplemental O2: Room Air SpO2: 100 % Filed Weights   12/03/21 1543 12/04/21 1515  Weight: 123.4 kg 122.3 kg    Physical Exam:  General: middle aged female, laying in bed, NAD. CV: normal rate and regular rhythm, no m/r/g. Pulm: CTABL, normal WOB on RA. Abdomen: soft, nondistended, nontender, normoactive BS. MSK: no peripheral edema noted. No jerking motions during encounter today. Neuro: AAOx3, no focal deficits noted. Skin: warm and dry. Psych: normal mood and affect    Intake/Output Summary (Last 24 hours) at 12/05/2021 1217 Last data filed at 12/04/2021 1800 Gross per 24 hour  Intake 1071.26 ml  Output --  Net 1071.26 ml   Net IO Since Admission: 4,423.14 mL [12/05/21 1217]  Pertinent Labs:    Latest Ref Rng & Units 12/05/2021    1:46 AM 12/04/2021    2:15 AM 12/03/2021    6:54 PM  CBC  WBC 4.0 - 10.5 K/uL 6.2  11.6    Hemoglobin 12.0 - 15.0 g/dL 8.6  8.7  8.8   Hematocrit 36.0 - 46.0 % 26.9  27.9  26.0   Platelets 150 - 400 K/uL 188  216         Latest Ref Rng & Units 12/05/2021    1:46 AM 12/04/2021    2:15 AM 12/03/2021    6:54 PM  CMP  Glucose 70 - 99 mg/dL 85  76    BUN 6 - 20 mg/dL 62  70    Creatinine 0.44 - 1.00 mg/dL 3.71  4.47    Sodium 135 - 145 mmol/L 137  140   138   Potassium 3.5 - 5.1 mmol/L 5.6  4.9  5.5   Chloride 98 - 111 mmol/L 113  115    CO2 22 - 32 mmol/L 17  16    Calcium 8.9 - 10.3 mg/dL 8.5  8.3      Imaging: No results found.  Assessment/Plan:   Principal Problem:   AKI (acute kidney injury) [facility])   Patient Summary: Sabrina Williams is a 51 y.o. with a pertinent PMH of CKD 4, recurrent nephrolithiasis, DMT2, chronic pain, anxiety, depression, HTN, H LD, hepatic steatosis, and OSA who presented with hypotension and admitted for AKI now improving.   AKI on CKD 4 Symptomatic Hypotension - improving Non-anion gap Metabolic acidosis likely due to CKD Cr 2.5 at baseline.  Cr. 5.1 on admission.  Patient also noted to be hypotensive. Most likely 2/2 dehydration from 2-week history of poor PO intake.  Renal ultrasound unremarkable, CT renal 1.3 centimeter stone that was unchanged from prior and without hydronephrosis. Her kidney function is continuing to  improve daily, will encourage PO intake today and recheck labs tomorrow. MAPs staying >65. -Nephrology following, appreciate recommendations -Encourage p.o. intake -Continue to trend BMP -Continue on sodium bicarb 650 mg twice daily -holding home benazepril and HCTZ  Chronic pain s/p C6-7 spinal fusion Depression Myoclonic jerking Patient follows outpatient physical medicine for chronic pain.  Patient has been lowering her dose of gabapentin for the past 4 months as patient had developed muscle twitching that seem to be getting better with the lower dose of gabapentin.  Muscle twitching has returned and has been consistent over the past 2 weeks.  Likely lower p.o. intake, electrolyte abnormalities, in the setting of CKD 4, contributing to this acute presentation.  Concerning that there has been a buildup of by medication with the AKI. Appears to be improving with holding home CNS acting medications. -Continue to hold gabapentin and TCA antidepressant -Hold oxycodone, receiving  Dilaudid 1 mg every 6 hours -On scheduled Tylenol -Holding Cymbalta  Hyperkalemia K 5.6 this morning, likely due to AKI.  -lokelma 10g once -repeat K this afternoon  Normocytic Anemia Hemoglobin stable, no acute signs of bleeding. -F/u iron studies  DM2 Not currently on medications.  Hemoglobin A1c 5.5%.   GERD Bariatric surgery in 2017. -Continue Prilosec for reflux  Nephrolithiasis CT renal stone study with bilateral nonobstructing renal stones measuring 1.4 cm and 5 mm. -Continue to monitor  Diet: Carb/Renal VTE: Heparin IVF: None,None Code: Full   Dispo: Anticipated discharge to Home in 1 days pending clinical improvement.   Virl Axe, MD Internal Medicine Resident PGY-3 Please contact the on call pager after 5 pm and on weekends at [phone].

## 2021-12-05 NOTE — Progress Notes (Signed)
[facility] Progress Note   Subjective:   Feels globally improved this AM.  UOP good - volume hasn't been captured.  No emesis, diarrhea.  Jerking movements ~ resolved.  No dyspnea.   Had good breakfast, po intake good - IVF off as of ~11:30.   Husband bedside.  Objective Vitals:   12/04/21 2011 12/04/21 2349 12/05/21 0337 12/05/21 0902  BP: 133/74 135/81 130/73 139/89  Pulse: 67 80 80 83  Resp: 18 15 20 14   Temp: 98.2 F (36.8 C) 98.2 F (36.8 C) 98.2 F (36.8 C) 97.7 F (36.5 C)  TempSrc: Oral Oral Oral Oral  SpO2: 99% 100% (!) 88% 100%  Weight:      Height:       Physical Exam General: obese woman who appears comfortable sitting up in bed ENT: MMM Heart: RRR Lungs: clear Abdomen: soft, obese Extremities: trace hand edema, no pedal edema Neuro:  no tremor except tongue when protrudes, conversant and attentive to detail  Additional Objective Labs: Basic Metabolic Panel: Recent Labs  Lab 12/03/21 1553 12/03/21 1854 12/04/21 0215 12/05/21 0146  NA 137 138 140 137  K 5.6* 5.5* 4.9 5.6*  CL 110  --  115* 113*  CO2 17*  --  16* 17*  GLUCOSE 100*  --  76 85  BUN 79*  --  70* 62*  CREATININE 5.05*  --  4.47* 3.71*  CALCIUM 8.7*  --  8.3* 8.5*  PHOS  --   --  6.2*  --     Liver Function Tests: Recent Labs  Lab 12/03/21 1553 12/04/21 0215  AST 20  --   ALT 22  --   ALKPHOS 86  --   BILITOT 0.4  --   PROT 5.8*  --   ALBUMIN 2.9* 2.9*    No results for input(s): "LIPASE", "AMYLASE" in the last 168 hours. CBC: Recent Labs  Lab 12/03/21 1553 12/03/21 1854 12/04/21 0215 12/05/21 0146  WBC 12.1*  --  11.6* 6.2  NEUTROABS 8.1*  --   --   --   HGB 9.0* 8.8* 8.7* 8.6*  HCT 28.3* 26.0* 27.9* 26.9*  MCV 96.9  --  98.2 94.7  PLT 205  --  216 188    Blood Culture    Component Value Date/Time   SDES THROAT 11/03/2016 1123   SPECREQUEST NONE Reflexed from [id number] 11/03/2016 1123   CULT  11/03/2016 1123    NO GROUP A STREP (S.PYOGENES)  ISOLATED Performed at [facility] [address] [place], [place] [address]    REPTSTATUS 11/06/2016 FINAL 11/03/2016 1123    Cardiac Enzymes: No results for input(s): "CKTOTAL", "CKMB", "CKMBINDEX", "TROPONINI" in the last 168 hours. CBG: No results for input(s): "GLUCAP" in the last 168 hours. Iron Studies: No results for input(s): "IRON", "TIBC", "TRANSFERRIN", "FERRITIN" in the last 72 hours. @lablastinr3 @ Studies/Results: CT Renal Stone Study  Result Date: 12/03/2021 CLINICAL DATA:  Renal stones, pain EXAM: CT ABDOMEN AND PELVIS WITHOUT CONTRAST TECHNIQUE: Multidetector CT imaging of the abdomen and pelvis was performed following the standard protocol without IV contrast. RADIATION DOSE REDUCTION: This exam was performed according to the departmental dose-optimization program which includes automated exposure control, adjustment of the mA and/or kV according to patient size and/or use of iterative reconstruction technique. COMPARISON:  Renal sonogram done earlier today FINDINGS: Lower chest: Unremarkable. Hepatobiliary: No focal abnormalities are seen in the liver. There is no dilation of bile ducts. There is high density in the dependent  portion of gallbladder suggesting gallstones. There is no wall thickening. There is no fluid around the gallbladder. Pancreas: No focal abnormalities are seen. Spleen: Unremarkable. Adrenals/Urinary Tract: Adrenals are unremarkable. There is no hydronephrosis. There is a 1.4 cm calculus in the midportion of left kidney. There is a 5 mm calculus in the lower pole of right kidney. Lobulations are seen in the margins of both kidneys. Evaluation of renal cortex is limited in this noncontrast study. Ureters are not dilated. Urinary bladder is unremarkable. Stomach/Bowel: There is evidence of previous bariatric surgery with surgical staples in the stomach. Small hiatal hernia is seen. Small bowel loops are not dilated. The appendix is difficult to visualize.  In image 57 of series 5, there is small caliber tubular structure adjacent to the tip of the cecum, possibly normal appendix. There is no pericecal inflammation. Scattered diverticula are seen in colon. There is no evidence of focal acute diverticulitis. Moderate to large amount of stool is seen in colon and rectum. Vascular/Lymphatic: Scattered arterial calcifications are seen. There are subcentimeter lymph nodes in mesentery. There is minimal stranding in the mesenteric fat. There is no loculated fluid collection in the mesentery. Reproductive: Unremarkable. Other: There is no ascites or pneumoperitoneum. There is a small umbilical hernia containing fat. Musculoskeletal: Degenerative changes are noted in lumbar spine with encroachment of neural foramina, more so at L5-S1 level. IMPRESSION: There is no evidence of intestinal obstruction or pneumoperitoneum. There is no hydronephrosis. There are bilateral nonobstructing renal stones. Gallbladder stones. Few diverticula are seen in the colon without signs of focal diverticulitis. Small hiatal hernia. Other findings as described in the body of the report. Electronically Signed   By: Elmer Picker M.D.   On: 12/03/2021 19:49   US RENAL  Result Date: 12/03/2021 CLINICAL DATA:  Azotemia EXAM: RENAL / URINARY TRACT ULTRASOUND COMPLETE COMPARISON:  Renal ultrasound 08/05/2021 FINDINGS: Right Kidney: Renal measurements: 11.2 x 5.2 x 5.1 cm = volume: 153 mL. Echogenicity is increased, unchanged. No hydronephrosis. Left Kidney: Renal measurements: 10.7 x 4.2 x 5.0 cm = volume: 116 mL. Echogenicity is increased, unchanged. No hydronephrosis. There is a 1 cm shadowing calculus in the left kidney. Bladder: Appears normal for degree of bladder distention. Other: None. IMPRESSION: 1. Echogenic kidneys likely related to medical renal disease. 2. No hydronephrosis. 3. Stable left renal calculus. Electronically Signed   By: Ronney Asters M.D.   On: 12/03/2021 18:22    Medications:    acetaminophen  1,000 mg Oral TID   Or   acetaminophen  650 mg Rectal TID   calcitRIOL  0.5 mcg Oral Daily   calcium carbonate  1,250 mg Oral Daily   ferrous sulfate  325 mg Oral Q breakfast   heparin  5,000 Units Subcutaneous Q8H   pantoprazole  40 mg Oral Daily   pravastatin  10 mg Oral q1800   sodium bicarbonate  650 mg Oral BID    Assessment/Recommendations: MILIANA GANGWER is a/an 51 y.o. female with a past medical history chronic pain, staghorn calculi, dm2, chronic pain, CKD IV  who present w/ hypotension   Hypotension: Most likely related to dehydration based on the patient's history.  Improved with hydration.  Continue to hold home blood pressure medications.    Jerking: Possibly myoclonic.  I think this is less likely uremia based on the patient's history.  She has had similar jerking before when her kidney function was at her baseline.  I think what is more likely is that she had  an AKI which caused decreased clearance of her pain medications which then caused jerking.  This has improved.  -Treat AKI as below -Renal function is improving so could consider adding back her TCA and gabapentin at 100 if needed.    AKI on CKD 4: Most likely secondary to dehydration.  Follows with Dr. Holley Raring for CKD mgmt. Baseline 2.4-2.6. No hydro on RUS, UA not c/w GN -d/c IV fluids and maintain oral hydration today -Cont to hold home benazepril and hydrochlorothiazide -Continue to monitor daily Cr, Dose meds for GFR -Monitor Daily I/Os, Daily weight  -Maintain MAP>65 for optimal renal perfusion.  -Avoid nephrotoxic medications including NSAIDs -Use synthetic opioids (Fentanyl/Dilaudid) if needed -Currently no indication for HD  Possible UTI - asymptomatic but UA ? UTI -culture pending, await result   Hyperkalemia: resolved with fluids.  CTM.    Metabolic acidosis: Secondary to AKI. Bicarb 16.  Cont sodium bicarbonate 650 mg twice daily    Chronic pain: Holding some  medications as above  Anxiety:  cymbalta is on hold resume at 30 daily (assuming home dose 60 daily per chart) since it's a long term med and renal function is improving   Anemia: hgb 8s. Likely multifactorial. Iron indices ordered.  HTN: hctz and ACEi on hold given AKI/hypovolemia - BPs today are normal, continue to hold.    Plan is monitor on oral hydration today and if cont improvement tomorrow discharge home  Jannifer Hick MD 12/05/2021, 11:27 AM  [facility] Pager: [phone]

## 2021-12-06 LAB — BASIC METABOLIC PANEL
Anion gap: 8 (ref 5–15)
BUN: 56 mg/dL — ABNORMAL HIGH (ref 6–20)
CO2: 19 mmol/L — ABNORMAL LOW (ref 22–32)
Calcium: 8.5 mg/dL — ABNORMAL LOW (ref 8.9–10.3)
Chloride: 109 mmol/L (ref 98–111)
Creatinine, Ser: 3.35 mg/dL — ABNORMAL HIGH (ref 0.44–1.00)
GFR, Estimated: 16 mL/min — ABNORMAL LOW (ref >60)
Glucose, Bld: 131 mg/dL — ABNORMAL HIGH (ref 70–99)
Potassium: 5 mmol/L (ref 3.5–5.1)
Sodium: 136 mmol/L (ref 135–145)

## 2021-12-06 LAB — IRON AND TIBC
Iron: 93 ug/dL (ref 28–170)
Saturation Ratios: 41 % — ABNORMAL HIGH (ref 10.4–31.8)
TIBC: 227 ug/dL — ABNORMAL LOW (ref 250–450)
UIBC: 134 ug/dL

## 2021-12-06 LAB — CBC
HCT: 27.7 % — ABNORMAL LOW (ref 36.0–46.0)
Hemoglobin: 9.1 g/dL — ABNORMAL LOW (ref 12.0–15.0)
MCH: 30.5 pg (ref 26.0–34.0)
MCHC: 32.9 g/dL (ref 30.0–36.0)
MCV: 93 fL (ref 80.0–100.0)
Platelets: 202 10*3/uL (ref 150–400)
RBC: 2.98 MIL/uL — ABNORMAL LOW (ref 3.87–5.11)
RDW: 11.4 % — ABNORMAL LOW (ref 11.5–15.5)
WBC: 7.9 10*3/uL (ref 4.0–10.5)
nRBC: 0 % (ref 0.0–0.2)

## 2021-12-06 LAB — FERRITIN: Ferritin: 195 ng/mL (ref 11–307)

## 2021-12-06 MED ORDER — DULOXETINE HCL 30 MG PO CPEP: 30.0000 mg | ORAL_CAPSULE | Freq: Every day | ORAL | 0 refills | Status: DC

## 2021-12-06 MED ORDER — SODIUM BICARBONATE 650 MG PO TABS: 650.0000 mg | ORAL_TABLET | Freq: Two times a day (BID) | ORAL | 0 refills | Status: AC

## 2021-12-06 NOTE — Plan of Care (Signed)
  Problem: Education: Goal: Knowledge of General Education information will improve Description: Including pain rating scale, medication(s)/side effects and non-pharmacologic comfort measures Outcome: Progressing   Problem: Health Behavior/Discharge Planning: Goal: Ability to manage health-related needs will improve Outcome: Progressing   Problem: Clinical Measurements: Goal: Ability to maintain clinical measurements within normal limits will improve Outcome: Progressing Goal: Will remain free from infection Outcome: Progressing Goal: Diagnostic test results will improve Outcome: Progressing Goal: Respiratory complications will improve Outcome: Progressing Goal: Cardiovascular complication will be avoided Outcome: Progressing   Problem: Nutrition: Goal: Adequate nutrition will be maintained Outcome: Progressing   Problem: Coping: Goal: Level of anxiety will decrease Outcome: Progressing   Problem: Elimination: Goal: Will not experience complications related to bowel motility Outcome: Progressing Goal: Will not experience complications related to urinary retention Outcome: Progressing   Problem: Pain Managment: Goal: General experience of comfort will improve Outcome: Progressing   Problem: Safety: Goal: Ability to remain free from injury will improve Outcome: Progressing   Problem: Skin Integrity: Goal: Risk for impaired skin integrity will decrease Outcome: Progressing   Problem: Education: Goal: Knowledge of disease and its progression will improve Outcome: Progressing Goal: Individualized Educational Video(s) Outcome: Progressing   Problem: Fluid Volume: Goal: Compliance with measures to maintain balanced fluid volume will improve Outcome: Progressing   Problem: Health Behavior/Discharge Planning: Goal: Ability to manage health-related needs will improve Outcome: Progressing   Problem: Nutritional: Goal: Ability to make healthy dietary choices will  improve Outcome: Progressing   Problem: Clinical Measurements: Goal: Complications related to the disease process, condition or treatment will be avoided or minimized Outcome: Progressing

## 2021-12-06 NOTE — Progress Notes (Signed)
Pt ordered to discharge home. AVS reviewed with pt. Education provided as needed. Patient verbalized understanding. All questions answered.  

## 2021-12-06 NOTE — Plan of Care (Signed)
  Problem: Education: Goal: Knowledge of General Education information will improve Description: Including pain rating scale, medication(s)/side effects and non-pharmacologic comfort measures Outcome: Completed/Met   Problem: Health Behavior/Discharge Planning: Goal: Ability to manage health-related needs will improve Outcome: Completed/Met   Problem: Clinical Measurements: Goal: Ability to maintain clinical measurements within normal limits will improve Outcome: Completed/Met Goal: Will remain free from infection Outcome: Completed/Met Goal: Diagnostic test results will improve Outcome: Completed/Met Goal: Respiratory complications will improve Outcome: Completed/Met Goal: Cardiovascular complication will be avoided Outcome: Completed/Met   Problem: Activity: Goal: Risk for activity intolerance will decrease Outcome: Completed/Met   Problem: Nutrition: Goal: Adequate nutrition will be maintained Outcome: Completed/Met   Problem: Coping: Goal: Level of anxiety will decrease Outcome: Completed/Met   Problem: Elimination: Goal: Will not experience complications related to bowel motility Outcome: Completed/Met Goal: Will not experience complications related to urinary retention Outcome: Completed/Met   Problem: Pain Managment: Goal: General experience of comfort will improve Outcome: Completed/Met   Problem: Safety: Goal: Ability to remain free from injury will improve Outcome: Completed/Met   Problem: Skin Integrity: Goal: Risk for impaired skin integrity will decrease Outcome: Completed/Met   Problem: Education: Goal: Knowledge of disease and its progression will improve Outcome: Completed/Met Goal: Individualized Educational Video(s) Outcome: Completed/Met   Problem: Fluid Volume: Goal: Compliance with measures to maintain balanced fluid volume will improve Outcome: Completed/Met   Problem: Health Behavior/Discharge Planning: Goal: Ability to manage  health-related needs will improve Outcome: Completed/Met   Problem: Nutritional: Goal: Ability to make healthy dietary choices will improve Outcome: Completed/Met   Problem: Clinical Measurements: Goal: Complications related to the disease process, condition or treatment will be avoided or minimized Outcome: Completed/Met   

## 2021-12-06 NOTE — Progress Notes (Signed)
[facility] Progress Note   Subjective:   Feels globally improved this AM.  UOP good - volume hasn't been captured but sounds it's normal.  Really wants to go home today  Objective Vitals:   12/05/21 2338 12/06/21 0329 12/06/21 0500 12/06/21 0753  BP: (!) 146/90 (!) 144/85  135/84  Pulse: 77 88  86  Resp: 19 16  17   Temp: 98.2 F (36.8 C) 98.1 F (36.7 C)  98.2 F (36.8 C)  TempSrc: Oral Oral  Oral  SpO2: 99% 95%  96%  Weight:   123 kg   Height:       Physical Exam General: obese woman who appears comfortable sitting up in bed ENT: MMM Heart: RRR Lungs: clear Abdomen: soft, obese Extremities: no edema Neuro:  no tremor, conversant and attentive to detail  Additional Objective Labs: Basic Metabolic Panel: Recent Labs  Lab 12/04/21 0215 12/05/21 0146 12/05/21 1439 12/06/21 0039  NA 140 137  --  136  K 4.9 5.6* 5.2* 5.0  CL 115* 113*  --  109  CO2 16* 17*  --  19*  GLUCOSE 76 85  --  131*  BUN 70* 62*  --  56*  CREATININE 4.47* 3.71*  --  3.35*  CALCIUM 8.3* 8.5*  --  8.5*  PHOS 6.2*  --   --   --     Liver Function Tests: Recent Labs  Lab 12/03/21 1553 12/04/21 0215  AST 20  --   ALT 22  --   ALKPHOS 86  --   BILITOT 0.4  --   PROT 5.8*  --   ALBUMIN 2.9* 2.9*    No results for input(s): "LIPASE", "AMYLASE" in the last 168 hours. CBC: Recent Labs  Lab 12/03/21 1553 12/03/21 1854 12/04/21 0215 12/05/21 0146 12/06/21 0039  WBC 12.1*  --  11.6* 6.2 7.9  NEUTROABS 8.1*  --   --   --   --   HGB 9.0*   < > 8.7* 8.6* 9.1*  HCT 28.3*   < > 27.9* 26.9* 27.7*  MCV 96.9  --  98.2 94.7 93.0  PLT 205  --  216 188 202   < > = values in this interval not displayed.    Blood Culture    Component Value Date/Time   SDES URINE, CLEAN CATCH 12/04/2021 0605   SPECREQUEST NONE 12/04/2021 0605   CULT (A) 12/04/2021 0605    >=100,000 COLONIES/mL GRAM NEGATIVE RODS SUSCEPTIBILITIES TO FOLLOW Performed at [facility]  [address] [place], [place] [address]    REPTSTATUS PENDING 12/04/2021 5643    Cardiac Enzymes: No results for input(s): "CKTOTAL", "CKMB", "CKMBINDEX", "TROPONINI" in the last 168 hours. CBG: No results for input(s): "GLUCAP" in the last 168 hours. Iron Studies:  Recent Labs    12/06/21 0039  IRON 93  TIBC 227*  FERRITIN 195   @lablastinr3 @ Studies/Results: No results found. Medications:    acetaminophen  1,000 mg Oral TID   Or   acetaminophen  650 mg Rectal TID   calcitRIOL  0.5 mcg Oral Daily   calcium carbonate  1,250 mg Oral Daily   DULoxetine  30 mg Oral Daily   ferrous sulfate  325 mg Oral Q breakfast   heparin  5,000 Units Subcutaneous Q8H   pantoprazole  40 mg Oral Daily   pravastatin  10 mg Oral q1800   sodium bicarbonate  650 mg Oral BID    Assessment/Recommendations: Sabrina Williams is  a/an 51 y.o. female with a past medical history chronic pain, staghorn calculi, dm2, chronic pain, CKD IV  who present w/ hypotension   Hypotension: Most likely related to dehydration based on the patient's history.  Improved with volume.  Continue to hold home blood pressure medications.    Jerking: Possibly myoclonic.  I think this is less likely uremia based on the patient's history.  She has had similar jerking before when her kidney function was at her baseline.  I think what is more likely is that she had an AKI which caused decreased clearance of her pain medications which then caused jerking.  This has improved.    AKI on CKD 4: Most likely secondary to dehydration.  Follows with Dr. Holley Raring for CKD mgmt. Baseline 2.4-2.6. No hydro on RUS, UA not c/w GN. Continued improvement noted today.  OK for d/c.  Cont to hold ACEi until f/u. Resume HCTZ - po intake ok now and looks euvolemic.  F/u nephrologist 2 weeks - asked she call and make appt.   Possible UTI - asymptomatic, culture pending; as asymptomatic no treatment indicated   Hyperkalemia: resolved with fluids but given GFR  still not at baseline would hold ACEi on d/c to present hyperK.    Metabolic acidosis: Secondary to AKI. Bicarb 19 - ok to hold na bicarb at d/c as I expect this will self resolve as AKI cont to resolve.  Nephro will recheck in 2 weeks and can resume PRN.    Chronic pain: resume home meds at d/c  Anxiety:  cont cymbalta 30 daily (50% dose reduction for AKI) for now   Anemia: hgb 8s. Likely multifactorial. Iron indices show not iron deficient - can be followed outpt and if not improving ESA added  HTN: hctz and ACEi on hold given AKI/hypovolemia - BPs today are up to 140s - resume HCTZ at d/c, hold ACEi until f/u labs   D/c today  Jannifer Hick MD 12/06/2021, 9:15 AM  [facility] Pager: [phone]

## 2021-12-06 NOTE — Discharge Instructions (Addendum)
  Dear Sabrina Williams   You came to the hospital because you felt weak and could not eat. You were admitted because your blood pressure was low and your kidney function was lower than your usual. We do not know exactly was caused your appetite to decrease or your low oral intake, but suspect that you had a virus that precipitated the symptoms you came with.  While you were here, we administered fluid through your veins and were seen by the nephrologist service at our hospital, who recommended to change some of your medications to allow your kidney function to recover. You are able to eat and drink, your kidney function has improved, and you are now stable and are being discharged from the hospital.  We also found that your potassium was high during your hospital stay. Please be careful with your intake of potassium containing foods, including but limiting to tomatoes, potatoes, orange juice.   Please reach out to your nephrologist, Dr. Holley Raring, (kidney doctor) to follow up on your kidney laboratory studies within the next two weeks. It would be important for you to follow with your PCP provider as well to follow up with your other medical conditions.   MEDICATIONS CHANGES  NEW Sodium bicarbonate 650 mg - take this medication twice a day  STOP TAKING Benazepril - until you're re evaluated by your primary care provider or your kidney doctor. Please let your doctors  know if your blood pressure is above normal for you.    CHANGE HOW YOU TAKE Cymbalta - take 30 mg   It is okay for you to continue your microzide (HCTZ), gabapentin, oxycodone, and tizanidine when you go home.   Take care,  [facility] team

## 2021-12-06 NOTE — Discharge Summary (Signed)
Name: Sabrina Williams MRN: [id number] DOB: 1971/02/08 51 y.o. PCP: Latanya Maudlin, NP  Date of Admission: 12/03/2021  3:36 PM Date of Discharge: 12/06/2021 6:51 PM Attending Physician: Dr. Dareen Piano  Discharge Diagnosis: Principal Problem:   AKI (acute kidney injury) [facility])    Discharge Medications: Allergies as of 12/06/2021       Reactions   Penicillins Hives, Rash        Medication List     STOP taking these medications    benazepril 20 MG tablet Commonly known as: LOTENSIN   predniSONE 20 MG tablet Commonly known as: DELTASONE       TAKE these medications    ascorbic acid 500 MG tablet Commonly known as: VITAMIN C Take by mouth.   Bariatric Multivitamins/Iron Caps Take 3 capsules by mouth daily before supper.   calcitRIOL 0.5 MCG capsule Commonly known as: ROCALTROL Take 0.5 mcg by mouth daily.   calcium carbonate 1250 MG capsule Take 1,250 mg by mouth daily.   Devrom 200 MG Chew Generic drug: Bismuth Subgallate Chew 1 tablet by mouth daily.   DULoxetine 30 MG capsule Commonly known as: CYMBALTA Take 1 capsule (30 mg total) by mouth daily. Start taking on: December 07, 2021 What changed:  medication strength how much to take   ferrous sulfate 325 (65 FE) MG tablet Take 325 mg by mouth daily with breakfast.   Fingerstix Lancets Misc Use 1 each 2 (two) times daily As directed [Test blood sugars in rotating pattern:  fasting, before meals, 2 hours after a meal, at bedtime]   gabapentin 100 MG capsule Commonly known as: Neurontin Take three capsules twice a day: Weaning her Gabapentin due to CKD. What changed:  how much to take how to take this when to take this Another medication with the same name was removed. Continue taking this medication, and follow the directions you see here.   glucose blood test strip USE 1 STRIP TWICE A DAY AS DIRECTED   hydrochlorothiazide 12.5 MG capsule Commonly known as: MICROZIDE Take 1 capsule by mouth  daily.   lovastatin 20 MG tablet Commonly known as: MEVACOR Take 20 mg by mouth every evening.   magnesium oxide 400 (240 Mg) MG tablet Commonly known as: MAG-OX Take 1 tablet by mouth daily.   nortriptyline 75 MG capsule Commonly known as: PAMELOR TAKE 1 CAPSULE BY MOUTH AT BEDTIME.   omeprazole 10 MG capsule Commonly known as: PRILOSEC Take 20 mg by mouth daily.   ondansetron 4 MG tablet Commonly known as: Zofran Take 1 tablet (4 mg total) by mouth 2 (two) times daily as needed for nausea or vomiting.   Oxycodone HCl 10 MG Tabs Take 1 tablet (10 mg total) by mouth 4 (four) times daily as needed. Do Not fill Before 11/18/2021 What changed: when to take this   sodium bicarbonate 650 MG tablet Take 1 tablet (650 mg total) by mouth 2 (two) times daily.   tiZANidine 4 MG tablet Commonly known as: ZANAFLEX Take 1 tablet (4 mg total) by mouth 5 (five) times daily as needed for muscle spasms. What changed: when to take this   VITAMIN D3 PO Take 2,000 Units by mouth daily.        Disposition and follow-up:   Ms.Stefan W Lisanti was discharged from [facility] in Stable condition.  At the hospital follow up visit please address:  1.  Follow-up:  AKI on CKD4 Hyperkalemia Metabolic acidosis -Encourage PO intake -Monitor BMP and  electrolytes and urine output - Monitor for signs and symptoms of worsening  ?UTI Asymptomatic bacteriuria  - Follow cultures upon discharge - No treated while inpatient as cultures were pending  Chronic pain s/p C6-7 spinal fusion Depression Myoclonic jerking  -Monitor for signs or symptoms of side effects  Normocytic anemia -Follow iron studies, repeat cbc -Consider starting patient on Iron supplementation pending iron studies -No acute bleeding currently  2.  Labs / imaging needed at time of follow-up: cbc, bmp,   3.  Pending labs/ test needing follow-up: U/A culture pending, Iron studies  4.  Medication  Changes  STOPPED  - ACE inhibitor  -Prednisone      MODIFIED  - Cymbalta - take 30mg  daily    Follow-up Appointments:  Follow-up Information     Latanya Maudlin, NP Follow up.   Specialty: Family Medicine Contact information: [place] [id number] [place], [place], [place]. Schedule an appointment as soon as possible for a visit in 1 week(s).   Specialty: Nephrology Why: Call to make an appointment with your kidney doctor for hospital follow up and monitoring of your kidney function. Contact information: [place] [id number] [address] Hospital Course by problem list:  Sabrina Williams is a 51 y.o. with a pertinent PMH of CKD 4, recurrent nephrolithiasis, DMT2, chronic pain, anxiety, depression, HTN, H LD, hepatic steatosis, and OSA who presented with hypotension and admitted for AKI, now improved, stable for discharge  AKI on CKD4 Metabolic acidosis Hyperkalemia Patient presented with AKI likely in the setting of dehydration. Baseline Cr. 2.4 -2.6. No hydronephrosis or signs of obstruction on renal U/S. No signs of UTI on U/A. Cr. Stable today at 3.35. Patient also found to be hyperkalemic and with metabolic acidosis. Nephrology consulted during hospitalization. Continue to hold ACEi and restart HCTZ per nephrology. Patient instructed to follow with Dr. Holley Raring in 2 weeks. Recommend following BMP for Cr improvement and follow electrolyte disturbances.    Hypotension Patient presented with hypotension at admission likely in the setting of dehydration. Improved with volume resuscitation and increased PO intake. Patient instructed to hold ACEi at this time and consider restarting at nephrology f/u appointment. SBP 140s today. Patient continued on HCTZ at discharge.  Chronic pain Anxiety Patient with complex history of chronic pain. Patient home medications modified upon admission given AKI  on CKD4, now improving. Patient instructed to take Cymbalta at half dose (30mg , daily), and resume gabapentin, oxycodine, tizanidine at home doses per pain clinic.  Asymptomatic bacteriuria Patient with leukocytes, bacteria >50, negative nitrites, and asymptomatic during admission. Not treated while inpatient but recommend following urine cultures at PCP follow up.   Muscle spasms Unclear etiology. Patient reports these episodes stopped once admitted at the hospital. Suspect multifactorial including in the setting of uremia given AKI on CKD4, toxic metabolite accumulation from chronic pain medications, hyperkalemia, and dehydration. Improved.  Anemia Patient with normocytic anemia. Hgb 9.1 today. Follow iron studies on follow up and consider iron supplementation/further work up as outpatient.   Discharge Subjective:  Tolerating PO, no nausea, vomiting. Wants to go home. Last BM yesterday. Agrees to follow with Dr. Holley Raring in [facility] for nephrology follow up.  Discharge Exam:   Blood pressure 135/84, pulse 86, temperature 98.2 F (36.8 C), temperature source Oral, resp. rate 17, height 5\' 7"  (  1.702 m), weight 123 kg, last menstrual period 12/02/2019, SpO2 96 %.  Constitutional:well-appearing woman sitting in bed, in no acute distress HENT: normocephalic atraumatic, mucous membranes moist Cardiovascular: regular rate and rhythm, no m/r/g, no JVD Pulmonary/Chest: normal work of breathing on room air, lungs clear to auscultation bilaterally. No crackles  Abdominal: soft, non-tender, non-distended. No fluid wave. Neurological: alert & oriented x 3. No jerking noticed on exam MSK: no gross abnormalities. No pitting edema Skin: warm and dry Psych: Normal mood and affect  Pertinent Labs, Studies, and Procedures:     Latest Ref Rng & Units 12/06/2021   12:39 AM 12/05/2021    1:46 AM 12/04/2021    2:15 AM  CBC  WBC 4.0 - 10.5 K/uL 7.9  6.2  11.6   Hemoglobin 12.0 - 15.0 g/dL 9.1  8.6  8.7    Hematocrit 36.0 - 46.0 % 27.7  26.9  27.9   Platelets 150 - 400 K/uL 202  188  216        Latest Ref Rng & Units 12/06/2021   12:39 AM 12/05/2021    2:39 PM 12/05/2021    1:46 AM  CMP  Glucose 70 - 99 mg/dL 131   85   BUN 6 - 20 mg/dL 56   62   Creatinine 0.44 - 1.00 mg/dL 3.35   3.71   Sodium 135 - 145 mmol/L 136   137   Potassium 3.5 - 5.1 mmol/L 5.0  5.2  5.6   Chloride 98 - 111 mmol/L 109   113   CO2 22 - 32 mmol/L 19   17   Calcium 8.9 - 10.3 mg/dL 8.5   8.5     CT Renal Stone Study  Result Date: 12/03/2021 CLINICAL DATA:  Renal stones, pain EXAM: CT ABDOMEN AND PELVIS WITHOUT CONTRAST TECHNIQUE: Multidetector CT imaging of the abdomen and pelvis was performed following the standard protocol without IV contrast. RADIATION DOSE REDUCTION: This exam was performed according to the departmental dose-optimization program which includes automated exposure control, adjustment of the mA and/or kV according to patient size and/or use of iterative reconstruction technique. COMPARISON:  Renal sonogram done earlier today FINDINGS: Lower chest: Unremarkable. Hepatobiliary: No focal abnormalities are seen in the liver. There is no dilation of bile ducts. There is high density in the dependent portion of gallbladder suggesting gallstones. There is no wall thickening. There is no fluid around the gallbladder. Pancreas: No focal abnormalities are seen. Spleen: Unremarkable. Adrenals/Urinary Tract: Adrenals are unremarkable. There is no hydronephrosis. There is a 1.4 cm calculus in the midportion of left kidney. There is a 5 mm calculus in the lower pole of right kidney. Lobulations are seen in the margins of both kidneys. Evaluation of renal cortex is limited in this noncontrast study. Ureters are not dilated. Urinary bladder is unremarkable. Stomach/Bowel: There is evidence of previous bariatric surgery with surgical staples in the stomach. Small hiatal hernia is seen. Small bowel loops are not dilated.  The appendix is difficult to visualize. In image 57 of series 5, there is small caliber tubular structure adjacent to the tip of the cecum, possibly normal appendix. There is no pericecal inflammation. Scattered diverticula are seen in colon. There is no evidence of focal acute diverticulitis. Moderate to large amount of stool is seen in colon and rectum. Vascular/Lymphatic: Scattered arterial calcifications are seen. There are subcentimeter lymph nodes in mesentery. There is minimal stranding in the mesenteric fat. There is no loculated fluid collection in the mesentery.  Reproductive: Unremarkable. Other: There is no ascites or pneumoperitoneum. There is a small umbilical hernia containing fat. Musculoskeletal: Degenerative changes are noted in lumbar spine with encroachment of neural foramina, more so at L5-S1 level. IMPRESSION: There is no evidence of intestinal obstruction or pneumoperitoneum. There is no hydronephrosis. There are bilateral nonobstructing renal stones. Gallbladder stones. Few diverticula are seen in the colon without signs of focal diverticulitis. Small hiatal hernia. Other findings as described in the body of the report. Electronically Signed   By: Elmer Picker M.D.   On: 12/03/2021 19:49   US RENAL  Result Date: 12/03/2021 CLINICAL DATA:  Azotemia EXAM: RENAL / URINARY TRACT ULTRASOUND COMPLETE COMPARISON:  Renal ultrasound 08/05/2021 FINDINGS: Right Kidney: Renal measurements: 11.2 x 5.2 x 5.1 cm = volume: 153 mL. Echogenicity is increased, unchanged. No hydronephrosis. Left Kidney: Renal measurements: 10.7 x 4.2 x 5.0 cm = volume: 116 mL. Echogenicity is increased, unchanged. No hydronephrosis. There is a 1 cm shadowing calculus in the left kidney. Bladder: Appears normal for degree of bladder distention. Other: None. IMPRESSION: 1. Echogenic kidneys likely related to medical renal disease. 2. No hydronephrosis. 3. Stable left renal calculus. Electronically Signed   By: Ronney Asters M.D.   On: 12/03/2021 18:22     Discharge Instructions: Discharge Instructions     Diet - low sodium heart healthy   Complete by: As directed    Increase activity slowly   Complete by: As directed        Signed: Romana Juniper, MD [facility] - PGY1 Pager: [phone] 12/06/2021, 6:51 PM

## 2021-12-07 ENCOUNTER — Other Ambulatory Visit: Payer: Self-pay | Admitting: Registered Nurse

## 2021-12-07 MED ORDER — OXYCODONE HCL 10 MG PO TABS: 10.0000 mg | ORAL_TABLET | Freq: Four times a day (QID) | ORAL | 0 refills | Status: DC | PRN

## 2021-12-09 ENCOUNTER — Telehealth: Payer: Self-pay | Admitting: Student

## 2021-12-09 LAB — URINE CULTURE: Culture: >=100000 — AB

## 2021-12-09 NOTE — Telephone Encounter (Signed)
Phone interaction with [name] on 12/09/21 at 5:53 PM to communicate Urine culture results that showed E coli >100K  growing. Patient was asymptomatic during her recent hospitalization (12/03/2021) and currently denies pain with urination, urgency, suprapubic pain, or changes in urine patterns. Patient was notified when patients are not symptomatic, therapy is not usually required. Patient was also instructed that if she developed any urinary symptoms, fever, or new pain to notify her primary care provider.   Sabrina Juniper, MD [phone] 5:59 PM

## 2021-12-21 NOTE — Progress Notes (Signed)
Yes, Dr. Saverio Danker. I had a telephone conversation with Ms. Dubas and documented it in her chart. Thank you!

## 2021-12-29 ENCOUNTER — Other Ambulatory Visit: Payer: Self-pay | Admitting: Student

## 2022-01-01 ENCOUNTER — Other Ambulatory Visit: Payer: Self-pay | Admitting: Registered Nurse

## 2022-01-19 ENCOUNTER — Encounter: Payer: 59 | Attending: Physical Medicine & Rehabilitation | Admitting: Registered Nurse

## 2022-01-19 ENCOUNTER — Encounter: Payer: Self-pay | Admitting: Registered Nurse

## 2022-01-19 VITALS — BP 109/77 | HR 85 | Ht 67.0 in | Wt 257.0 lb

## 2022-01-19 DIAGNOSIS — M1711 Unilateral primary osteoarthritis, right knee: Secondary | ICD-10-CM | POA: Diagnosis present

## 2022-01-19 DIAGNOSIS — G8929 Other chronic pain: Secondary | ICD-10-CM

## 2022-01-19 DIAGNOSIS — M7712 Lateral epicondylitis, left elbow: Secondary | ICD-10-CM | POA: Diagnosis present

## 2022-01-19 DIAGNOSIS — M542 Cervicalgia: Secondary | ICD-10-CM | POA: Diagnosis present

## 2022-01-19 DIAGNOSIS — G894 Chronic pain syndrome: Secondary | ICD-10-CM

## 2022-01-19 DIAGNOSIS — Z5181 Encounter for therapeutic drug level monitoring: Secondary | ICD-10-CM

## 2022-01-19 DIAGNOSIS — Z79891 Long term (current) use of opiate analgesic: Secondary | ICD-10-CM

## 2022-01-19 DIAGNOSIS — M79671 Pain in right foot: Secondary | ICD-10-CM | POA: Diagnosis present

## 2022-01-19 DIAGNOSIS — G90521 Complex regional pain syndrome I of right lower limb: Secondary | ICD-10-CM | POA: Diagnosis present

## 2022-01-19 DIAGNOSIS — M5412 Radiculopathy, cervical region: Secondary | ICD-10-CM

## 2022-01-19 MED ORDER — TIZANIDINE HCL 4 MG PO TABS
4.0000 mg | ORAL_TABLET | Freq: Every day | ORAL | 5 refills | Status: DC | PRN
Start: 2022-01-19 — End: 2022-08-10

## 2022-01-19 MED ORDER — GABAPENTIN 100 MG PO CAPS
100.0000 mg | ORAL_CAPSULE | Freq: Every day | ORAL | 1 refills | Status: DC
Start: 2022-01-19 — End: 2022-02-15

## 2022-01-19 MED ORDER — OXYCODONE HCL 10 MG PO TABS
10.0000 mg | ORAL_TABLET | Freq: Four times a day (QID) | ORAL | 0 refills | Status: DC | PRN
Start: 2022-01-19 — End: 2022-02-15

## 2022-01-19 NOTE — Progress Notes (Signed)
Subjective:    Patient ID: Sabrina Williams, female    DOB: 1970/07/01, 51 y.o.   MRN: [id number]  HPI: Sabrina Williams is a 51 y.o. female who returns for follow up appointment for chronic pain and medication refill. She states her pain is located in her neck radiating into her left shoulder, left arm into her left index and middle finger with tingling and burning. She also reports right elbow pain She denies chest pain or SOB. She rates her pain 2. Her current exercise regime is walking and performing stretching exercises.  Ms. Tuch was admitted at [facility] on 12/03/2021- 12/06/2021, she was admitted with AKI, discharge summary was reviewed.   Ms. Cass Morphine equivalent is 60.00 MME.   UDS ordered today.      Pain Inventory Average Pain 3 Pain Right Now 2 My pain is constant, sharp, burning, dull, stabbing, tingling, and aching  In the last 24 hours, has pain interfered with the following? General activity 7 Relation with others 5 Enjoyment of life 5 What TIME of day is your pain at its worst? evening Sleep (in general) Fair  Pain is worse with: walking, standing, and some activites Pain improves with: rest, heat/ice, therapy/exercise, and medication Relief from Meds: 7  Family History  Problem Relation Age of Onset   Cancer Mother        multiple myeloma   Diabetes Father    Kidney disease Father    Heart disease Father    Social History   Socioeconomic History   Marital status: Married    Spouse name: Not on file   Number of children: 2   Years of education: some college   Highest education level: Not on file  Occupational History   Occupation: homemaker  Tobacco Use   Smoking status: Never   Smokeless tobacco: Never  Vaping Use   Vaping Use: Never used  Substance and Sexual Activity   Alcohol use: No    Alcohol/week: 0.0 standard drinks of alcohol   Drug use: No   Sexual activity: Not on file  Other Topics Concern   Not on file  Social  History Narrative   Lives at home with husband.   Right-handed.   No daily caffeine use.   Social Determinants of Health   Financial Resource Strain: Not on file  Food Insecurity: Not on file  Transportation Needs: Not on file  Physical Activity: Not on file  Stress: Not on file  Social Connections: Not on file   Past Surgical History:  Procedure Laterality Date   CERVICAL SPINE SURGERY     C6-7 fusion   CESAREAN SECTION     x2   dental implant  July 2015   ENDOSCOPIC PLANTAR FASCIOTOMY Right August 2015   GASTRIC BYPASS     HAND SURGERY Right    LITHOTRIPSY     multiple   PLANTAR FASCIECTOMY Right    SHOULDER SURGERY Left 10/2004   SMALL INTESTINE SURGERY     cervical laminectomy   utereroscopy  1995   Past Surgical History:  Procedure Laterality Date   CERVICAL SPINE SURGERY     C6-7 fusion   CESAREAN SECTION     x2   dental implant  July 2015   ENDOSCOPIC PLANTAR FASCIOTOMY Right August 2015   GASTRIC BYPASS     HAND SURGERY Right    LITHOTRIPSY     multiple   PLANTAR FASCIECTOMY Right    SHOULDER SURGERY Left 10/2004  SMALL INTESTINE SURGERY     cervical laminectomy   utereroscopy  1995   Past Medical History:  Diagnosis Date   Anxiety    Chronic kidney disease    stones   Chronic pain    Complex regional pain syndrome I    right foot   Depression    Diabetes mellitus    Hypertension    Kidney stone    Neuromuscular disorder (HCC)    Tremor    LMP 12/02/2019   Opioid Risk Score:   Fall Risk Score:  `1  Depression screen [facility] 2/9     12/03/2021    2:34 PM 10/22/2021   11:28 AM 09/23/2021   11:53 AM 08/25/2021   11:15 AM 06/24/2021   11:27 AM 05/26/2021    1:11 PM 02/23/2021   12:09 PM  Depression screen PHQ 2/9  Decreased Interest 1 0 1 0 0 1 0  Down, Depressed, Hopeless 1 0 1 0 0 1 0  PHQ - 2 Score 2 0 2 0 0 2 0    Review of Systems  Musculoskeletal:  Positive for back pain and neck pain.       Right knee pain, left shoulder pain down  to left hand, right foot pain  All other systems reviewed and are negative.      Objective:   Physical Exam Vitals and nursing note reviewed.  Constitutional:      Appearance: Normal appearance.  Cardiovascular:     Rate and Rhythm: Normal rate and regular rhythm.     Pulses: Normal pulses.     Heart sounds: Normal heart sounds.  Pulmonary:     Effort: Pulmonary effort is normal.     Breath sounds: Normal breath sounds.  Musculoskeletal:     Cervical back: Normal range of motion and neck supple.     Comments: Normal Muscle Bulk and Muscle Testing Reveals:  Upper Extremities: Full ROM and Muscle Strength 5/5 Lower Extremities: Full ROM and Muscle Strength 5/5 Arises from Chair slowly Narrow Based  Gait     Skin:    General: Skin is warm and dry.  Neurological:     Mental Status: She is alert and oriented to person, place, and time.  Psychiatric:        Mood and Affect: Mood normal.        Behavior: Behavior normal.         Assessment & Plan:  1.Complex regional pain syndrome right lower extremity postoperative. She's s/p post plantar fascial release. She continue with sensitivity to touch. She has diffuse numbness and tingling.Gabapentin being weaned slowly . 01/19/2022. Nucynta discontinued due to Urine Clearance. Refilled: Oxycodone 10 mg 4 times a day as needed for pain #120, .We will continue the opioid monitoring program, this consists of regular clinic visits, examinations, urine drug screen, pill counts as well as use of [place] Controlled Substance Reporting system. A 12 month History has been reviewed on the [place] Controlled Substance Reporting System on 01/19/2022. 2.Myofascial pain syndrome: Continue current medication regime with Tizanidine and  ice, heat and exercise regime. 01/19/2022 3. Depression: Continue: Zoloft. PCP Following. 01/19/2022 4. Cervical Post Laminectomy: Continue to Monitor. 01/19/2022 5. Cervicalgia/Cervical  Radiculopathy/  Left shoulder, Left Arm and Left  Hand Pain with tingling,  Continue current medication regimen. Continue to monitor. 01/19/2022 6. Poly Neuropathy: Continue current medication regimen and :Continue to Monitor. 01/19/2022 7. Left Shoulder Tendonitis: Chronic Left Shoulder Pain:  Ortho Following. Continue current medication regime  and Continue  to Monitor. 01/19/2022 8. Right hand pain/ Post Surgical  Procedure: Trigger Finger Release: Dr. Jerilee Hoh Following.  No complaints Today. 01/19/2022. 9. Tremor: No complaints today. Gabapentin being weaned slowly. Continue to monitor.  08/29//2023 10. Right   Knee Pain: Orthopedics Following: Continue with HEP as Tolerated. Continue to Monitor. 08/29/ 2023 11. Chronic Bilateral  Feet Pain. Podiatry Following.Continue with HEP as tolerated. Continue current medication regimen. Continue to monitor. 01/19/2022  12. Left Greater Trochanter Bursitis: No complaints today. Continue to alternate Ice and Heat Therapy. Continue to Monitor. 01/19/2022      F/U in 1 month

## 2022-01-23 LAB — TOXASSURE SELECT,+ANTIDEPR,UR

## 2022-01-26 ENCOUNTER — Telehealth: Payer: Self-pay | Admitting: *Deleted

## 2022-01-26 NOTE — Telephone Encounter (Signed)
Urine drug screen for this encounter is consistent for prescribed medication 

## 2022-01-31 ENCOUNTER — Other Ambulatory Visit: Payer: Self-pay | Admitting: Registered Nurse

## 2022-02-04 ENCOUNTER — Ambulatory Visit: Payer: 59 | Admitting: Neurology

## 2022-02-15 ENCOUNTER — Encounter: Payer: 59 | Attending: Physical Medicine & Rehabilitation | Admitting: Registered Nurse

## 2022-02-15 ENCOUNTER — Encounter: Payer: Self-pay | Admitting: Registered Nurse

## 2022-02-15 VITALS — BP 124/84 | HR 92 | Ht 67.0 in | Wt 252.4 lb

## 2022-02-15 DIAGNOSIS — Z5181 Encounter for therapeutic drug level monitoring: Secondary | ICD-10-CM | POA: Insufficient documentation

## 2022-02-15 DIAGNOSIS — G90521 Complex regional pain syndrome I of right lower limb: Secondary | ICD-10-CM | POA: Insufficient documentation

## 2022-02-15 DIAGNOSIS — M25512 Pain in left shoulder: Secondary | ICD-10-CM | POA: Diagnosis present

## 2022-02-15 DIAGNOSIS — M1711 Unilateral primary osteoarthritis, right knee: Secondary | ICD-10-CM | POA: Diagnosis present

## 2022-02-15 DIAGNOSIS — G894 Chronic pain syndrome: Secondary | ICD-10-CM | POA: Insufficient documentation

## 2022-02-15 DIAGNOSIS — M79671 Pain in right foot: Secondary | ICD-10-CM | POA: Insufficient documentation

## 2022-02-15 DIAGNOSIS — G8929 Other chronic pain: Secondary | ICD-10-CM | POA: Diagnosis present

## 2022-02-15 DIAGNOSIS — M7711 Lateral epicondylitis, right elbow: Secondary | ICD-10-CM | POA: Diagnosis present

## 2022-02-15 DIAGNOSIS — Z79891 Long term (current) use of opiate analgesic: Secondary | ICD-10-CM

## 2022-02-15 MED ORDER — OXYCODONE HCL 10 MG PO TABS: 10.0000 mg | ORAL_TABLET | Freq: Four times a day (QID) | ORAL | 0 refills | Status: DC | PRN

## 2022-02-15 MED ORDER — GABAPENTIN 100 MG PO CAPS: 100.0000 mg | ORAL_CAPSULE | Freq: Every day | ORAL | 2 refills | Status: DC

## 2022-02-15 NOTE — Progress Notes (Unsigned)
Subjective:    Patient ID: Sabrina Williams, female    DOB: 08/18/1970, 51 y.o.   MRN: [id number]  HPI: Sabrina Williams is a 51 y.o. female who returns for follow up appointment for chronic pain and medication refill. She states her pain is located in her right elbow, left shoulder, right knee pain and right foot pain. She also reports left hand pain with tingling and burning. She rates her pain 4. Her current exercise regime is walking and performing stretching exercises.  Ms. Shrake Morphine equivalent is 60.00 MME. Last UDS was performed 01/19/2022, it was consistent.      Pain Inventory Average Pain 5 Pain Right Now 4 My pain is constant, sharp, burning, dull, stabbing, tingling, and aching  In the last 24 hours, has pain interfered with the following? General activity 8 Relation with others 7 Enjoyment of life 7 What TIME of day is your pain at its worst? evening Sleep (in general) Fair  Pain is worse with: walking and standing Pain improves with: rest and medication Relief from Meds: 7  Family History  Problem Relation Age of Onset   Cancer Mother        multiple myeloma   Diabetes Father    Kidney disease Father    Heart disease Father    Social History   Socioeconomic History   Marital status: Married    Spouse name: Not on file   Number of children: 2   Years of education: some college   Highest education level: Not on file  Occupational History   Occupation: homemaker  Tobacco Use   Smoking status: Never   Smokeless tobacco: Never  Vaping Use   Vaping Use: Never used  Substance and Sexual Activity   Alcohol use: No    Alcohol/week: 0.0 standard drinks of alcohol   Drug use: No   Sexual activity: Not on file  Other Topics Concern   Not on file  Social History Narrative   Lives at home with husband.   Right-handed.   No daily caffeine use.   Social Determinants of Health   Financial Resource Strain: Not on file  Food Insecurity: Not on file   Transportation Needs: Not on file  Physical Activity: Not on file  Stress: Not on file  Social Connections: Not on file   Past Surgical History:  Procedure Laterality Date   CERVICAL SPINE SURGERY     C6-7 fusion   CESAREAN SECTION     x2   dental implant  July 2015   ENDOSCOPIC PLANTAR FASCIOTOMY Right August 2015   GASTRIC BYPASS     HAND SURGERY Right    LITHOTRIPSY     multiple   PLANTAR FASCIECTOMY Right    SHOULDER SURGERY Left 10/2004   SMALL INTESTINE SURGERY     cervical laminectomy   utereroscopy  1995   Past Surgical History:  Procedure Laterality Date   CERVICAL SPINE SURGERY     C6-7 fusion   CESAREAN SECTION     x2   dental implant  July 2015   ENDOSCOPIC PLANTAR FASCIOTOMY Right August 2015   GASTRIC BYPASS     HAND SURGERY Right    LITHOTRIPSY     multiple   PLANTAR FASCIECTOMY Right    SHOULDER SURGERY Left 10/2004   SMALL INTESTINE SURGERY     cervical laminectomy   utereroscopy  1995   Past Medical History:  Diagnosis Date   Anxiety    Chronic kidney disease  stones   Chronic pain    Complex regional pain syndrome I    right foot   Depression    Diabetes mellitus    Hypertension    Kidney stone    Neuromuscular disorder (HCC)    Tremor    BP 124/84   Pulse 92   Ht $R'5\' 7"'vU$  (1.702 m)   Wt 252 lb 6.4 oz (114.5 kg)   LMP 12/02/2019   SpO2 98%   BMI 39.53 kg/m   Opioid Risk Score:   Fall Risk Score:  `1  Depression screen [facility] 2/9     01/19/2022    8:20 AM 12/03/2021    2:34 PM 10/22/2021   11:28 AM 09/23/2021   11:53 AM 08/25/2021   11:15 AM 06/24/2021   11:27 AM 05/26/2021    1:11 PM  Depression screen PH[facility] 2/9  Decreased Interest 0 1 0 1 0 0 1  Down, Depressed, Hopeless 0 1 0 1 0 0 1  PHQ - 2 Score 0 2 0 2 0 0 2     Review of Systems  Constitutional: Negative.   HENT: Negative.    Eyes: Negative.   Respiratory: Negative.    Cardiovascular: Negative.   Gastrointestinal: Negative.   Endocrine: Negative.   Genitourinary:  Negative.   Musculoskeletal: Negative.   Skin: Negative.   Allergic/Immunologic: Negative.   Neurological: Negative.   Hematological: Negative.   Psychiatric/Behavioral: Negative.        Objective:   Physical Exam Vitals and nursing note reviewed.  Constitutional:      Appearance: Normal appearance. She is obese.  Cardiovascular:     Rate and Rhythm: Normal rate and regular rhythm.     Pulses: Normal pulses.     Heart sounds: Normal heart sounds.  Pulmonary:     Effort: Pulmonary effort is normal.     Breath sounds: Normal breath sounds.  Musculoskeletal:     Cervical back: Normal range of motion and neck supple.     Comments: Normal Muscle Bulk and Muscle Testing Reveals:  Upper Extremities: Full ROM and Muscle Strength 5/5 Right Epicondyle Tenderness Lower Extremities: Full ROM and Muscle Strength 5/5 Right Lower Extremity Flexion: Produces Pain into her Right Patella Arises from Table Slowly Narrow Based Gait     Skin:    General: Skin is warm and dry.  Neurological:     Mental Status: She is alert and oriented to person, place, and time.  Psychiatric:        Mood and Affect: Mood normal.        Behavior: Behavior normal.         Assessment & Plan:  1.Complex regional pain syndrome right lower extremity postoperative. She's s/p post plantar fascial release. She continue with sensitivity to touch. She has diffuse numbness and tingling.Gabapentin being weaned slowly . 02/15/2022. Nucynta discontinued due to Urine Clearance. Refilled: Oxycodone 10 mg 4 times a day as needed for pain #120, .We will continue the opioid monitoring program, this consists of regular clinic visits, examinations, urine drug screen, pill counts as well as use of [place] Controlled Substance Reporting system. A 12 month History has been reviewed on the [place] Controlled Substance Reporting System on 02/15/2022. 2.Myofascial pain syndrome: Continue current medication regime with  Tizanidine and  ice, heat and exercise regime. 02/15/2022 3. Depression: Continue: Zoloft. PCP Following. 02/15/2022 4. Cervical Post Laminectomy: Continue to Monitor. 02/15/2022 5. Cervicalgia/Cervical  Radiculopathy/ Left shoulder, Left Arm and Left  Hand Pain  with tingling,  Continue current medication regimen. Continue to monitor. 02/15/2022 6. Poly Neuropathy: Continue current medication regimen and :Continue to Monitor. 02/15/2022 7. Left Shoulder Tendonitis: Chronic Left Shoulder Pain:  Ortho Following. Continue current medication regime and Continue  to Monitor. 02/15/2022 8. Right hand pain/ Post Surgical  Procedure: Trigger Finger Release: Dr. Jerilee Hoh Following.  No complaints Today. 02/15/2022. 9. Tremor: No complaints today. Gabapentin being weaned slowly. Continue to monitor.  09/25//2023 10. Right   Knee Pain: Orthopedics Following: Continue with HEP as Tolerated. Continue to Monitor. 09/25/ 2023 11. Chronic Bilateral  Feet Pain. Podiatry Following.Continue with HEP as tolerated. Continue current medication regimen. Continue to monitor. 02/15/2022  12. Left Greater Trochanter Bursitis: No complaints today. Continue to alternate Ice and Heat Therapy. Continue to Monitor. 02/15/2022  13. Right Lateral Epicondylitis: Ortho Following. Continue to Monitor.      F/U in 1 month

## 2022-02-16 MED ORDER — OXYCODONE HCL 10 MG PO TABS: 10.0000 mg | ORAL_TABLET | Freq: Four times a day (QID) | ORAL | 0 refills | Status: DC | PRN

## 2022-02-23 ENCOUNTER — Other Ambulatory Visit: Payer: Self-pay | Admitting: Registered Nurse

## 2022-02-24 NOTE — Telephone Encounter (Signed)
Placed a call to Sabrina Williams, awaiting a return call.  [facility] called and Nortriptyline 25 mg will be removed from her profile.

## 2022-02-25 ENCOUNTER — Telehealth: Payer: Self-pay

## 2022-02-25 NOTE — Telephone Encounter (Signed)
Patient called and stated she is returning a call from Danella Sensing, NP

## 2022-03-15 ENCOUNTER — Encounter: Payer: 59 | Admitting: Registered Nurse

## 2022-03-22 ENCOUNTER — Encounter: Payer: Self-pay | Admitting: Registered Nurse

## 2022-03-22 ENCOUNTER — Encounter: Payer: 59 | Attending: Physical Medicine & Rehabilitation | Admitting: Registered Nurse

## 2022-03-22 VITALS — BP 134/92 | HR 98 | Ht 67.0 in | Wt 246.0 lb

## 2022-03-22 DIAGNOSIS — M5412 Radiculopathy, cervical region: Secondary | ICD-10-CM | POA: Diagnosis not present

## 2022-03-22 DIAGNOSIS — Z79891 Long term (current) use of opiate analgesic: Secondary | ICD-10-CM | POA: Diagnosis present

## 2022-03-22 DIAGNOSIS — G894 Chronic pain syndrome: Secondary | ICD-10-CM

## 2022-03-22 DIAGNOSIS — M542 Cervicalgia: Secondary | ICD-10-CM

## 2022-03-22 DIAGNOSIS — G90521 Complex regional pain syndrome I of right lower limb: Secondary | ICD-10-CM

## 2022-03-22 DIAGNOSIS — M79671 Pain in right foot: Secondary | ICD-10-CM | POA: Diagnosis not present

## 2022-03-22 DIAGNOSIS — G8929 Other chronic pain: Secondary | ICD-10-CM

## 2022-03-22 DIAGNOSIS — M1711 Unilateral primary osteoarthritis, right knee: Secondary | ICD-10-CM | POA: Diagnosis not present

## 2022-03-22 DIAGNOSIS — Z5181 Encounter for therapeutic drug level monitoring: Secondary | ICD-10-CM | POA: Diagnosis present

## 2022-03-22 MED ORDER — OXYCODONE HCL 10 MG PO TABS: 10.0000 mg | ORAL_TABLET | Freq: Four times a day (QID) | ORAL | 0 refills | Status: DC | PRN

## 2022-03-22 MED ORDER — GABAPENTIN 100 MG PO CAPS
100.0000 mg | ORAL_CAPSULE | Freq: Every day | ORAL | 3 refills | Status: DC
Start: 2022-03-22 — End: 2022-07-13

## 2022-03-22 NOTE — Progress Notes (Unsigned)
Subjective:    Patient ID: Sabrina Williams, female    DOB: Mar 30, 1971, 51 y.o.   MRN: [id number]  HPI: Sabrina Williams is a 51 y.o. female who returns for follow up appointment for chronic pain and medication refill. states *** pain is located in  ***. rates pain ***. current exercise regime is walking and performing stretching exercises.  Sabrina Williams Morphine equivalent is *** MME.   Last UDS was Performed on 01/19/2022, it was consistent.    Pain Inventory Average Pain 4 Pain Right Now 3 My pain is constant, sharp, burning, dull, stabbing, tingling, and aching  In the last 24 hours, has pain interfered with the following? General activity 7 Relation with others 5 Enjoyment of life 7 What TIME of day is your pain at its worst? evening Sleep (in general) Fair  Pain is worse with: walking, standing, and some activites Pain improves with: rest, heat/ice, and medication Relief from Meds: 7  Family History  Problem Relation Age of Onset   Cancer Mother        multiple myeloma   Diabetes Father    Kidney disease Father    Heart disease Father    Social History   Socioeconomic History   Marital status: Married    Spouse name: Not on file   Number of children: 2   Years of education: some college   Highest education level: Not on file  Occupational History   Occupation: homemaker  Tobacco Use   Smoking status: Never   Smokeless tobacco: Never  Vaping Use   Vaping Use: Never used  Substance and Sexual Activity   Alcohol use: No    Alcohol/week: 0.0 standard drinks of alcohol   Drug use: No   Sexual activity: Not on file  Other Topics Concern   Not on file  Social History Narrative   Lives at home with husband.   Right-handed.   No daily caffeine use.   Social Determinants of Health   Financial Resource Strain: Not on file  Food Insecurity: Not on file  Transportation Needs: Not on file  Physical Activity: Not on file  Stress: Not on file  Social  Connections: Not on file   Past Surgical History:  Procedure Laterality Date   CERVICAL SPINE SURGERY     C6-7 fusion   CESAREAN SECTION     x2   dental implant  July 2015   ENDOSCOPIC PLANTAR FASCIOTOMY Right August 2015   GASTRIC BYPASS     HAND SURGERY Right    LITHOTRIPSY     multiple   PLANTAR FASCIECTOMY Right    SHOULDER SURGERY Left 10/2004   SMALL INTESTINE SURGERY     cervical laminectomy   utereroscopy  1995   Past Surgical History:  Procedure Laterality Date   CERVICAL SPINE SURGERY     C6-7 fusion   CESAREAN SECTION     x2   dental implant  July 2015   ENDOSCOPIC PLANTAR FASCIOTOMY Right August 2015   GASTRIC BYPASS     HAND SURGERY Right    LITHOTRIPSY     multiple   PLANTAR FASCIECTOMY Right    SHOULDER SURGERY Left 10/2004   SMALL INTESTINE SURGERY     cervical laminectomy   utereroscopy  1995   Past Medical History:  Diagnosis Date   Anxiety    Chronic kidney disease    stones   Chronic pain    Complex regional pain syndrome I    right  foot   Depression    Diabetes mellitus    Hypertension    Kidney stone    Neuromuscular disorder (HCC)    Tremor    BP (!) 147/103   Pulse 100   Ht _0  (1.702 m)   Wt 246 lb (111.6 kg)   LMP 12/02/2019   SpO2 96%   BMI 38.53 kg/m   Opioid Risk Score:   Fall Risk Score:  `1  Depression screen [facility] 2/9     03/22/2022    3:16 PM 01/19/2022    8:20 AM 12/03/2021    2:34 PM 10/22/2021   11:28 AM 09/23/2021   11:53 AM 08/25/2021   11:15 AM 06/24/2021   11:27 AM  Depression screen PHQ 2/9  Decreased Interest 0 0 1 0 1 0 0  Down, Depressed, Hopeless 0 0 1 0 1 0 0  PHQ - 2 Score 0 0 2 0 2 0 0      Review of Systems  Musculoskeletal:  Positive for back pain.       Left arm pain Right Knee and lower leg pain   All other systems reviewed and are negative.     Objective:   Physical Exam        Assessment & Plan:  1.Complex regional pain syndrome right lower extremity postoperative. She's s/p  post plantar fascial release. She continue with sensitivity to touch. She has diffuse numbness and tingling.Gabapentin being weaned slowly . 02/15/2022. Nucynta discontinued due to Urine Clearance. Refilled: Oxycodone 10 mg 4 times a day as needed for pain #120, .We will continue the opioid monitoring program, this consists of regular clinic visits, examinations, urine drug screen, pill counts as well as use of [place] Controlled Substance Reporting system. A 12 month History has been reviewed on the [place] Controlled Substance Reporting System on 02/15/2022. 2.Myofascial pain syndrome: Continue current medication regime with Tizanidine and  ice, heat and exercise regime. 02/15/2022 3. Depression: Continue: Zoloft. PCP Following. 02/15/2022 4. Cervical Post Laminectomy: Continue to Monitor. 02/15/2022 5. Cervicalgia/Cervical  Radiculopathy/ Left shoulder, Left Arm and Left  Hand Pain with tingling,  Continue current medication regimen. Continue to monitor. 02/15/2022 6. Poly Neuropathy: Continue current medication regimen and :Continue to Monitor. 02/15/2022 7. Left Shoulder Tendonitis: Chronic Left Shoulder Pain:  Ortho Following. Continue current medication regime and Continue  to Monitor. 02/15/2022 8. Right hand pain/ Post Surgical  Procedure: Trigger Finger Release: Dr. Jerilee Hoh Following.  No complaints Today. 02/15/2022. 9. Tremor: No complaints today. Gabapentin being weaned slowly. Continue to monitor.  09/25//2023 10. Right   Knee Pain: Orthopedics Following: Continue with HEP as Tolerated. Continue to Monitor. 09/25/ 2023 11. Chronic Bilateral  Feet Pain. Podiatry Following.Continue with HEP as tolerated. Continue current medication regimen. Continue to monitor. 02/15/2022  12. Left Greater Trochanter Bursitis: No complaints today. Continue to alternate Ice and Heat Therapy. Continue to Monitor. 02/15/2022  13. Right Lateral Epicondylitis: Ortho Following. Continue to Monitor.       F/U in 1 month

## 2022-04-19 ENCOUNTER — Encounter: Payer: 59 | Attending: Physical Medicine & Rehabilitation | Admitting: Registered Nurse

## 2022-04-19 ENCOUNTER — Encounter: Payer: Self-pay | Admitting: Registered Nurse

## 2022-04-19 VITALS — BP 135/88 | HR 74 | Ht 67.0 in | Wt 246.0 lb

## 2022-04-19 DIAGNOSIS — G90521 Complex regional pain syndrome I of right lower limb: Secondary | ICD-10-CM

## 2022-04-19 DIAGNOSIS — M542 Cervicalgia: Secondary | ICD-10-CM

## 2022-04-19 DIAGNOSIS — Z5181 Encounter for therapeutic drug level monitoring: Secondary | ICD-10-CM | POA: Diagnosis present

## 2022-04-19 DIAGNOSIS — G8929 Other chronic pain: Secondary | ICD-10-CM | POA: Diagnosis present

## 2022-04-19 DIAGNOSIS — Z79891 Long term (current) use of opiate analgesic: Secondary | ICD-10-CM

## 2022-04-19 DIAGNOSIS — G894 Chronic pain syndrome: Secondary | ICD-10-CM

## 2022-04-19 DIAGNOSIS — M79671 Pain in right foot: Secondary | ICD-10-CM

## 2022-04-19 DIAGNOSIS — M5412 Radiculopathy, cervical region: Secondary | ICD-10-CM

## 2022-04-19 DIAGNOSIS — M1711 Unilateral primary osteoarthritis, right knee: Secondary | ICD-10-CM | POA: Diagnosis present

## 2022-04-19 DIAGNOSIS — M25512 Pain in left shoulder: Secondary | ICD-10-CM

## 2022-04-19 MED ORDER — OXYCODONE HCL 10 MG PO TABS: 10.0000 mg | ORAL_TABLET | Freq: Four times a day (QID) | ORAL | 0 refills | Status: DC | PRN

## 2022-04-19 NOTE — Progress Notes (Unsigned)
Subjective:    Patient ID: Sabrina Williams, female    DOB: [birth year], 51 y.o.   MRN: [id number]  HPI: Sabrina Williams is a 51 y.o. female who returns for follow up appointment for chronic pain and medication refill. She states her pain is located in her neck radiating into left shoulder, left arm and left hand. She also reports right knee and right foot pain. She rates her pain 3. Her current exercise regime is walking and performing stretching exercises.  Sabrina Williams: Morphine equivalent is 55.00 MME.   UDS ordered today.    Pain Inventory Average Pain 4 Pain Right Now 3 My pain is constant, sharp, burning, dull, stabbing, tingling, and aching  In the last 24 hours, has pain interfered with the following? General activity 6 Relation with others 3 Enjoyment of life 5 What TIME of day is your pain at its worst? evening Sleep (in general) Fair  Pain is worse with: walking, standing, and some activites Pain improves with: rest, heat/ice, therapy/exercise, medication, and injections Relief from Meds: 7  Family History  Problem Relation Age of Onset   Cancer Mother        multiple myeloma   Diabetes Father    Kidney disease Father    Heart disease Father    Social History   Socioeconomic History   Marital status: Married    Spouse name: Not on file   Number of children: 2   Years of education: some college   Highest education level: Not on file  Occupational History   Occupation: homemaker  Tobacco Use   Smoking status: Never   Smokeless tobacco: Never  Vaping Use   Vaping Use: Never used  Substance and Sexual Activity   Alcohol use: No    Alcohol/week: 0.0 standard drinks of alcohol   Drug use: No   Sexual activity: Not on file  Other Topics Concern   Not on file  Social History Narrative   Lives at home with husband.   Right-handed.   No daily caffeine use.   Social Determinants of Health   Financial Resource Strain: Not on file  Food Insecurity: Not  on file  Transportation Needs: Not on file  Physical Activity: Not on file  Stress: Not on file  Social Connections: Not on file   Past Surgical History:  Procedure Laterality Date   CERVICAL SPINE SURGERY     C6-7 fusion   CESAREAN SECTION     x2   dental implant  July 2015   ENDOSCOPIC PLANTAR FASCIOTOMY Right August 2015   GASTRIC BYPASS     HAND SURGERY Right    LITHOTRIPSY     multiple   PLANTAR FASCIECTOMY Right    SHOULDER SURGERY Left 10/2004   SMALL INTESTINE SURGERY     cervical laminectomy   utereroscopy  1995   Past Surgical History:  Procedure Laterality Date   CERVICAL SPINE SURGERY     C6-7 fusion   CESAREAN SECTION     x2   dental implant  July 2015   ENDOSCOPIC PLANTAR FASCIOTOMY Right August 2015   GASTRIC BYPASS     HAND SURGERY Right    LITHOTRIPSY     multiple   PLANTAR FASCIECTOMY Right    SHOULDER SURGERY Left 10/2004   SMALL INTESTINE SURGERY     cervical laminectomy   utereroscopy  1995   Past Medical History:  Diagnosis Date   Anxiety    Chronic kidney disease  stones   Chronic pain    Complex regional pain syndrome I    right foot   Depression    Diabetes mellitus    Hypertension    Kidney stone    Neuromuscular disorder (HCC)    Tremor    Ht _0  (1.702 m)   Wt 246 lb (111.6 kg)   LMP 12/02/2019   BMI 38.53 kg/m   Opioid Risk Score:   Fall Risk Score:  `1  Depression screen [facility] 2/9     03/22/2022    3:16 PM 01/19/2022    8:20 AM 12/03/2021    2:34 PM 10/22/2021   11:28 AM 09/23/2021   11:53 AM 08/25/2021   11:15 AM 06/24/2021   11:27 AM  Depression screen PHQ 2/9  Decreased Interest 0 0 1 0 1 0 0  Down, Depressed, Hopeless 0 0 1 0 1 0 0  PHQ - 2 Score 0 0 2 0 2 0 0      Review of Systems  Musculoskeletal:        B/L hand pain  Left shoulder pain Left arm pain  Right knee pain Right foot pain   All other systems reviewed and are negative.     Objective:   Physical Exam Vitals and nursing note reviewed.   Constitutional:      Appearance: Normal appearance. She is obese.  Cardiovascular:     Rate and Rhythm: Normal rate and regular rhythm.     Pulses: Normal pulses.     Heart sounds: Normal heart sounds.  Pulmonary:     Effort: Pulmonary effort is normal.     Breath sounds: Normal breath sounds.  Musculoskeletal:     Cervical back: Normal range of motion and neck supple.     Comments: Normal Muscle Bulk and Muscle Testing Reveals:  Upper Extremities: Full ROM and Muscle Strength 5/5 Left AC Joint Tenderness Lower Extremities: Full ROM and Muscle Strength 5/5 Arises from Table with ease Narrow Based  Gait     Skin:    General: Skin is warm.  Neurological:     Mental Status: She is alert and oriented to person, place, and time.  Psychiatric:        Mood and Affect: Mood normal.        Behavior: Behavior normal.         Assessment & Plan:  1.Complex regional pain syndrome right lower extremity postoperative. She's s/p post plantar fascial release. She continue with sensitivity to touch. She has diffuse numbness and tingling.Gabapentin being weaned slowly . 04/19/2022. Nucynta discontinued due to Urine Clearance. Continue slow weaning. Refilled: Oxycodone 10 mg 4 times a day as needed for pain #100, .We will continue the opioid monitoring program, this consists of regular clinic visits, examinations, urine drug screen, pill counts as well as use of [place] Controlled Substance Reporting system. A 12 month History has been reviewed on the [place] Controlled Substance Reporting System on 04/19/2022. 2.Myofascial pain syndrome: Continue current medication regime with Tizanidine and  ice, heat and exercise regime. 04/19/2022 3. Depression: Continue: Zoloft. PCP Following. 1127/2023 4. Cervical Post Laminectomy: Continue to Monitor. 04/19/2022 5. Cervicalgia/Cervical  Radiculopathy/ Left shoulder, Left Arm and Left  Hand Pain with tingling,  Continue current medication  regimen. Continue to monitor. 04/19/2022 6. Poly Neuropathy: Continue current medication regimen and :Continue to Monitor. 04/19/2022 7. Left Shoulder Tendonitis: Chronic Left Shoulder Pain:  Ortho Following. Continue current medication regime and Continue  to Monitor. 04/19/2022  8. Right hand pain/ Post Surgical  Procedure: Trigger Finger Release: Dr. Jerilee Hoh Following.  No complaints Today. 04/19/2022. 9. Tremor: No complaints today. Gabapentin being weaned slowly. Continue to monitor.  11/27//2023 10. Right   Knee Pain: Orthopedics Following: Continue with HEP as Tolerated. Continue to Monitor. 11/27/ 2023 11. Chronic Bilateral  Feet Pain. Podiatry Following.Continue with HEP as tolerated. Continue current medication regimen. Continue to monitor. 04/19/2022  12. Left Greater Trochanter Bursitis: No complaints today. Continue to alternate Ice and Heat Therapy. Continue to Monitor. 04/19/2022  13. Right Lateral Epicondylitis: No complaints today. Ortho Following. Continue to Monitor.  04/19/2022    F/U in 1 month

## 2022-04-22 LAB — TOXASSURE SELECT,+ANTIDEPR,UR

## 2022-05-07 ENCOUNTER — Telehealth: Payer: Self-pay | Admitting: *Deleted

## 2022-05-07 NOTE — Telephone Encounter (Signed)
Urine drug screen for this encounter is consistent for prescribed medication 

## 2022-05-11 ENCOUNTER — Ambulatory Visit: Payer: 59 | Admitting: Registered Nurse

## 2022-05-18 ENCOUNTER — Encounter: Payer: 59 | Attending: Physical Medicine & Rehabilitation | Admitting: Registered Nurse

## 2022-05-18 ENCOUNTER — Encounter: Payer: Self-pay | Admitting: Registered Nurse

## 2022-05-18 VITALS — BP 121/82 | HR 97 | Ht 67.0 in | Wt 245.0 lb

## 2022-05-18 DIAGNOSIS — Z79891 Long term (current) use of opiate analgesic: Secondary | ICD-10-CM | POA: Diagnosis present

## 2022-05-18 DIAGNOSIS — M5412 Radiculopathy, cervical region: Secondary | ICD-10-CM | POA: Diagnosis not present

## 2022-05-18 DIAGNOSIS — Z5181 Encounter for therapeutic drug level monitoring: Secondary | ICD-10-CM | POA: Diagnosis present

## 2022-05-18 DIAGNOSIS — G8929 Other chronic pain: Secondary | ICD-10-CM | POA: Diagnosis present

## 2022-05-18 DIAGNOSIS — G894 Chronic pain syndrome: Secondary | ICD-10-CM | POA: Insufficient documentation

## 2022-05-18 DIAGNOSIS — M79671 Pain in right foot: Secondary | ICD-10-CM | POA: Diagnosis not present

## 2022-05-18 DIAGNOSIS — G90521 Complex regional pain syndrome I of right lower limb: Secondary | ICD-10-CM | POA: Insufficient documentation

## 2022-05-18 DIAGNOSIS — M1711 Unilateral primary osteoarthritis, right knee: Secondary | ICD-10-CM | POA: Diagnosis present

## 2022-05-18 DIAGNOSIS — M542 Cervicalgia: Secondary | ICD-10-CM | POA: Diagnosis present

## 2022-05-18 MED ORDER — OXYCODONE HCL 10 MG PO TABS: 10.0000 mg | ORAL_TABLET | Freq: Four times a day (QID) | ORAL | 0 refills | Status: DC | PRN

## 2022-05-18 NOTE — Progress Notes (Signed)
Subjective:    Patient ID: Sabrina Williams, female    DOB: 04-19-1971, 51 y.o.   MRN: [id number]  HPI: Sabrina Williams is a 51 y.o. female who returns for follow up appointment for chronic pain and medication refill. She states her pain is located in her neck radiating into her left shoulder , right knee and right foot. She rates her pain 3. Her current exercise regime is walking and performing stretching exercises.  Sabrina Williams Morphine equivalent is 50.00 MME.   Last UDS was Performed on 04/19/2022, it was consistent.    Pain Inventory Average Pain 4 Pain Right Now 3 My pain is constant, sharp, burning, dull, stabbing, tingling, and aching  In the last 24 hours, has pain interfered with the following? General activity 7 Relation with others 5 Enjoyment of life 5 What TIME of day is your pain at its worst? evening Sleep (in general) Fair  Pain is worse with: walking, inactivity, standing, and some activites Pain improves with: rest, heat/ice, therapy/exercise, and medication Relief from Meds: 6  Family History  Problem Relation Age of Onset   Cancer Mother        multiple myeloma   Diabetes Father    Kidney disease Father    Heart disease Father    Social History   Socioeconomic History   Marital status: Married    Spouse name: Not on file   Number of children: 2   Years of education: some college   Highest education level: Not on file  Occupational History   Occupation: homemaker  Tobacco Use   Smoking status: Never   Smokeless tobacco: Never  Vaping Use   Vaping Use: Never used  Substance and Sexual Activity   Alcohol use: No    Alcohol/week: 0.0 standard drinks of alcohol   Drug use: No   Sexual activity: Not on file  Other Topics Concern   Not on file  Social History Narrative   Lives at home with husband.   Right-handed.   No daily caffeine use.   Social Determinants of Health   Financial Resource Strain: Not on file  Food Insecurity: Not on  file  Transportation Needs: Not on file  Physical Activity: Not on file  Stress: Not on file  Social Connections: Not on file   Past Surgical History:  Procedure Laterality Date   CERVICAL SPINE SURGERY     C6-7 fusion   CESAREAN SECTION     x2   dental implant  July 2015   ENDOSCOPIC PLANTAR FASCIOTOMY Right August 2015   GASTRIC BYPASS     HAND SURGERY Right    LITHOTRIPSY     multiple   PLANTAR FASCIECTOMY Right    SHOULDER SURGERY Left 10/2004   SMALL INTESTINE SURGERY     cervical laminectomy   utereroscopy  1995   Past Surgical History:  Procedure Laterality Date   CERVICAL SPINE SURGERY     C6-7 fusion   CESAREAN SECTION     x2   dental implant  July 2015   ENDOSCOPIC PLANTAR FASCIOTOMY Right August 2015   GASTRIC BYPASS     HAND SURGERY Right    LITHOTRIPSY     multiple   PLANTAR FASCIECTOMY Right    SHOULDER SURGERY Left 10/2004   SMALL INTESTINE SURGERY     cervical laminectomy   utereroscopy  1995   Past Medical History:  Diagnosis Date   Anxiety    Chronic kidney disease  stones   Chronic pain    Complex regional pain syndrome I    right foot   Depression    Diabetes mellitus    Hypertension    Kidney stone    Neuromuscular disorder (HCC)    Tremor    LMP 12/02/2019   Opioid Risk Score:   Fall Risk Score:  `1  Depression screen [facility] 2/9     03/22/2022    3:16 PM 01/19/2022    8:20 AM 12/03/2021    2:34 PM 10/22/2021   11:28 AM 09/23/2021   11:53 AM 08/25/2021   11:15 AM 06/24/2021   11:27 AM  Depression screen [facility] 2/9  Decreased Interest 0 0 1 0 1 0 0  Down, Depressed, Hopeless 0 0 1 0 1 0 0  PHQ - 2 Score 0 0 2 0 2 0 0      Review of Systems  Musculoskeletal:  Positive for back pain and neck pain.       Pain in the right knee, both feet, left arm       Objective:   Physical Exam Vitals and nursing note reviewed.  Constitutional:      Appearance: Normal appearance. She is obese.  Cardiovascular:     Rate and Rhythm: Normal  rate and regular rhythm.     Pulses: Normal pulses.     Heart sounds: Normal heart sounds.  Pulmonary:     Effort: Pulmonary effort is normal.     Breath sounds: Normal breath sounds.  Musculoskeletal:     Cervical back: Normal range of motion and neck supple.     Comments: Normal Muscle Bulk and Muscle Testing Reveals:  Upper Extremities: Full ROM and Muscle Strength 5/5 Lower Extremities: Full ROM and Muscle Strength 5/5 Arises from Table with ease Narrow Based  Gait     Skin:    General: Skin is warm and dry.  Neurological:     Mental Status: She is alert and oriented to person, place, and time.  Psychiatric:        Mood and Affect: Mood normal.        Behavior: Behavior normal.         Assessment & Plan:  1.Complex regional pain syndrome right lower extremity postoperative. She's s/p post plantar fascial release. She continue with sensitivity to touch. She has diffuse numbness and tingling.Gabapentin being weaned slowly . 05/18/2022. Nucynta discontinued due to Urine Clearance. Continue slow weaning. Refilled: Oxycodone 10 mg 4 times a day as needed for pain #100, .We will continue the opioid monitoring program, this consists of regular clinic visits, examinations, urine drug screen, pill counts as well as use of [place] Controlled Substance Reporting system. A 12 month History has been reviewed on the [place] Controlled Substance Reporting System on 05/18/2022. 2.Myofascial pain syndrome: Continue current medication regime with Tizanidine and  ice, heat and exercise regime. 05/18/2022 3. Depression: Continue: Zoloft. PCP Following. 05/18/2022 4. Cervical Post Laminectomy: Continue to Monitor. 05/18/2022 5. Cervicalgia/Cervical  Radiculopathy/ Left shoulder, Left Arm and Left  Hand Pain with tingling,  Continue current medication regimen. Continue to monitor. 05/18/2022 6. Poly Neuropathy: Continue current medication regimen and :Continue to Monitor. 05/18/2022 7.  Left Shoulder Tendonitis: Chronic Left Shoulder Pain:  Ortho Following. Continue current medication regime and Continue  to Monitor. 05/18/2022 8. Right hand pain/ Post Surgical  Procedure: Trigger Finger Release: Dr. Jerilee Hoh Following.  No complaints Today. 05/18/2022. 9. Tremor: No complaints today. Gabapentin being weaned slowly. Continue  to monitor.  12/26//2023 10. Right   Knee Pain: Orthopedics Following: Continue with HEP as Tolerated. Continue to Monitor. 12/26/ 2023 11. Chronic Bilateral  Feet Pain. Podiatry Following.Continue with HEP as tolerated. Continue current medication regimen. Continue to monitor. 05/18/2022  12. Left Greater Trochanter Bursitis: No complaints today. Continue to alternate Ice and Heat Therapy. Continue to Monitor. 05/18/2022  13. Right Lateral Epicondylitis: No complaints today. Ortho Following. Continue to Monitor.  05/18/2022    F/U in 1 month

## 2022-06-15 ENCOUNTER — Encounter: Payer: 59 | Attending: Physical Medicine & Rehabilitation | Admitting: Registered Nurse

## 2022-06-15 VITALS — BP 121/82 | HR 80 | Ht 67.0 in | Wt 239.2 lb

## 2022-06-15 DIAGNOSIS — Z5181 Encounter for therapeutic drug level monitoring: Secondary | ICD-10-CM | POA: Diagnosis present

## 2022-06-15 DIAGNOSIS — Z79891 Long term (current) use of opiate analgesic: Secondary | ICD-10-CM | POA: Diagnosis present

## 2022-06-15 DIAGNOSIS — G90521 Complex regional pain syndrome I of right lower limb: Secondary | ICD-10-CM | POA: Diagnosis present

## 2022-06-15 DIAGNOSIS — G894 Chronic pain syndrome: Secondary | ICD-10-CM

## 2022-06-15 DIAGNOSIS — G8929 Other chronic pain: Secondary | ICD-10-CM

## 2022-06-15 DIAGNOSIS — M5412 Radiculopathy, cervical region: Secondary | ICD-10-CM

## 2022-06-15 DIAGNOSIS — M1711 Unilateral primary osteoarthritis, right knee: Secondary | ICD-10-CM | POA: Diagnosis present

## 2022-06-15 DIAGNOSIS — M546 Pain in thoracic spine: Secondary | ICD-10-CM | POA: Diagnosis present

## 2022-06-15 DIAGNOSIS — M79671 Pain in right foot: Secondary | ICD-10-CM | POA: Insufficient documentation

## 2022-06-15 DIAGNOSIS — M542 Cervicalgia: Secondary | ICD-10-CM

## 2022-06-15 MED ORDER — OXYCODONE HCL 10 MG PO TABS
10.0000 mg | ORAL_TABLET | Freq: Four times a day (QID) | ORAL | 0 refills | Status: DC | PRN
Start: 2022-06-15 — End: 2022-07-06

## 2022-06-15 NOTE — Progress Notes (Unsigned)
Subjective:    Patient ID: [name], female    DOB: May 25, 1970, 52 y.o.   MRN: [id number]  HPI: Sabrina Williams is a 52 y.o. female who returns for follow up appointment for chronic pain and medication refill. states *** pain is located in  ***. rates pain ***. current exercise regime is walking and performing stretching exercises.  Ms. Ganesh Morphine equivalent is *** MME.   Last UDS was Performed on 04/19/2022, it was consistent.     Pain Inventory Average Pain 4 Pain Right Now 3 My pain is constant, sharp, burning, dull, stabbing, tingling, and aching  In the last 24 hours, has pain interfered with the following? General activity 6 Relation with others 5 Enjoyment of life 5 What TIME of day is your pain at its worst? evening Sleep (in general) Fair  Pain is worse with: walking, standing, and some activites Pain improves with: rest, heat/ice, therapy/exercise, and medication Relief from Meds: 7  Family History  Problem Relation Age of Onset   Cancer Mother        multiple myeloma   Diabetes Father    Kidney disease Father    Heart disease Father    Social History   Socioeconomic History   Marital status: Married    Spouse name: Not on file   Number of children: 2   Years of education: some college   Highest education level: Not on file  Occupational History   Occupation: homemaker  Tobacco Use   Smoking status: Never   Smokeless tobacco: Never  Vaping Use   Vaping Use: Never used  Substance and Sexual Activity   Alcohol use: No    Alcohol/week: 0.0 standard drinks of alcohol   Drug use: No   Sexual activity: Not on file  Other Topics Concern   Not on file  Social History Narrative   Lives at home with husband.   Right-handed.   No daily caffeine use.   Social Determinants of Health   Financial Resource Strain: Not on file  Food Insecurity: Not on file  Transportation Needs: Not on file  Physical Activity: Not on file  Stress: Not on  file  Social Connections: Not on file   Past Surgical History:  Procedure Laterality Date   CERVICAL SPINE SURGERY     C6-7 fusion   CESAREAN SECTION     x2   dental implant  July 2015   ENDOSCOPIC PLANTAR FASCIOTOMY Right August 2015   GASTRIC BYPASS     HAND SURGERY Right    LITHOTRIPSY     multiple   PLANTAR FASCIECTOMY Right    SHOULDER SURGERY Left 10/2004   SMALL INTESTINE SURGERY     cervical laminectomy   utereroscopy  1995   Past Surgical History:  Procedure Laterality Date   CERVICAL SPINE SURGERY     C6-7 fusion   CESAREAN SECTION     x2   dental implant  July 2015   ENDOSCOPIC PLANTAR FASCIOTOMY Right August 2015   GASTRIC BYPASS     HAND SURGERY Right    LITHOTRIPSY     multiple   PLANTAR FASCIECTOMY Right    SHOULDER SURGERY Left 10/2004   SMALL INTESTINE SURGERY     cervical laminectomy   utereroscopy  1995   Past Medical History:  Diagnosis Date   Anxiety    Chronic kidney disease    stones   Chronic pain    Complex regional pain syndrome I  right foot   Depression    Diabetes mellitus    Hypertension    Kidney stone    Neuromuscular disorder (HCC)    Tremor    BP 121/82   Pulse 80   Ht 5\' 7"  (1.702 m)   Wt 239 lb 3.2 oz (108.5 kg)   LMP 12/02/2019   SpO2 98%   BMI 37.46 kg/m   Opioid Risk Score:   Fall Risk Score:  `1  Depression screen [facility] 2/9     05/18/2022    3:38 PM 03/22/2022    3:16 PM 01/19/2022    8:20 AM 12/03/2021    2:34 PM 10/22/2021   11:28 AM 09/23/2021   11:53 AM 08/25/2021   11:15 AM  Depression screen [facility] 2/9  Decreased Interest 0 0 0 1 0 1 0  Down, Depressed, Hopeless 0 0 0 1 0 1 0  PHQ - 2 Score 0 0 0 2 0 2 0     Review of Systems  Musculoskeletal:        Left arm pain Right knee pain Right foot pain  All other systems reviewed and are negative.     Objective:   Physical Exam        Assessment & Plan:  1.Complex regional pain syndrome right lower extremity postoperative. She's s/p post  plantar fascial release. She continue with sensitivity to touch. She has diffuse numbness and tingling.Gabapentin being weaned slowly . 05/18/2022. Nucynta discontinued due to Urine Clearance. Continue slow weaning. Refilled: Oxycodone 10 mg 4 times a day as needed for pain #100, .We will continue the opioid monitoring program, this consists of regular clinic visits, examinations, urine drug screen, pill counts as well as use of [place] Controlled Substance Reporting system. A 12 month History has been reviewed on the [place] Controlled Substance Reporting System on 05/18/2022. 2.Myofascial pain syndrome: Continue current medication regime with Tizanidine and  ice, heat and exercise regime. 05/18/2022 3. Depression: Continue: Zoloft. PCP Following. 05/18/2022 4. Cervical Post Laminectomy: Continue to Monitor. 05/18/2022 5. Cervicalgia/Cervical  Radiculopathy/ Left shoulder, Left Arm and Left  Hand Pain with tingling,  Continue current medication regimen. Continue to monitor. 05/18/2022 6. Poly Neuropathy: Continue current medication regimen and :Continue to Monitor. 05/18/2022 7. Left Shoulder Tendonitis: Chronic Left Shoulder Pain:  Ortho Following. Continue current medication regime and Continue  to Monitor. 05/18/2022 8. Right hand pain/ Post Surgical  Procedure: Trigger Finger Release: Dr. Jerilee Hoh Following.  No complaints Today. 05/18/2022. 9. Tremor: No complaints today. Gabapentin being weaned slowly. Continue to monitor.  12/26//2023 10. Right   Knee Pain: Orthopedics Following: Continue with HEP as Tolerated. Continue to Monitor. 12/26/ 2023 11. Chronic Bilateral  Feet Pain. Podiatry Following.Continue with HEP as tolerated. Continue current medication regimen. Continue to monitor. 05/18/2022  12. Left Greater Trochanter Bursitis: No complaints today. Continue to alternate Ice and Heat Therapy. Continue to Monitor. 05/18/2022  13. Right Lateral Epicondylitis: No complaints  today. Ortho Following. Continue to Monitor.  05/18/2022    F/U in 1 month

## 2022-06-17 ENCOUNTER — Other Ambulatory Visit: Payer: Self-pay | Admitting: Family Medicine

## 2022-06-17 DIAGNOSIS — N200 Calculus of kidney: Secondary | ICD-10-CM

## 2022-06-18 ENCOUNTER — Ambulatory Visit
Admission: RE | Admit: 2022-06-18 | Discharge: 2022-06-18 | Disposition: A | Discharge status: Home / Self Care | Payer: 59 | Source: Ambulatory Visit | Attending: Urology | Admitting: Urology

## 2022-06-18 ENCOUNTER — Ambulatory Visit (INDEPENDENT_AMBULATORY_CARE_PROVIDER_SITE_OTHER): Payer: 59 | Admitting: Urology

## 2022-06-18 ENCOUNTER — Encounter: Payer: Self-pay | Admitting: Urology

## 2022-06-18 VITALS — BP 108/70 | HR 103 | Ht 67.0 in | Wt 235.0 lb

## 2022-06-18 DIAGNOSIS — A4181 Sepsis due to Enterococcus: Secondary | ICD-10-CM | POA: Diagnosis not present

## 2022-06-18 DIAGNOSIS — R1032 Left lower quadrant pain: Secondary | ICD-10-CM | POA: Diagnosis not present

## 2022-06-18 DIAGNOSIS — N200 Calculus of kidney: Secondary | ICD-10-CM

## 2022-06-18 LAB — MICROSCOPIC EXAMINATION
Epithelial Cells (non renal): >10 /hpf — AB (ref 0–10)
WBC, UA: >30 /hpf — AB (ref 0–5)

## 2022-06-18 LAB — URINALYSIS, COMPLETE
Bilirubin, UA: NEGATIVE
Glucose, UA: NEGATIVE
Ketones, UA: NEGATIVE
Nitrite, UA: POSITIVE — AB
Specific Gravity, UA: 1.02 (ref 1.005–1.030)
Urobilinogen, Ur: 0.2 mg/dL (ref 0.2–1.0)
pH, UA: 5.5 (ref 5.0–7.5)

## 2022-06-18 MED ORDER — TAMSULOSIN HCL 0.4 MG PO CAPS: 0.4000 mg | ORAL_CAPSULE | Freq: Every day | ORAL | 0 refills | Status: DC

## 2022-06-18 NOTE — Progress Notes (Signed)
06/18/2022 10:00 AM   Netty Starring Clerk [birth year] [id number]  Referring provider: Latanya Maudlin, NP [address] [place],  [place] [address]  Chief Complaint  Patient presents with   Nephrolithiasis    HPI: Sabrina Williams is a 52 y.o. female who presents for evaluation for possible ureteral calculus.  Seen by me several years ago for recurrent stone disease She has not had any recent stone episodes.  A CT July 2023 showed a 5 mm right lower pole calculus and a 14 mm left mid calculus which were nonobstructing She presents with a 3-day history of what started as left flank pain which is now radiating to the left lower quadrant No fever, chills, nausea or vomiting She is followed by pain management and has been taking 3-4 oxycodone daily which is up from 1-2   PMH: Past Medical History:  Diagnosis Date   Anxiety    Chronic kidney disease    stones   Chronic pain    Complex regional pain syndrome I    right foot   Depression    Diabetes mellitus    Hypertension    Kidney stone    Neuromuscular disorder [facility])    Tremor     Surgical History: Past Surgical History:  Procedure Laterality Date   CERVICAL SPINE SURGERY     C6-7 fusion   CESAREAN SECTION     x2   dental implant  July 2015   ENDOSCOPIC PLANTAR FASCIOTOMY Right August 2015   GASTRIC BYPASS     HAND SURGERY Right    LITHOTRIPSY     multiple   PLANTAR FASCIECTOMY Right    SHOULDER SURGERY Left 10/2004   SMALL INTESTINE SURGERY     cervical laminectomy   utereroscopy  1995    Home Medications:  Allergies as of 06/18/2022       Reactions   Penicillins Hives, Rash        Medication List        Accurate as of June 18, 2022 10:00 AM. If you have any questions, ask your nurse or doctor.          Bariatric Multivitamins/Iron Caps Take 3 capsules by mouth daily before supper.   benazepril 20 MG tablet Commonly known as: LOTENSIN Take 20 mg by mouth daily.   calcitRIOL 0.5  MCG capsule Commonly known as: ROCALTROL Take 0.5 mcg by mouth daily.   calcium carbonate 1250 MG capsule Take 1,250 mg by mouth daily.   Devrom 200 MG Chew Generic drug: Bismuth Subgallate Chew 1 tablet by mouth daily.   DULoxetine 30 MG capsule Commonly known as: CYMBALTA Take by mouth.   ferrous sulfate 325 (65 FE) MG tablet Take 325 mg by mouth daily with breakfast.   Fingerstix Lancets Misc Use 1 each 2 (two) times daily As directed [Test blood sugars in rotating pattern:  fasting, before meals, 2 hours after a meal, at bedtime]   gabapentin 100 MG capsule Commonly known as: NEURONTIN Take 1 capsule (100 mg total) by mouth daily.   glucose blood test strip USE 1 STRIP TWICE A DAY AS DIRECTED   hydrochlorothiazide 12.5 MG capsule Commonly known as: MICROZIDE Take 1 capsule by mouth daily.   lovastatin 20 MG tablet Commonly known as: MEVACOR Take 20 mg by mouth every evening.   magnesium oxide 400 MG tablet Commonly known as: MAG-OX Take 1 tablet by mouth daily.   nortriptyline 75 MG capsule Commonly known as: PAMELOR Take by  mouth.   omeprazole 10 MG capsule Commonly known as: PRILOSEC Take 20 mg by mouth daily.   ondansetron 4 MG tablet Commonly known as: ZOFRAN TAKE 1 TABLET (4 MG TOTAL) BY MOUTH 2 (TWO) TIMES DAILY AS NEEDED FOR NAUSEA OR VOMITING.   Oxycodone HCl 10 MG Tabs Take 1 tablet (10 mg total) by mouth 4 (four) times daily as needed.   tamsulosin 0.4 MG Caps capsule Commonly known as: FLOMAX Take 1 capsule (0.4 mg total) by mouth daily. Started by: Abbie Sons, MD   tiZANidine 4 MG tablet Commonly known as: ZANAFLEX Take 1 tablet (4 mg total) by mouth 5 (five) times daily as needed for muscle spasms.   VITAMIN D3 PO Take 2,000 Units by mouth daily.        Allergies:  Allergies  Allergen Reactions   Penicillins Hives and Rash    Family History: Family History  Problem Relation Age of Onset   Cancer Mother         multiple myeloma   Diabetes Father    Kidney disease Father    Heart disease Father     Social History:  reports that she has never smoked. She has never used smokeless tobacco. She reports that she does not drink alcohol and does not use drugs.   Physical Exam: BP 108/70   Pulse (!) 103   Ht 5\' 7"  (1.702 m)   Wt 235 lb (106.6 kg)   LMP 12/02/2019   BMI 36.81 kg/m   Constitutional:  Alert and oriented, No acute distress. HEENT: [place] AT Respiratory: Normal respiratory effort, no increased work of breathing. Psychiatric: Normal mood and affect.  Laboratory Data:  Urinalysis    Pertinent Imaging: CT images were personally reviewed and interpreted.  A KUB performed today was personally reviewed and interpreted.  There is a large amount of stool my gas obscuring the renal outlines.  There is a left calcification laterally but may be lateral to the renal outline   CT Renal Stone Study  Narrative CLINICAL DATA:  Renal stones, pain  EXAM: CT ABDOMEN AND PELVIS WITHOUT CONTRAST  TECHNIQUE: Multidetector CT imaging of the abdomen and pelvis was performed following the standard protocol without IV contrast.  RADIATION DOSE REDUCTION: This exam was performed according to the departmental dose-optimization program which includes automated exposure control, adjustment of the mA and/or kV according to patient size and/or use of iterative reconstruction technique.  COMPARISON:  Renal sonogram done earlier today  FINDINGS: Lower chest: Unremarkable.  Hepatobiliary: No focal abnormalities are seen in the liver. There is no dilation of bile ducts. There is high density in the dependent portion of gallbladder suggesting gallstones. There is no wall thickening. There is no fluid around the gallbladder.  Pancreas: No focal abnormalities are seen.  Spleen: Unremarkable.  Adrenals/Urinary Tract: Adrenals are unremarkable. There is no hydronephrosis. There is a 1.4 cm calculus in  the midportion of left kidney. There is a 5 mm calculus in the lower pole of right kidney. Lobulations are seen in the margins of both kidneys. Evaluation of renal cortex is limited in this noncontrast study. Ureters are not dilated. Urinary bladder is unremarkable.  Stomach/Bowel: There is evidence of previous bariatric surgery with surgical staples in the stomach. Small hiatal hernia is seen. Small bowel loops are not dilated. The appendix is difficult to visualize. In image 57 of series 5, there is small caliber tubular structure adjacent to the tip of the cecum, possibly normal appendix. There is  no pericecal inflammation. Scattered diverticula are seen in colon. There is no evidence of focal acute diverticulitis. Moderate to large amount of stool is seen in colon and rectum.  Vascular/Lymphatic: Scattered arterial calcifications are seen. There are subcentimeter lymph nodes in mesentery. There is minimal stranding in the mesenteric fat. There is no loculated fluid collection in the mesentery.  Reproductive: Unremarkable.  Other: There is no ascites or pneumoperitoneum. There is a small umbilical hernia containing fat.  Musculoskeletal: Degenerative changes are noted in lumbar spine with encroachment of neural foramina, more so at L5-S1 level.  IMPRESSION: There is no evidence of intestinal obstruction or pneumoperitoneum. There is no hydronephrosis.  There are bilateral nonobstructing renal stones. Gallbladder stones. Few diverticula are seen in the colon without signs of focal diverticulitis. Small hiatal hernia.  Other findings as described in the body of the report.   Electronically Signed By: Elmer Picker M.D. On: 12/03/2021 19:49   Assessment & Plan:    1.  Nephrolithiasis CT July 2023 with bilateral, nonobstructing renal calculi Recent onset left flank pain radiating the left lower quadrant KUB today nondiagnostic Schedule renal stone protocol CT  for further evaluation Rx tamsulosin 0.4 mg sent to [facility] [place] [address], [place], [place] [address] [phone]

## 2022-06-18 NOTE — Addendum Note (Signed)
Addended by: Kyra Manges on: 06/18/2022 10:50 AM   Modules accepted: Orders

## 2022-06-21 ENCOUNTER — Inpatient Hospital Stay
Admission: EM | Admit: 2022-06-21 | Discharge: 2022-06-24 | DRG: 854 | Disposition: A | Discharge status: Home / Self Care | Payer: 59 | Attending: Internal Medicine | Admitting: Internal Medicine

## 2022-06-21 ENCOUNTER — Inpatient Hospital Stay: Payer: 59 | Admitting: Certified Registered Nurse Anesthetist

## 2022-06-21 ENCOUNTER — Other Ambulatory Visit: Payer: Self-pay

## 2022-06-21 ENCOUNTER — Encounter
Admission: EM | Disposition: A | Discharge status: Home / Self Care | Payer: Self-pay | Source: Home / Self Care | Attending: Internal Medicine

## 2022-06-21 ENCOUNTER — Emergency Department: Payer: 59

## 2022-06-21 ENCOUNTER — Inpatient Hospital Stay: Payer: 59

## 2022-06-21 ENCOUNTER — Encounter: Payer: Self-pay | Admitting: Internal Medicine

## 2022-06-21 DIAGNOSIS — K219 Gastro-esophageal reflux disease without esophagitis: Secondary | ICD-10-CM | POA: Diagnosis present

## 2022-06-21 DIAGNOSIS — A419 Sepsis, unspecified organism: Secondary | ICD-10-CM | POA: Diagnosis present

## 2022-06-21 DIAGNOSIS — G8929 Other chronic pain: Secondary | ICD-10-CM | POA: Diagnosis present

## 2022-06-21 DIAGNOSIS — N184 Chronic kidney disease, stage 4 (severe): Secondary | ICD-10-CM | POA: Diagnosis present

## 2022-06-21 DIAGNOSIS — N179 Acute kidney failure, unspecified: Secondary | ICD-10-CM | POA: Diagnosis present

## 2022-06-21 DIAGNOSIS — E871 Hypo-osmolality and hyponatremia: Secondary | ICD-10-CM | POA: Diagnosis present

## 2022-06-21 DIAGNOSIS — N289 Disorder of kidney and ureter, unspecified: Secondary | ICD-10-CM

## 2022-06-21 DIAGNOSIS — Z6841 Body Mass Index (BMI) 40.0 and over, adult: Secondary | ICD-10-CM

## 2022-06-21 DIAGNOSIS — N202 Calculus of kidney with calculus of ureter: Secondary | ICD-10-CM | POA: Diagnosis present

## 2022-06-21 DIAGNOSIS — A4181 Sepsis due to Enterococcus: Principal | ICD-10-CM | POA: Diagnosis present

## 2022-06-21 DIAGNOSIS — N201 Calculus of ureter: Secondary | ICD-10-CM

## 2022-06-21 DIAGNOSIS — G90521 Complex regional pain syndrome I of right lower limb: Secondary | ICD-10-CM | POA: Diagnosis present

## 2022-06-21 DIAGNOSIS — B9689 Other specified bacterial agents as the cause of diseases classified elsewhere: Secondary | ICD-10-CM | POA: Diagnosis present

## 2022-06-21 DIAGNOSIS — I10 Essential (primary) hypertension: Secondary | ICD-10-CM | POA: Diagnosis not present

## 2022-06-21 DIAGNOSIS — K8020 Calculus of gallbladder without cholecystitis without obstruction: Secondary | ICD-10-CM | POA: Diagnosis present

## 2022-06-21 DIAGNOSIS — Z9884 Bariatric surgery status: Secondary | ICD-10-CM | POA: Diagnosis not present

## 2022-06-21 DIAGNOSIS — Z8249 Family history of ischemic heart disease and other diseases of the circulatory system: Secondary | ICD-10-CM | POA: Diagnosis not present

## 2022-06-21 DIAGNOSIS — Z981 Arthrodesis status: Secondary | ICD-10-CM

## 2022-06-21 DIAGNOSIS — N136 Pyonephrosis: Secondary | ICD-10-CM | POA: Diagnosis present

## 2022-06-21 DIAGNOSIS — E1122 Type 2 diabetes mellitus with diabetic chronic kidney disease: Secondary | ICD-10-CM | POA: Diagnosis present

## 2022-06-21 DIAGNOSIS — N189 Chronic kidney disease, unspecified: Secondary | ICD-10-CM | POA: Diagnosis not present

## 2022-06-21 DIAGNOSIS — I959 Hypotension, unspecified: Principal | ICD-10-CM

## 2022-06-21 DIAGNOSIS — F32A Depression, unspecified: Secondary | ICD-10-CM | POA: Diagnosis present

## 2022-06-21 DIAGNOSIS — Z833 Family history of diabetes mellitus: Secondary | ICD-10-CM

## 2022-06-21 DIAGNOSIS — Z79899 Other long term (current) drug therapy: Secondary | ICD-10-CM

## 2022-06-21 DIAGNOSIS — N200 Calculus of kidney: Secondary | ICD-10-CM

## 2022-06-21 DIAGNOSIS — B9620 Unspecified Escherichia coli [E. coli] as the cause of diseases classified elsewhere: Secondary | ICD-10-CM | POA: Diagnosis not present

## 2022-06-21 DIAGNOSIS — F419 Anxiety disorder, unspecified: Secondary | ICD-10-CM | POA: Diagnosis present

## 2022-06-21 DIAGNOSIS — I129 Hypertensive chronic kidney disease with stage 1 through stage 4 chronic kidney disease, or unspecified chronic kidney disease: Secondary | ICD-10-CM | POA: Diagnosis present

## 2022-06-21 DIAGNOSIS — E66813 Obesity, class 3: Secondary | ICD-10-CM

## 2022-06-21 DIAGNOSIS — R6520 Severe sepsis without septic shock: Secondary | ICD-10-CM | POA: Diagnosis present

## 2022-06-21 DIAGNOSIS — Z807 Family history of other malignant neoplasms of lymphoid, hematopoietic and related tissues: Secondary | ICD-10-CM

## 2022-06-21 DIAGNOSIS — Z841 Family history of disorders of kidney and ureter: Secondary | ICD-10-CM

## 2022-06-21 DIAGNOSIS — Z87442 Personal history of urinary calculi: Secondary | ICD-10-CM

## 2022-06-21 DIAGNOSIS — N1832 Chronic kidney disease, stage 3b: Secondary | ICD-10-CM | POA: Diagnosis present

## 2022-06-21 DIAGNOSIS — Z880 Allergy status to penicillin: Secondary | ICD-10-CM

## 2022-06-21 DIAGNOSIS — N390 Urinary tract infection, site not specified: Secondary | ICD-10-CM | POA: Diagnosis not present

## 2022-06-21 DIAGNOSIS — Z1152 Encounter for screening for COVID-19: Secondary | ICD-10-CM

## 2022-06-21 HISTORY — PX: CYSTOSCOPY WITH RETROGRADE PYELOGRAM, URETEROSCOPY AND STENT PLACEMENT: SHX5789

## 2022-06-21 LAB — COMPREHENSIVE METABOLIC PANEL
ALT: 18 U/L (ref 0–44)
AST: 20 U/L (ref 15–41)
Albumin: 2.3 g/dL — ABNORMAL LOW (ref 3.5–5.0)
Alkaline Phosphatase: 120 U/L (ref 38–126)
Anion gap: 8 (ref 5–15)
BUN: 63 mg/dL — ABNORMAL HIGH (ref 6–20)
CO2: 20 mmol/L — ABNORMAL LOW (ref 22–32)
Calcium: 8.4 mg/dL — ABNORMAL LOW (ref 8.9–10.3)
Chloride: 98 mmol/L (ref 98–111)
Creatinine, Ser: 4.07 mg/dL — ABNORMAL HIGH (ref 0.44–1.00)
GFR, Estimated: 13 mL/min — ABNORMAL LOW (ref >60)
Glucose, Bld: 135 mg/dL — ABNORMAL HIGH (ref 70–99)
Potassium: 4.9 mmol/L (ref 3.5–5.1)
Sodium: 126 mmol/L — ABNORMAL LOW (ref 135–145)
Total Bilirubin: 0.7 mg/dL (ref 0.3–1.2)
Total Protein: 5.5 g/dL — ABNORMAL LOW (ref 6.5–8.1)

## 2022-06-21 LAB — MRSA NEXT GEN BY PCR, NASAL: MRSA by PCR Next Gen: NOT DETECTED

## 2022-06-21 LAB — CBC
HCT: 29.9 % — ABNORMAL LOW (ref 36.0–46.0)
Hemoglobin: 9.5 g/dL — ABNORMAL LOW (ref 12.0–15.0)
MCH: 30.6 pg (ref 26.0–34.0)
MCHC: 31.8 g/dL (ref 30.0–36.0)
MCV: 96.5 fL (ref 80.0–100.0)
Platelets: 219 10*3/uL (ref 150–400)
RBC: 3.1 MIL/uL — ABNORMAL LOW (ref 3.87–5.11)
RDW: 12.3 % (ref 11.5–15.5)
WBC: 16.1 10*3/uL — ABNORMAL HIGH (ref 4.0–10.5)
nRBC: 0 % (ref 0.0–0.2)

## 2022-06-21 LAB — CBG MONITORING, ED
Glucose-Capillary: 92 mg/dL (ref 70–99)
Glucose-Capillary: 99 mg/dL (ref 70–99)

## 2022-06-21 LAB — TROPONIN I (HIGH SENSITIVITY)
Troponin I (High Sensitivity): 21 ng/L — ABNORMAL HIGH (ref <18)
Troponin I (High Sensitivity): 7 ng/L (ref <18)

## 2022-06-21 LAB — RESP PANEL BY RT-PCR (RSV, FLU A&B, COVID)  RVPGX2
Influenza A by PCR: NEGATIVE
Influenza B by PCR: NEGATIVE
Resp Syncytial Virus by PCR: NEGATIVE
SARS Coronavirus 2 by RT PCR: NEGATIVE

## 2022-06-21 LAB — LACTIC ACID, PLASMA
Lactic Acid, Venous: 0.6 mmol/L (ref 0.5–1.9)
Lactic Acid, Venous: 1.3 mmol/L (ref 0.5–1.9)

## 2022-06-21 SURGERY — CYSTOURETEROSCOPY, WITH RETROGRADE PYELOGRAM AND STENT INSERTION
Anesthesia: General | Laterality: Left

## 2022-06-21 MED ORDER — ACETAMINOPHEN 325 MG PO TABS
650.0000 mg | ORAL_TABLET | Freq: Four times a day (QID) | ORAL | Status: DC | PRN
  Administered 2022-06-21 – 2022-06-24 (×8): 650 mg via ORAL
  Filled 2022-06-21 (×8): qty 2

## 2022-06-21 MED ORDER — SODIUM CHLORIDE 0.9 % IV SOLN
2.0000 g | Freq: Two times a day (BID) | INTRAVENOUS | Status: DC
  Administered 2022-06-21: 2 g via INTRAVENOUS

## 2022-06-21 MED ORDER — LACTATED RINGERS IV SOLN: INTRAVENOUS | Status: DC | PRN

## 2022-06-21 MED ORDER — NORTRIPTYLINE HCL 25 MG PO CAPS
75.0000 mg | ORAL_CAPSULE | Freq: Every day | ORAL | Status: DC
  Administered 2022-06-21 – 2022-06-23 (×3): 75 mg via ORAL
  Filled 2022-06-21 (×3): qty 3

## 2022-06-21 MED ORDER — SODIUM CHLORIDE 0.9 % IV SOLN: 2.0000 g | INTRAVENOUS | Status: DC

## 2022-06-21 MED ORDER — SODIUM CHLORIDE 0.9 % IV BOLUS
1000.0000 mL | Freq: Once | INTRAVENOUS | Status: AC
  Administered 2022-06-21: 1000 mL via INTRAVENOUS

## 2022-06-21 MED ORDER — VANCOMYCIN HCL IN DEXTROSE 1-5 GM/200ML-% IV SOLN: 1000.0000 mg | Freq: Once | INTRAVENOUS | Status: DC

## 2022-06-21 MED ORDER — SODIUM CHLORIDE 0.9 % IV SOLN: 250.0000 mL | INTRAVENOUS | Status: DC

## 2022-06-21 MED ORDER — SODIUM CHLORIDE 0.9 % IV SOLN: INTRAVENOUS | Status: DC

## 2022-06-21 MED ORDER — LACTATED RINGERS IV BOLUS
1000.0000 mL | Freq: Once | INTRAVENOUS | Status: AC
  Administered 2022-06-21: 1000 mL via INTRAVENOUS

## 2022-06-21 MED ORDER — PANTOPRAZOLE SODIUM 20 MG PO TBEC
20.0000 mg | DELAYED_RELEASE_TABLET | Freq: Every day | ORAL | Status: DC
  Administered 2022-06-21 – 2022-06-24 (×4): 20 mg via ORAL
  Filled 2022-06-21 (×5): qty 1

## 2022-06-21 MED ORDER — MIDAZOLAM HCL 2 MG/2ML IJ SOLN
INTRAMUSCULAR | Status: DC | PRN
  Administered 2022-06-21 (×2): 1 mg via INTRAVENOUS

## 2022-06-21 MED ORDER — IOHEXOL 180 MG/ML  SOLN
INTRAMUSCULAR | Status: DC | PRN
  Administered 2022-06-21: 10 mL

## 2022-06-21 MED ORDER — SODIUM CHLORIDE 0.9 % IR SOLN
Status: DC | PRN
  Administered 2022-06-21: 3000 mL

## 2022-06-21 MED ORDER — FENTANYL CITRATE (PF) 100 MCG/2ML IJ SOLN: 25.0000 ug | INTRAMUSCULAR | Status: DC | PRN

## 2022-06-21 MED ORDER — FENTANYL CITRATE (PF) 100 MCG/2ML IJ SOLN
INTRAMUSCULAR | Status: DC | PRN
  Administered 2022-06-21 (×5): 25 ug via INTRAVENOUS

## 2022-06-21 MED ORDER — VANCOMYCIN HCL 2000 MG/400ML IV SOLN
2000.0000 mg | Freq: Once | INTRAVENOUS | Status: AC
  Administered 2022-06-21: 2000 mg via INTRAVENOUS
  Filled 2022-06-21 (×2): qty 400

## 2022-06-21 MED ORDER — CHLORHEXIDINE GLUCONATE CLOTH 2 % EX PADS
6.0000 | MEDICATED_PAD | Freq: Every day | CUTANEOUS | Status: DC
  Administered 2022-06-21 – 2022-06-23 (×3): 6 via TOPICAL

## 2022-06-21 MED ORDER — LACTATED RINGERS IV BOLUS
500.0000 mL | Freq: Once | INTRAVENOUS | Status: AC
  Administered 2022-06-21: 500 mL via INTRAVENOUS

## 2022-06-21 MED ORDER — SODIUM CHLORIDE 0.9 % IV SOLN
2.0000 g | Freq: Once | INTRAVENOUS | Status: AC
  Administered 2022-06-21: 2 g via INTRAVENOUS
  Filled 2022-06-21: qty 12.5

## 2022-06-21 MED ORDER — NOREPINEPHRINE 4 MG/250ML-% IV SOLN: 0.0000 ug/min | INTRAVENOUS | Status: DC

## 2022-06-21 MED ORDER — DULOXETINE HCL 30 MG PO CPEP
30.0000 mg | ORAL_CAPSULE | Freq: Every day | ORAL | Status: DC
  Administered 2022-06-21 – 2022-06-23 (×3): 30 mg via ORAL
  Filled 2022-06-21 (×3): qty 1

## 2022-06-21 MED ORDER — ONDANSETRON HCL 4 MG/2ML IJ SOLN: 4.0000 mg | Freq: Four times a day (QID) | INTRAMUSCULAR | Status: DC | PRN

## 2022-06-21 MED ORDER — METRONIDAZOLE 500 MG/100ML IV SOLN
500.0000 mg | Freq: Once | INTRAVENOUS | Status: AC
  Administered 2022-06-21: 500 mg via INTRAVENOUS
  Filled 2022-06-21: qty 100

## 2022-06-21 MED ORDER — LIDOCAINE HCL (CARDIAC) PF 100 MG/5ML IV SOSY
PREFILLED_SYRINGE | INTRAVENOUS | Status: DC | PRN
  Administered 2022-06-21: 50 mg via INTRAVENOUS

## 2022-06-21 MED ORDER — PRAVASTATIN SODIUM 20 MG PO TABS
20.0000 mg | ORAL_TABLET | Freq: Every day | ORAL | Status: DC
  Administered 2022-06-22 – 2022-06-23 (×2): 20 mg via ORAL
  Filled 2022-06-21 (×2): qty 1

## 2022-06-21 MED ORDER — NOREPINEPHRINE 4 MG/250ML-% IV SOLN
INTRAVENOUS | Status: AC
  Administered 2022-06-21: 4 mg
  Administered 2022-06-21: 4 ug/min via INTRAVENOUS
  Administered 2022-06-21: 2 ug/min via INTRAVENOUS
  Filled 2022-06-21: qty 250

## 2022-06-21 MED ORDER — ONDANSETRON HCL 4 MG/2ML IJ SOLN: 4.0000 mg | Freq: Once | INTRAMUSCULAR | Status: DC | PRN

## 2022-06-21 MED ORDER — ADULT MULTIVITAMIN W/MINERALS CH
1.0000 | ORAL_TABLET | Freq: Every day | ORAL | Status: DC
  Administered 2022-06-22 – 2022-06-24 (×3): 1 via ORAL
  Filled 2022-06-21 (×3): qty 1

## 2022-06-21 MED ORDER — NOREPINEPHRINE 4 MG/250ML-% IV SOLN
2.0000 ug/min | INTRAVENOUS | Status: DC
  Administered 2022-06-21: 2 ug/min via INTRAVENOUS
  Filled 2022-06-21: qty 250

## 2022-06-21 MED ORDER — MORPHINE SULFATE (PF) 2 MG/ML IV SOLN
1.0000 mg | INTRAVENOUS | Status: DC | PRN
  Administered 2022-06-21: 1 mg via INTRAVENOUS
  Filled 2022-06-21: qty 1

## 2022-06-21 SURGICAL SUPPLY — 9 items
BAG DRAIN CYSTO-URO LG1000N (MISCELLANEOUS) IMPLANT
CATH URETL OPEN 5X70 (CATHETERS) IMPLANT
GUIDEWIRE STR DUAL SENSOR (WIRE) IMPLANT
IV NS IRRIG 3000ML ARTHROMATIC (IV SOLUTION) IMPLANT
KIT TURNOVER CYSTO (KITS) IMPLANT
PACK CYSTO (CUSTOM PROCEDURE TRAY) IMPLANT
SET CYSTO W/LG BORE CLAMP LF (SET/KITS/TRAYS/PACK) IMPLANT
STENT URET 6FRX24 CONTOUR (STENTS) IMPLANT
SURGILUBE 2OZ TUBE FLIPTOP (MISCELLANEOUS) IMPLANT

## 2022-06-21 NOTE — Transfer of Care (Signed)
Immediate Anesthesia Transfer of Care Note  Patient: Sabrina Williams  Procedure(s) Performed: CYSTOSCOPY WITH RETROGRADE PYELOGRAM, URETEROSCOPY AND STENT PLACEMENT (Left)  Patient Location: PACU  Anesthesia Type:MAC  Level of Consciousness: awake, alert , oriented, and patient cooperative  Airway & Oxygen Therapy: Patient Spontanous Breathing and Patient connected to face mask oxygen  Post-op Assessment: Report given to RN and Post -op Vital signs reviewed and stable  Post vital signs: Reviewed and stable  Last Vitals:  Vitals Value Taken Time  BP 120/63 06/21/22 1730  Temp    Pulse 103 06/21/22 1734  Resp 22 06/21/22 1734  SpO2 100 % 06/21/22 1734  Vitals shown include unvalidated device data.  Last Pain:  Vitals:   06/21/22 1646  TempSrc:   PainSc: 8          Complications: No notable events documented.

## 2022-06-21 NOTE — Progress Notes (Signed)
An USGPIV (ultrasound guided PIV) has been placed for short-term vasopressor infusion. A correctly placed ivWatch must be used when administering Vasopressors. Should this treatment be needed beyond 72 hours, central line access should be obtained.  It will be the responsibility of the bedside nurse to follow best practice to prevent extravasations.   ?

## 2022-06-21 NOTE — Progress Notes (Signed)
CODE SEPSIS - PHARMACY COMMUNICATION  **Broad Spectrum Antibiotics should be administered within 1 hour of Sepsis diagnosis**  Time Code Sepsis Called/Page Received: 1511  Antibiotics Ordered: cefepime, metronidazole, vancomycin  Time of 1st antibiotic administration: 1625  Additional action taken by pharmacy: N/A    Alison Murray ,PharmD Clinical Pharmacist  06/21/2022  5:12 PM

## 2022-06-21 NOTE — H&P (Signed)
History and Physical    Patient: Sabrina Williams ZOX:[id number] DOB: 1970/12/29 DOA: 06/21/2022 DOS: the patient was seen and examined on 06/21/2022 PCP: Latanya Maudlin, NP  Patient coming from: Home  Chief Complaint:  Chief Complaint  Patient presents with   Hypotension   HPI: Sabrina Williams is a 52 y.o. female who presents with hypotension for the past few days. Family at the bedside report the pt kept passing out and falling asleep in front of her food. BP at home was 65/45. Pt also report L sided flank pain with these symptoms. Pain is constant. It is sharp and aggravated with movement. No alleviating factors. Pain is rated 8/10 currently. She also went to her urologist on 06/18/2022 and had a workup. UA from 06/18/2022 showed positive nitrite and 2+ leukocytes. Pt has received 2L NS bolus from the ER physician along with IV antibx's. Urology has been consulted. She will be admitted for further management.    Review of Systems: As mentioned in the history of present illness. All other systems reviewed and are negative. Past Medical History:  Diagnosis Date   Anxiety    Chronic kidney disease    stones   Chronic pain    Complex regional pain syndrome I    right foot   Depression    Diabetes mellitus    Hypertension    Kidney stone    Neuromuscular disorder [facility])    Tremor    Past Surgical History:  Procedure Laterality Date   CERVICAL SPINE SURGERY     C6-7 fusion   CESAREAN SECTION     x2   dental implant  July 2015   ENDOSCOPIC PLANTAR FASCIOTOMY Right August 2015   GASTRIC BYPASS     HAND SURGERY Right    LITHOTRIPSY     multiple   PLANTAR FASCIECTOMY Right    SHOULDER SURGERY Left 10/2004   SMALL INTESTINE SURGERY     cervical laminectomy   utereroscopy  1995   Social History:  reports that she has never smoked. She has never used smokeless tobacco. She reports that she does not drink alcohol and does not use drugs.  Allergies  Allergen Reactions    Penicillins Hives and Rash    Family History  Problem Relation Age of Onset   Cancer Mother        multiple myeloma   Diabetes Father    Kidney disease Father    Heart disease Father     Prior to Admission medications   Medication Sig Start Date End Date Taking? Authorizing Provider  benazepril (LOTENSIN) 20 MG tablet Take 20 mg by mouth daily. 12/06/21   [provider]  Bismuth Subgallate (DEVROM) 200 MG CHEW Chew 1 tablet by mouth daily.    [provider]  calcitRIOL (ROCALTROL) 0.5 MCG capsule Take 0.5 mcg by mouth daily. 02/02/20   [provider]  calcium carbonate 1250 MG capsule Take 1,250 mg by mouth daily.    [provider]  Cholecalciferol (VITAMIN D3 PO) Take 2,000 Units by mouth daily.    [provider]  DULoxetine (CYMBALTA) 30 MG capsule Take by mouth.    [provider]  ferrous sulfate 325 (65 FE) MG tablet Take 325 mg by mouth daily with breakfast. 04/07/21   [provider]  Fingerstix Lancets MISC Use 1 each 2 (two) times daily As directed [Test blood sugars in rotating pattern:  fasting, before meals, 2 hours after a meal, at bedtime]  [provider]  gabapentin (NEURONTIN) 100 MG capsule Take 1 capsule (100 mg total) by mouth daily. 03/22/22   Danella Sensing L, NP  glucose blood test strip USE 1 STRIP TWICE A DAY AS DIRECTED 02/17/15   [provider]  hydrochlorothiazide (MICROZIDE) 12.5 MG capsule Take 1 capsule by mouth daily. 09/10/21   [provider]  lovastatin (MEVACOR) 20 MG tablet Take 20 mg by mouth every evening. 07/18/20   [provider]  magnesium oxide (MAG-OX) 400 MG tablet Take 1 tablet by mouth daily. 12/28/21   [provider]  Multiple Vitamins-Minerals (BARIATRIC MULTIVITAMINS/IRON) CAPS Take 3 capsules by mouth daily before supper.    [provider]  nortriptyline (PAMELOR) 75 MG capsule Take by mouth. 09/01/21   [provider]  omeprazole (PRILOSEC) 10 MG capsule Take 20 mg by mouth daily.    [provider]  ondansetron (ZOFRAN) 4 MG tablet TAKE 1 TABLET (4 MG TOTAL) BY MOUTH 2 (TWO) TIMES DAILY AS NEEDED FOR NAUSEA OR VOMITING. 02/24/22   Bayard Hugger, NP  Oxycodone HCl 10 MG TABS Take 1 tablet (10 mg total) by mouth 4 (four) times daily as needed. 06/15/22   Bayard Hugger, NP  tamsulosin (FLOMAX) 0.4 MG CAPS capsule Take 1 capsule (0.4 mg total) by mouth daily. 06/18/22   Stoioff, Ronda Fairly, MD  tiZANidine (ZANAFLEX) 4 MG tablet Take 1 tablet (4 mg total) by mouth 5 (five) times daily as needed for muscle spasms. 01/19/22   Bayard Hugger, NP    Physical Exam: Vitals:   06/21/22 1410 06/21/22 1420 06/21/22 1430 06/21/22 1500  BP: (!) 65/39 (!) 65/29 (!) 102/57 (!) 115/50  Pulse: 71 72 72 89  Resp: 14 18    Temp:      TempSrc:      SpO2: 99% 97% 99% 99%  Weight:       Physical Exam Constitutional:      Appearance: Normal appearance.  HENT:     Head: Normocephalic and atraumatic.     Mouth/Throat:     Mouth: Mucous membranes are dry.  Cardiovascular:     Rate and Rhythm: Normal rate and regular rhythm.  Pulmonary:     Effort: Pulmonary effort is normal.     Breath sounds: Normal breath sounds.  Abdominal:     Palpations: Abdomen is soft.     Comments: Obese. L sided CVA tenderness   Musculoskeletal:        General: Normal range of motion.     Cervical back: Neck supple.  Skin:    General: Skin is dry.  Neurological:     Mental Status: She is alert. Mental status is at baseline.  Psychiatric:        Mood and Affect: Mood normal.      Data Reviewed:  Results are pending, will review when available.  Assessment and Plan: Sepsis in the setting of L sided kidney stone - IV NS bolus in the ER administered with IV antibx - IV NS 150 cc/hr  - IV cefepime 2 g q12  - NPO  - Urology consulted from the ER   UTI - IV fluids and IV antibx as above   AKI on CKD -  IV antibx as above  - NPO for surgical intervention by urology   Acute HypoNa+ - IV fluids as above   DVT prophylaxis: SCDs as pt is going to the OR with urological intervention    Advance Care  Planning:   Code Status: Prior FULL code  Consults: Urology Dr. Erlene Quan   Family Communication: Family at bedside aware of the plan   Severity of Illness: The appropriate patient status for this patient is INPATIENT. Inpatient status is judged to be reasonable and necessary in order to provide the required intensity of service to ensure the patient's safety. The patient's presenting symptoms, physical exam findings, and initial radiographic and laboratory data in the context of their chronic comorbidities is felt to place them at high risk for further clinical deterioration. Furthermore, it is not anticipated that the patient will be medically stable for discharge from the hospital within 2 midnights of admission.   * I certify that at the point of admission it is my clinical judgment that the patient will require inpatient hospital care spanning beyond 2 midnights from the point of admission due to high intensity of service, high risk for further deterioration and high frequency of surveillance required.*  Author: Lucienne Minks , MD 06/21/2022 4:17 PM  For on call review [email or web address].

## 2022-06-21 NOTE — ED Provider Notes (Signed)
[facility] Provider Note    Event Date/Time   First MD Initiated Contact with Patient 06/21/22 1340     (approximate)   History   Hypotension   HPI  Sabrina Williams is a 52 y.o. female with history of complex regional pain syndrome, hypertension currently on 10 mg of lisinopril who comes in with concerns for low blood pressures.  Patient reports she has intermittent issues of high and low blood pressures.  She reports being admitted back in July with worsening kidney function and with concerns for low blood pressures.  She reports that she was briefly taken off of all of her blood pressure medications but a few months ago was restarted back onto her lisinopril at a half dose.  However the last few days she has had increasing weakness and near syncopal episodes and noted her blood pressure to be in the 50s.  She has not been taking the blood pressure medicine due to noticing these changes.  She does report having a fall and hitting her head a few days ago.  She does report some episodes of confusion since then.  Denies any new neck pain.  When I reviewed patient's note from urology her blood pressure was 108/70.   Physical Exam   Triage Vital Signs: ED Triage Vitals  Enc Vitals Group     BP 06/21/22 1330 (!) 70/47     Pulse Rate 06/21/22 1330 84     Resp 06/21/22 1330 18     Temp 06/21/22 1330 97.6 F (36.4 C)     Temp Source 06/21/22 1330 Oral     SpO2 06/21/22 1330 98 %     Weight 06/21/22 1331 242 lb (109.8 kg)     Height --      Head Circumference --      Peak Flow --      Pain Score 06/21/22 1331 0     Pain Loc --      Pain Edu? --      Excl. in [place]? --     Most recent vital signs: Vitals:   06/21/22 1330  BP: (!) 70/47  Pulse: 84  Resp: 18  Temp: 97.6 F (36.4 C)  SpO2: 98%     General: Awake, no distress.  CV:  Good peripheral perfusion.  Resp:  Normal effort.  Abd:  No distention.  Other:  Equal strength in arms and legs.   Abdomen soft and nontender.  No C-spine tenderness   ED Results / Procedures / Treatments   Labs (all labs ordered are listed, but only abnormal results are displayed) Labs Reviewed  COMPREHENSIVE METABOLIC PANEL - Abnormal; Notable for the following components:      Result Value   Sodium 126 (*)    CO2 20 (*)    Glucose, Bld 135 (*)    BUN 63 (*)    Creatinine, Ser 4.07 (*)    Calcium 8.4 (*)    Total Protein 5.5 (*)    Albumin 2.3 (*)    GFR, Estimated 13 (*)    All other components within normal limits  CBC - Abnormal; Notable for the following components:   WBC 16.1 (*)    RBC 3.10 (*)    Hemoglobin 9.5 (*)    HCT 29.9 (*)    All other components within normal limits  TROPONIN I (HIGH SENSITIVITY) - Abnormal; Notable for the following components:   Troponin I (High Sensitivity) 21 (*)  All other components within normal limits  RESP PANEL BY RT-PCR (RSV, FLU A&B, COVID)  [id number]     EKG  My interpretation of EKG:  Normal sinus rhythm 76 without any ST elevation or T wave inversions, short PR interval  RADIOLOGY I have reviewed the CT had personally interpreted and no evidence of intracranial hemorrhage   PROCEDURES:  Critical Care performed: Yes, see critical care procedure note(s)  .1-3 Lead EKG Interpretation  Performed by: Vanessa Chipley, MD Authorized by: Vanessa Chicora, MD     Interpretation: normal     ECG rate:  80   ECG rate assessment: normal     Rhythm: sinus rhythm     Ectopy: none     Conduction: normal   .Critical Care  Performed by: Vanessa Platter, MD Authorized by: Vanessa [place], MD   Critical care provider statement:    Critical care time (minutes):  30   Critical care was necessary to treat or prevent imminent or life-threatening deterioration of the following conditions:  Shock   Critical care was time spent personally by me on the following activities:  Development of treatment plan with patient or surrogate, discussions with  consultants, evaluation of patient's response to treatment, examination of patient, ordering and review of laboratory studies, ordering and review of radiographic studies, ordering and performing treatments and interventions, pulse oximetry, re-evaluation of patient's condition and review of old [place] ED: Medications  norepinephrine (LEVOPHED) 4mg  in 265mL (0.016 mg/mL) premix infusion (3 mcg/min Intravenous Rate/Dose Change 06/21/22 1458)  ceFEPIme (MAXIPIME) 2 g in sodium chloride 0.9 % 100 mL IVPB (has no administration in time range)  metroNIDAZOLE (FLAGYL) IVPB 500 mg (has no administration in time range)  vancomycin (VANCOCIN) IVPB 1000 mg/200 mL premix (has no administration in time range)  lactated ringers bolus 500 mL (has no administration in time range)  lactated ringers bolus 1,000 mL (has no administration in time range)  sodium chloride 0.9 % bolus 1,000 mL (1,000 mLs Intravenous New Bag/Given 06/21/22 1340)  sodium chloride 0.9 % bolus 1,000 mL (1,000 mLs Intravenous New Bag/Given 06/21/22 1424)     IMPRESSION / MDM / [place] / ED COURSE  I reviewed the triage vital signs and the nursing notes.   Patient's presentation is most consistent with acute presentation with potential threat to life or bodily function.   Suspect most likely prerenal dehydration and AKI causing hypotension.  While fluids were going patient started to get more hypotensive and was feeling more lightheaded therefore patient was placed on Levophed peripherally up to 10 mcg.  As the fluids were going and we were able to down titrate this.  COVID, flu are negative.  Labs show creatinine of 4.07 with a sodium of 126.  White count elevated at 16.  Hemoglobin stable at 9.  Troponin slightly elevated at 21.  I reviewed her labs and on 06/02/2022 her creatinine had gotten down to 2.45  Discussed with family about a CT renal and they do report history of kidney stones we will  make sure there is nothing obstructive that is causing worsening kidney function and CT head given the fall and hitting her head to rule out any intracranial hemorrhage.  Patient will require admission to the hospital if able to be weaned off the Levophed versus admission to ICU if continues on Levophed.   White count is elevated and given concern for low blood pressures I did call  a sepsis alert just to be on the safe side and covered broadly with antibiotics but she denies any other obvious infectious symptoms.  The patient is on the cardiac monitor to evaluate for evidence of arrhythmia and/or significant heart rate changes.      FINAL CLINICAL IMPRESSION(S) / ED DIAGNOSES   Final diagnoses:  Hypotension, unspecified hypotension type  AKI (acute kidney injury) ([place])  Hyponatremia     Rx / DC Orders   ED Discharge Orders     None        Note:  This document was prepared using Dragon voice recognition software and may include unintentional dictation errors.   Vanessa [place], MD 06/21/22 [phone]

## 2022-06-21 NOTE — Op Note (Signed)
Date of procedure: 06/21/22  Preoperative diagnosis:  Left obstructing UPJ stone Sepsis of probable urinary source  Postoperative diagnosis:  As above  Procedure: Left retrograde pyelogram Left ureteral stent placement  Surgeon: Hollice Espy, MD  Anesthesia: MAC  Complications: None  Intraoperative findings: Purulent material with wire manipulation, dark cloudy urine return upon stent placement.  EBL: Minimal  Specimens: None  Drains: 6 x 2 4 French double-J ureteral stent on left  Indication: Sabrina Williams is a 52 y.o. patient with extensive personal history of kidney stones presenting with hypotension, leukocytosis and concern for sepsis of urinary source with an obstructing 14 mm left UPJ stone.  After reviewing the management options for treatment, she elected to proceed with the above surgical procedure(s). We have discussed the potential benefits and risks of the procedure, side effects of the proposed treatment, the likelihood of the patient achieving the goals of the procedure, and any potential problems that might occur during the procedure or recuperation. Informed consent has been obtained.  Description of procedure:  The patient was taken to the operating room and MAC was provided in the setting of the patient's hypotension and concern for worsening of this.  The patient was placed in the dorsal lithotomy position, prepped and draped in the usual sterile fashion, and preoperative antibiotics were administered. A preoperative time-out was performed.   A 21 French scope was advanced per urethra into the bladder.  Notably, the bladder was diffusely erythematous with cloudy urine consistent with cystitis.  Attention was turned to the left UO.  On scout imaging, the stone in question could be seen.  A wire was then advanced and upon wire manipulation, toothpaste purulent material was seen effluxing out of the ureter.  I ended up getting the wire up to the level of the  stone and secondary to tortuosity, had some difficulty getting around the stone.  Ended up advancing an open-ended ureteral catheter up to the level of the proximal ureter with this maneuver, was able to straighten the ureter enough such that the wire was able to pass around the stone.  In order to confirm appropriate position, I did go ahead and advance the 5 [place] open-ended ureteral catheter up into the renal pelvis and inject some additional contrast for retrograde pyelogram on the side.  This showed dilated calyces and confirm the position within the renal pelvis.  They replaced the sensor wire and remove the open-ended catheter.  A 6 x 24 French double-J ureteral stent was then advanced over the wire up to the level of the kidney.  Upon wire withdrawal, there was a full coil noted within the renal pelvis as well as within the bladder.  The bladder was then drained.  The patient was then cleaned and dried, repositioned in the supine position, reversed of anesthesia, and taken to the PACU in stable condition.  Plan: Continue supportive care, agree with inpatient admission.  Will plan for definitive stone treatment either ureteroscopy or ESWL in the next several weeks with Dr. Bernardo Heater for definitive management of her stone.  Hollice Espy, M.D.

## 2022-06-21 NOTE — Consult Note (Signed)
Urology Consult  I have been asked to see the patient by Dr. Jari Pigg, for evaluation and management of left flank pain.  Chief Complaint: left flank pain  History of Present Illness: Sabrina Williams is a 52 y.o. year old female with a known personal history of bilateral nephrolithiasis who in fact recently establish care with urology presenting to the emergency room today with worsening left flank pain and concern for low blood pressure.  She been having intermittent left flank pain for approximately a week.  She was seen evaluated by Dr. Bernardo Heater on 06/18/2022.  At that time, KUB was nondiagnostic and her urinalysis was suspicious with culture still pending.  CT scan was ordered, she was placed on Flomax with culture pending with return precautions.  In the interim, she presented to the emergency room earlier today with concern for low blood pressure.  Checks has been having fairly low blood pressure and is recently taken off one of her blood pressure medicines but then restarted on this.  She is having weakness and near syncopal episodes and home blood pressure was quite low.  She denied any fevers or chills.  No dysuria or gross hematuria.  In the emergency room, she underwent CT stone protocol indicating Gratian of the 14 mm left kidney stone at the level of the UPJ J with perinephric stranding and mild to moderate hydronephrosis.  She does have a leukocytosis to 16 along with acute on chronic kidney injury, creatinine 4.07 baseline closer to 3.56 months ago.  Her urinalysis today still pending.  Blood pressure was low as 65/39 upon arrival to the emergency room.  She is not tachycardic.  Past Medical History:  Diagnosis Date   Anxiety    Chronic kidney disease    stones   Chronic pain    Complex regional pain syndrome I    right foot   Depression    Diabetes mellitus    Hypertension    Kidney stone    Neuromuscular disorder [facility])    Tremor     Past Surgical History:   Procedure Laterality Date   CERVICAL SPINE SURGERY     C6-7 fusion   CESAREAN SECTION     x2   dental implant  July 2015   ENDOSCOPIC PLANTAR FASCIOTOMY Right August 2015   GASTRIC BYPASS     HAND SURGERY Right    LITHOTRIPSY     multiple   PLANTAR FASCIECTOMY Right    SHOULDER SURGERY Left 10/2004   SMALL INTESTINE SURGERY     cervical laminectomy   utereroscopy  1995    Home Medications:  No outpatient medications have been marked as taking for the 06/21/22 encounter [facility] Encounter).    Allergies:  Allergies  Allergen Reactions   Penicillins Hives and Rash    Family History  Problem Relation Age of Onset   Cancer Mother        multiple myeloma   Diabetes Father    Kidney disease Father    Heart disease Father     Social History:  reports that she has never smoked. She has never used smokeless tobacco. She reports that she does not drink alcohol and does not use drugs.  ROS: A complete review of systems was performed.  All systems are negative except for pertinent findings as noted.  Physical Exam:  Vital signs in last 24 hours: Temp:  [97.6 F (36.4 C)] 97.6 F (36.4 C) (01/29 1330) Pulse Rate:  [71-89]  89 (01/29 1500) Resp:  [13-24] 18 (01/29 1420) BP: (65-115)/(29-57) 115/50 (01/29 1500) SpO2:  [95 %-100 %] 99 % (01/29 1500) Weight:  [109.8 kg] 109.8 kg (01/29 1331) Constitutional:  Alert and oriented, No acute distress HEENT: [place] AT, moist mucus membranes.  Trachea midline, no masses Cardiovascular: Regular rate and rhythm, no clubbing, cyanosis, or edema. Respiratory: Normal respiratory effort, lungs clear bilaterally GI: Abdomen is soft, nontender, nondistended, no abdominal masses Neurologic: Grossly intact, no focal deficits, moving all 4 extremities Psychiatric: Normal mood and affect     Laboratory Data:  Recent Labs    06/21/22 1338  WBC 16.1*  HGB 9.5*  HCT 29.9*   Recent Labs    06/21/22 1338  NA 126*  K 4.9  CL 98  CO2  20*  GLUCOSE 135*  BUN 63*  CREATININE 4.07*  CALCIUM 8.4*    Radiologic Imaging: IMPRESSION: Move of the previous 14 mm intrarenal stone on the left note the UPJ with moderate collecting system dilatation and stranding. Obstructing stone.   Nonobstructing right-sided renal stones.   Few enlarged retroperitoneal nodes identified in the left side are new from prior. These could be reactive but recommend attention on follow-up.   Gallstones.   Surgical changes involving the stomach and duodenal.   Slight increasing mesenteric haziness. Recommend attention on follow-up.     Electronically Signed   By: Jill Side M.D.   On: 06/21/2022 15:43  I personally reviewed the above CT scan and agree with radiologic interpretation.  Left nonobstructing pole is now migrated to the UPJ and causing dilation of the collecting system consistent with obstruction.    Impression/ Plan:  1.  Left UPJ stone with evidence of sepsis of urinary source-in the setting of obstructing renal calculus, leukocytosis, suspicious urinalysis last week along with significant hypotension, this meets criteria for sepsis.  As such, I recommended emergent left ureteral stent placement for urinary decompression and source control.  She is already received IV antibiotics in the form of cefepime and vancomycin.  We discussed ureteral stent placement including the risk of bleeding, infection, damage surrounding structures amongst others.  She understands that she will need further staged procedure down the road.  She understands the possibility of acute worsening of her urinary sepsis in the setting of stone manipulation.  Agree with admission to medicine and continued supportive care and admit addition to the above.  Follow-up outpatient cultures to guide antibiotic treatment.  2.  Acute on chronic CKD- Combination of prerenal as well as obstructive causes.  Should improve with hydration and relief of her  obstruction.   06/21/2022, 4:14 PM  Hollice Espy,  MD

## 2022-06-21 NOTE — H&P (View-Only) (Signed)
Urology Consult  I have been asked to see the patient by Dr. Jari Pigg, for evaluation and management of left flank pain.  Chief Complaint: left flank pain  History of Present Illness: Sabrina Williams is a 52 y.o. year old female with a known personal history of bilateral nephrolithiasis who in fact recently establish care with urology presenting to the emergency room today with worsening left flank pain and concern for low blood pressure.  She been having intermittent left flank pain for approximately a week.  She was seen evaluated by Dr. Bernardo Heater on 06/18/2022.  At that time, KUB was nondiagnostic and her urinalysis was suspicious with culture still pending.  CT scan was ordered, she was placed on Flomax with culture pending with return precautions.  In the interim, she presented to the emergency room earlier today with concern for low blood pressure.  Checks has been having fairly low blood pressure and is recently taken off one of her blood pressure medicines but then restarted on this.  She is having weakness and near syncopal episodes and home blood pressure was quite low.  She denied any fevers or chills.  No dysuria or gross hematuria.  In the emergency room, she underwent CT stone protocol indicating Gratian of the 14 mm left kidney stone at the level of the UPJ J with perinephric stranding and mild to moderate hydronephrosis.  She does have a leukocytosis to 16 along with acute on chronic kidney injury, creatinine 4.07 baseline closer to 3.56 months ago.  Her urinalysis today still pending.  Blood pressure was low as 65/39 upon arrival to the emergency room.  She is not tachycardic.  Past Medical History:  Diagnosis Date   Anxiety    Chronic kidney disease    stones   Chronic pain    Complex regional pain syndrome I    right foot   Depression    Diabetes mellitus    Hypertension    Kidney stone    Neuromuscular disorder [facility])    Tremor     Past Surgical History:   Procedure Laterality Date   CERVICAL SPINE SURGERY     C6-7 fusion   CESAREAN SECTION     x2   dental implant  July 2015   ENDOSCOPIC PLANTAR FASCIOTOMY Right August 2015   GASTRIC BYPASS     HAND SURGERY Right    LITHOTRIPSY     multiple   PLANTAR FASCIECTOMY Right    SHOULDER SURGERY Left 10/2004   SMALL INTESTINE SURGERY     cervical laminectomy   utereroscopy  1995    Home Medications:  No outpatient medications have been marked as taking for the 06/21/22 encounter [facility] Encounter).    Allergies:  Allergies  Allergen Reactions   Penicillins Hives and Rash    Family History  Problem Relation Age of Onset   Cancer Mother        multiple myeloma   Diabetes Father    Kidney disease Father    Heart disease Father     Social History:  reports that she has never smoked. She has never used smokeless tobacco. She reports that she does not drink alcohol and does not use drugs.  ROS: A complete review of systems was performed.  All systems are negative except for pertinent findings as noted.  Physical Exam:  Vital signs in last 24 hours: Temp:  [97.6 F (36.4 C)] 97.6 F (36.4 C) (01/29 1330) Pulse Rate:  [71-89]  89 (01/29 1500) Resp:  [13-24] 18 (01/29 1420) BP: (65-115)/(29-57) 115/50 (01/29 1500) SpO2:  [95 %-100 %] 99 % (01/29 1500) Weight:  [109.8 kg] 109.8 kg (01/29 1331) Constitutional:  Alert and oriented, No acute distress HEENT: [place] AT, moist mucus membranes.  Trachea midline, no masses Cardiovascular: Regular rate and rhythm, no clubbing, cyanosis, or edema. Respiratory: Normal respiratory effort, lungs clear bilaterally GI: Abdomen is soft, nontender, nondistended, no abdominal masses Neurologic: Grossly intact, no focal deficits, moving all 4 extremities Psychiatric: Normal mood and affect     Laboratory Data:  Recent Labs    06/21/22 1338  WBC 16.1*  HGB 9.5*  HCT 29.9*   Recent Labs    06/21/22 1338  NA 126*  K 4.9  CL 98  CO2  20*  GLUCOSE 135*  BUN 63*  CREATININE 4.07*  CALCIUM 8.4*    Radiologic Imaging: IMPRESSION: Move of the previous 14 mm intrarenal stone on the left note the UPJ with moderate collecting system dilatation and stranding. Obstructing stone.   Nonobstructing right-sided renal stones.   Few enlarged retroperitoneal nodes identified in the left side are new from prior. These could be reactive but recommend attention on follow-up.   Gallstones.   Surgical changes involving the stomach and duodenal.   Slight increasing mesenteric haziness. Recommend attention on follow-up.     Electronically Signed   By: Jill Side M.D.   On: 06/21/2022 15:43  I personally reviewed the above CT scan and agree with radiologic interpretation.  Left nonobstructing pole is now migrated to the UPJ and causing dilation of the collecting system consistent with obstruction.    Impression/ Plan:  1.  Left UPJ stone with evidence of sepsis of urinary source-in the setting of obstructing renal calculus, leukocytosis, suspicious urinalysis last week along with significant hypotension, this meets criteria for sepsis.  As such, I recommended emergent left ureteral stent placement for urinary decompression and source control.  She is already received IV antibiotics in the form of cefepime and vancomycin.  We discussed ureteral stent placement including the risk of bleeding, infection, damage surrounding structures amongst others.  She understands that she will need further staged procedure down the road.  She understands the possibility of acute worsening of her urinary sepsis in the setting of stone manipulation.  Agree with admission to medicine and continued supportive care and admit addition to the above.  Follow-up outpatient cultures to guide antibiotic treatment.  2.  Acute on chronic CKD- Combination of prerenal as well as obstructive causes.  Should improve with hydration and relief of her  obstruction.   06/21/2022, 4:14 PM  Hollice Espy,  MD

## 2022-06-21 NOTE — Anesthesia Preprocedure Evaluation (Signed)
Anesthesia Evaluation  Patient identified by MRN, date of birth, ID band Patient awake    Reviewed: Allergy & Precautions, H&P , NPO status , Patient's Chart, lab work & pertinent test results, reviewed documented beta blocker date and time   Airway Mallampati: II  TM Distance: >3 FB Neck ROM: full    Dental  (+) Teeth Intact   Pulmonary sleep apnea    Pulmonary exam normal        Cardiovascular Exercise Tolerance: Poor hypertension, On Medications Normal cardiovascular exam+ Valvular Problems/Murmurs  Rhythm:regular Rate:Normal     Neuro/Psych  PSYCHIATRIC DISORDERS Anxiety Depression     Neuromuscular disease    GI/Hepatic Neg liver ROS,GERD  Medicated,,  Endo/Other  negative endocrine ROSdiabetes    Renal/GU Renal disease  negative genitourinary   Musculoskeletal   Abdominal   Peds  Hematology  (+) Blood dyscrasia, anemia   Anesthesia Other Findings Past Medical History: No date: Anxiety No date: Chronic kidney disease     Comment:  stones No date: Chronic pain No date: Complex regional pain syndrome I     Comment:  right foot No date: Depression No date: Diabetes mellitus No date: Hypertension No date: Kidney stone No date: Neuromuscular disorder (HCC) No date: Tremor Past Surgical History: No date: CERVICAL SPINE SURGERY     Comment:  C6-7 fusion No date: CESAREAN SECTION     Comment:  x2 July 2015: dental implant August 2015: ENDOSCOPIC PLANTAR FASCIOTOMY; Right No date: GASTRIC BYPASS No date: HAND SURGERY; Right No date: LITHOTRIPSY     Comment:  multiple No date: PLANTAR FASCIECTOMY; Right 10/2004: SHOULDER SURGERY; Left No date: SMALL INTESTINE SURGERY     Comment:  cervical laminectomy 1995: utereroscopy BMI    Body Mass Index: 37.90 kg/m     Reproductive/Obstetrics negative OB ROS                             Anesthesia Physical Anesthesia Plan  ASA: 3  and emergent  Anesthesia Plan: General ETT   Post-op Pain Management:    Induction:   PONV Risk Score and Plan: 4 or greater  Airway Management Planned:   Additional Equipment:   Intra-op Plan:   Post-operative Plan:   Informed Consent: I have reviewed the patients History and Physical, chart, labs and discussed the procedure including the risks, benefits and alternatives for the proposed anesthesia with the patient or authorized representative who has indicated his/her understanding and acceptance.     Dental Advisory Given  Plan Discussed with: CRNA  Anesthesia Plan Comments:        Anesthesia Quick Evaluation

## 2022-06-21 NOTE — ED Triage Notes (Signed)
Pt sts that she has been having low blood pressure since last night. Pt sts that this has happened before. Pt sts that she is on pain meds however they have taken her off of her BP meds.

## 2022-06-21 NOTE — Progress Notes (Incomplete)
S/B:  extensive personal history of kidney stones presenting with hypotension, leukocytosis and concern for sepsis of urinary source with an obstructing 14 mm left UPJ stone.  A/R: Procedure today: Left retrograde pyelogram/ Left ureteral stent placement Bladder Pain is rated 5/10 currently. HR ST (113)  URO note: Plan: Continue supportive care, Plan for definitive stone treatment either ureteroscopy or ESWL in the next several weeks with Dr. Bernardo Heater for definitive management of her stone.

## 2022-06-21 NOTE — Progress Notes (Signed)
Received protocol consult for US guided IV due to vasopressor order. Spoke with RN Damaris Hippo who states pt is not currently receiving vasopressor. VAS Team available for any further needs. Consult cleared.

## 2022-06-21 NOTE — Progress Notes (Signed)
PHARMACY -  BRIEF ANTIBIOTIC NOTE   Pharmacy has received consult(s) for vancomycin and cefepime from an ED provider.  The patient's profile has been reviewed for ht/wt/allergies/indication/available labs.    One time order(s) placed for Vancomycin 2000 mg IV and Cefepime 2 gm IV  Further antibiotics/pharmacy consults should be ordered by admitting physician if indicated.                       Thank you, Alison Murray 06/21/2022  3:24 PM

## 2022-06-22 ENCOUNTER — Encounter: Payer: Self-pay | Admitting: Urology

## 2022-06-22 ENCOUNTER — Other Ambulatory Visit: Payer: Self-pay | Admitting: Physician Assistant

## 2022-06-22 DIAGNOSIS — N189 Chronic kidney disease, unspecified: Secondary | ICD-10-CM | POA: Diagnosis not present

## 2022-06-22 DIAGNOSIS — N201 Calculus of ureter: Secondary | ICD-10-CM

## 2022-06-22 DIAGNOSIS — A419 Sepsis, unspecified organism: Secondary | ICD-10-CM | POA: Diagnosis not present

## 2022-06-22 DIAGNOSIS — N289 Disorder of kidney and ureter, unspecified: Secondary | ICD-10-CM | POA: Diagnosis not present

## 2022-06-22 LAB — URINALYSIS, ROUTINE W REFLEX MICROSCOPIC
Bilirubin Urine: NEGATIVE
Glucose, UA: NEGATIVE mg/dL
Ketones, ur: NEGATIVE mg/dL
Nitrite: POSITIVE — AB
Protein, ur: 100 mg/dL — AB
RBC / HPF: >50 RBC/hpf (ref 0–5)
Specific Gravity, Urine: 1.008 (ref 1.005–1.030)
WBC, UA: >50 WBC/hpf (ref 0–5)
pH: 6 (ref 5.0–8.0)

## 2022-06-22 LAB — BLOOD CULTURE ID PANEL (REFLEXED) - BCID2

## 2022-06-22 LAB — CBC
HCT: 30.1 % — ABNORMAL LOW (ref 36.0–46.0)
Hemoglobin: 9.7 g/dL — ABNORMAL LOW (ref 12.0–15.0)
MCH: 30.6 pg (ref 26.0–34.0)
MCHC: 32.2 g/dL (ref 30.0–36.0)
MCV: 95 fL (ref 80.0–100.0)
Platelets: 220 10*3/uL (ref 150–400)
RBC: 3.17 MIL/uL — ABNORMAL LOW (ref 3.87–5.11)
RDW: 12.4 % (ref 11.5–15.5)
WBC: 17.2 10*3/uL — ABNORMAL HIGH (ref 4.0–10.5)
nRBC: 0 % (ref 0.0–0.2)

## 2022-06-22 LAB — COMPREHENSIVE METABOLIC PANEL
ALT: 18 U/L (ref 0–44)
AST: 16 U/L (ref 15–41)
Albumin: 2.3 g/dL — ABNORMAL LOW (ref 3.5–5.0)
Alkaline Phosphatase: 126 U/L (ref 38–126)
Anion gap: 6 (ref 5–15)
BUN: 58 mg/dL — ABNORMAL HIGH (ref 6–20)
CO2: 18 mmol/L — ABNORMAL LOW (ref 22–32)
Calcium: 7.9 mg/dL — ABNORMAL LOW (ref 8.9–10.3)
Chloride: 107 mmol/L (ref 98–111)
Creatinine, Ser: 3.58 mg/dL — ABNORMAL HIGH (ref 0.44–1.00)
GFR, Estimated: 15 mL/min — ABNORMAL LOW (ref >60)
Glucose, Bld: 123 mg/dL — ABNORMAL HIGH (ref 70–99)
Potassium: 4.8 mmol/L (ref 3.5–5.1)
Sodium: 131 mmol/L — ABNORMAL LOW (ref 135–145)
Total Bilirubin: 0.7 mg/dL (ref 0.3–1.2)
Total Protein: 5.2 g/dL — ABNORMAL LOW (ref 6.5–8.1)

## 2022-06-22 LAB — PHOSPHORUS: Phosphorus: 4.9 mg/dL — ABNORMAL HIGH (ref 2.5–4.6)

## 2022-06-22 LAB — C-REACTIVE PROTEIN: CRP: 27.1 mg/dL — ABNORMAL HIGH (ref <1.0)

## 2022-06-22 LAB — MAGNESIUM: Magnesium: 2.1 mg/dL (ref 1.7–2.4)

## 2022-06-22 MED ORDER — HEPARIN SODIUM (PORCINE) 5000 UNIT/ML IJ SOLN
5000.0000 [IU] | Freq: Two times a day (BID) | INTRAMUSCULAR | Status: DC
  Administered 2022-06-22 – 2022-06-24 (×4): 5000 [IU] via SUBCUTANEOUS
  Filled 2022-06-22 (×4): qty 1

## 2022-06-22 MED ORDER — GABAPENTIN 100 MG PO CAPS
100.0000 mg | ORAL_CAPSULE | Freq: Every day | ORAL | Status: DC
  Administered 2022-06-22 – 2022-06-24 (×3): 100 mg via ORAL
  Filled 2022-06-22 (×3): qty 1

## 2022-06-22 MED ORDER — SODIUM CHLORIDE 0.9 % IV SOLN
2.0000 g | INTRAVENOUS | Status: DC
  Administered 2022-06-22 – 2022-06-24 (×3): 2 g via INTRAVENOUS
  Filled 2022-06-22: qty 20
  Filled 2022-06-22: qty 2
  Filled 2022-06-22: qty 20
  Filled 2022-06-22: qty 2

## 2022-06-22 MED ORDER — TAMSULOSIN HCL 0.4 MG PO CAPS
0.4000 mg | ORAL_CAPSULE | Freq: Every day | ORAL | Status: DC
  Administered 2022-06-22 – 2022-06-24 (×3): 0.4 mg via ORAL
  Filled 2022-06-22 (×3): qty 1

## 2022-06-22 MED ORDER — OXYCODONE HCL 5 MG PO TABS
5.0000 mg | ORAL_TABLET | Freq: Four times a day (QID) | ORAL | Status: DC | PRN
  Administered 2022-06-22 – 2022-06-24 (×4): 5 mg via ORAL
  Filled 2022-06-22 (×5): qty 1

## 2022-06-22 MED ORDER — OXYBUTYNIN CHLORIDE 5 MG PO TABS
5.0000 mg | ORAL_TABLET | Freq: Three times a day (TID) | ORAL | Status: DC | PRN
  Administered 2022-06-22: 5 mg via ORAL
  Filled 2022-06-22 (×2): qty 1

## 2022-06-22 NOTE — Progress Notes (Signed)
PHARMACY - PHYSICIAN COMMUNICATION CRITICAL VALUE ALERT - BLOOD CULTURE IDENTIFICATION (BCID)  Sabrina Williams is an 52 y.o. female who presented to [facility] on 06/21/2022 with a chief complaint of low blood pressure and passing out.   Assessment:  blood cultures from 1/29 with GNR and BCID detects E coli. Urinary source and is s/p ureteral stenting for L obstructing stone.    Name of physician (or Provider) Contacted: Dr. Feliberto Gottron  Current antibiotics: Cefepime  Changes to prescribed antibiotics recommended:  Recommendations accepted by provider  Results for orders placed or performed during the hospital encounter of 06/21/22  Blood Culture ID Panel (Reflexed) (Collected: 06/21/2022  4:14 PM)  Result Value Ref Range   Enterococcus faecalis NOT DETECTED NOT DETECTED   Enterococcus Faecium NOT DETECTED NOT DETECTED   Listeria monocytogenes NOT DETECTED NOT DETECTED   Staphylococcus species NOT DETECTED NOT DETECTED   Staphylococcus aureus (BCID) NOT DETECTED NOT DETECTED   Staphylococcus epidermidis NOT DETECTED NOT DETECTED   Staphylococcus lugdunensis NOT DETECTED NOT DETECTED   Streptococcus species NOT DETECTED NOT DETECTED   Streptococcus agalactiae NOT DETECTED NOT DETECTED   Streptococcus pneumoniae NOT DETECTED NOT DETECTED   Streptococcus pyogenes NOT DETECTED NOT DETECTED   A.calcoaceticus-baumannii NOT DETECTED NOT DETECTED   Bacteroides fragilis NOT DETECTED NOT DETECTED   Enterobacterales DETECTED (A) NOT DETECTED   Enterobacter cloacae complex NOT DETECTED NOT DETECTED   Escherichia coli DETECTED (A) NOT DETECTED   Klebsiella aerogenes NOT DETECTED NOT DETECTED   Klebsiella oxytoca NOT DETECTED NOT DETECTED   Klebsiella pneumoniae NOT DETECTED NOT DETECTED   Proteus species NOT DETECTED NOT DETECTED   Salmonella species NOT DETECTED NOT DETECTED   Serratia marcescens NOT DETECTED NOT DETECTED   Haemophilus influenzae NOT DETECTED NOT DETECTED   Neisseria  meningitidis NOT DETECTED NOT DETECTED   Pseudomonas aeruginosa NOT DETECTED NOT DETECTED   Stenotrophomonas maltophilia NOT DETECTED NOT DETECTED   Candida albicans NOT DETECTED NOT DETECTED   Candida auris NOT DETECTED NOT DETECTED   Candida glabrata NOT DETECTED NOT DETECTED   Candida krusei NOT DETECTED NOT DETECTED   Candida parapsilosis NOT DETECTED NOT DETECTED   Candida tropicalis NOT DETECTED NOT DETECTED   Cryptococcus neoformans/gattii NOT DETECTED NOT DETECTED   CTX-M ESBL NOT DETECTED NOT DETECTED   Carbapenem resistance IMP NOT DETECTED NOT DETECTED   Carbapenem resistance KPC NOT DETECTED NOT DETECTED   Carbapenem resistance NDM NOT DETECTED NOT DETECTED   Carbapenem resist OXA 48 LIKE NOT DETECTED NOT DETECTED   Carbapenem resistance VIM NOT DETECTED NOT DETECTED    Doreene Eland, PharmD, BCPS, BCIDP Work Cell: [phone] 06/22/2022 10:27 AM

## 2022-06-22 NOTE — Plan of Care (Signed)

## 2022-06-22 NOTE — Progress Notes (Signed)
  Progress Note   Patient: Sabrina Williams MOQ:[id number] DOB: 08/11/1970 DOA: 06/21/2022     1 DOS: the patient was seen and examined on 06/22/2022   Brief hospital course:  Assessment and Plan: Sepsis in the setting of L sided kidney stone - IV NS 85 cc/hr  - IV ceftriaxone 2 g daily  - Renal diet  - s/p stent by urology  - IV morphine 1 mg q3 hr PRN  - Oxycodone 5 mg PO q6 hr PRN  - Ditropan 5 mg PO q8 hr PRN  - Flomax 0.4 mg PO daily   UTI - IV antibx as above    AKI on CKD - IV antibx/fluids as above    Acute HypoNa+ - IV fluids as above  - Na+ rising slowly   5. Gram neg bacteremia (e.coli/enterobacterales)  - IV ceftriaxone as above  - Repeat blood cx's ordered 06/22/2022 to document clearance    DVT prophylaxis: Heparin 5000 units sq q12  GI prophylaxis: Protonix 20 mg PO daily      Subjective: Pt seen and examined at the bedside. She feels better today. BP has stabilized. IV antibx changed to IV ceftriaxone. Repeat blood cx's ordered 06/22/2022. Transfer order placed to med-tele bed.   Physical Exam: Vitals:   06/22/22 0530 06/22/22 0600 06/22/22 0630 06/22/22 0930  BP: 107/62 131/69 112/60 115/63  Pulse: (!) 104 (!) 108 (!) 101 96  Resp: 12 (!) 21 12 17   Temp:    98.4 F (36.9 C)  TempSrc:    Oral  SpO2: 96% 100% 94% 95%  Weight:      Height:       Constitutional:      Appearance: Normal appearance.  HENT:     Head: Normocephalic and atraumatic.     Mouth/Throat:     Mouth: Mucous membranes are moist  Cardiovascular:     Rate and Rhythm: Normal rate and regular rhythm.  Pulmonary:     Effort: Pulmonary effort is normal.     Breath sounds: Normal breath sounds.  Abdominal:     Palpations: Abdomen is soft.     Comments: Obese Musculoskeletal:        General: Normal range of motion.     Cervical back: Neck supple.  Skin:    General: Skin is dry.  Neurological:     Mental Status: She is alert. Mental status is at baseline.  Psychiatric:         Mood and Affect: Mood normal.  Data Reviewed:   Disposition: Status is: Inpatient  Planned Discharge Destination: Barriers to discharge: IV antibx for gram neg bacteremia     Time spent: 35 minutes  Author: Lucienne Minks , MD 06/22/2022 2:57 PM  For on call review [email or web address].

## 2022-06-22 NOTE — Anesthesia Postprocedure Evaluation (Signed)
Anesthesia Post Note  Patient: Sabrina Williams  Procedure(s) Performed: CYSTOSCOPY WITH RETROGRADE PYELOGRAM, URETEROSCOPY AND STENT PLACEMENT (Left)  Patient location during evaluation: ICU Anesthesia Type: General Level of consciousness: awake and alert Pain management: pain level controlled Vital Signs Assessment: post-procedure vital signs reviewed and stable Respiratory status: spontaneous breathing, nonlabored ventilation and respiratory function stable Cardiovascular status: stable Postop Assessment: no apparent nausea or vomiting Anesthetic complications: no   No notable events documented.   Last Vitals:  Vitals:   06/22/22 0600 06/22/22 0630  BP: 131/69 112/60  Pulse: (!) 108 (!) 101  Resp: (!) 21 12  Temp:    SpO2: 100% 94%    Last Pain:  Vitals:   06/22/22 0600  TempSrc:   PainSc: 0-No pain                 Sterlin Knightly Lily Peer

## 2022-06-22 NOTE — Progress Notes (Signed)
Urology Inpatient Progress Note  Subjective: No acute events overnight.  She remains in the ICU but is no longer requiring pressor support.  She is afebrile and normotensive this morning, with mild persistent tachycardia. Creatinine down today, 3.58.  WBC count up, 17.2.  Hemoglobin stable, 9.7.  Urine culture from 06/18/2022 is pending.  Blood cultures are pending with no growth at <12 hours. She is spontaneously voiding clear, yellow urine. Today she reports persistent bitemporal headache and some left flank discomfort.  Anti-infectives: Anti-infectives (From admission, onward)    Start     Dose/Rate Route Frequency Ordered Stop   06/22/22 1800  ceFEPIme (MAXIPIME) 2 g in sodium chloride 0.9 % 100 mL IVPB        2 g 200 mL/hr over 30 Minutes Intravenous Every 24 hours 06/21/22 2001     06/22/22 0600  ceFEPIme (MAXIPIME) 2 g in sodium chloride 0.9 % 100 mL IVPB  Status:  Discontinued        2 g 200 mL/hr over 30 Minutes Intravenous Every 12 hours 06/21/22 1612 06/21/22 2001   06/21/22 1600  vancomycin (VANCOREADY) IVPB 2000 mg/400 mL        2,000 mg 200 mL/hr over 120 Minutes Intravenous  Once 06/21/22 1524 06/21/22 2054   06/21/22 1515  ceFEPIme (MAXIPIME) 2 g in sodium chloride 0.9 % 100 mL IVPB        2 g 200 mL/hr over 30 Minutes Intravenous  Once 06/21/22 1512 06/21/22 1824   06/21/22 1515  metroNIDAZOLE (FLAGYL) IVPB 500 mg        500 mg 100 mL/hr over 60 Minutes Intravenous  Once 06/21/22 1512 06/21/22 1831   06/21/22 1515  vancomycin (VANCOCIN) IVPB 1000 mg/200 mL premix  Status:  Discontinued        1,000 mg 200 mL/hr over 60 Minutes Intravenous  Once 06/21/22 1512 06/21/22 1524       Current Facility-Administered Medications  Medication Dose Route Frequency Provider Last Rate Last Admin   0.9 %  sodium chloride infusion   Intravenous Continuous Lucienne Minks, MD 150 mL/hr at 06/22/22 0630 Infusion Verify at 06/22/22 0630   0.9 %  sodium chloride infusion  250 mL  Intravenous Continuous Darrick Penna, RPH       acetaminophen (TYLENOL) tablet 650 mg  650 mg Oral Q6H PRN Lucienne Minks, MD   650 mg at 06/22/22 0930   ceFEPIme (MAXIPIME) 2 g in sodium chloride 0.9 % 100 mL IVPB  2 g Intravenous Q24H Nazari, Walid A, RPH       Chlorhexidine Gluconate Cloth 2 % PADS 6 each  6 each Topical Daily Lucienne Minks, MD   6 each at 06/22/22 0827   DULoxetine (CYMBALTA) DR capsule 30 mg  30 mg Oral Daily Lucienne Minks, MD   30 mg at 06/22/22 0826   gabapentin (NEURONTIN) capsule 100 mg  100 mg Oral Q1400 Lucienne Minks, MD   100 mg at 06/22/22 0827   morphine (PF) 2 MG/ML injection 1 mg  1 mg Intravenous Q3H PRN Lucienne Minks, MD   1 mg at 06/21/22 2115   multivitamin with minerals tablet 1 tablet  1 tablet Oral Daily Lucienne Minks, MD   1 tablet at 06/22/22 0826   norepinephrine (LEVOPHED) 4mg  in 230mL (0.016 mg/mL) premix infusion  2-10 mcg/min Intravenous Titrated Darrick Penna, [facility]   Stopped at 06/22/22 0602   nortriptyline (PAMELOR) capsule 75 mg  75 mg Oral QHS Lucienne Minks, MD  75 mg at 06/21/22 2127   ondansetron (ZOFRAN) injection 4 mg  4 mg Intravenous Q6H PRN Lucienne Minks, MD       oxyCODONE (Oxy IR/ROXICODONE) immediate release tablet 5 mg  5 mg Oral Q6H PRN Lucienne Minks, MD       pantoprazole (PROTONIX) EC tablet 20 mg  20 mg Oral Daily Lucienne Minks, MD   20 mg at 06/22/22 0826   pravastatin (PRAVACHOL) tablet 20 mg  20 mg Oral q1800 Lucienne Minks, MD       Objective: Vital signs in last 24 hours: Temp:  [97.1 F (36.2 C)-97.9 F (36.6 C)] 97.9 F (36.6 C) (01/30 0500) Pulse Rate:  [71-117] 101 (01/30 0630) Resp:  [12-25] 12 (01/30 0630) BP: (65-144)/(29-92) 112/60 (01/30 0630) SpO2:  [93 %-100 %] 94 % (01/30 0630) Weight:  [108.9 kg-117.5 kg] 117.5 kg (01/29 1823)  Intake/Output from previous day: 01/29 0701 - 01/30 0700 In: 6177 [P.O.:118; I.V.:1757.5; IV Piggyback:4301.5] Out: 2250 [Urine:2250] Intake/Output this shift: No  intake/output data recorded.  Physical Exam Vitals and nursing note reviewed.  Constitutional:      General: She is not in acute distress.    Appearance: She is not ill-appearing, toxic-appearing or diaphoretic.  HENT:     Head: Normocephalic and atraumatic.  Pulmonary:     Effort: Pulmonary effort is normal. No respiratory distress.  Skin:    General: Skin is warm and dry.  Neurological:     Mental Status: She is alert and oriented to person, place, and time.  Psychiatric:        Mood and Affect: Mood normal.        Behavior: Behavior normal.    Lab Results:  Recent Labs    06/21/22 1338 06/22/22 0222  WBC 16.1* 17.2*  HGB 9.5* 9.7*  HCT 29.9* 30.1*  PLT 219 220   BMET Recent Labs    06/21/22 1338 06/22/22 0222  NA 126* 131*  K 4.9 4.8  CL 98 107  CO2 20* 18*  GLUCOSE 135* 123*  BUN 63* 58*  CREATININE 4.07* 3.58*  CALCIUM 8.4* 7.9*   Studies/Results: DG OR UROLOGY CYSTO IMAGE ([facility] ONLY)  Result Date: 06/21/2022 There is no interpretation for this exam.  This order is for images obtained during a surgical procedure.  Please See "Surgeries" Tab for more information regarding the procedure.   CT Renal Stone Study  Result Date: 06/21/2022 CLINICAL DATA:  Flank pain.  Low blood pressure. EXAM: CT ABDOMEN AND PELVIS WITHOUT CONTRAST TECHNIQUE: Multidetector CT imaging of the abdomen and pelvis was performed following the standard protocol without IV contrast. RADIATION DOSE REDUCTION: This exam was performed according to the departmental dose-optimization program which includes automated exposure control, adjustment of the mA and/or kV according to patient size and/or use of iterative reconstruction technique. COMPARISON:  CT 12/03/2021 and older. X-ray 06/18/2022. Renal ultrasound 12/03/2021 and older FINDINGS: Lower chest: Breathing motion at the lung bases. Mild left basilar scar or atelectasis. No pleural effusion. Small hiatal hernia. Hepatobiliary: On this non IV  contrast exam, the liver is grossly preserved. Dependent stones along the nondilated gallbladder. Pancreas: Moderate pancreatic atrophy. No obvious mass on this noncontrast exam. Spleen: Spleen is nonenlarged.  Small hilar splenule. Adrenals/Urinary Tract: The adrenal glands are preserved. Lobular contour to the right kidney with atrophy. Punctate nonobstructing right-sided renal stones are seen. At least 2 stones are seen with the largest proximally 4 mm at the lower pole. No specific abnormal calcification along the course  of the normal caliber right ureter. Preserved contours of the urinary bladder. There is moderate collecting system dilatation along the left kidney which is new. The previous 14 mm stone in the left kidney is now seen at the UPJ on coronal image 78. Increasing perinephric stranding on the left as well. No additional more distal ureteral stones. Stomach/Bowel: On this non oral contrast exam the large bowel demonstrates a redundant course to the sigmoid colon with scattered stool. Normal retrocecal appendix. 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 Please correlate with surgical history. Increasing central mesenteric haziness. No free air or fluid collection. Vascular/Lymphatic: Normal caliber aorta and IVC. Scattered atherosclerotic mild changes. 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 Unchanged from prior. There are some small retroperitoneal nodes identified on the left side. Although these are not pathologically are increased from previous. Example on series 3, image 35 has a node measuring now 11 by 9 mm which previously would have measured less than 5 mm. These could be reactive but has a differential recommend attention on follow-up. Reproductive: Uterus and bilateral adnexa are unremarkable. Other: Mild anasarca.  Small fat containing umbilical hernia.  Musculoskeletal: Scattered degenerative changes along the spine. IMPRESSION: Move of the previous 14 mm intrarenal stone on the left note the UPJ with moderate collecting system dilatation and stranding. Obstructing stone. Nonobstructing right-sided renal stones. Few enlarged retroperitoneal nodes identified in the left side are new from prior. These could be reactive but recommend attention on follow-up. Gallstones. Surgical changes involving the stomach and duodenal. Slight increasing mesenteric haziness. Recommend attention on follow-up. Electronically Signed   By: Jill Side M.D.   On: 06/21/2022 15:43   CT HEAD WO CONTRAST (5MM)  Result Date: 06/21/2022 CLINICAL DATA:  Headache and neuro deficit. EXAM: CT HEAD WITHOUT CONTRAST TECHNIQUE: Contiguous axial images were obtained from the base of the skull through the vertex without intravenous contrast. RADIATION DOSE REDUCTION: This exam was performed according to the departmental dose-optimization program which includes automated exposure control, adjustment of the mA and/or kV according to patient size and/or use of iterative reconstruction technique. COMPARISON:  None Available. FINDINGS: Brain: Mildly age advanced cerebral atrophy with resultant prominence of the extra-axial spaces adjacent the frontal lobes. No mass lesion, hemorrhage, hydrocephalus, acute infarct, intra-axial, or extra-axial fluid collection. Vascular: No hyperdense vessel or unexpected calcification. Skull: No skull fracture. Sinuses/Orbits: Normal imaged portions of the orbits and globes. Clear paranasal sinuses and mastoid air cells. Other: None. IMPRESSION: 1. No acute intracranial abnormality. 2. Mildly age advanced cerebral atrophy. Electronically Signed   By: Abigail Miyamoto M.D.   On: 06/21/2022 15:41    Assessment & Plan: 52 year old female POD 1 from left ureteral stent placement with Dr. Erlene Quan for management of an obstructing 14 mm left UPJ stone with sepsis of probable  urinary source.  She is clinically improving today and no longer requiring pressor support.  She is having some stent discomfort but is voiding spontaneously.  We discussed common stent symptoms including flank pain, bladder pain, urgency, frequency, dysuria, and gross hematuria.  We discussed that she will require follow-up left ureteroscopy with laser lithotripsy and stent exchange in 2 to 3 weeks for definitive management of her stone.  She expressed understanding.  Recommendations: -Continue empiric antibiotics and follow cultures for total of 14 days of culture appropriate therapy -Consider Flomax 0.4 mg daily and oxybutynin 5 mg every 8  as needed for management of stent discomfort -Outpatient left URS/LL/stent exchange in 2 to 3 weeks for definitive stone management, our surgical scheduler will contact her to arrange  Debroah Loop, PA-C 06/22/2022

## 2022-06-22 NOTE — Progress Notes (Signed)
Surgical Physician Order Form [facility]  Dr. Bernardo Heater * Scheduling expectation :  2-3 weeks  *Length of Case:   *Clearance needed: no  *Anticoagulation Instructions: N/A  *Aspirin Instructions: N/A  *Post-op visit Date/Instructions:   TBD  *Diagnosis: Left UPJ Stone  *Procedure: left  Ureteroscopy w/laser lithotripsy & stent exchange (11552)   Additional orders: N/A  -Admit type: OUTpatient  -Anesthesia: General  -VTE Prophylaxis Standing Order SCD's       Other:   -Standing Lab Orders Per Anesthesia    Lab other: None  -Standing Test orders EKG/Chest x-ray per Anesthesia       Test other:   - Medications:   TBD per inpatient cultures  -Other orders:  N/A

## 2022-06-22 NOTE — Progress Notes (Signed)
1910 patient alert x4 able to make all needs known IV in LFA leaking unable to flush Moved .09 to lower IV right AC unable to flush removed IV patient bath given suction off on pure-wick patient bed was full urine new pure-wick placed after bath call light in reach no pain noted

## 2022-06-23 DIAGNOSIS — A419 Sepsis, unspecified organism: Secondary | ICD-10-CM | POA: Diagnosis not present

## 2022-06-23 DIAGNOSIS — N200 Calculus of kidney: Secondary | ICD-10-CM | POA: Diagnosis not present

## 2022-06-23 DIAGNOSIS — N184 Chronic kidney disease, stage 4 (severe): Secondary | ICD-10-CM | POA: Diagnosis not present

## 2022-06-23 DIAGNOSIS — I959 Hypotension, unspecified: Secondary | ICD-10-CM

## 2022-06-23 DIAGNOSIS — N390 Urinary tract infection, site not specified: Secondary | ICD-10-CM

## 2022-06-23 DIAGNOSIS — B9620 Unspecified Escherichia coli [E. coli] as the cause of diseases classified elsewhere: Secondary | ICD-10-CM

## 2022-06-23 DIAGNOSIS — R6520 Severe sepsis without septic shock: Secondary | ICD-10-CM

## 2022-06-23 LAB — CBC
HCT: 29.4 % — ABNORMAL LOW (ref 36.0–46.0)
Hemoglobin: 9.5 g/dL — ABNORMAL LOW (ref 12.0–15.0)
MCH: 30.4 pg (ref 26.0–34.0)
MCHC: 32.3 g/dL (ref 30.0–36.0)
MCV: 94.2 fL (ref 80.0–100.0)
Platelets: 248 10*3/uL (ref 150–400)
RBC: 3.12 MIL/uL — ABNORMAL LOW (ref 3.87–5.11)
RDW: 12.7 % (ref 11.5–15.5)
WBC: 13.2 10*3/uL — ABNORMAL HIGH (ref 4.0–10.5)
nRBC: 0 % (ref 0.0–0.2)

## 2022-06-23 LAB — C-REACTIVE PROTEIN: CRP: 24.7 mg/dL — ABNORMAL HIGH (ref <1.0)

## 2022-06-23 LAB — PROCALCITONIN: Procalcitonin: 1.81 ng/mL

## 2022-06-23 LAB — COMPREHENSIVE METABOLIC PANEL
ALT: 16 U/L (ref 0–44)
AST: 11 U/L — ABNORMAL LOW (ref 15–41)
Albumin: 2 g/dL — ABNORMAL LOW (ref 3.5–5.0)
Alkaline Phosphatase: 119 U/L (ref 38–126)
Anion gap: 5 (ref 5–15)
BUN: 46 mg/dL — ABNORMAL HIGH (ref 6–20)
CO2: 20 mmol/L — ABNORMAL LOW (ref 22–32)
Calcium: 8.3 mg/dL — ABNORMAL LOW (ref 8.9–10.3)
Chloride: 112 mmol/L — ABNORMAL HIGH (ref 98–111)
Creatinine, Ser: 2.78 mg/dL — ABNORMAL HIGH (ref 0.44–1.00)
GFR, Estimated: 20 mL/min — ABNORMAL LOW (ref >60)
Glucose, Bld: 97 mg/dL (ref 70–99)
Potassium: 4.4 mmol/L (ref 3.5–5.1)
Sodium: 137 mmol/L (ref 135–145)
Total Bilirubin: 0.6 mg/dL (ref 0.3–1.2)
Total Protein: 5.1 g/dL — ABNORMAL LOW (ref 6.5–8.1)

## 2022-06-23 LAB — MAGNESIUM: Magnesium: 1.8 mg/dL (ref 1.7–2.4)

## 2022-06-23 LAB — PHOSPHORUS: Phosphorus: 4 mg/dL (ref 2.5–4.6)

## 2022-06-23 MED ORDER — DULOXETINE HCL 30 MG PO CPEP
60.0000 mg | ORAL_CAPSULE | Freq: Every day | ORAL | Status: DC
  Administered 2022-06-24: 60 mg via ORAL
  Filled 2022-06-23: qty 2

## 2022-06-23 NOTE — Plan of Care (Signed)

## 2022-06-23 NOTE — Progress Notes (Signed)
[facility] Progress Note  Patient: Sabrina Williams    NLG:[id number]  DOA: 06/21/2022    Date of Service: the patient was seen and examined on 06/23/2022  Brief hospital course: She is 52 year old female with past medical history of morbid obesity, stage IV CKD and hypertension presented to the emergency room on 1/29 with complaints of left-sided flank pain and hypotension.  Workup revealed severe sepsis secondary to UTI from obstructing stone.  Patient started on IV fluids and antibiotics and urology consulted.  Patient taken that same day for left retrograde pyelogram with ureteral stent placement.  Since then, patient's labs have continued to improve with blood cultures growing out positive for E. coli with sensitivities pending.   Assessment and Plan: E. coli severe sepsis secondary to UTI: Meets criteria with urinary source, hypotension, tachycardia, tachypnea and leukocytosis.  Sensitivities to E. coli pending.  Responding to Rocephin.  Once we get sensitivities, change over to p.o. antibiotics and patient will need a total of 14 days of therapy.  Outpatient follow-up with urology for removal of stent  Hypertension: Blood pressure is stable  Stage IV CKD: At baseline.  Morbid obesity: Meets criteria with BMI greater than 40  Body mass index is 40.57 kg/m.        Consultants: Urology  Procedures: Pyelogram with ureteral stent placement  Antimicrobials: IV Rocephin 1/29-present  Code Status: Full code   Subjective: Complains of some mild flank pain, fatigue, overall improved  Objective: Vital signs were reviewed and unremarkable. Vitals:   06/23/22 0651 06/23/22 0819  BP: 137/81 116/65  Pulse: 90 90  Resp: 18 18  Temp: 98.4 F (36.9 C) 97.7 F (36.5 C)  SpO2: 98% 95%    Intake/Output Summary (Last 24 hours) at 06/23/2022 1556 Last data filed at 06/23/2022 0700 Gross per 24 hour  Intake 2566.14 ml  Output 800 ml  Net 1766.14 ml   Filed Weights    06/21/22 1331 06/21/22 1646 06/21/22 1823  Weight: 109.8 kg 108.9 kg 117.5 kg   Body mass index is 40.57 kg/m.  Exam:  General: Alert and oriented x 3, no acute distress HEENT: Normocephalic, atraumatic, mucous membranes are moist Cardiovascular: Regular rate and rhythm, S1-S2 Respiratory: Clear to auscultation bilaterally Abdomen: Soft, nontender, nondistended, positive bowel sounds Musculoskeletal: No clubbing or cyanosis or edema Skin: No skin breaks, tears or lesions Psychiatry: Appropriate, no evidence of psychoses Neurology: No focal deficits  Data Reviewed: creatinine 2.78 with GFR of 20.  CRP down to 24.7.  White blood cell count down to 13.2.  Disposition:  Status is: Inpatient Remains inpatient appropriate because:  -Waiting for E. coli sensitivities    Anticipated discharge date: 2/1  Family Communication: Husband at the bedside DVT Prophylaxis: heparin injection 5,000 Units Start: 06/22/22 2200 Place and maintain sequential compression device Start: 06/21/22 1626    Author: Annita Brod ,MD 06/23/2022 3:56 PM  To reach On-call, see care teams to locate the attending and reach out via [email or web address]. Between 7PM-7AM, please contact night-coverage If you still have difficulty reaching the attending provider, please page the [facility] (Director on Call) for [facility] on amion for assistance.

## 2022-06-23 NOTE — Progress Notes (Signed)
Pt transferred to unit from ICU, pt alert/oriented x4, IV patent and infusing, skin intact, telemetry monitor placed. Vitals stable.

## 2022-06-23 NOTE — Progress Notes (Signed)
0625 report called to RN for patient to transfer to room 220 patient going by [facility] alert x4 on room air all patient belongings taken with patient

## 2022-06-23 NOTE — TOC CM/SW Note (Signed)
  Transition of Care [facility]) Screening Note   Patient Details  Name: Sabrina Williams Date of Birth: Dec 15, 1970   [facility] of [facility]) CM/SW Contact:    Candie Chroman, LCSW Phone Number: 06/23/2022, 9:13 AM    Transition of Care Department [facility]) has reviewed patient and no TOC needs have been identified at this time. We will continue to monitor patient advancement through interdisciplinary progression rounds. If new patient transition needs arise, please place a TOC consult.

## 2022-06-23 NOTE — Hospital Course (Addendum)
She is 53 year old female with past medical history of morbid obesity, stage IV CKD and hypertension presented to the emergency room on 1/29 with complaints of left-sided flank pain and hypotension.  Workup revealed severe sepsis secondary to UTI from obstructing stone.  Patient started on IV fluids and antibiotics and urology consulted.  Patient taken that same day for left retrograde pyelogram with ureteral stent placement.  Since then, patient's labs have continued to improve with blood cultures growing out positive for E. coli with sensitivities pending.

## 2022-06-24 DIAGNOSIS — I10 Essential (primary) hypertension: Secondary | ICD-10-CM | POA: Diagnosis not present

## 2022-06-24 DIAGNOSIS — A419 Sepsis, unspecified organism: Secondary | ICD-10-CM | POA: Diagnosis present

## 2022-06-24 DIAGNOSIS — N1832 Chronic kidney disease, stage 3b: Secondary | ICD-10-CM

## 2022-06-24 DIAGNOSIS — A4181 Sepsis due to Enterococcus: Secondary | ICD-10-CM

## 2022-06-24 DIAGNOSIS — N200 Calculus of kidney: Secondary | ICD-10-CM | POA: Diagnosis not present

## 2022-06-24 LAB — CULTURE, URINE COMPREHENSIVE

## 2022-06-24 LAB — BASIC METABOLIC PANEL
Anion gap: 5 (ref 5–15)
BUN: 41 mg/dL — ABNORMAL HIGH (ref 6–20)
CO2: 19 mmol/L — ABNORMAL LOW (ref 22–32)
Calcium: 8.3 mg/dL — ABNORMAL LOW (ref 8.9–10.3)
Chloride: 114 mmol/L — ABNORMAL HIGH (ref 98–111)
Creatinine, Ser: 2.38 mg/dL — ABNORMAL HIGH (ref 0.44–1.00)
GFR, Estimated: 24 mL/min — ABNORMAL LOW (ref >60)
Glucose, Bld: 107 mg/dL — ABNORMAL HIGH (ref 70–99)
Potassium: 4.5 mmol/L (ref 3.5–5.1)
Sodium: 138 mmol/L (ref 135–145)

## 2022-06-24 LAB — CBC
HCT: 27.5 % — ABNORMAL LOW (ref 36.0–46.0)
Hemoglobin: 8.8 g/dL — ABNORMAL LOW (ref 12.0–15.0)
MCH: 30.1 pg (ref 26.0–34.0)
MCHC: 32 g/dL (ref 30.0–36.0)
MCV: 94.2 fL (ref 80.0–100.0)
Platelets: 270 10*3/uL (ref 150–400)
RBC: 2.92 MIL/uL — ABNORMAL LOW (ref 3.87–5.11)
RDW: 12.8 % (ref 11.5–15.5)
WBC: 9.6 10*3/uL (ref 4.0–10.5)
nRBC: 0 % (ref 0.0–0.2)

## 2022-06-24 LAB — PROCALCITONIN: Procalcitonin: 0.71 ng/mL

## 2022-06-24 MED ORDER — CEFUROXIME AXETIL 500 MG PO TABS: 500.0000 mg | ORAL_TABLET | Freq: Two times a day (BID) | ORAL | 0 refills | Status: AC

## 2022-06-24 MED ORDER — OXYBUTYNIN CHLORIDE 5 MG PO TABS: 5.0000 mg | ORAL_TABLET | Freq: Three times a day (TID) | ORAL | 0 refills | Status: DC | PRN

## 2022-06-24 NOTE — Discharge Summary (Signed)
Physician Discharge Summary   Patient: Sabrina Williams MRN: [id number] DOB: [birth year]  Admit date:     06/21/2022  Discharge date: 06/24/22  Discharge Physician: Annita Brod   PCP: Latanya Maudlin, NP   Recommendations at discharge:   Patient will follow-up with urology in 2 weeks New medication: Ceftin 500 mg p.o. twice daily x 10 days New medication: Ditropan 5 mg every 8 hours as needed for bladder spasms with pan-sensitivities  Discharge Diagnoses: Active Problems:   Anxiety and depression   Kidney stone   Benign essential hypertension   Chronic renal failure, stage 3b (HCC)   Class 3 severe obesity due to excess calories with body mass index (BMI) of 40.0 to 44.9 in adult [facility])  Principal Problem (Resolved):   Severe sepsis with acute organ dysfunction due to Enterococcus species [facility]) Resolved Problems:   Sepsis secondary to UTI [facility])  Hospital Course: She is 52 year old female with past medical history of morbid obesity, stage IV CKD and hypertension presented to the emergency room on 1/29 with complaints of left-sided flank pain and hypotension.  Workup revealed severe sepsis secondary to UTI from obstructing stone.  Patient started on IV fluids and antibiotics and urology consulted.  Patient taken that same day for left retrograde pyelogram with ureteral stent placement.  Since then, patient's labs have continued to improve with blood cultures growing out positive for E. coli with pan sensitivities  Assessment and Plan: E. coli severe sepsis secondary to UTI caused by obstructing ureteral kidney stone: Meets criteria with urinary source, hypotension, tachycardia, tachypnea and leukocytosis.  Status post ureteral stent placement.  Sensitivities from original urine culture drawn in urology office few days prior came back pansensitive for E. coli.  Patient changed from IV Rocephin to p.o. Ceftin 1 day for total of 14 days of therapy.  Patient will follow-up with  urology in 2 weeks.  If stone has not yet passed, stone extraction versus lithotripsy will be considered.  Discharged on Flomax and oxybutynin as needed   Hypertension: Blood pressure is stable   Stage IV CKD: At baseline.   Morbid obesity: Meets criteria with BMI greater than 40        Consultants: Urology Procedures performed: Ureteral stent placement Disposition: Home Diet recommendation:  Discharge Diet Orders (From admission, onward)     Start     Ordered   06/24/22 0000  Diet - low sodium heart healthy        06/24/22 1505           Low-sodium DISCHARGE MEDICATION: Allergies as of 06/24/2022       Reactions   Penicillins Hives, Rash        Medication List     TAKE these medications    Bariatric Multivitamins/Iron Caps Take 3 capsules by mouth daily before supper.   benazepril 20 MG tablet Commonly known as: LOTENSIN Take 20 mg by mouth daily.   calcitRIOL 0.5 MCG capsule Commonly known as: ROCALTROL Take 0.5 mcg by mouth daily.   calcium carbonate 1250 MG capsule Take 1,250 mg by mouth daily.   cefUROXime 500 MG tablet Commonly known as: CEFTIN Take 1 tablet (500 mg total) by mouth 2 (two) times daily with a meal for 10 days.   DULoxetine 60 MG capsule Commonly known as: CYMBALTA Take 60 mg by mouth daily.   ferrous sulfate 325 (65 FE) MG tablet Take 325 mg by mouth daily with breakfast.   Fingerstix Lancets Misc Use 1 each  2 (two) times daily As directed [Test blood sugars in rotating pattern:  fasting, before meals, 2 hours after a meal, at bedtime]   gabapentin 100 MG capsule Commonly known as: NEURONTIN Take 1 capsule (100 mg total) by mouth daily.   glucose blood test strip USE 1 STRIP TWICE A DAY AS DIRECTED   lovastatin 20 MG tablet Commonly known as: MEVACOR Take 20 mg by mouth every evening.   magnesium oxide 400 MG tablet Commonly known as: MAG-OX Take 1 tablet by mouth daily.   nortriptyline 75 MG capsule Commonly  known as: PAMELOR Take by mouth.   omeprazole 10 MG capsule Commonly known as: PRILOSEC Take 20 mg by mouth daily.   ondansetron 4 MG tablet Commonly known as: ZOFRAN TAKE 1 TABLET (4 MG TOTAL) BY MOUTH 2 (TWO) TIMES DAILY AS NEEDED FOR NAUSEA OR VOMITING.   oxybutynin 5 MG tablet Commonly known as: DITROPAN Take 1 tablet (5 mg total) by mouth every 8 (eight) hours as needed for bladder spasms.   Oxycodone HCl 10 MG Tabs Take 1 tablet (10 mg total) by mouth 4 (four) times daily as needed.   tamsulosin 0.4 MG Caps capsule Commonly known as: FLOMAX Take 1 capsule (0.4 mg total) by mouth daily.   tiZANidine 4 MG tablet Commonly known as: ZANAFLEX Take 1 tablet (4 mg total) by mouth 5 (five) times daily as needed for muscle spasms.   VITAMIN D3 PO Take 2,000 Units by mouth daily.        Follow-up Information     Hollice Espy, MD. Schedule an appointment as soon as possible for a visit in 2 week(s).   Specialty: Urology Why: Dr. Cherrie Gauze office will call you to schedule a followup.  Please give their office a call at [phone] if you haven't heard from them by Friday morning. Contact information: [place] [address] [phone]                Discharge Exam: Danley Danker Weights   06/21/22 1331 06/21/22 1646 06/21/22 1823  Weight: 109.8 kg 108.9 kg 117.5 kg   General: Alert and oriented x 3, no acute distress Cardiovascular: Regular rate and rhythm, S1-S2 Lungs: Clear to auscultation bilaterally  Condition at discharge: good  The results of significant diagnostics from this hospitalization (including imaging, microbiology, ancillary and laboratory) are listed below for reference.   Imaging Studies: DG OR UROLOGY CYSTO IMAGE ([facility] ONLY)  Result Date: 06/21/2022 There is no interpretation for this exam.  This order is for images obtained during a surgical procedure.  Please See "Surgeries" Tab for more information  regarding the procedure.   CT Renal Stone Study  Result Date: 06/21/2022 CLINICAL DATA:  Flank pain.  Low blood pressure. EXAM: CT ABDOMEN AND PELVIS WITHOUT CONTRAST TECHNIQUE: Multidetector CT imaging of the abdomen and pelvis was performed following the standard protocol without IV contrast. RADIATION DOSE REDUCTION: This exam was performed according to the departmental dose-optimization program which includes automated exposure control, adjustment of the mA and/or kV according to patient size and/or use of iterative reconstruction technique. COMPARISON:  CT 12/03/2021 and older. X-ray 06/18/2022. Renal ultrasound 12/03/2021 and older FINDINGS: Lower chest: Breathing motion at the lung bases. Mild left basilar scar or atelectasis. No pleural effusion. Small hiatal hernia. Hepatobiliary: On this non IV contrast exam, the liver is grossly preserved. Dependent stones along the nondilated gallbladder. Pancreas: Moderate pancreatic atrophy. No obvious mass on this noncontrast exam. Spleen: Spleen is nonenlarged.  Small hilar splenule. Adrenals/Urinary Tract: The adrenal glands are preserved. Lobular contour to the right kidney with atrophy. Punctate nonobstructing right-sided renal stones are seen. At least 2 stones are seen with the largest proximally 4 mm at the lower pole. No specific abnormal calcification along the course of the normal caliber right ureter. Preserved contours of the urinary bladder. There is moderate collecting system dilatation along the left kidney which is new. The previous 14 mm stone in the left kidney is now seen at the UPJ on coronal image 78. Increasing perinephric stranding on the left as well. No additional more distal ureteral stones. Stomach/Bowel: On this non oral contrast exam the large bowel demonstrates a redundant course to the sigmoid colon with scattered stool. Normal retrocecal appendix. 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 Please correlate with surgical history. Increasing central mesenteric haziness. No free air or fluid collection. Vascular/Lymphatic: Normal caliber aorta and IVC. Scattered atherosclerotic mild changes. 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 Unchanged from prior. There are some small retroperitoneal nodes identified on the left side. Although these are not pathologically are increased from previous. Example on series 3, image 35 has a node measuring now 11 by 9 mm which previously would have measured less than 5 mm. These could be reactive but has a differential recommend attention on follow-up. Reproductive: Uterus and bilateral adnexa are unremarkable. Other: Mild anasarca.  Small fat containing umbilical hernia. Musculoskeletal: Scattered degenerative changes along the spine. IMPRESSION: Move of the previous 14 mm intrarenal stone on the left note the UPJ with moderate collecting system dilatation and stranding. Obstructing stone. Nonobstructing right-sided renal stones. Few enlarged retroperitoneal nodes identified in the left side are new from prior. These could be reactive but recommend attention on follow-up. Gallstones. Surgical changes involving the stomach and duodenal. Slight increasing mesenteric haziness. Recommend attention on follow-up. Electronically Signed   By: Jill Side M.D.   On: 06/21/2022 15:43   CT HEAD WO CONTRAST (5MM)  Result Date: 06/21/2022 CLINICAL DATA:  Headache and neuro deficit. EXAM: CT HEAD WITHOUT CONTRAST TECHNIQUE: Contiguous axial images were obtained from the base of the skull through the vertex without intravenous contrast. RADIATION DOSE REDUCTION: This exam was performed according to the departmental dose-optimization program which includes automated exposure control, adjustment of the mA and/or kV according to patient size and/or use of iterative reconstruction  technique. COMPARISON:  None Available. FINDINGS: Brain: Mildly age advanced cerebral atrophy with resultant prominence of the extra-axial spaces adjacent the frontal lobes. No mass lesion, hemorrhage, hydrocephalus, acute infarct, intra-axial, or extra-axial fluid collection. Vascular: No hyperdense vessel or unexpected calcification. Skull: No skull fracture. Sinuses/Orbits: Normal imaged portions of the orbits and globes. Clear paranasal sinuses and mastoid air cells. Other: None. IMPRESSION: 1. No acute intracranial abnormality. 2. Mildly age advanced cerebral atrophy. Electronically Signed   By: Abigail Miyamoto M.D.   On: 06/21/2022 15:41   Abdomen 1 view (KUB)  Result Date: 06/20/2022 CLINICAL DATA:  History of kidney stones with left-sided flank pain for 2 days, initial encounter EXAM: ABDOMEN - 1 VIEW COMPARISON:  12/03/2021 FINDINGS: Scattered large and small bowel gas is noted. Fecal material is noted throughout the colon consistent with a degree of constipation. The kidneys are obscured by the overlying bowel content making evaluation for calculi difficult. The known bilateral renal calculi are not well appreciated. No bony abnormality is noted. IMPRESSION:  Changes of mild colonic constipation. Previously seen renal calculi are not well appreciated due to overlying bowel content. Electronically Signed   By: Inez Catalina M.D.   On: 06/20/2022 20:30    Microbiology: Results for orders placed or performed during the hospital encounter of 06/21/22  Resp panel by RT-PCR (RSV, Flu A&B, Covid) Anterior Nasal Swab     Status: None   Collection Time: 06/21/22  1:38 PM   Specimen: Anterior Nasal Swab  Result Value Ref Range Status   SARS Coronavirus 2 by RT PCR NEGATIVE NEGATIVE Final    Comment: (NOTE) SARS-CoV-2 target nucleic acids are NOT DETECTED.  The SARS-CoV-2 RNA is generally detectable in upper respiratory specimens during the acute phase of infection. The lowest concentration of SARS-CoV-2  viral copies this assay can detect is 138 copies/mL. A negative result does not preclude SARS-Cov-2 infection and should not be used as the sole basis for treatment or other patient management decisions. A negative result may occur with  improper specimen collection/handling, submission of specimen other than nasopharyngeal swab, presence of viral mutation(s) within the areas targeted by this assay, and inadequate number of viral copies(<138 copies/mL). A negative result must be combined with clinical observations, patient history, and epidemiological information. The expected result is Negative.  Fact Sheet for Patients:  EntrepreneurPulse.com.au  Fact Sheet for Healthcare Providers:  IncredibleEmployment.be  This test is no t yet approved or cleared by the [place] FDA and  has been authorized for detection and/or diagnosis of SARS-CoV-2 by FDA under an Emergency Use Authorization (EUA). This EUA will remain  in effect (meaning this test can be used) for the duration of the COVID-19 declaration under Section 564(b)(1) of the Act, 21 U.S.C.section 360bbb-3(b)(1), unless the authorization is terminated  or revoked sooner.       Influenza A by PCR NEGATIVE NEGATIVE Final   Influenza B by PCR NEGATIVE NEGATIVE Final    Comment: (NOTE) The Xpert Xpress SARS-CoV-2/FLU/RSV plus assay is intended as an aid in the diagnosis of influenza from Nasopharyngeal swab specimens and should not be used as a sole basis for treatment. Nasal washings and aspirates are unacceptable for Xpert Xpress SARS-CoV-2/FLU/RSV testing.  Fact Sheet for Patients: EntrepreneurPulse.com.au  Fact Sheet for Healthcare Providers: IncredibleEmployment.be  This test is not yet approved or cleared by the [place] FDA and has been authorized for detection and/or diagnosis of SARS-CoV-2 by FDA under an Emergency Use Authorization  (EUA). This EUA will remain in effect (meaning this test can be used) for the duration of the COVID-19 declaration under Section 564(b)(1) of the Act, 21 U.S.C. section 360bbb-3(b)(1), unless the authorization is terminated or revoked.     Resp Syncytial Virus by PCR NEGATIVE NEGATIVE Final    Comment: (NOTE) Fact Sheet for Patients: EntrepreneurPulse.com.au  Fact Sheet for Healthcare Providers: IncredibleEmployment.be  This test is not yet approved or cleared by the [place] FDA and has been authorized for detection and/or diagnosis of SARS-CoV-2 by FDA under an Emergency Use Authorization (EUA). This EUA will remain in effect (meaning this test can be used) for the duration of the COVID-19 declaration under Section 564(b)(1) of the Act, 21 U.S.C. section 360bbb-3(b)(1), unless the authorization is terminated or revoked.  Performed at [facility], [address] [place], [place] [address]   Blood culture (routine x 2)     Status: Abnormal (Preliminary result)   Collection Time: 06/21/22  4:14 PM   Specimen: BLOOD RIGHT ARM  Result Value Ref  Range Status   Specimen Description   Final    BLOOD RIGHT ARM Performed at [facility] [address] [place], [place] [address]    Special Requests   Final    BOTTLES DRAWN AEROBIC AND ANAEROBIC Blood Culture adequate volume Performed at [address] [place], [place] [address]    Culture  Setup Time   Final    GRAM NEGATIVE RODS AEROBIC BOTTLE ONLY CRITICAL RESULT CALLED TO, READ BACK BY AND VERIFIED WITH: PHARMD [email or web address].G AT 1020 06/22/2022 BY HNM AT AR.    Culture (A)  Final    ESCHERICHIA COLI SUSCEPTIBILITIES TO FOLLOW Performed at [facility], [place] [address], [place], [place] [address]    Report Status PENDING  Incomplete  Blood culture (routine x 2)     Status: None (Preliminary result)   Collection Time: 06/21/22  4:14 PM    Specimen: BLOOD  Result Value Ref Range Status   Specimen Description BLOOD BLOOD LEFT ARM  Final   Special Requests   Final    BOTTLES DRAWN AEROBIC AND ANAEROBIC Blood Culture results may not be optimal due to an inadequate volume of blood received in culture bottles   Culture   Final    NO GROWTH 3 DAYS Performed at [facility], [address] [place], [place] [address]    Report Status PENDING  Incomplete  Blood Culture ID Panel (Reflexed)     Status: Abnormal   Collection Time: 06/21/22  4:14 PM  Result Value Ref Range Status   Enterococcus faecalis NOT DETECTED NOT DETECTED Final   Enterococcus Faecium NOT DETECTED NOT DETECTED Final   Listeria monocytogenes NOT DETECTED NOT DETECTED Final   Staphylococcus species NOT DETECTED NOT DETECTED Final   Staphylococcus aureus (BCID) NOT DETECTED NOT DETECTED Final   Staphylococcus epidermidis NOT DETECTED NOT DETECTED Final   Staphylococcus lugdunensis NOT DETECTED NOT DETECTED Final   Streptococcus species NOT DETECTED NOT DETECTED Final   Streptococcus agalactiae NOT DETECTED NOT DETECTED Final   Streptococcus pneumoniae NOT DETECTED NOT DETECTED Final   Streptococcus pyogenes NOT DETECTED NOT DETECTED Final   A.calcoaceticus-baumannii NOT DETECTED NOT DETECTED Final   Bacteroides fragilis NOT DETECTED NOT DETECTED Final   Enterobacterales DETECTED (A) NOT DETECTED Final    Comment: Enterobacterales represent a large order of gram negative bacteria, not a single organism. CRITICAL RESULT CALLED TO, READ BACK BY AND VERIFIED WITH: MORGAN GOBBLE PHARMD 1020 06/22/22 HNM    Enterobacter cloacae complex NOT DETECTED NOT DETECTED Final   Escherichia coli DETECTED (A) NOT DETECTED Final    Comment: CRITICAL RESULT CALLED TO, READ BACK BY AND VERIFIED WITH: MORGAN GOBBLE PHARMD 1020 06/22/22 HNM    Klebsiella aerogenes NOT DETECTED NOT DETECTED Final   Klebsiella oxytoca NOT DETECTED NOT DETECTED Final   Klebsiella  pneumoniae NOT DETECTED NOT DETECTED Final   Proteus species NOT DETECTED NOT DETECTED Final   Salmonella species NOT DETECTED NOT DETECTED Final   Serratia marcescens NOT DETECTED NOT DETECTED Final   Haemophilus influenzae NOT DETECTED NOT DETECTED Final   Neisseria meningitidis NOT DETECTED NOT DETECTED Final   Pseudomonas aeruginosa NOT DETECTED NOT DETECTED Final   Stenotrophomonas maltophilia NOT DETECTED NOT DETECTED Final   Candida albicans NOT DETECTED NOT DETECTED Final   Candida auris NOT DETECTED NOT DETECTED Final   Candida glabrata NOT DETECTED NOT DETECTED Final   Candida krusei NOT DETECTED NOT DETECTED Final   Candida parapsilosis NOT  DETECTED NOT DETECTED Final   Candida tropicalis NOT DETECTED NOT DETECTED Final   Cryptococcus neoformans/gattii NOT DETECTED NOT DETECTED Final   CTX-M ESBL NOT DETECTED NOT DETECTED Final   Carbapenem resistance IMP NOT DETECTED NOT DETECTED Final   Carbapenem resistance KPC NOT DETECTED NOT DETECTED Final   Carbapenem resistance NDM NOT DETECTED NOT DETECTED Final   Carbapenem resist OXA 48 LIKE NOT DETECTED NOT DETECTED Final   Carbapenem resistance VIM NOT DETECTED NOT DETECTED Final    Comment: Performed at [facility], [address] [place], [place] [address]  MRSA Next Gen by PCR, Nasal     Status: None   Collection Time: 06/21/22  7:08 PM   Specimen: Nasal Mucosa; Nasal Swab  Result Value Ref Range Status   MRSA by PCR Next Gen NOT DETECTED NOT DETECTED Final    Comment: (NOTE) The GeneXpert MRSA Assay (FDA approved for NASAL specimens only), is one component of a comprehensive MRSA colonization surveillance program. It is not intended to diagnose MRSA infection nor to guide or monitor treatment for MRSA infections. Test performance is not FDA approved in patients less than 84 years old. Performed at [facility], [address] [place], [place] [address]   Culture, blood (Routine X 2) w Reflex to ID  Panel     Status: None (Preliminary result)   Collection Time: 06/22/22  1:49 PM   Specimen: BLOOD  Result Value Ref Range Status   Specimen Description BLOOD BLOOD RIGHT HAND  Final   Special Requests   Final    BOTTLES DRAWN AEROBIC AND ANAEROBIC Blood Culture adequate volume   Culture   Final    NO GROWTH 2 DAYS Performed at [facility], [address] [place], [place] [address]    Report Status PENDING  Incomplete  Culture, blood (Routine X 2) w Reflex to ID Panel     Status: None (Preliminary result)   Collection Time: 06/22/22  1:49 PM   Specimen: BLOOD  Result Value Ref Range Status   Specimen Description BLOOD BLOOD RIGHT WRIST  Final   Special Requests   Final    BOTTLES DRAWN AEROBIC AND ANAEROBIC Blood Culture adequate volume   Culture   Final    NO GROWTH 2 DAYS Performed at [facility], [address] [place], [place] [address]    Report Status PENDING  Incomplete    Labs: CBC: Recent Labs  Lab 06/21/22 1338 06/22/22 0222 06/23/22 0347 06/24/22 0451  WBC 16.1* 17.2* 13.2* 9.6  HGB 9.5* 9.7* 9.5* 8.8*  HCT 29.9* 30.1* 29.4* 27.5*  MCV 96.5 95.0 94.2 94.2  PLT 219 220 248 476   Basic Metabolic Panel: Recent Labs  Lab 06/21/22 1338 06/22/22 0222 06/23/22 0347 06/24/22 0451  NA 126* 131* 137 138  K 4.9 4.8 4.4 4.5  CL 98 107 112* 114*  CO2 20* 18* 20* 19*  GLUCOSE 135* 123* 97 107*  BUN 63* 58* 46* 41*  CREATININE 4.07* 3.58* 2.78* 2.38*  CALCIUM 8.4* 7.9* 8.3* 8.3*  MG  --  2.1 1.8  --   PHOS  --  4.9* 4.0  --    Liver Function Tests: Recent Labs  Lab 06/21/22 1338 06/22/22 0222 06/23/22 0347  AST 20 16 11*  ALT 18 18 16   ALKPHOS 120 126 119  BILITOT 0.7 0.7 0.6  PROT 5.5* 5.2* 5.1*  ALBUMIN 2.3* 2.3* 2.0*   CBG: Recent Labs  Lab 06/21/22 1646 06/21/22 1732  GLUCAP 92 99    Discharge time spent: less than 30 minutes.  Signed: Annita Brod, MD [facility] 06/24/2022

## 2022-06-24 NOTE — Progress Notes (Signed)
Discharge instructions reviewed with patient including followup visits and new medications.  Understanding was verbalized and all questions were answered.  IV removed without complication; patient tolerated well.  Patient discharged home via wheelchair in stable condition escorted by nursing staff.

## 2022-06-25 ENCOUNTER — Telehealth: Payer: Self-pay

## 2022-06-25 LAB — CULTURE, BLOOD (ROUTINE X 2): Special Requests: ADEQUATE

## 2022-06-25 NOTE — Progress Notes (Signed)
   [facility]  Surgery Date: Date: 07/06/2022  Surgeon: Dr. 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

## 2022-06-25 NOTE — Telephone Encounter (Signed)
I spoke with Mrs. Shaw. We have discussed possible surgery dates and Tuesday February 13th, 2024 was agreed upon by all parties. Patient given information about surgery date, what to expect pre-operatively and post operatively.  We discussed that a Pre-Admission Testing office will be calling to set up the pre-op visit that will take place prior to surgery, and that these appointments are typically done over the phone with a Pre-Admissions RN. Informed patient that our office will communicate any additional care to be provided after surgery. Patients questions or concerns were discussed during our call. Advised to call our office should there be any additional information, questions or concerns that arise. Patient verbalized understanding.

## 2022-06-26 LAB — CULTURE, BLOOD (ROUTINE X 2): Culture: NO GROWTH

## 2022-06-27 LAB — CULTURE, BLOOD (ROUTINE X 2)
Culture: NO GROWTH
Culture: NO GROWTH
Special Requests: ADEQUATE
Special Requests: ADEQUATE

## 2022-06-30 ENCOUNTER — Inpatient Hospital Stay
Admission: RE | Admit: 2022-06-30 | Discharge: 2022-06-30 | Disposition: A | Discharge status: Home / Self Care | Payer: 59 | Source: Ambulatory Visit

## 2022-06-30 HISTORY — DX: Pure hypercholesterolemia, unspecified: E78.00

## 2022-06-30 HISTORY — DX: Cardiac murmur, unspecified: R01.1

## 2022-06-30 HISTORY — DX: Gastro-esophageal reflux disease without esophagitis: K21.9

## 2022-06-30 HISTORY — DX: Nonscarring hair loss, unspecified: L65.9

## 2022-06-30 HISTORY — DX: Radiculopathy, cervical region: M54.12

## 2022-06-30 HISTORY — DX: Chronic kidney disease, stage 4 (severe): N18.4

## 2022-06-30 HISTORY — DX: Type 2 diabetes mellitus without complications: E11.9

## 2022-06-30 HISTORY — DX: Plantar fascial fibromatosis: M72.2

## 2022-06-30 HISTORY — DX: Morbid (severe) obesity due to excess calories: E66.01

## 2022-06-30 HISTORY — DX: Vitamin D deficiency, unspecified: E55.9

## 2022-06-30 HISTORY — DX: Fatty (change of) liver, not elsewhere classified: K76.0

## 2022-06-30 HISTORY — DX: Essential (primary) hypertension: I10

## 2022-06-30 HISTORY — DX: Anemia, unspecified: D64.9

## 2022-06-30 HISTORY — DX: Personal history of urinary calculi: Z87.442

## 2022-06-30 NOTE — Patient Instructions (Addendum)
Your procedure is scheduled on: Tuesday, February 13 Report to the Registration Desk on the 1st floor of the [facility]. To find out your arrival time, please call [phone] between 1PM - 3PM on: Monday, February 12 If your arrival time is 6:00 am, do not arrive before that time as the [place] entrance doors do not open until 6:00 am.  REMEMBER: Instructions that are not followed completely may result in serious medical risk, up to and including death; or upon the discretion of your surgeon and anesthesiologist your surgery may need to be rescheduled.  Do not eat or drink after midnight the night before surgery.  No gum chewing or hard candies.  One week prior to surgery: starting February 7 Stop Anti-inflammatories (NSAIDS) such as Advil, Aleve, Ibuprofen, Motrin, Naproxen, Naprosyn and Aspirin based products such as Excedrin, Goody's Powder, BC Powder. Stop ANY OVER THE COUNTER supplements until after surgery.  You may however, continue to take Tylenol if needed for pain up until the day of surgery.  Continue taking all prescribed medications.  TAKE THESE MEDICATIONS THE MORNING OF SURGERY WITH A SIP OF WATER:  Duloxetine (Cymbalta) Gabapentin Omeprazole (Prilosec) - (take one the night before and one on the morning of surgery - helps to prevent nausea after surgery.) Tamsulosin (Flomax)  No Alcohol for 24 hours before or after surgery.  No Smoking including e-cigarettes for 24 hours before surgery.  No chewable tobacco products for at least 6 hours before surgery.  No nicotine patches on the day of surgery.  Do not use any "recreational" drugs for at least a week (preferably 2 weeks) before your surgery.  Please be advised that the combination of cocaine and anesthesia may have negative outcomes, up to and including death. If you test positive for cocaine, your surgery will be cancelled.  On the morning of surgery brush your teeth with toothpaste and water, you may  rinse your mouth with mouthwash if you wish. Do not swallow any toothpaste or mouthwash.  Do not wear jewelry, make-up, hairpins, clips or nail polish.  Do not wear lotions, powders, or perfumes.   Do not shave body hair from the neck down 48 hours before surgery.  Contact lenses, hearing aids and dentures may not be worn into surgery.  Do not bring valuables to the hospital. [facility] is not responsible for any missing/lost belongings or valuables.   Bring your C-PAP to the hospital in case you may have to spend the night.   Notify your doctor if there is any change in your medical condition (cold, fever, infection).  Wear comfortable clothing (specific to your surgery type) to the hospital.  After surgery, you can help prevent lung complications by doing breathing exercises.  Take deep breaths and cough every 1-2 hours. Your doctor may order a device called an Incentive Spirometer to help you take deep breaths.  If you are being discharged the day of surgery, you will not be allowed to drive home. You will need a responsible individual to drive you home and stay with you for 24 hours after surgery.   If you are taking public transportation, you will need to have a responsible individual with you.  Please call the [place] Dept. at [phone] if you have any questions about these instructions.  Surgery Visitation Policy:  Patients undergoing a surgery or procedure may have two family members or support persons with them as long as the person is not COVID-19 positive or experiencing its  symptoms.

## 2022-07-01 ENCOUNTER — Inpatient Hospital Stay
Admission: RE | Admit: 2022-07-01 | Discharge: 2022-07-01 | Disposition: A | Discharge status: Home / Self Care | Payer: 59 | Source: Ambulatory Visit

## 2022-07-01 NOTE — Progress Notes (Signed)
Multiple calls to patient and patient's husband yesterday and today to complete pre-op interview. Messages left of both phones. Dr. Bernardo Heater office notified today and after several attempts as well, they were able to speak with the patient informing that she needed to call the pre-admit testing. The patient was given the specific number for the pre-op nurse. Patient did not call and did not answer when called 3 times after that. Patient will need to arrive early the day of surgery to complete the required testing before surgery.

## 2022-07-05 MED ORDER — SODIUM CHLORIDE 0.9 % IV SOLN: INTRAVENOUS | Status: DC

## 2022-07-05 MED ORDER — CHLORHEXIDINE GLUCONATE 0.12 % MT SOLN: 15.0000 mL | Freq: Once | OROMUCOSAL | Status: AC

## 2022-07-05 MED ORDER — CEFAZOLIN SODIUM-DEXTROSE 2-4 GM/100ML-% IV SOLN
2.0000 g | INTRAVENOUS | Status: AC
  Administered 2022-07-06: 2 g via INTRAVENOUS

## 2022-07-05 MED ORDER — ORAL CARE MOUTH RINSE: 15.0000 mL | Freq: Once | OROMUCOSAL | Status: AC

## 2022-07-05 NOTE — Anesthesia Postprocedure Evaluation (Signed)
Anesthesia Post Note  Patient: Sabrina Williams  Procedure(s) Performed: CYSTOSCOPY WITH RETROGRADE PYELOGRAM, URETEROSCOPY AND STENT PLACEMENT (Left)  Patient location during evaluation: PACU Anesthesia Type: General Level of consciousness: awake and alert Pain management: pain level controlled Vital Signs Assessment: post-procedure vital signs reviewed and stable Respiratory status: spontaneous breathing, nonlabored ventilation, respiratory function stable and patient connected to nasal cannula oxygen Cardiovascular status: blood pressure returned to baseline and stable Postop Assessment: no apparent nausea or vomiting Anesthetic complications: no   No notable events documented.   Last Vitals:  Vitals:   06/24/22 0931 06/24/22 1619  BP: 119/61 130/82  Pulse: 85 73  Resp: 18 18  Temp: 36.7 C (!) 36.3 C  SpO2: 96% 99%    Last Pain:  Vitals:   06/24/22 0931  TempSrc: Oral  PainSc:                  Molli Barrows

## 2022-07-06 ENCOUNTER — Other Ambulatory Visit: Payer: Self-pay

## 2022-07-06 ENCOUNTER — Ambulatory Visit: Payer: 59

## 2022-07-06 ENCOUNTER — Ambulatory Visit
Admission: RE | Admit: 2022-07-06 | Discharge: 2022-07-06 | Disposition: A | Discharge status: Home / Self Care | Payer: 59 | Attending: Urology | Admitting: Urology

## 2022-07-06 ENCOUNTER — Encounter: Payer: Self-pay | Admitting: Urology

## 2022-07-06 ENCOUNTER — Ambulatory Visit: Payer: 59 | Admitting: Urgent Care

## 2022-07-06 ENCOUNTER — Encounter
Admission: RE | Disposition: A | Discharge status: Home / Self Care | Payer: Self-pay | Source: Home / Self Care | Attending: Urology

## 2022-07-06 DIAGNOSIS — E1122 Type 2 diabetes mellitus with diabetic chronic kidney disease: Secondary | ICD-10-CM | POA: Insufficient documentation

## 2022-07-06 DIAGNOSIS — I129 Hypertensive chronic kidney disease with stage 1 through stage 4 chronic kidney disease, or unspecified chronic kidney disease: Secondary | ICD-10-CM | POA: Insufficient documentation

## 2022-07-06 DIAGNOSIS — I10 Essential (primary) hypertension: Secondary | ICD-10-CM

## 2022-07-06 DIAGNOSIS — N189 Chronic kidney disease, unspecified: Secondary | ICD-10-CM | POA: Insufficient documentation

## 2022-07-06 DIAGNOSIS — N201 Calculus of ureter: Secondary | ICD-10-CM

## 2022-07-06 DIAGNOSIS — Z01812 Encounter for preprocedural laboratory examination: Secondary | ICD-10-CM

## 2022-07-06 DIAGNOSIS — N200 Calculus of kidney: Secondary | ICD-10-CM | POA: Diagnosis present

## 2022-07-06 HISTORY — PX: CYSTOSCOPY/URETEROSCOPY/HOLMIUM LASER/STENT PLACEMENT: SHX6546

## 2022-07-06 LAB — POCT I-STAT, CHEM 8
BUN: 25 mg/dL — ABNORMAL HIGH (ref 6–20)
Calcium, Ion: 1.12 mmol/L — ABNORMAL LOW (ref 1.15–1.40)
Chloride: 102 mmol/L (ref 98–111)
Creatinine, Ser: 1.9 mg/dL — ABNORMAL HIGH (ref 0.44–1.00)
Glucose, Bld: 84 mg/dL (ref 70–99)
HCT: 38 % (ref 36.0–46.0)
Hemoglobin: 12.9 g/dL (ref 12.0–15.0)
Potassium: 4.1 mmol/L (ref 3.5–5.1)
Sodium: 139 mmol/L (ref 135–145)
TCO2: 27 mmol/L (ref 22–32)

## 2022-07-06 LAB — GLUCOSE, CAPILLARY
Glucose-Capillary: 88 mg/dL (ref 70–99)
Glucose-Capillary: 95 mg/dL (ref 70–99)
Glucose-Capillary: 82 mg/dL (ref 70–99)

## 2022-07-06 SURGERY — CYSTOSCOPY/URETEROSCOPY/HOLMIUM LASER/STENT PLACEMENT
Anesthesia: General | Site: Ureter | Laterality: Left

## 2022-07-06 MED ORDER — IOHEXOL 180 MG/ML  SOLN
INTRAMUSCULAR | Status: DC | PRN
  Administered 2022-07-06: 10 mL

## 2022-07-06 MED ORDER — LIDOCAINE HCL (CARDIAC) PF 100 MG/5ML IV SOSY
PREFILLED_SYRINGE | INTRAVENOUS | Status: DC | PRN
  Administered 2022-07-06: 100 mg via INTRAVENOUS

## 2022-07-06 MED ORDER — PROPOFOL 10 MG/ML IV BOLUS
INTRAVENOUS | Status: DC | PRN
  Administered 2022-07-06: 200 mg via INTRAVENOUS

## 2022-07-06 MED ORDER — OXYCODONE HCL 5 MG PO TABS
5.0000 mg | ORAL_TABLET | Freq: Once | ORAL | Status: AC | PRN
  Administered 2022-07-06: 5 mg via ORAL

## 2022-07-06 MED ORDER — SODIUM CHLORIDE 0.9 % IR SOLN
Status: DC | PRN
  Administered 2022-07-06: 3000 mL

## 2022-07-06 MED ORDER — DEXTROSE 50 % IV SOLN
INTRAVENOUS | Status: AC
  Administered 2022-07-06: 12.5 g via INTRAVENOUS
  Filled 2022-07-06: qty 50

## 2022-07-06 MED ORDER — OXYCODONE HCL 5 MG/5ML PO SOLN: 5.0000 mg | Freq: Once | ORAL | Status: AC | PRN

## 2022-07-06 MED ORDER — DEXMEDETOMIDINE HCL IN NACL 80 MCG/20ML IV SOLN
INTRAVENOUS | Status: DC | PRN
  Administered 2022-07-06: 4 ug via BUCCAL
  Administered 2022-07-06: 8 ug via BUCCAL
  Administered 2022-07-06: 4 ug via BUCCAL

## 2022-07-06 MED ORDER — FENTANYL CITRATE (PF) 100 MCG/2ML IJ SOLN
INTRAMUSCULAR | Status: AC
  Filled 2022-07-06: qty 2

## 2022-07-06 MED ORDER — ACETAMINOPHEN 10 MG/ML IV SOLN: 1000.0000 mg | Freq: Once | INTRAVENOUS | Status: DC | PRN

## 2022-07-06 MED ORDER — ONDANSETRON HCL 4 MG/2ML IJ SOLN: 4.0000 mg | Freq: Once | INTRAMUSCULAR | Status: DC | PRN

## 2022-07-06 MED ORDER — OXYCODONE HCL 10 MG PO TABS: 10.0000 mg | ORAL_TABLET | Freq: Four times a day (QID) | ORAL | 0 refills | Status: DC | PRN

## 2022-07-06 MED ORDER — SUGAMMADEX SODIUM 200 MG/2ML IV SOLN
INTRAVENOUS | Status: DC | PRN
  Administered 2022-07-06: 200 mg via INTRAVENOUS

## 2022-07-06 MED ORDER — ONDANSETRON HCL 4 MG/2ML IJ SOLN
INTRAMUSCULAR | Status: DC | PRN
  Administered 2022-07-06: 4 mg via INTRAVENOUS

## 2022-07-06 MED ORDER — LACTATED RINGERS IV SOLN: INTRAVENOUS | Status: DC | PRN

## 2022-07-06 MED ORDER — FENTANYL CITRATE (PF) 100 MCG/2ML IJ SOLN: 25.0000 ug | INTRAMUSCULAR | Status: DC | PRN

## 2022-07-06 MED ORDER — ROCURONIUM BROMIDE 100 MG/10ML IV SOLN
INTRAVENOUS | Status: DC | PRN
  Administered 2022-07-06: 40 mg via INTRAVENOUS

## 2022-07-06 MED ORDER — ACETAMINOPHEN 10 MG/ML IV SOLN
INTRAVENOUS | Status: DC | PRN
  Administered 2022-07-06: 1000 mg via INTRAVENOUS

## 2022-07-06 MED ORDER — FENTANYL CITRATE (PF) 100 MCG/2ML IJ SOLN
INTRAMUSCULAR | Status: DC | PRN
  Administered 2022-07-06 (×2): 50 ug via INTRAVENOUS

## 2022-07-06 MED ORDER — HYDROMORPHONE HCL 1 MG/ML IJ SOLN
INTRAMUSCULAR | Status: AC
  Administered 2022-07-06: 0.5 mg via INTRAVENOUS
  Filled 2022-07-06: qty 0.5

## 2022-07-06 MED ORDER — DEXMEDETOMIDINE HCL IN NACL 200 MCG/50ML IV SOLN: INTRAVENOUS | Status: DC | PRN

## 2022-07-06 MED ORDER — DEXAMETHASONE SODIUM PHOSPHATE 10 MG/ML IJ SOLN
INTRAMUSCULAR | Status: DC | PRN
  Administered 2022-07-06: 10 mg via INTRAVENOUS

## 2022-07-06 MED ORDER — OXYCODONE HCL 5 MG PO TABS
ORAL_TABLET | ORAL | Status: AC
  Filled 2022-07-06: qty 1

## 2022-07-06 MED ORDER — CHLORHEXIDINE GLUCONATE 0.12 % MT SOLN
OROMUCOSAL | Status: AC
  Administered 2022-07-06: 15 mL via OROMUCOSAL
  Filled 2022-07-06: qty 15

## 2022-07-06 MED ORDER — CEFAZOLIN SODIUM-DEXTROSE 2-4 GM/100ML-% IV SOLN
INTRAVENOUS | Status: AC
  Filled 2022-07-06: qty 100

## 2022-07-06 MED ORDER — DEXTROSE 50 % IV SOLN: 12.5000 g | Freq: Once | INTRAVENOUS | Status: AC

## 2022-07-06 MED ORDER — HYDROMORPHONE HCL 1 MG/ML IJ SOLN: 0.5000 mg | Freq: Once | INTRAMUSCULAR | Status: AC

## 2022-07-06 SURGICAL SUPPLY — 28 items
BAG DRAIN SIEMENS DORNER NS (MISCELLANEOUS) ×1 IMPLANT
BAG DRN NS LF (MISCELLANEOUS) ×1
BASKET ZERO TIP 1.9FR (BASKET) IMPLANT
BRUSH SCRUB EZ 1% IODOPHOR (MISCELLANEOUS) ×1 IMPLANT
BSKT STON RTRVL ZERO TP 1.9FR (BASKET)
CATH URET FLEX-TIP 2 LUMEN 10F (CATHETERS) IMPLANT
CATH URETL OPEN END 6X70 (CATHETERS) IMPLANT
CNTNR URN SCR LID CUP LEK RST (MISCELLANEOUS) IMPLANT
CONT SPEC 4OZ STRL OR WHT (MISCELLANEOUS)
DRAPE UTILITY 15X26 TOWEL STRL (DRAPES) ×1 IMPLANT
FIBER LASER MOSES 200 DFL (Laser) IMPLANT
GLOVE SURG UNDER POLY LF SZ7.5 (GLOVE) ×1 IMPLANT
GOWN STRL REUS W/ TWL LRG LVL3 (GOWN DISPOSABLE) ×1 IMPLANT
GOWN STRL REUS W/ TWL XL LVL3 (GOWN DISPOSABLE) ×1 IMPLANT
GOWN STRL REUS W/TWL LRG LVL3 (GOWN DISPOSABLE) ×1
GOWN STRL REUS W/TWL XL LVL3 (GOWN DISPOSABLE) ×1
GUIDEWIRE STR DUAL SENSOR (WIRE) ×1 IMPLANT
IV NS IRRIG 3000ML ARTHROMATIC (IV SOLUTION) ×1 IMPLANT
KIT TURNOVER CYSTO (KITS) ×1 IMPLANT
PACK CYSTO AR (MISCELLANEOUS) ×1 IMPLANT
SET CYSTO W/LG BORE CLAMP LF (SET/KITS/TRAYS/PACK) ×1 IMPLANT
SHEATH NAVIGATOR HD 12/14X36 (SHEATH) IMPLANT
STENT URET 6FRX24 CONTOUR (STENTS) IMPLANT
STENT URET 6FRX26 CONTOUR (STENTS) IMPLANT
SURGILUBE 2OZ TUBE FLIPTOP (MISCELLANEOUS) ×1 IMPLANT
TRAP FLUID SMOKE EVACUATOR (MISCELLANEOUS) ×1 IMPLANT
VALVE UROSEAL ADJ ENDO (VALVE) IMPLANT
WATER STERILE IRR 500ML POUR (IV SOLUTION) ×1 IMPLANT

## 2022-07-06 NOTE — Anesthesia Postprocedure Evaluation (Signed)
Anesthesia Post Note  Patient: Sabrina Williams  Procedure(s) Performed: CYSTOSCOPY/URETEROSCOPY/HOLMIUM LASER/STENT EXCHANGE (Left: Ureter)  Patient location during evaluation: PACU Anesthesia Type: General Level of consciousness: awake and alert Pain management: pain level controlled Vital Signs Assessment: post-procedure vital signs reviewed and stable Respiratory status: spontaneous breathing, nonlabored ventilation and respiratory function stable Cardiovascular status: blood pressure returned to baseline and stable Postop Assessment: no apparent nausea or vomiting Anesthetic complications: no   No notable events documented.   Last Vitals:  Vitals:   07/06/22 1700 07/06/22 1708  BP: 137/81 (!) 152/90  Pulse: 80 76  Resp: 13 18  Temp: 36.7 C 36.6 C  SpO2: 98% 97%    Last Pain:  Vitals:   07/06/22 1708  TempSrc: Temporal  PainSc: 0-No pain                 [place] Ouch

## 2022-07-06 NOTE — Transfer of Care (Signed)
Immediate Anesthesia Transfer of Care Note  Patient: Sabrina Williams  Procedure(s) Performed: CYSTOSCOPY/URETEROSCOPY/HOLMIUM LASER/STENT EXCHANGE (Left: Ureter)  Patient Location: PACU  Anesthesia Type:General  Level of Consciousness: awake  Airway & Oxygen Therapy: Patient Spontanous Breathing and Patient connected to face mask oxygen  Post-op Assessment: Report given to RN and Post -op Vital signs reviewed and stable  Post vital signs: Reviewed  Last Vitals:  Vitals Value Taken Time  BP 141/89   Temp 98.54F   Pulse 98   Resp 15   SpO2 99     Last Pain:  Vitals:   07/06/22 1234  TempSrc: Temporal  PainSc: 3          Complications: No notable events documented.

## 2022-07-06 NOTE — Interval H&P Note (Signed)
History and Physical Interval Note:  Patient underwent emergent placement left ureteral stent 06/21/2022 for an obstructing 14 mm left UPJ stone with sepsis from urinary source.  She presents today for definitive stone management.  The procedure has been discussed in detail.  CV: RRR Lungs: Clear  07/06/2022 3:14 PM  Sabrina Williams  has presented today for surgery, with the diagnosis of Left Ureteropelvic Junction Stone.  The various methods of treatment have been discussed with the patient and family. After consideration of risks, benefits and other options for treatment, the patient has consented to  Procedure(s): CYSTOSCOPY/URETEROSCOPY/HOLMIUM LASER/STENT EXCHANGE (Left) as a surgical intervention.  The patient's history has been reviewed, patient examined, no change in status, stable for surgery.  I have reviewed the patient's chart and labs.  Questions were answered to the patient's satisfaction.     [place]

## 2022-07-06 NOTE — Anesthesia Preprocedure Evaluation (Signed)
Anesthesia Evaluation  Patient identified by MRN, date of birth, ID band Patient awake    Reviewed: Allergy & Precautions, NPO status , Patient's Chart, lab work & pertinent test results  History of Anesthesia Complications Negative for: history of anesthetic complications  Airway Mallampati: II  TM Distance: <3 FB Neck ROM: Full    Dental  (+) Teeth Intact   Pulmonary neg pulmonary ROS, neg sleep apnea, neg COPD, Patient abstained from smoking.Not current smoker   Pulmonary exam normal breath sounds clear to auscultation       Cardiovascular Exercise Tolerance: Good METShypertension, (-) CAD and (-) Past MI (-) dysrhythmias  Rhythm:Regular Rate:Normal - Systolic murmurs    Neuro/Psych  PSYCHIATRIC DISORDERS Anxiety Depression    negative neurological ROS     GI/Hepatic ,GERD  Controlled and Medicated,,(+)     (-) substance abuse    Endo/Other  diabetes    Renal/GU Renal disease     Musculoskeletal   Abdominal  (+) + obese  Peds  Hematology   Anesthesia Other Findings Past Medical History: No date: Alopecia No date: Anemia No date: Anxiety No date: Cervical radiculopathy at C6 No date: Chronic kidney disease     Comment:  stones No date: Chronic pain No date: Complex regional pain syndrome I     Comment:  right foot No date: Depression No date: Diabetes mellitus type 2, uncomplicated (HCC) No date: GERD (gastroesophageal reflux disease) No date: Heart murmur No date: History of kidney stones No date: Hypercholesterolemia No date: Hypertension, essential, benign No date: Kidney stone No date: Morbid obesity ([place]) No date: Neuromuscular disorder ([place]) No date: Plantar fasciitis No date: Stage 4 chronic kidney disease (HCC) No date: Steatosis of liver No date: Tremor No date: Vitamin D deficiency  Reproductive/Obstetrics                              Anesthesia  Physical Anesthesia Plan  ASA: 3  Anesthesia Plan: General   Post-op Pain Management: Ofirmev IV (intra-op)*   Induction: Intravenous  PONV Risk Score and Plan: 3 and Ondansetron, Dexamethasone and Midazolam  Airway Management Planned: LMA  Additional Equipment: None  Intra-op Plan:   Post-operative Plan: Extubation in OR  Informed Consent: I have reviewed the patients History and Physical, chart, labs and discussed the procedure including the risks, benefits and alternatives for the proposed anesthesia with the patient or authorized representative who has indicated his/her understanding and acceptance.     Dental advisory given  Plan Discussed with: CRNA and Surgeon  Anesthesia Plan Comments: (Discussed risks of anesthesia with patient, including PONV, sore throat, lip/dental/eye damage. Rare risks discussed as well, such as cardiorespiratory and neurological sequelae, and allergic reactions. Discussed the role of CRNA in patient's perioperative care. Patient understands.)         Anesthesia Quick Evaluation

## 2022-07-06 NOTE — Discharge Instructions (Addendum)
AMBULATORY SURGERY  DISCHARGE INSTRUCTIONS   The drugs that you were given will stay in your system until tomorrow so for the next 24 hours you should not:  Drive an automobile Make any legal decisions Drink any alcoholic beverage   You may resume regular meals tomorrow.  Today it is better to start with liquids and gradually work up to solid foods.  You may eat anything you prefer, but it is better to start with liquids, then soup and crackers, and gradually work up to solid foods.   Please notify your doctor immediately if you have any unusual bleeding, trouble breathing, redness and pain at the surgery site, drainage, fever, or pain not relieved by medication.    Your post-operative visit with Dr.                                       is: Date:                        Time:    Please call to schedule your post-operative visit.  Additional Instructions:  DISCHARGE INSTRUCTIONS FOR KIDNEY STONE/URETERAL STENT   MEDICATIONS:  1. Resume all your other meds from home.  2.  AZO (over-the-counter) can help with the burning/stinging when you urinate. 3.  Oxycodone is for moderate/severe pain, Rx was sent to your pharmacy.  ACTIVITY:  1. May resume regular activities in 24 hours. 2. No driving while on narcotic pain medications  3. Drink plenty of water  4. Continue to walk at home - you can still get blood clots when you are at home, so keep active, but don't over do it.  5. May return to work/school tomorrow or when you feel ready   SIGNS/SYMPTOMS TO CALL:  Common postoperative symptoms include urinary frequency, urgency, bladder spasm and blood in the urine  Please call us if you have a fever greater than 101.5, uncontrolled nausea/vomiting, uncontrolled pain, dizziness, unable to urinate, excessively bloody urine, chest pain, shortness of breath, leg swelling, leg pain, or any other concerns or questions.   You can reach [place] at [phone].   FOLLOW-UP:  1.  Our office  will contact you for a follow-up appointment next week to have your stent removed.

## 2022-07-06 NOTE — Op Note (Signed)
   Preoperative diagnosis:  Left nephrolithiasis   Postoperative diagnosis:  Left nephrolithiasis  Procedure:  Cystoscopy Left ureteroscopy and stone removal Ureteroscopic laser lithotripsy Left ureteral stent exchange (9F/24 cm)  Left retrograde pyelography with interpretation  Surgeon: Nicki Reaper C. Yasmen Cortner, M.D.  Anesthesia: General  Complications: None  Intraoperative findings: Cystoscopy-bladder mucosa normal in appearance without erythema, solid or papillary lesions.  No significant inflammatory changes secondary to indwelling stent were noted. Left ureteropyeloscopy-ureter normal in appearance without mucosal abnormalities, calculus or stricture.  The calculus was identified free-floating in a large renal pelvis.  All calyces were examined and no additional calculi noted Left retrograde pyelography post procedure showed no filling defects, stone fragments or contrast extravasation  EBL: Minimal  Specimens: None   Indication: SHALICIA CRAGHEAD is a 52 y.o. who underwent left ureteral stent placement 06/21/2022 for an obstructing 14 mm left UPJ calculus with sepsis from a urinary source.  She presents today for definitive stone management.  After reviewing the management options for treatment, the patient elected to proceed with the above surgical procedure(s). We have discussed the potential benefits and risks of the procedure, side effects of the proposed treatment, the likelihood of the patient achieving the goals of the procedure, and any potential problems that might occur during the procedure or recuperation. Informed consent has been obtained.  Description of procedure:  The patient was taken to the operating room and general anesthesia was induced.  The patient was placed in the dorsal lithotomy position, prepped and draped in the usual sterile fashion, and preoperative antibiotics were administered. A preoperative time-out was performed.   A 21 French cystoscope was  lubricated and passed per urethra.  Panendoscopy was performed with findings as described above.    The stent was grasped with endoscopic forceps and brought out through the urethral meatus.  A 0.038 Sensor wire was then placed through the stent and advanced into the renal pelvis without difficulty.  An 8 [place] single channel digital flexible ureteroscope was then passed per urethra.  The ureteroscope was easily advanced into the left UO alongside the guidewire and advanced into the renal pelvis with findings as described above.  The calculus was free-floating in a large renal pelvis and relocated to a midpole posterior calyx.  It was then dusted with a 200 m Moses holmium laser fiber at a setting of 0.3J/80 Hz.  During dusting larger fragments chipped off the calculus and once the stone was completely treated noncontact lithotripsy was then performed at a setting of 0.4J/60 Hz until there were no fragments visualized larger than the tip of the laser fiber.  Retrograde pyelogram was performed with findings as described above.  All calyces were examined under fluoroscopic guidance and no significantly sized stone fragments were identified.  A 9F/24 cm Contour ureteral stent was placed under fluoroscopic guidance.  The wire was then removed with an adequate stent curl noted in the renal pelvis as well as in the bladder.  The bladder was then emptied and the procedure ended.  The patient appeared to tolerate the procedure well and without complications.  After anesthetic reversal the patient was transported to the PACU in stable condition.    Plan: Office follow-up 7-10 days for stent removal with KUB prior  John Giovanni, MD

## 2022-07-07 ENCOUNTER — Encounter: Payer: Self-pay | Admitting: Urology

## 2022-07-09 ENCOUNTER — Ambulatory Visit: Payer: 59 | Admitting: Urology

## 2022-07-10 ENCOUNTER — Other Ambulatory Visit: Payer: Self-pay | Admitting: Urology

## 2022-07-12 ENCOUNTER — Telehealth: Payer: Self-pay | Admitting: Registered Nurse

## 2022-07-12 NOTE — Telephone Encounter (Signed)
Rescheduled appt for 2/20 due to provider sick. She will be out of medications 07/13/22. Next appt 07/28/22 Please send in refills.

## 2022-07-13 ENCOUNTER — Encounter: Payer: 59 | Admitting: Registered Nurse

## 2022-07-13 ENCOUNTER — Telehealth: Payer: Self-pay

## 2022-07-13 ENCOUNTER — Other Ambulatory Visit: Payer: Self-pay | Admitting: Registered Nurse

## 2022-07-13 MED ORDER — OXYCODONE HCL 10 MG PO TABS: 10.0000 mg | ORAL_TABLET | Freq: Four times a day (QID) | ORAL | 0 refills | Status: DC | PRN

## 2022-07-13 NOTE — Addendum Note (Signed)
Addended by: Charlett Blake on: 07/13/2022 04:31 PM   Modules accepted: Orders

## 2022-07-13 NOTE — Telephone Encounter (Signed)
LVM refills have been sent to pharmacy. Nothing further needed.

## 2022-07-13 NOTE — Telephone Encounter (Signed)
Zella Ball of out of the office this week. Please review.   Filled  Written  ID  Drug  QTY  Days  Prescriber  RX #  Dispenser  Refill  Daily Dose*  Pymt Type  PMP  07/09/2022 07/06/2022 1  Oxycodone Hcl (Ir) 10 Mg Tab 20.00 5 [place] Sto [id number] Nor ([id number]) 0/0 60.00 MME Comm Ins [place] 06/16/2022 03/22/2022 1  Gabapentin 100 Mg Capsule 30.00 30 Eu Tho [id number] Nor ([id number]) 3/3  Comm Ins [place] 06/15/2022 06/15/2022 1  Oxycodone Hcl (Ir) 10 Mg Tab 100.00 25 Eu Tho [id number] Nor ([id number]) 0/0 60.00 MME Comm Ins [place]  My appointment with you today was canceled by your office and the soonest we could reschedule was for 07-28-22. I need you to please call in a prescription of oxy for me as I will be out of them tomorrow. Due to a kidney stone, severe kidney infection and sepsis I was gossip hospitalized 3 weeks ago. I was talking the oxy 4 times a day. It has been a crazy few weeks.  Thank you Patty

## 2022-07-14 ENCOUNTER — Ambulatory Visit (INDEPENDENT_AMBULATORY_CARE_PROVIDER_SITE_OTHER): Payer: 59 | Admitting: Urology

## 2022-07-14 ENCOUNTER — Encounter: Payer: Self-pay | Admitting: Urology

## 2022-07-14 ENCOUNTER — Other Ambulatory Visit: Payer: Self-pay | Admitting: Gerontology

## 2022-07-14 VITALS — BP 135/84 | HR 98 | Ht 67.0 in | Wt 230.0 lb

## 2022-07-14 DIAGNOSIS — Z466 Encounter for fitting and adjustment of urinary device: Secondary | ICD-10-CM

## 2022-07-14 DIAGNOSIS — Z1231 Encounter for screening mammogram for malignant neoplasm of breast: Secondary | ICD-10-CM

## 2022-07-14 LAB — URINALYSIS, COMPLETE
Bilirubin, UA: NEGATIVE
Glucose, UA: NEGATIVE
Ketones, UA: NEGATIVE
Nitrite, UA: NEGATIVE
Specific Gravity, UA: 1.02 (ref 1.005–1.030)
Urobilinogen, Ur: 0.2 mg/dL (ref 0.2–1.0)
pH, UA: 5.5 (ref 5.0–7.5)

## 2022-07-14 LAB — MICROSCOPIC EXAMINATION
Epithelial Cells (non renal): >10 /hpf — AB (ref 0–10)
RBC, Urine: >30 /hpf — AB (ref 0–2)
WBC, UA: >30 /hpf — AB (ref 0–5)

## 2022-07-14 MED ORDER — CIPROFLOXACIN HCL 500 MG PO TABS
500.0000 mg | ORAL_TABLET | Freq: Once | ORAL | Status: AC
  Administered 2022-07-14: 500 mg via ORAL

## 2022-07-14 NOTE — Progress Notes (Signed)
   Indications: Patient is 52 y.o., who is s/p ureteroscopic removal of a 14 mm left UPJ calculus on 07/06/2022.  Urgent stent placement was performed 06/21/2022 secondary to an obstructing calculus with suspected sepsis from urinary source.  She had no postoperative problems and is having moderate stent symptoms.  The patient is presenting today for stent removal.  Procedure:  Flexible Cystoscopy with stent removal [id number])  Timeout was performed and the correct patient, procedure and participants were identified.    Description:  The patient was prepped and draped in the usual sterile fashion. Flexible cystosopy was performed.  The stent was visualized, grasped, and removed intact without difficulty. The patient tolerated the procedure well.  A single dose of oral antibiotics was given.  Complications:  None  Plan:  Instructed to call for fever/flank pain post stent removal We discussed a metabolic evaluation and she would like to think this over Follow-up office visit 2-3 months with KUB   John Giovanni, MD

## 2022-07-22 LAB — CULTURE, URINE COMPREHENSIVE

## 2022-07-27 ENCOUNTER — Ambulatory Visit
Admission: RE | Admit: 2022-07-27 | Discharge: 2022-07-27 | Disposition: A | Discharge status: Home / Self Care | Payer: 59 | Source: Ambulatory Visit | Attending: Gerontology | Admitting: Gerontology

## 2022-07-27 DIAGNOSIS — Z1231 Encounter for screening mammogram for malignant neoplasm of breast: Secondary | ICD-10-CM | POA: Insufficient documentation

## 2022-07-28 ENCOUNTER — Encounter: Payer: 59 | Attending: Physical Medicine & Rehabilitation | Admitting: Registered Nurse

## 2022-07-28 VITALS — BP 126/83 | HR 73 | Ht 67.0 in | Wt 229.0 lb

## 2022-07-28 DIAGNOSIS — Z79891 Long term (current) use of opiate analgesic: Secondary | ICD-10-CM | POA: Insufficient documentation

## 2022-07-28 DIAGNOSIS — Z5181 Encounter for therapeutic drug level monitoring: Secondary | ICD-10-CM | POA: Diagnosis not present

## 2022-07-28 DIAGNOSIS — M545 Low back pain, unspecified: Secondary | ICD-10-CM | POA: Insufficient documentation

## 2022-07-28 DIAGNOSIS — G8929 Other chronic pain: Secondary | ICD-10-CM | POA: Insufficient documentation

## 2022-07-28 DIAGNOSIS — M1711 Unilateral primary osteoarthritis, right knee: Secondary | ICD-10-CM | POA: Diagnosis present

## 2022-07-28 DIAGNOSIS — G894 Chronic pain syndrome: Secondary | ICD-10-CM | POA: Insufficient documentation

## 2022-07-28 DIAGNOSIS — G90521 Complex regional pain syndrome I of right lower limb: Secondary | ICD-10-CM | POA: Diagnosis present

## 2022-07-28 DIAGNOSIS — M792 Neuralgia and neuritis, unspecified: Secondary | ICD-10-CM | POA: Diagnosis present

## 2022-07-28 DIAGNOSIS — M25512 Pain in left shoulder: Secondary | ICD-10-CM | POA: Insufficient documentation

## 2022-07-28 DIAGNOSIS — M79672 Pain in left foot: Secondary | ICD-10-CM | POA: Insufficient documentation

## 2022-07-28 MED ORDER — OXYCODONE HCL 10 MG PO TABS: 10.0000 mg | ORAL_TABLET | Freq: Four times a day (QID) | ORAL | 0 refills | Status: DC | PRN

## 2022-07-28 NOTE — Progress Notes (Deleted)
Pain Inventory Average Pain 4 Pain Right Now 3 My pain is constant, sharp, burning, tingling, and aching  In the last 24 hours, has pain interfered with the following? General activity 6 Relation with others 5 Enjoyment of life 5 What TIME of day is your pain at its worst? evening Sleep (in general) Fair  Pain is worse with: walking, standing, and some activites Pain improves with: rest and heat/ice Relief from Meds: 7  Family History  Problem Relation Age of Onset   Cancer Mother        multiple myeloma   Diabetes Father    Kidney disease Father    Heart disease Father    Social History   Socioeconomic History   Marital status: Married    Spouse name: Not on file   Number of children: 2   Years of education: some college   Highest education level: Not on file  Occupational History   Occupation: homemaker  Tobacco Use   Smoking status: Never   Smokeless tobacco: Never  Vaping Use   Vaping Use: Never used  Substance and Sexual Activity   Alcohol use: No    Alcohol/week: 0.0 standard drinks of alcohol   Drug use: No   Sexual activity: Not on file  Other Topics Concern   Not on file  Social History Narrative   Lives at home with husband.   Right-handed.   No daily caffeine use.   Social Determinants of Health   Financial Resource Strain: Not on file  Food Insecurity: No Food Insecurity (06/21/2022)   Hunger Vital Sign    Worried About Running Out of Food in the Last Year: Never true    Ran Out of Food in the Last Year: Never true  Transportation Needs: No Transportation Needs (06/21/2022)   PRAPARE - Hydrologist (Medical): No    Lack of Transportation (Non-Medical): No  Physical Activity: Not on file  Stress: Not on file  Social Connections: Not on file   Past Surgical History:  Procedure Laterality Date   CERVICAL SPINE SURGERY  2005   C6-7 fusion   CESAREAN SECTION     x2   CYSTOSCOPY WITH RETROGRADE PYELOGRAM,  URETEROSCOPY AND STENT PLACEMENT Left 06/21/2022   Procedure: CYSTOSCOPY WITH RETROGRADE PYELOGRAM, URETEROSCOPY AND STENT PLACEMENT;  Surgeon: Hollice Espy, MD;  Location: [facility];  Service: Urology;  Laterality: Left;   CYSTOSCOPY/URETEROSCOPY/HOLMIUM LASER/STENT PLACEMENT Left 07/06/2022   Procedure: CYSTOSCOPY/URETEROSCOPY/HOLMIUM LASER/STENT EXCHANGE;  Surgeon: Abbie Sons, MD;  Location: [facility];  Service: Urology;  Laterality: Left;   dental implant  11/2013   ENDOSCOPIC PLANTAR FASCIOTOMY Right 12/2013   HAND SURGERY Right    KNEE ARTHROSCOPY  1985   LITHOTRIPSY     multiple   PARTIAL GASTRECTOMY  01/01/2016   PLANTAR FASCIECTOMY Right    SHOULDER SURGERY Left 10/2004   SMALL INTESTINE SURGERY     cervical laminectomy   utereroscopy  1995   Past Surgical History:  Procedure Laterality Date   CERVICAL SPINE SURGERY  2005   C6-7 fusion   CESAREAN SECTION     x2   CYSTOSCOPY WITH RETROGRADE PYELOGRAM, URETEROSCOPY AND STENT PLACEMENT Left 06/21/2022   Procedure: CYSTOSCOPY WITH RETROGRADE PYELOGRAM, URETEROSCOPY AND STENT PLACEMENT;  Surgeon: Hollice Espy, MD;  Location: [facility];  Service: Urology;  Laterality: Left;   CYSTOSCOPY/URETEROSCOPY/HOLMIUM LASER/STENT PLACEMENT Left 07/06/2022   Procedure: CYSTOSCOPY/URETEROSCOPY/HOLMIUM LASER/STENT EXCHANGE;  Surgeon: Abbie Sons, MD;  Location: [facility];  Service: Urology;  Laterality: Left;   dental implant  11/2013   ENDOSCOPIC PLANTAR FASCIOTOMY Right 12/2013   HAND SURGERY Right    KNEE ARTHROSCOPY  1985   LITHOTRIPSY     multiple   PARTIAL GASTRECTOMY  01/01/2016   PLANTAR FASCIECTOMY Right    SHOULDER SURGERY Left 10/2004   SMALL INTESTINE SURGERY     cervical laminectomy   utereroscopy  1995   Past Medical History:  Diagnosis Date   Alopecia    Anemia    Anxiety    Cervical radiculopathy at C6    Chronic kidney disease    stones   Chronic pain    Complex regional pain syndrome I    right foot    Depression    Diabetes mellitus type 2, uncomplicated (HCC)    GERD (gastroesophageal reflux disease)    Heart murmur    History of kidney stones    Hypercholesterolemia    Hypertension, essential, benign    Kidney stone    Morbid obesity (HCC)    Neuromuscular disorder (HCC)    Plantar fasciitis    Stage 4 chronic kidney disease (HCC)    Steatosis of liver    Tremor    Vitamin D deficiency    LMP 12/02/2019   Opioid Risk Score:   Fall Risk Score:  `1  Depression screen [facility] 2/9     05/18/2022    3:38 PM 03/22/2022    3:16 PM 01/19/2022    8:20 AM 12/03/2021    2:34 PM 10/22/2021   11:28 AM 09/23/2021   11:53 AM 08/25/2021   11:15 AM  Depression screen [facility] 2/9  Decreased Interest 0 0 0 1 0 1 0  Down, Depressed, Hopeless 0 0 0 1 0 1 0  PHQ - 2 Score 0 0 0 2 0 2 0

## 2022-07-28 NOTE — Progress Notes (Signed)
Subjective:    Patient ID: Sabrina Williams, female    DOB: [birth year], 52 y.o.   MRN: [id number]  HPI: Sabrina Williams is a 52 y.o. female who returns for follow up appointment for chronic pain and medication refill. She states her pain is located in her left shoulder, lower back pain and right knee pain. Also reports right foot pain with tingling and burning and left foot pain. She rates her pain 3. Her current exercise regime is walking and performing stretching exercises.  Sabrina Williams was admitted to [facility] on 06/21/2022- 06/24/2022 foe Severe Sepsis with Acute Organ Dysfunction due to Enterococcus. She underwent on 06/21/2022, CYSTOSCOPY WITH RETROGRADE PYELOGRAM, URETEROSCOPY AND STENT PLACEMENT .   She underwent: on 07/06/2022: CYSTOSCOPY/URETEROSCOPY/HOLMIUM LASER/STENT EXCHANGE    Sabrina Williams Morphine equivalent is 60.00 MME.   Last UDS was Performed on 07/28/2022, it was consistent.    Pain Inventory Average Pain 4 Pain Right Now 3 My pain is constant, sharp, burning, tingling, and aching  In the last 24 hours, has pain interfered with the following? General activity 6 Relation with others 5 Enjoyment of life 5 What TIME of day is your pain at its worst? evening Sleep (in general) Fair  Pain is worse with: walking, standing, and some activites Pain improves with: rest and heat/ice Relief from Meds: 7  Family History  Problem Relation Age of Onset   Cancer Mother        multiple myeloma   Diabetes Father    Kidney disease Father    Heart disease Father    Social History   Socioeconomic History   Marital status: Married    Spouse name: Not on file   Number of children: 2   Years of education: some college   Highest education level: Not on file  Occupational History   Occupation: homemaker  Tobacco Use   Smoking status: Never   Smokeless tobacco: Never  Vaping Use   Vaping Use: Never used  Substance and Sexual Activity   Alcohol use: No     Alcohol/week: 0.0 standard drinks of alcohol   Drug use: No   Sexual activity: Not on file  Other Topics Concern   Not on file  Social History Narrative   Lives at home with husband.   Right-handed.   No daily caffeine use.   Social Determinants of Health   Financial Resource Strain: Not on file  Food Insecurity: No Food Insecurity (06/21/2022)   Hunger Vital Sign    Worried About Running Out of Food in the Last Year: Never true    Ran Out of Food in the Last Year: Never true  Transportation Needs: No Transportation Needs (06/21/2022)   PRAPARE - Hydrologist (Medical): No    Lack of Transportation (Non-Medical): No  Physical Activity: Not on file  Stress: Not on file  Social Connections: Not on file   Past Surgical History:  Procedure Laterality Date   CERVICAL SPINE SURGERY  2005   C6-7 fusion   CESAREAN SECTION     x2   CYSTOSCOPY WITH RETROGRADE PYELOGRAM, URETEROSCOPY AND STENT PLACEMENT Left 06/21/2022   Procedure: CYSTOSCOPY WITH RETROGRADE PYELOGRAM, URETEROSCOPY AND STENT PLACEMENT;  Surgeon: Hollice Espy, MD;  Location: [facility];  Service: Urology;  Laterality: Left;   CYSTOSCOPY/URETEROSCOPY/HOLMIUM LASER/STENT PLACEMENT Left 07/06/2022   Procedure: CYSTOSCOPY/URETEROSCOPY/HOLMIUM LASER/STENT EXCHANGE;  Surgeon: Abbie Sons, MD;  Location: [facility];  Service: Urology;  Laterality: Left;  dental implant  11/2013   ENDOSCOPIC PLANTAR FASCIOTOMY Right 12/2013   HAND SURGERY Right    KNEE ARTHROSCOPY  1985   LITHOTRIPSY     multiple   PARTIAL GASTRECTOMY  01/01/2016   PLANTAR FASCIECTOMY Right    SHOULDER SURGERY Left 10/2004   SMALL INTESTINE SURGERY     cervical laminectomy   utereroscopy  1995   Past Surgical History:  Procedure Laterality Date   CERVICAL SPINE SURGERY  2005   C6-7 fusion   CESAREAN SECTION     x2   CYSTOSCOPY WITH RETROGRADE PYELOGRAM, URETEROSCOPY AND STENT PLACEMENT Left 06/21/2022   Procedure:  CYSTOSCOPY WITH RETROGRADE PYELOGRAM, URETEROSCOPY AND STENT PLACEMENT;  Surgeon: Hollice Espy, MD;  Location: [facility];  Service: Urology;  Laterality: Left;   CYSTOSCOPY/URETEROSCOPY/HOLMIUM LASER/STENT PLACEMENT Left 07/06/2022   Procedure: CYSTOSCOPY/URETEROSCOPY/HOLMIUM LASER/STENT EXCHANGE;  Surgeon: Abbie Sons, MD;  Location: [facility];  Service: Urology;  Laterality: Left;   dental implant  11/2013   ENDOSCOPIC PLANTAR FASCIOTOMY Right 12/2013   HAND SURGERY Right    KNEE ARTHROSCOPY  1985   LITHOTRIPSY     multiple   PARTIAL GASTRECTOMY  01/01/2016   PLANTAR FASCIECTOMY Right    SHOULDER SURGERY Left 10/2004   SMALL INTESTINE SURGERY     cervical laminectomy   utereroscopy  1995   Past Medical History:  Diagnosis Date   Alopecia    Anemia    Anxiety    Cervical radiculopathy at C6    Chronic kidney disease    stones   Chronic pain    Complex regional pain syndrome I    right foot   Depression    Diabetes mellitus type 2, uncomplicated (HCC)    GERD (gastroesophageal reflux disease)    Heart murmur    History of kidney stones    Hypercholesterolemia    Hypertension, essential, benign    Kidney stone    Morbid obesity (HCC)    Neuromuscular disorder (HCC)    Plantar fasciitis    Stage 4 chronic kidney disease (HCC)    Steatosis of liver    Tremor    Vitamin D deficiency    BP 126/83   Pulse 73   Ht '5\' 7"'$  (1.702 m)   Wt 229 lb (103.9 kg)   LMP 12/02/2019   SpO2 99%   BMI 35.87 kg/m   Opioid Risk Score:   Fall Risk Score:  `1  Depression screen [facility] 2/9     07/28/2022    3:15 PM 05/18/2022    3:38 PM 03/22/2022    3:16 PM 01/19/2022    8:20 AM 12/03/2021    2:34 PM 10/22/2021   11:28 AM 09/23/2021   11:53 AM  Depression screen PHQ 2/9  Decreased Interest 0 0 0 0 1 0 1  Down, Depressed, Hopeless 0 0 0 0 1 0 1  PHQ - 2 Score 0 0 0 0 2 0 2     Review of Systems  Musculoskeletal:        LT arm, RT, wrist, B/L feet       Objective:    Physical Exam Vitals and nursing note reviewed.  Constitutional:      Appearance: Normal appearance.  Cardiovascular:     Rate and Rhythm: Normal rate and regular rhythm.     Pulses: Normal pulses.     Heart sounds: Normal heart sounds.  Pulmonary:     Effort: Pulmonary effort is normal.     Breath  sounds: Normal breath sounds.  Musculoskeletal:     Cervical back: Normal range of motion and neck supple.     Comments: Normal Muscle Bulk and Muscle Testing Reveals:  Upper Extremities: Full ROM and Muscle Strength 5/5 Bilateral AC Joint Tenderness  Lower Extremities: Full ROM and Muscle Strength 5/5 Arises from Table with ease Narrow Based  Gait     Skin:    General: Skin is warm and dry.  Neurological:     Mental Status: She is alert and oriented to person, place, and time.  Psychiatric:        Mood and Affect: Mood normal.        Behavior: Behavior normal.         Assessment & Plan:  1.Complex regional pain syndrome right lower extremity postoperative. She's s/p post plantar fascial release. She continue with sensitivity to touch. She has diffuse numbness and tingling.Gabapentin being weaned slowly . 07/28/2022. Nucynta discontinued due to Urine Clearance. Continue slow weaning. Refilled: Oxycodone 10 mg 4 times a day as needed for pain #120, .We will continue the opioid monitoring program, this consists of regular clinic visits, examinations, urine drug screen, pill counts as well as use of [place] Controlled Substance Reporting system. A 12 month History has been reviewed on the [place] Controlled Substance Reporting System on 07/28/2022. 2.Myofascial pain syndrome: Continue current medication regime with Tizanidine and  ice, heat and exercise regime. 07/28/2022 3. Depression: Continue: Zoloft. PCP Following. 07/28/2022 4. Cervical Post Laminectomy: Continue to Monitor. 07/28/2022 5. Cervicalgia/Cervical  Radiculopathy/ Left shoulder, Left Arm and Left  Hand Pain  with tingling,  Continue current medication regimen. Continue to monitor. 07/28/2022 6. Poly Neuropathy: Continue current medication regimen and :Continue to Monitor. 07/28/2022 7. Left Shoulder Tendonitis: Chronic Left Shoulder Pain:  Ortho Following. Continue current medication regime and Continue  to Monitor. 07/28/2022 8. Right hand pain/ Post Surgical  Procedure: Trigger Finger Release: Dr. Jerilee Hoh Following.  No complaints Today. 07/28/2022. 9. Tremor: No complaints today. Gabapentin being weaned slowly. Continue to monitor.  03/06//2024 10. Right   Knee Pain: Orthopedics Following: Continue with HEP as Tolerated. Continue to Monitor. 03/06/ 2024 11. Chronic Bilateral  Feet Pain. Podiatry Following.Continue with HEP as tolerated. Continue current medication regimen. Continue to monitor. 07/28/2022  12. Left Greater Trochanter Bursitis: No complaints today. Continue to alternate Ice and Heat Therapy. Continue to Monitor. 07/28/2022  13. Right Lateral Epicondylitis: No complaints today. Ortho Following. Continue to Monitor.  07/28/2022    F/U in 1 month

## 2022-07-31 LAB — TOXASSURE SELECT,+ANTIDEPR,UR

## 2022-08-03 ENCOUNTER — Encounter: Payer: Self-pay | Admitting: Registered Nurse

## 2022-08-07 ENCOUNTER — Other Ambulatory Visit: Payer: Self-pay | Admitting: Registered Nurse

## 2022-08-10 ENCOUNTER — Encounter: Payer: 59 | Admitting: Registered Nurse

## 2022-08-11 ENCOUNTER — Telehealth: Payer: Self-pay | Admitting: *Deleted

## 2022-08-11 NOTE — Telephone Encounter (Signed)
Urine drug screen for this encounter is consistent for prescribed medication 

## 2022-08-27 ENCOUNTER — Ambulatory Visit: Payer: 59 | Admitting: Urology

## 2022-08-27 NOTE — Progress Notes (Deleted)
08/27/2022 8:39 AM   Sabrina Williams [birth year] [id number]  Referring provider: Luciana Axeoward, Brandilynn Taormina H, NP [address] [place] [id number]  Urological history: 1.     No chief complaint on file.   HPI: Sabrina Williams is a 52 y.o. *** who presents today for ****   PMH: Past Medical History:  Diagnosis Date   Alopecia    Anemia    Anxiety    Cervical radiculopathy at C6    Chronic kidney disease    stones   Chronic pain    Complex regional pain syndrome I    right foot   Depression    Diabetes mellitus type 2, uncomplicated (HCC)    GERD (gastroesophageal reflux disease)    Heart murmur    History of kidney stones    Hypercholesterolemia    Hypertension, essential, benign    Kidney stone    Morbid obesity (HCC)    Neuromuscular disorder (HCC)    Plantar fasciitis    Stage 4 chronic kidney disease (HCC)    Steatosis of liver    Tremor    Vitamin D deficiency     Surgical History: Past Surgical History:  Procedure Laterality Date   CERVICAL SPINE SURGERY  2005   C6-7 fusion   CESAREAN SECTION     x2   CYSTOSCOPY WITH RETROGRADE PYELOGRAM, URETEROSCOPY AND STENT PLACEMENT Left 06/21/2022   Procedure: CYSTOSCOPY WITH RETROGRADE PYELOGRAM, URETEROSCOPY AND STENT PLACEMENT;  Surgeon: Vanna ScotlandBrandon, Ashley, MD;  Location: [facility];  Service: Urology;  Laterality: Left;   CYSTOSCOPY/URETEROSCOPY/HOLMIUM LASER/STENT PLACEMENT Left 07/06/2022   Procedure: CYSTOSCOPY/URETEROSCOPY/HOLMIUM LASER/STENT EXCHANGE;  Surgeon: Riki AltesStoioff, Scott C, MD;  Location: [facility];  Service: Urology;  Laterality: Left;   dental implant  11/2013   ENDOSCOPIC PLANTAR FASCIOTOMY Right 12/2013   HAND SURGERY Right    KNEE ARTHROSCOPY  1985   LITHOTRIPSY     multiple   PARTIAL GASTRECTOMY  01/01/2016   PLANTAR FASCIECTOMY Right    SHOULDER SURGERY Left 10/2004   SMALL INTESTINE SURGERY     cervical laminectomy   utereroscopy  1995    Home Medications:  Allergies as of  08/27/2022       Reactions   Penicillins Hives, Rash        Medication List        Accurate as of August 27, 2022  8:39 AM. If you have any questions, ask your nurse or doctor.          Bariatric Multivitamins/Iron Caps Take 3 capsules by mouth daily before supper.   benazepril 20 MG tablet Commonly known as: LOTENSIN Take 20 mg by mouth daily.   calcitRIOL 0.5 MCG capsule Commonly known as: ROCALTROL Take 0.5 mcg by mouth daily.   calcium carbonate 1250 MG capsule Take 1,250 mg by mouth daily.   DULoxetine 60 MG capsule Commonly known as: CYMBALTA Take 60 mg by mouth daily.   ferrous sulfate 325 (65 FE) MG tablet Take 325 mg by mouth daily with breakfast.   Fingerstix Lancets Misc Use 1 each 2 (two) times daily As directed [Test blood sugars in rotating pattern:  fasting, before meals, 2 hours after a meal, at bedtime]   gabapentin 100 MG capsule Commonly known as: NEURONTIN TAKE 1 CAPSULE BY MOUTH EVERY DAY   glucose blood test strip USE 1 STRIP TWICE A DAY AS DIRECTED   lovastatin 20 MG tablet Commonly known as: MEVACOR Take 20 mg by mouth every evening.  magnesium oxide 400 MG tablet Commonly known as: MAG-OX Take 1 tablet by mouth daily.   nortriptyline 75 MG capsule Commonly known as: PAMELOR Take by mouth.   omeprazole 10 MG capsule Commonly known as: PRILOSEC Take 20 mg by mouth daily.   ondansetron 4 MG tablet Commonly known as: ZOFRAN TAKE 1 TABLET (4 MG TOTAL) BY MOUTH 2 (TWO) TIMES DAILY AS NEEDED FOR NAUSEA OR VOMITING.   oxybutynin 5 MG tablet Commonly known as: DITROPAN Take 1 tablet (5 mg total) by mouth every 8 (eight) hours as needed for bladder spasms.   Oxycodone HCl 10 MG Tabs Take 1 tablet (10 mg total) by mouth 4 (four) times daily as needed.   tamsulosin 0.4 MG Caps capsule Commonly known as: FLOMAX Take 1 capsule (0.4 mg total) by mouth daily.   tiZANidine 4 MG tablet Commonly known as: ZANAFLEX TAKE 1 TABLET (4  MG TOTAL) BY MOUTH 5 (FIVE) TIMES DAILY AS NEEDED FOR MUSCLE SPASMS.   VITAMIN D3 PO Take 2,000 Units by mouth daily.        Allergies:  Allergies  Allergen Reactions   Penicillins Hives and Rash    Family History: Family History  Problem Relation Age of Onset   Cancer Mother        multiple myeloma   Diabetes Father    Kidney disease Father    Heart disease Father     Social History:  reports that she has never smoked. She has never used smokeless tobacco. She reports that she does not drink alcohol and does not use drugs.  ROS: Pertinent ROS in HPI  Physical Exam: LMP 12/02/2019   Constitutional:  Well nourished. Alert and oriented, No acute distress. HEENT: [place] AT, moist mucus membranes.  Trachea midline, no masses. Cardiovascular: No clubbing, cyanosis, or edema. Respiratory: Normal respiratory effort, no increased work of breathing. GI: Abdomen is soft, non tender, non distended, no abdominal masses. Liver and spleen not palpable.  No hernias appreciated.  Stool sample for occult testing is not indicated.   GU: No CVA tenderness.  No bladder fullness or masses.  Patient with circumcised/uncircumcised phallus. ***Foreskin easily retracted***  Urethral meatus is patent.  No penile discharge. No penile lesions or rashes. Scrotum without lesions, cysts, rashes and/or edema.  Testicles are located scrotally bilaterally. No masses are appreciated in the testicles. Left and right epididymis are normal. Rectal: Patient with  normal sphincter tone. Anus and perineum without scarring or rashes. No rectal masses are appreciated. Prostate is approximately *** grams, *** nodules are appreciated. Seminal vesicles are normal. Skin: No rashes, bruises or suspicious lesions. Lymph: No cervical or inguinal adenopathy. Neurologic: Grossly intact, no focal deficits, moving all 4 extremities. Psychiatric: Normal mood and affect.  Laboratory Data: Lab Results  Component Value Date   WBC  9.6 06/24/2022   HGB 12.9 07/06/2022   HCT 38.0 07/06/2022   MCV 94.2 06/24/2022   PLT 270 06/24/2022    Lab Results  Component Value Date   CREATININE 1.90 (H) 07/06/2022    No results found for: "PSA"  No results found for: "TESTOSTERONE"  Lab Results  Component Value Date   HGBA1C 5.5 12/04/2021    Lab Results  Component Value Date   TSH 0.333 (L) 04/02/2019    No results found for: "CHOL", "HDL", "CHOLHDL", "VLDL", "LDLCALC"  Lab Results  Component Value Date   AST 11 (L) 06/23/2022   Lab Results  Component Value Date   ALT 16 06/23/2022  No components found for: "ALKALINEPHOPHATASE" No components found for: "BILIRUBINTOTAL"  No results found for: "ESTRADIOL"  Urinalysis    Component Value Date/Time   COLORURINE YELLOW (A) 06/22/2022 0120   APPEARANCEUR Hazy (A) 07/14/2022 1129   LABSPEC 1.008 06/22/2022 0120   PHURINE 6.0 06/22/2022 0120   GLUCOSEU Negative 07/14/2022 1129   HGBUR LARGE (A) 06/22/2022 0120   BILIRUBINUR Negative 07/14/2022 1129   KETONESUR NEGATIVE 06/22/2022 0120   PROTEINUR 2+ (A) 07/14/2022 1129   PROTEINUR 100 (A) 06/22/2022 0120   NITRITE Negative 07/14/2022 1129   NITRITE POSITIVE (A) 06/22/2022 0120   LEUKOCYTESUR 2+ (A) 07/14/2022 1129   LEUKOCYTESUR LARGE (A) 06/22/2022 0120    I have reviewed the labs.   Pertinent Imaging: @CT @ @ultrasound @ @KUB @ I have independently reviewed the films.    Assessment & Plan:  ***  There are no diagnoses linked to this encounter.  No follow-ups on file.  These notes generated with voice recognition software. I apologize for typographical errors.  Cloretta Ned  [facility] [address]  [address] [place], [place] [address] [phone]

## 2022-08-30 ENCOUNTER — Ambulatory Visit (INDEPENDENT_AMBULATORY_CARE_PROVIDER_SITE_OTHER): Payer: 59 | Admitting: Physician Assistant

## 2022-08-30 VITALS — BP 100/67 | HR 93 | Ht 67.0 in | Wt 230.0 lb

## 2022-08-30 DIAGNOSIS — R300 Dysuria: Secondary | ICD-10-CM | POA: Diagnosis not present

## 2022-08-30 DIAGNOSIS — R3915 Urgency of urination: Secondary | ICD-10-CM | POA: Diagnosis not present

## 2022-08-30 LAB — URINALYSIS, COMPLETE
Bilirubin, UA: NEGATIVE
Glucose, UA: NEGATIVE
Ketones, UA: NEGATIVE
Nitrite, UA: NEGATIVE
Specific Gravity, UA: 1.02 (ref 1.005–1.030)
Urobilinogen, Ur: 0.2 mg/dL (ref 0.2–1.0)
pH, UA: 5 (ref 5.0–7.5)

## 2022-08-30 LAB — MICROSCOPIC EXAMINATION: WBC, UA: >30 /hpf — AB (ref 0–5)

## 2022-08-30 MED ORDER — DOXYCYCLINE HYCLATE 100 MG PO CAPS
100.0000 mg | ORAL_CAPSULE | Freq: Two times a day (BID) | ORAL | 0 refills | Status: AC
Start: 2022-08-30 — End: 2022-09-06

## 2022-08-30 NOTE — Progress Notes (Signed)
08/30/2022 11:36 AM   Adam Phenix Tourangeau 1970/05/30 [id number]  CC: Chief Complaint  Patient presents with   Follow-up   HPI: BONNELL LERUM is a 52 y.o. female with PMH CKD 4, DM 2, and recurrent nephrolithiasis with a recent history of urosepsis secondary to an obstructing 14 mm left UPJ stone, s/p staged left ureteroscopy and subsequent cystoscopy stent removal with Dr. Lonna Cobb in February who presents today for evaluation of possible UTI.   Today she reports an approximate 5-day history of dysuria.  She also had about 1 day of obstructive sensations with voiding that felt similar to past episodes of a stone in her bladder/urethra.  This sensation has resolved, but she never saw stone pass.  She denies fever, chills, nausea, vomiting, and flank pain.  Additionally, she reports new urinary urgency and urge incontinence since her recent ureteroscopy.  In-office UA today positive for trace intact blood, 1+ protein, and 3+ leukocytes; urine microscopy with >30 WBCs/HPF, 3-10 RBCs/HPF, and moderate bacteria.   PMH: Past Medical History:  Diagnosis Date   Alopecia    Anemia    Anxiety    Cervical radiculopathy at C6    Chronic kidney disease    stones   Chronic pain    Complex regional pain syndrome I    right foot   Depression    Diabetes mellitus type 2, uncomplicated (HCC)    GERD (gastroesophageal reflux disease)    Heart murmur    History of kidney stones    Hypercholesterolemia    Hypertension, essential, benign    Kidney stone    Morbid obesity (HCC)    Neuromuscular disorder (HCC)    Plantar fasciitis    Stage 4 chronic kidney disease (HCC)    Steatosis of liver    Tremor    Vitamin D deficiency     Surgical History: Past Surgical History:  Procedure Laterality Date   CERVICAL SPINE SURGERY  2005   C6-7 fusion   CESAREAN SECTION     x2   CYSTOSCOPY WITH RETROGRADE PYELOGRAM, URETEROSCOPY AND STENT PLACEMENT Left 06/21/2022   Procedure: CYSTOSCOPY  WITH RETROGRADE PYELOGRAM, URETEROSCOPY AND STENT PLACEMENT;  Surgeon: Vanna Scotland, MD;  Location: [facility];  Service: Urology;  Laterality: Left;   CYSTOSCOPY/URETEROSCOPY/HOLMIUM LASER/STENT PLACEMENT Left 07/06/2022   Procedure: CYSTOSCOPY/URETEROSCOPY/HOLMIUM LASER/STENT EXCHANGE;  Surgeon: Riki Altes, MD;  Location: [facility];  Service: Urology;  Laterality: Left;   dental implant  11/2013   ENDOSCOPIC PLANTAR FASCIOTOMY Right 12/2013   HAND SURGERY Right    KNEE ARTHROSCOPY  1985   LITHOTRIPSY     multiple   PARTIAL GASTRECTOMY  01/01/2016   PLANTAR FASCIECTOMY Right    SHOULDER SURGERY Left 10/2004   SMALL INTESTINE SURGERY     cervical laminectomy   utereroscopy  1995    Home Medications:  Allergies as of 08/30/2022       Reactions   Penicillins Hives, Rash        Medication List        Accurate as of August 30, 2022 11:36 AM. If you have any questions, ask your nurse or doctor.          Bariatric Multivitamins/Iron Caps Take 3 capsules by mouth daily before supper.   benazepril 20 MG tablet Commonly known as: LOTENSIN Take 20 mg by mouth daily.   calcitRIOL 0.5 MCG capsule Commonly known as: ROCALTROL Take 0.5 mcg by mouth daily.   calcium carbonate 1250 MG capsule Take 1,250  mg by mouth daily.   DULoxetine 60 MG capsule Commonly known as: CYMBALTA Take 60 mg by mouth daily.   ferrous sulfate 325 (65 FE) MG tablet Take 325 mg by mouth daily with breakfast.   Fingerstix Lancets Misc Use 1 each 2 (two) times daily As directed [Test blood sugars in rotating pattern:  fasting, before meals, 2 hours after a meal, at bedtime]   gabapentin 100 MG capsule Commonly known as: NEURONTIN TAKE 1 CAPSULE BY MOUTH EVERY DAY   glucose blood test strip USE 1 STRIP TWICE A DAY AS DIRECTED   lovastatin 20 MG tablet Commonly known as: MEVACOR Take 20 mg by mouth every evening.   magnesium oxide 400 MG tablet Commonly known as: MAG-OX Take 1 tablet by  mouth daily.   nortriptyline 75 MG capsule Commonly known as: PAMELOR Take by mouth.   omeprazole 10 MG capsule Commonly known as: PRILOSEC Take 20 mg by mouth daily.   ondansetron 4 MG tablet Commonly known as: ZOFRAN TAKE 1 TABLET (4 MG TOTAL) BY MOUTH 2 (TWO) TIMES DAILY AS NEEDED FOR NAUSEA OR VOMITING.   oxybutynin 5 MG tablet Commonly known as: DITROPAN Take 1 tablet (5 mg total) by mouth every 8 (eight) hours as needed for bladder spasms.   Oxycodone HCl 10 MG Tabs Take 1 tablet (10 mg total) by mouth 4 (four) times daily as needed.   tamsulosin 0.4 MG Caps capsule Commonly known as: FLOMAX Take 1 capsule (0.4 mg total) by mouth daily.   tiZANidine 4 MG tablet Commonly known as: ZANAFLEX TAKE 1 TABLET (4 MG TOTAL) BY MOUTH 5 (FIVE) TIMES DAILY AS NEEDED FOR MUSCLE SPASMS.   VITAMIN D3 PO Take 2,000 Units by mouth daily.        Allergies:  Allergies  Allergen Reactions   Penicillins Hives and Rash    Family History: Family History  Problem Relation Age of Onset   Cancer Mother        multiple myeloma   Diabetes Father    Kidney disease Father    Heart disease Father     Social History:   reports that she has never smoked. She has never used smokeless tobacco. She reports that she does not drink alcohol and does not use drugs.  Physical Exam: BP 100/67   Pulse 93   Ht 5\' 7"  (1.702 m)   Wt 230 lb (104.3 kg)   LMP 12/02/2019   BMI 36.02 kg/m   Constitutional:  Alert and oriented, no acute distress, nontoxic appearing HEENT: [place], AT Cardiovascular: No clubbing, cyanosis, or edema Respiratory: Normal respiratory effort, no increased work of breathing Skin: No rashes, bruises or suspicious lesions Neurologic: Grossly intact, no focal deficits, moving all 4 extremities Psychiatric: Normal mood and affect  Laboratory Data: Results for orders placed or performed in visit on 08/30/22  Microscopic Examination   Urine  Result Value Ref Range   WBC,  UA >30 (A) 0 - 5 /hpf   RBC, Urine 3-10 (A) 0 - 2 /hpf   Epithelial Cells (non renal) 0-10 0 - 10 /hpf   Bacteria, UA Moderate (A) None seen/Few  Urinalysis, Complete  Result Value Ref Range   Specific Gravity, UA 1.020 1.005 - 1.030   pH, UA 5.0 5.0 - 7.5   Color, UA Yellow Yellow   Appearance Ur Hazy (A) Clear   Leukocytes,UA 3+ (A) Negative   Protein,UA 1+ (A) Negative/Trace   Glucose, UA Negative Negative   Ketones, UA Negative  Negative   RBC, UA Trace (A) Negative   Bilirubin, UA Negative Negative   Urobilinogen, Ur 0.2 0.2 - 1.0 mg/dL   Nitrite, UA Negative Negative   Microscopic Examination See below:    Assessment & Plan:   1. Dysuria UA appears grossly infected today, will start empiric doxycycline and send for culture for further evaluation.  Will plan for lab visit in 1 week to prove resolution of microscopic hematuria.  We discussed return precautions including flank pain, fever, chills, nausea, or vomiting. - Urinalysis, Complete - CULTURE, URINE COMPREHENSIVE - Urinalysis, Complete; Future - doxycycline (VIBRAMYCIN) 100 MG capsule; Take 1 capsule (100 mg total) by mouth 2 (two) times daily for 7 days.  Dispense: 14 capsule; Refill: 0  2. Urinary urgency I asked her to reach out to me via MyChart in about 2 weeks if she has had persistent urgency/urge incontinence after culture appropriate treatment for UTI.  At that point, may start OAB med trial.  Return in about 1 week (around 09/06/2022) for Lab visit for UA.  Carman ChingSamantha Wilbon Obenchain, PA-C  [facility] [place] [address], [address] [place], [place] [address] [phone]

## 2022-08-31 ENCOUNTER — Encounter: Payer: Self-pay | Admitting: Registered Nurse

## 2022-08-31 ENCOUNTER — Encounter: Payer: 59 | Attending: Physical Medicine & Rehabilitation | Admitting: Registered Nurse

## 2022-08-31 VITALS — BP 104/70 | HR 70 | Ht 67.0 in | Wt 232.8 lb

## 2022-08-31 DIAGNOSIS — M542 Cervicalgia: Secondary | ICD-10-CM | POA: Insufficient documentation

## 2022-08-31 DIAGNOSIS — G894 Chronic pain syndrome: Secondary | ICD-10-CM | POA: Insufficient documentation

## 2022-08-31 DIAGNOSIS — G90521 Complex regional pain syndrome I of right lower limb: Secondary | ICD-10-CM

## 2022-08-31 DIAGNOSIS — M7711 Lateral epicondylitis, right elbow: Secondary | ICD-10-CM | POA: Diagnosis present

## 2022-08-31 DIAGNOSIS — M79671 Pain in right foot: Secondary | ICD-10-CM | POA: Insufficient documentation

## 2022-08-31 DIAGNOSIS — M5412 Radiculopathy, cervical region: Secondary | ICD-10-CM | POA: Diagnosis present

## 2022-08-31 DIAGNOSIS — Z79891 Long term (current) use of opiate analgesic: Secondary | ICD-10-CM

## 2022-08-31 DIAGNOSIS — Z5181 Encounter for therapeutic drug level monitoring: Secondary | ICD-10-CM

## 2022-08-31 DIAGNOSIS — M79672 Pain in left foot: Secondary | ICD-10-CM | POA: Insufficient documentation

## 2022-08-31 DIAGNOSIS — M792 Neuralgia and neuritis, unspecified: Secondary | ICD-10-CM | POA: Insufficient documentation

## 2022-08-31 DIAGNOSIS — M1711 Unilateral primary osteoarthritis, right knee: Secondary | ICD-10-CM | POA: Insufficient documentation

## 2022-08-31 DIAGNOSIS — G8929 Other chronic pain: Secondary | ICD-10-CM | POA: Diagnosis present

## 2022-08-31 MED ORDER — OXYCODONE HCL 10 MG PO TABS: 10.0000 mg | ORAL_TABLET | Freq: Four times a day (QID) | ORAL | 0 refills | Status: DC | PRN

## 2022-08-31 NOTE — Progress Notes (Unsigned)
Subjective:    Patient ID: Sabrina Williams, female    DOB: 14-Nov-1970, 52 y.o.   MRN: [id number]  HPI: Sabrina Williams is a 52 y.o. female who returns for follow up appointment for chronic pain and medication refill. states *** pain is located in  ***. rates pain ***. current exercise regime is walking and performing stretching exercises.  [name] Morphine equivalent is *** MME.   Last UDS was Performed on 07/28/2022, it was consistent.      Pain Inventory Average Pain 5 Pain Right Now 3 My pain is constant, sharp, burning, dull, stabbing, tingling, and aching  In the last 24 hours, has pain interfered with the following? General activity 7 Relation with others 6 Enjoyment of life 7 What TIME of day is your pain at its worst? evening Sleep (in general) Fair  Pain is worse with: walking, standing, and some activites Pain improves with: rest, therapy/exercise, and medication Relief from Meds: 7  Family History  Problem Relation Age of Onset   Cancer Mother        multiple myeloma   Diabetes Father    Kidney disease Father    Heart disease Father    Social History   Socioeconomic History   Marital status: Married    Spouse name: Not on file   Number of children: 2   Years of education: some college   Highest education level: Not on file  Occupational History   Occupation: homemaker  Tobacco Use   Smoking status: Never   Smokeless tobacco: Never  Vaping Use   Vaping Use: Never used  Substance and Sexual Activity   Alcohol use: No    Alcohol/week: 0.0 standard drinks of alcohol   Drug use: No   Sexual activity: Not on file  Other Topics Concern   Not on file  Social History Narrative   Lives at home with husband.   Right-handed.   No daily caffeine use.   Social Determinants of Health   Financial Resource Strain: Not on file  Food Insecurity: No Food Insecurity (06/21/2022)   Hunger Vital Sign    Worried About Running Out of Food in the Last Year:  Never true    Ran Out of Food in the Last Year: Never true  Transportation Needs: No Transportation Needs (06/21/2022)   PRAPARE - Administrator, Civil Service (Medical): No    Lack of Transportation (Non-Medical): No  Physical Activity: Not on file  Stress: Not on file  Social Connections: Not on file   Past Surgical History:  Procedure Laterality Date   CERVICAL SPINE SURGERY  2005   C6-7 fusion   CESAREAN SECTION     x2   CYSTOSCOPY WITH RETROGRADE PYELOGRAM, URETEROSCOPY AND STENT PLACEMENT Left 06/21/2022   Procedure: CYSTOSCOPY WITH RETROGRADE PYELOGRAM, URETEROSCOPY AND STENT PLACEMENT;  Surgeon: Vanna Scotland, MD;  Location: [facility];  Service: Urology;  Laterality: Left;   CYSTOSCOPY/URETEROSCOPY/HOLMIUM LASER/STENT PLACEMENT Left 07/06/2022   Procedure: CYSTOSCOPY/URETEROSCOPY/HOLMIUM LASER/STENT EXCHANGE;  Surgeon: Riki Altes, MD;  Location: [facility];  Service: Urology;  Laterality: Left;   dental implant  11/2013   ENDOSCOPIC PLANTAR FASCIOTOMY Right 12/2013   HAND SURGERY Right    KNEE ARTHROSCOPY  1985   LITHOTRIPSY     multiple   PARTIAL GASTRECTOMY  01/01/2016   PLANTAR FASCIECTOMY Right    SHOULDER SURGERY Left 10/2004   SMALL INTESTINE SURGERY     cervical laminectomy   utereroscopy  1995  Past Surgical History:  Procedure Laterality Date   CERVICAL SPINE SURGERY  2005   C6-7 fusion   CESAREAN SECTION     x2   CYSTOSCOPY WITH RETROGRADE PYELOGRAM, URETEROSCOPY AND STENT PLACEMENT Left 06/21/2022   Procedure: CYSTOSCOPY WITH RETROGRADE PYELOGRAM, URETEROSCOPY AND STENT PLACEMENT;  Surgeon: Vanna Scotland, MD;  Location: [facility];  Service: Urology;  Laterality: Left;   CYSTOSCOPY/URETEROSCOPY/HOLMIUM LASER/STENT PLACEMENT Left 07/06/2022   Procedure: CYSTOSCOPY/URETEROSCOPY/HOLMIUM LASER/STENT EXCHANGE;  Surgeon: Riki Altes, MD;  Location: [facility];  Service: Urology;  Laterality: Left;   dental implant  11/2013   ENDOSCOPIC  PLANTAR FASCIOTOMY Right 12/2013   HAND SURGERY Right    KNEE ARTHROSCOPY  1985   LITHOTRIPSY     multiple   PARTIAL GASTRECTOMY  01/01/2016   PLANTAR FASCIECTOMY Right    SHOULDER SURGERY Left 10/2004   SMALL INTESTINE SURGERY     cervical laminectomy   utereroscopy  1995   Past Medical History:  Diagnosis Date   Alopecia    Anemia    Anxiety    Cervical radiculopathy at C6    Chronic kidney disease    stones   Chronic pain    Complex regional pain syndrome I    right foot   Depression    Diabetes mellitus type 2, uncomplicated    GERD (gastroesophageal reflux disease)    Heart murmur    History of kidney stones    Hypercholesterolemia    Hypertension, essential, benign    Kidney stone    Morbid obesity    Neuromuscular disorder    Plantar fasciitis    Stage 4 chronic kidney disease    Steatosis of liver    Tremor    Vitamin D deficiency    BP 104/70   Pulse 70   Ht 5\' 7"  (1.702 m)   Wt 232 lb 12.8 oz (105.6 kg)   LMP 12/02/2019   SpO2 97%   BMI 36.46 kg/m   Opioid Risk Score:   Fall Risk Score:  `1  Depression screen [facility] 2/9     08/31/2022    3:04 PM 07/28/2022    3:15 PM 05/18/2022    3:38 PM 03/22/2022    3:16 PM 01/19/2022    8:20 AM 12/03/2021    2:34 PM 10/22/2021   11:28 AM  Depression screen PHQ 2/9  Decreased Interest 0 0 0 0 0 1 0  Down, Depressed, Hopeless 0 0 0 0 0 1 0  PHQ - 2 Score 0 0 0 0 0 2 0    Review of Systems  Constitutional: Negative.   HENT: Negative.    Eyes: Negative.   Respiratory: Negative.    Cardiovascular: Negative.   Gastrointestinal: Negative.   Endocrine: Negative.   Genitourinary: Negative.        UTI  Musculoskeletal:  Positive for neck pain.       Knee neck and left arm  Skin: Negative.   Allergic/Immunologic: Negative.   Neurological: Negative.   Hematological: Negative.   Psychiatric/Behavioral: Negative.    All other systems reviewed and are negative.      Objective:   Physical Exam         Assessment & Plan:  1.Complex regional pain syndrome right lower extremity postoperative. She's s/p post plantar fascial release. She continue with sensitivity to touch. She has diffuse numbness and tingling.Gabapentin being weaned slowly . 07/28/2022. Nucynta discontinued due to Urine Clearance. Continue slow weaning. Refilled: Oxycodone 10 mg 4 times a  day as needed for pain #120, .We will continue the opioid monitoring program, this consists of regular clinic visits, examinations, urine drug screen, pill counts as well as use of [place] Controlled Substance Reporting system. A 12 month History has been reviewed on the [place] Controlled Substance Reporting System on 07/28/2022. 2.Myofascial pain syndrome: Continue current medication regime with Tizanidine and  ice, heat and exercise regime. 07/28/2022 3. Depression: Continue: Zoloft. PCP Following. 07/28/2022 4. Cervical Post Laminectomy: Continue to Monitor. 07/28/2022 5. Cervicalgia/Cervical  Radiculopathy/ Left shoulder, Left Arm and Left  Hand Pain with tingling,  Continue current medication regimen. Continue to monitor. 07/28/2022 6. Poly Neuropathy: Continue current medication regimen and :Continue to Monitor. 07/28/2022 7. Left Shoulder Tendonitis: Chronic Left Shoulder Pain:  Ortho Following. Continue current medication regime and Continue  to Monitor. 07/28/2022 8. Right hand pain/ Post Surgical  Procedure: Trigger Finger Release: Dr. Ardyth HarpsHernandez Following.  No complaints Today. 07/28/2022. 9. Tremor: No complaints today. Gabapentin being weaned slowly. Continue to monitor.  03/06//2024 10. Right   Knee Pain: Orthopedics Following: Continue with HEP as Tolerated. Continue to Monitor. 03/06/ 2024 11. Chronic Bilateral  Feet Pain. Podiatry Following.Continue with HEP as tolerated. Continue current medication regimen. Continue to monitor. 07/28/2022  12. Left Greater Trochanter Bursitis: No complaints today. Continue to alternate  Ice and Heat Therapy. Continue to Monitor. 07/28/2022  13. Right Lateral Epicondylitis: No complaints today. Ortho Following. Continue to Monitor.  07/28/2022    F/U in 1 month

## 2022-09-02 ENCOUNTER — Other Ambulatory Visit: Payer: Self-pay | Admitting: Registered Nurse

## 2022-09-02 LAB — CULTURE, URINE COMPREHENSIVE

## 2022-09-03 ENCOUNTER — Telehealth: Payer: Self-pay | Admitting: *Deleted

## 2022-09-03 MED ORDER — OXYBUTYNIN CHLORIDE ER 10 MG PO TB24: 10.0000 mg | ORAL_TABLET | Freq: Every day | ORAL | 1 refills | Status: DC

## 2022-09-03 NOTE — Telephone Encounter (Signed)
Spoke with patient and she is having pain with sitting on her urethra. I will send in oxybutynin xl 10 to [facility] in [place].

## 2022-09-03 NOTE — Telephone Encounter (Signed)
Doxycycline is appropriate to treat her UTI. If she is having persistent dysuria, ok to switch to cefuroxime 250mg  BID x5 days. If her dysuria has resolved and she is just having urgency/urge incontinence, ok to start oxybutynin XL 10mg  daily x1 month with plans for symptom recheck and PVR with me in 1 month.

## 2022-09-03 NOTE — Telephone Encounter (Signed)
Pt calling stating she was seen on Monday 08/30/22 and prescribed Doxycycline and she's not getting any better.pt states the urine culture is back and needs to know if she needs to switch medications.

## 2022-09-06 ENCOUNTER — Other Ambulatory Visit: Payer: 59

## 2022-09-06 DIAGNOSIS — R300 Dysuria: Secondary | ICD-10-CM

## 2022-09-06 LAB — MICROSCOPIC EXAMINATION: WBC, UA: >30 /hpf — AB (ref 0–5)

## 2022-09-06 LAB — URINALYSIS, COMPLETE
Bilirubin, UA: NEGATIVE
Glucose, UA: NEGATIVE
Ketones, UA: NEGATIVE
Nitrite, UA: POSITIVE — AB
RBC, UA: NEGATIVE
Specific Gravity, UA: 1.02 (ref 1.005–1.030)
Urobilinogen, Ur: 0.2 mg/dL (ref 0.2–1.0)
pH, UA: 5.5 (ref 5.0–7.5)

## 2022-09-08 ENCOUNTER — Telehealth: Payer: Self-pay

## 2022-09-08 NOTE — Telephone Encounter (Signed)
Patient called to follow up on her urinalysis, she saw it was abnormal on mychart. I advised patient that we are waiting for urine culture to know the best plan of action for her. Patient is taking Oxybutynin as instructed last week.  Patient does have a concern in regards to the re occurrence of urinary infections and that it keeps showing E coli. She does follow proper hygiene. She is concerned that infection is not been treated completely and she keeps been positive for this. Any recommendations on how to prevent this or longer treatment?

## 2022-09-09 LAB — CULTURE, URINE COMPREHENSIVE

## 2022-09-09 NOTE — Telephone Encounter (Signed)
Her repeat UA did still look suspicious for a UTI, however her urine culture is not growing anything so far. Most importantly, the blood in her urine resolved--this is the think we were really looking for, and that's great news.  As long as her symptoms have resolved, we don't need to treat with additional antibiotics. With the exception of the kidney stone we recently treated, her CT scan from January didn't show any other abnormalities that could explain her infections. I would reassure her that E coli is the most common bacteria associated with bladder infections and this is NOT an indicator of inadequate hygiene--to the contrary, I often find that my recurrent UTI patients are especially thorough with perineal hygiene.   I'd be happy to see her in clinic to discuss recurrent UTI prevention if she'd like.

## 2022-09-10 ENCOUNTER — Encounter (INDEPENDENT_AMBULATORY_CARE_PROVIDER_SITE_OTHER): Payer: Self-pay

## 2022-09-10 NOTE — Telephone Encounter (Signed)
Spoke with patient about negative urine culture results. Pt states she is still having pain around her urethra, no other symptoms.

## 2022-09-25 ENCOUNTER — Other Ambulatory Visit: Payer: Self-pay | Admitting: Physician Assistant

## 2022-09-27 ENCOUNTER — Other Ambulatory Visit: Payer: Self-pay | Admitting: *Deleted

## 2022-09-27 NOTE — Telephone Encounter (Signed)
Requesting 90 supply

## 2022-09-28 ENCOUNTER — Encounter: Payer: Self-pay | Admitting: Registered Nurse

## 2022-09-28 ENCOUNTER — Encounter: Payer: 59 | Attending: Physical Medicine & Rehabilitation | Admitting: Registered Nurse

## 2022-09-28 VITALS — BP 115/79 | HR 74 | Ht 67.0 in | Wt 230.0 lb

## 2022-09-28 DIAGNOSIS — M1711 Unilateral primary osteoarthritis, right knee: Secondary | ICD-10-CM | POA: Diagnosis not present

## 2022-09-28 DIAGNOSIS — M5412 Radiculopathy, cervical region: Secondary | ICD-10-CM | POA: Insufficient documentation

## 2022-09-28 DIAGNOSIS — Z5181 Encounter for therapeutic drug level monitoring: Secondary | ICD-10-CM | POA: Diagnosis present

## 2022-09-28 DIAGNOSIS — G8929 Other chronic pain: Secondary | ICD-10-CM | POA: Diagnosis present

## 2022-09-28 DIAGNOSIS — G894 Chronic pain syndrome: Secondary | ICD-10-CM | POA: Diagnosis present

## 2022-09-28 DIAGNOSIS — M542 Cervicalgia: Secondary | ICD-10-CM | POA: Diagnosis not present

## 2022-09-28 DIAGNOSIS — G90521 Complex regional pain syndrome I of right lower limb: Secondary | ICD-10-CM | POA: Insufficient documentation

## 2022-09-28 DIAGNOSIS — Z79891 Long term (current) use of opiate analgesic: Secondary | ICD-10-CM

## 2022-09-28 DIAGNOSIS — M79671 Pain in right foot: Secondary | ICD-10-CM | POA: Insufficient documentation

## 2022-09-28 MED ORDER — OXYCODONE HCL 10 MG PO TABS: 10.0000 mg | ORAL_TABLET | Freq: Four times a day (QID) | ORAL | 0 refills | Status: DC | PRN

## 2022-09-28 NOTE — Progress Notes (Signed)
Subjective:    Patient ID: Sabrina Williams, female    DOB: 08-23-1970, 52 y.o.   MRN: [id number]  HPI: Sabrina Williams is a 52 y.o. female who returns for follow up appointment for chronic pain and medication refill. She states her pain is located in her neck radiating into her left shoulder and left arm with tingling and numbness. She also reports right knee pain and right foot pain. She  rates her pain 2. Her current exercise regime is walking and performing stretching exercises.  Sabrina Williams Morphine equivalent is 60.00 MME.   Last UDS was Performed on 07/28/2022, it was consistent.    Pain Inventory Average Pain 3 Pain Right Now 2 My pain is constant, sharp, burning, dull, stabbing, tingling, and aching  In the last 24 hours, has pain interfered with the following? General activity 5 Relation with others 4 Enjoyment of life 4 What TIME of day is your pain at its worst? evening Sleep (in general) Fair  Pain is worse with: walking, inactivity, standing, and some activites Pain improves with: rest, heat/ice, therapy/exercise, and medication Relief from Meds: 8  Family History  Problem Relation Age of Onset   Cancer Mother        multiple myeloma   Diabetes Father    Kidney disease Father    Heart disease Father    Social History   Socioeconomic History   Marital status: Married    Spouse name: Not on file   Number of children: 2   Years of education: some college   Highest education level: Not on file  Occupational History   Occupation: homemaker  Tobacco Use   Smoking status: Never   Smokeless tobacco: Never  Vaping Use   Vaping Use: Never used  Substance and Sexual Activity   Alcohol use: No    Alcohol/week: 0.0 standard drinks of alcohol   Drug use: No   Sexual activity: Not on file  Other Topics Concern   Not on file  Social History Narrative   Lives at home with husband.   Right-handed.   No daily caffeine use.   Social Determinants of Health    Financial Resource Strain: Not on file  Food Insecurity: No Food Insecurity (06/21/2022)   Hunger Vital Sign    Worried About Running Out of Food in the Last Year: Never true    Ran Out of Food in the Last Year: Never true  Transportation Needs: No Transportation Needs (06/21/2022)   PRAPARE - Administrator, Civil Service (Medical): No    Lack of Transportation (Non-Medical): No  Physical Activity: Not on file  Stress: Not on file  Social Connections: Not on file   Past Surgical History:  Procedure Laterality Date   CERVICAL SPINE SURGERY  2005   C6-7 fusion   CESAREAN SECTION     x2   CYSTOSCOPY WITH RETROGRADE PYELOGRAM, URETEROSCOPY AND STENT PLACEMENT Left 06/21/2022   Procedure: CYSTOSCOPY WITH RETROGRADE PYELOGRAM, URETEROSCOPY AND STENT PLACEMENT;  Surgeon: Vanna Scotland, MD;  Location: [facility];  Service: Urology;  Laterality: Left;   CYSTOSCOPY/URETEROSCOPY/HOLMIUM LASER/STENT PLACEMENT Left 07/06/2022   Procedure: CYSTOSCOPY/URETEROSCOPY/HOLMIUM LASER/STENT EXCHANGE;  Surgeon: Riki Altes, MD;  Location: [facility];  Service: Urology;  Laterality: Left;   dental implant  11/2013   ENDOSCOPIC PLANTAR FASCIOTOMY Right 12/2013   HAND SURGERY Right    KNEE ARTHROSCOPY  1985   LITHOTRIPSY     multiple   PARTIAL GASTRECTOMY  01/01/2016  PLANTAR FASCIECTOMY Right    SHOULDER SURGERY Left 10/2004   SMALL INTESTINE SURGERY     cervical laminectomy   utereroscopy  1995   Past Surgical History:  Procedure Laterality Date   CERVICAL SPINE SURGERY  2005   C6-7 fusion   CESAREAN SECTION     x2   CYSTOSCOPY WITH RETROGRADE PYELOGRAM, URETEROSCOPY AND STENT PLACEMENT Left 06/21/2022   Procedure: CYSTOSCOPY WITH RETROGRADE PYELOGRAM, URETEROSCOPY AND STENT PLACEMENT;  Surgeon: Vanna Scotland, MD;  Location: [facility];  Service: Urology;  Laterality: Left;   CYSTOSCOPY/URETEROSCOPY/HOLMIUM LASER/STENT PLACEMENT Left 07/06/2022   Procedure:  CYSTOSCOPY/URETEROSCOPY/HOLMIUM LASER/STENT EXCHANGE;  Surgeon: Riki Altes, MD;  Location: [facility];  Service: Urology;  Laterality: Left;   dental implant  11/2013   ENDOSCOPIC PLANTAR FASCIOTOMY Right 12/2013   HAND SURGERY Right    KNEE ARTHROSCOPY  1985   LITHOTRIPSY     multiple   PARTIAL GASTRECTOMY  01/01/2016   PLANTAR FASCIECTOMY Right    SHOULDER SURGERY Left 10/2004   SMALL INTESTINE SURGERY     cervical laminectomy   utereroscopy  1995   Past Medical History:  Diagnosis Date   Alopecia    Anemia    Anxiety    Cervical radiculopathy at C6    Chronic kidney disease    stones   Chronic pain    Complex regional pain syndrome I    right foot   Depression    Diabetes mellitus type 2, uncomplicated (HCC)    GERD (gastroesophageal reflux disease)    Heart murmur    History of kidney stones    Hypercholesterolemia    Hypertension, essential, benign    Kidney stone    Morbid obesity (HCC)    Neuromuscular disorder (HCC)    Plantar fasciitis    Stage 4 chronic kidney disease (HCC)    Steatosis of liver    Tremor    Vitamin D deficiency    BP 115/79   Pulse 74   Ht 5\' 7"  (1.702 m)   Wt 230 lb (104.3 kg)   LMP 12/02/2019   SpO2 100%   BMI 36.02 kg/m   Opioid Risk Score:   Fall Risk Score:  `1  Depression screen [facility] 2/9     08/31/2022    3:04 PM 07/28/2022    3:15 PM 05/18/2022    3:38 PM 03/22/2022    3:16 PM 01/19/2022    8:20 AM 12/03/2021    2:34 PM 10/22/2021   11:28 AM  Depression screen PHQ 2/9  Decreased Interest 0 0 0 0 0 1 0  Down, Depressed, Hopeless 0 0 0 0 0 1 0  PHQ - 2 Score 0 0 0 0 0 2 0      Review of Systems  Musculoskeletal:  Positive for gait problem.       Right arm,knee pain B/l foot pain   All other systems reviewed and are negative.     Objective:   Physical Exam Vitals and nursing note reviewed.  Constitutional:      Appearance: Normal appearance. She is obese.  Cardiovascular:     Rate and Rhythm: Normal rate  and regular rhythm.     Pulses: Normal pulses.     Heart sounds: Normal heart sounds.  Musculoskeletal:     Cervical back: Normal range of motion and neck supple.     Comments: Normal Muscle Bulk and Muscle Testing Reveals:  Upper Extremities:Full  ROM and Muscle Strength 5/5 Left AC Joint  Tenderness  Lower Extremities: Full ROM and Muscle Strength 5/5 Arises from Chair with ease Narrow Based  Gait     Skin:    General: Skin is warm and dry.  Neurological:     Mental Status: She is alert and oriented to person, place, and time.  Psychiatric:        Mood and Affect: Mood normal.        Behavior: Behavior normal.         Assessment & Plan:  1.Complex regional pain syndrome right lower extremity postoperative. She's s/p post plantar fascial release. She continue with sensitivity to touch. She has diffuse numbness and tingling.Gabapentin being weaned slowly . 09/28/2022. Nucynta discontinued due to Urine Clearance. Continue slow weaning. Refilled: Oxycodone 10 mg 4 times a day as needed for pain #120, .We will continue the opioid monitoring program, this consists of regular clinic visits, examinations, urine drug screen, pill counts as well as use of [place] Controlled Substance Reporting system. A 12 month History has been reviewed on the [place] Controlled Substance Reporting System on 09/28/2022. 2.Myofascial pain syndrome: Continue current medication regime with Tizanidine and  ice, heat and exercise regime. 09/28/2022 3. Depression: Continue: Zoloft. PCP Following. 09/28/2022 4. Cervical Post Laminectomy: Continue to Monitor. 09/28/2022 5. Cervicalgia/Cervical  Radiculopathy/ Left shoulder, Left Arm and Left  Hand Pain with tingling,  Continue current medication regimen. Continue to monitor. 09/28/2022 6. Poly Neuropathy: Continue current medication regimen with Nortriptyline  and :Continue to Monitor. 09/28/2022 7. Left Shoulder Tendonitis: Chronic Left Shoulder Pain:   Ortho Following. Continue current medication regime and Continue  to Monitor. 09/28/2022 8. Right hand pain/ Post Surgical  Procedure: Trigger Finger Release: Dr. Ardyth Harps Following.  No complaints Today. 09/28/2022. 9. Tremor: No complaints today. Gabapentin being weaned slowly. Continue to monitor.  05/07//2024 10. Chronic Right   Knee Pain: Orthopedics Following: Continue with HEP as Tolerated. Continue to Monitor. 05/07/ 2024 11. Chronic Right  Foot Pain. Podiatry Following.Continue with HEP as tolerated. Continue current medication regimen. Continue to monitor. 09/28/2022  12. Left Greater Trochanter Bursitis: No complaints today. Continue to alternate Ice and Heat Therapy. Continue to Monitor. 09/28/2022  13. Right Lateral Epicondylitis:  No complaints today. Ortho Following. Continue to Monitor.  08/31/2022    F/U in 1 month

## 2022-10-11 ENCOUNTER — Other Ambulatory Visit: Payer: Self-pay | Admitting: *Deleted

## 2022-10-11 DIAGNOSIS — N200 Calculus of kidney: Secondary | ICD-10-CM

## 2022-10-13 ENCOUNTER — Ambulatory Visit: Payer: 59 | Admitting: Urology

## 2022-10-25 ENCOUNTER — Other Ambulatory Visit: Payer: Self-pay | Admitting: Physician Assistant

## 2022-10-29 ENCOUNTER — Encounter: Payer: Self-pay | Admitting: Urology

## 2022-10-29 ENCOUNTER — Ambulatory Visit
Admission: RE | Admit: 2022-10-29 | Discharge: 2022-10-29 | Disposition: A | Discharge status: Home / Self Care | Payer: 59 | Source: Ambulatory Visit | Attending: Urology | Admitting: Urology

## 2022-10-29 ENCOUNTER — Ambulatory Visit
Admission: RE | Admit: 2022-10-29 | Discharge: 2022-10-29 | Disposition: A | Discharge status: Home / Self Care | Payer: 59 | Attending: Urology | Admitting: Urology

## 2022-10-29 ENCOUNTER — Ambulatory Visit (INDEPENDENT_AMBULATORY_CARE_PROVIDER_SITE_OTHER): Payer: 59 | Admitting: Urology

## 2022-10-29 VITALS — BP 121/85 | HR 89 | Ht 67.0 in | Wt 233.1 lb

## 2022-10-29 DIAGNOSIS — N200 Calculus of kidney: Secondary | ICD-10-CM | POA: Diagnosis not present

## 2022-10-29 DIAGNOSIS — Z466 Encounter for fitting and adjustment of urinary device: Secondary | ICD-10-CM | POA: Insufficient documentation

## 2022-10-29 NOTE — Patient Instructions (Signed)

## 2022-10-29 NOTE — Progress Notes (Signed)
I, Sabrina Williams,acting as a scribe for Sabrina Altes, MD.,have documented all relevant documentation on the behalf of Sabrina Altes, MD,as directed by  Sabrina Altes, MD while in the presence of Sabrina Altes, MD.  10/29/2022 11:07 AM   Sabrina Williams May 26, 1970 [id number]  Referring provider: Luciana Axe, NP [address] [place],  [place] [address]  Chief Complaint  Patient presents with   Follow-up   Nephrolithiasis   Urologic history: 1.  Nephrolithiasis CT July 2023 with bilateral, non-obstructing renal calculi Left ureteroscopic stone removal 06/2022  HPI: Sabrina Williams is a 52 y.o. female presents for follow-up of recurrent nephrolithiasis.  Prior history of recurrent stone disease and most recently status post uretoscopic removal of 14mm left UPJ calculus, February 2024. Stent was removed 07/14/22.  PA visit early April 2024 for dysuria with urinalysis showing pyuria with urine culture positive for E. coli Since her last visit, she has been doing well Has no bothersome lower urinary tract symptoms Denies flank, abdominal, or pelvic pain.    PMH: Past Medical History:  Diagnosis Date   Alopecia    Anemia    Anxiety    Cervical radiculopathy at C6    Chronic kidney disease    stones   Chronic pain    Complex regional pain syndrome I    right foot   Depression    Diabetes mellitus type 2, uncomplicated (HCC)    GERD (gastroesophageal reflux disease)    Heart murmur    History of kidney stones    Hypercholesterolemia    Hypertension, essential, benign    Kidney stone    Morbid obesity (HCC)    Neuromuscular disorder (HCC)    Plantar fasciitis    Stage 4 chronic kidney disease (HCC)    Steatosis of liver    Tremor    Vitamin D deficiency     Surgical History: Past Surgical History:  Procedure Laterality Date   CERVICAL SPINE SURGERY  2005   C6-7 fusion   CESAREAN SECTION     x2   CYSTOSCOPY WITH RETROGRADE PYELOGRAM,  URETEROSCOPY AND STENT PLACEMENT Left 06/21/2022   Procedure: CYSTOSCOPY WITH RETROGRADE PYELOGRAM, URETEROSCOPY AND STENT PLACEMENT;  Surgeon: Vanna Scotland, MD;  Location: [facility];  Service: Urology;  Laterality: Left;   CYSTOSCOPY/URETEROSCOPY/HOLMIUM LASER/STENT PLACEMENT Left 07/06/2022   Procedure: CYSTOSCOPY/URETEROSCOPY/HOLMIUM LASER/STENT EXCHANGE;  Surgeon: Sabrina Altes, MD;  Location: [facility];  Service: Urology;  Laterality: Left;   dental implant  11/2013   ENDOSCOPIC PLANTAR FASCIOTOMY Right 12/2013   HAND SURGERY Right    KNEE ARTHROSCOPY  1985   LITHOTRIPSY     multiple   PARTIAL GASTRECTOMY  01/01/2016   PLANTAR FASCIECTOMY Right    SHOULDER SURGERY Left 10/2004   SMALL INTESTINE SURGERY     cervical laminectomy   utereroscopy  1995    Home Medications:  Allergies as of 10/29/2022       Reactions   Penicillins Hives, Rash        Medication List        Accurate as of October 29, 2022 11:07 AM. If you have any questions, ask your nurse or doctor.          Bariatric Multivitamins/Iron Caps Take 3 capsules by mouth daily before supper.   benazepril 20 MG tablet Commonly known as: LOTENSIN Take 20 mg by mouth daily.   calcitRIOL 0.5 MCG capsule Commonly known as: ROCALTROL Take 0.5 mcg by  mouth daily.   calcium carbonate 1250 MG capsule Take 1,250 mg by mouth daily.   DULoxetine 60 MG capsule Commonly known as: CYMBALTA Take 60 mg by mouth daily.   ferrous sulfate 325 (65 FE) MG tablet Take 325 mg by mouth daily with breakfast.   Fingerstix Lancets Misc Use 1 each 2 (two) times daily As directed [Test blood sugars in rotating pattern:  fasting, before meals, 2 hours after a meal, at bedtime]   gabapentin 100 MG capsule Commonly known as: NEURONTIN TAKE 1 CAPSULE BY MOUTH EVERY DAY   glucose blood test strip USE 1 STRIP TWICE A DAY AS DIRECTED   lovastatin 20 MG tablet Commonly known as: MEVACOR Take 20 mg by mouth every evening.    magnesium oxide 400 MG tablet Commonly known as: MAG-OX Take 1 tablet by mouth daily.   nortriptyline 75 MG capsule Commonly known as: PAMELOR TAKE 1 CAPSULE BY MOUTH EVERYDAY AT BEDTIME   omeprazole 10 MG capsule Commonly known as: PRILOSEC Take 20 mg by mouth daily.   ondansetron 4 MG tablet Commonly known as: ZOFRAN TAKE 1 TABLET (4 MG TOTAL) BY MOUTH 2 (TWO) TIMES DAILY AS NEEDED FOR NAUSEA OR VOMITING.   oxybutynin 10 MG 24 hr tablet Commonly known as: DITROPAN-XL TAKE 1 TABLET BY MOUTH EVERYDAY AT BEDTIME   Oxycodone HCl 10 MG Tabs Take 1 tablet (10 mg total) by mouth 4 (four) times daily as needed.   tamsulosin 0.4 MG Caps capsule Commonly known as: FLOMAX Take 1 capsule (0.4 mg total) by mouth daily.   tiZANidine 4 MG tablet Commonly known as: ZANAFLEX TAKE 1 TABLET (4 MG TOTAL) BY MOUTH 5 (FIVE) TIMES DAILY AS NEEDED FOR MUSCLE SPASMS.   VITAMIN D3 PO Take 2,000 Units by mouth daily.        Allergies:  Allergies  Allergen Reactions   Penicillins Hives and Rash    Family History: Family History  Problem Relation Age of Onset   Cancer Mother        multiple myeloma   Diabetes Father    Kidney disease Father    Heart disease Father     Social History:  reports that she has never smoked. She has never used smokeless tobacco. She reports that she does not drink alcohol and does not use drugs.   Physical Exam: BP 121/85   Pulse 89   Ht 5\' 7"  (1.702 m)   Wt 233 lb 2 oz (105.7 kg)   LMP 12/02/2019   BMI 36.51 kg/m   Constitutional:  Alert and oriented, No acute distress. HEENT: [place] AT Respiratory: Normal respiratory effort, no increased work of breathing. Psychiatric: Normal mood and affect.   Pertinent Imaging: KUB performed earlier today was personally reviewed and interpreted. There is a 5mm calcification overlying the inferior portion of the right renal outline. No left sided calcifications are seen. Prior CT showed a 5mm right lower pole  calculus.   Assessment & Plan:    1. Recurrent nephrolithiasis Doing well status post ureteroscopic stone removal  2. Non-obstructing right lower pole renal calculus We had discussed pursuing a metabolic evaluation at our last visit and she desires to have this done. It was ordered through [facility]- we will call with results 1 year follow-up with KUB. Instructed to call earlier for recurrent stone symptoms/renal colic  I have reviewed the above documentation for accuracy and completeness, and I agree with the above.   Sabrina Altes, MD  [facility] [phone]  [address] [place], [place] [address] [phone]

## 2022-11-01 ENCOUNTER — Telehealth: Payer: Self-pay | Admitting: Registered Nurse

## 2022-11-01 ENCOUNTER — Encounter: Payer: 59 | Admitting: Registered Nurse

## 2022-11-01 MED ORDER — OXYCODONE HCL 10 MG PO TABS: 10.0000 mg | ORAL_TABLET | Freq: Four times a day (QID) | ORAL | 0 refills | Status: DC | PRN

## 2022-11-01 NOTE — Addendum Note (Signed)
Addended by: Jones Bales on: 11/01/2022 04:33 PM   Modules accepted: Orders

## 2022-11-01 NOTE — Telephone Encounter (Signed)
Patient called because she was not going to be able to make it in time for appointment , her husband is having surgery 6/11 and could not come in . Patient asked if her oxycodone could be refilled for her . If approved patient would like it sent to [facility] in [facility]

## 2022-11-22 ENCOUNTER — Other Ambulatory Visit: Payer: Self-pay | Admitting: Physician Assistant

## 2022-11-22 ENCOUNTER — Other Ambulatory Visit: Payer: 59

## 2022-11-22 DIAGNOSIS — N200 Calculus of kidney: Secondary | ICD-10-CM

## 2022-11-23 LAB — LITHOLINK SERUM PANEL
CO2: 20 mmol/L (ref 20–29)
Calcium: 9 mg/dL (ref 8.7–10.2)
Chloride: 108 mmol/L — ABNORMAL HIGH (ref 96–106)
Creatinine, Ser: 2.43 mg/dL — ABNORMAL HIGH (ref 0.57–1.00)
Magnesium: 2.2 mg/dL (ref 1.6–2.3)
Phosphorus: 5.8 mg/dL — ABNORMAL HIGH (ref 3.0–4.3)
Potassium: 5.8 mmol/L — ABNORMAL HIGH (ref 3.5–5.2)
Sodium: 142 mmol/L (ref 134–144)
Uric Acid: 5.4 mg/dL (ref 3.0–7.2)
eGFR: 23 mL/min/{1.73_m2} — ABNORMAL LOW (ref >59)

## 2022-11-24 ENCOUNTER — Ambulatory Visit: Admission: RE | Admit: 2022-11-24 | Payer: 59 | Source: Ambulatory Visit

## 2022-11-27 LAB — LITHOLINK 24HR URINE PANEL
Ammonium, Urine: 32 mmol/24 hr (ref 15–60)
Calcium Oxalate Saturation: 2.63 — ABNORMAL LOW (ref 6.00–10.00)
Calcium Phosphate Saturation: 0.03 — ABNORMAL LOW (ref 0.50–2.00)
Calcium, Urine: 29 mg/24 hr (ref <200)
Calcium/Creatinine Ratio: 20 mg/g creat — ABNORMAL LOW (ref 51–262)
Calcium/Kg Body Weight: 0.3 mg/24 hr/kg (ref <4.0)
Chloride, Urine: 205 mmol/24 hr (ref 70–250)
Citrate, Urine: <38 mg/24 hr — ABNORMAL LOW (ref >550)
Creatinine, Urine: 1481 mg/24 hr
Creatinine/Kg Body Weight: 14.3 mg/24 hr/kg (ref 8.7–20.3)
Magnesium, Urine: 232 mg/24 hr — ABNORMAL HIGH (ref 30–120)
Oxalate, Urine: 160 mg/24 hr — ABNORMAL HIGH (ref 20–40)
Phosphorus, Urine: 1259 mg/24 hr — ABNORMAL HIGH (ref 600–1200)
Potassium, Urine: 45 mmol/24 hr (ref 20–100)
Protein Catabolic Rate: 1 g/kg/24 hr (ref 0.8–1.4)
Sodium, Urine: 216 mmol/24 hr — ABNORMAL HIGH (ref 50–150)
Sulfate, Urine: 27 meq/24 hr (ref 20–80)
Urea Nitrogen, Urine: 13.26 g/24 hr (ref 6.00–14.00)
Uric Acid Saturation: 0.86 (ref <1.00)
Uric Acid, Urine: 379 mg/24 hr (ref <750)
Urine Volume (Preserved): 2510 mL/24 hr (ref 500–4000)
pH, 24 hr, Urine: 5.33 — ABNORMAL LOW (ref 5.800–6.200)

## 2022-11-29 ENCOUNTER — Ambulatory Visit
Admission: RE | Admit: 2022-11-29 | Discharge: 2022-11-29 | Disposition: A | Discharge status: Home / Self Care | Payer: 59 | Source: Ambulatory Visit | Attending: Nephrology | Admitting: Nephrology

## 2022-11-29 DIAGNOSIS — E875 Hyperkalemia: Secondary | ICD-10-CM | POA: Diagnosis not present

## 2022-11-29 DIAGNOSIS — N184 Chronic kidney disease, stage 4 (severe): Secondary | ICD-10-CM | POA: Insufficient documentation

## 2022-11-29 MED ORDER — SODIUM CHLORIDE 0.9 % IV SOLN: INTRAVENOUS | Status: AC

## 2022-12-01 ENCOUNTER — Encounter: Payer: Self-pay | Admitting: Registered Nurse

## 2022-12-01 ENCOUNTER — Encounter: Payer: 59 | Attending: Physical Medicine & Rehabilitation | Admitting: Registered Nurse

## 2022-12-01 VITALS — BP 120/82 | HR 86 | Ht 67.0 in | Wt 234.0 lb

## 2022-12-01 DIAGNOSIS — Z79891 Long term (current) use of opiate analgesic: Secondary | ICD-10-CM | POA: Diagnosis present

## 2022-12-01 DIAGNOSIS — M79671 Pain in right foot: Secondary | ICD-10-CM | POA: Insufficient documentation

## 2022-12-01 DIAGNOSIS — G894 Chronic pain syndrome: Secondary | ICD-10-CM | POA: Diagnosis present

## 2022-12-01 DIAGNOSIS — M1711 Unilateral primary osteoarthritis, right knee: Secondary | ICD-10-CM

## 2022-12-01 DIAGNOSIS — M545 Low back pain, unspecified: Secondary | ICD-10-CM | POA: Diagnosis present

## 2022-12-01 DIAGNOSIS — Z5181 Encounter for therapeutic drug level monitoring: Secondary | ICD-10-CM

## 2022-12-01 DIAGNOSIS — M542 Cervicalgia: Secondary | ICD-10-CM | POA: Diagnosis present

## 2022-12-01 DIAGNOSIS — M5412 Radiculopathy, cervical region: Secondary | ICD-10-CM | POA: Diagnosis present

## 2022-12-01 DIAGNOSIS — M25512 Pain in left shoulder: Secondary | ICD-10-CM | POA: Diagnosis present

## 2022-12-01 DIAGNOSIS — G90521 Complex regional pain syndrome I of right lower limb: Secondary | ICD-10-CM | POA: Diagnosis present

## 2022-12-01 DIAGNOSIS — G8929 Other chronic pain: Secondary | ICD-10-CM | POA: Diagnosis present

## 2022-12-01 MED ORDER — OXYCODONE HCL 10 MG PO TABS: 10.0000 mg | ORAL_TABLET | Freq: Four times a day (QID) | ORAL | 0 refills | Status: DC | PRN

## 2022-12-01 NOTE — Progress Notes (Signed)
Subjective:    Patient ID: Sabrina Williams, female    DOB: 03/10/1971, 52 y.o.   MRN: [id number]  HPI: Sabrina Williams is a 52 y.o. female who returns for follow up appointment for chronic pain and medication refill. She states her pain is located in her neck radiating into her left shoulder, left arm radiating into her left thumb, index and middle finger with tingling and lower back pain. Also reports right foot pain and right knee pain. She rates her pain 3. Her current exercise regime is walking and performing stretching exercises.  Ms. Cryder Morphine equivalent is 60.00 MME.   Last UDS was Performed on 07/28/2022, it was consistent.     Pain Inventory Average Pain 5 Pain Right Now 3 My pain is intermittent, constant, sharp, burning, dull, tingling, and aching  In the last 24 hours, has pain interfered with the following? General activity 7 Relation with others 4 Enjoyment of life 6 What TIME of day is your pain at its worst? evening Sleep (in general) Fair  Pain is worse with: some activites Pain improves with: rest, heat/ice, and medication Relief from Meds: 7  Family History  Problem Relation Age of Onset   Cancer Mother        multiple myeloma   Diabetes Father    Kidney disease Father    Heart disease Father    Social History   Socioeconomic History   Marital status: Married    Spouse name: Not on file   Number of children: 2   Years of education: some college   Highest education level: Not on file  Occupational History   Occupation: homemaker  Tobacco Use   Smoking status: Never   Smokeless tobacco: Never  Vaping Use   Vaping Use: Never used  Substance and Sexual Activity   Alcohol use: No    Alcohol/week: 0.0 standard drinks of alcohol   Drug use: No   Sexual activity: Not on file  Other Topics Concern   Not on file  Social History Narrative   Lives at home with husband.   Right-handed.   No daily caffeine use.   Social Determinants of  Health   Financial Resource Strain: Not on file  Food Insecurity: No Food Insecurity (06/21/2022)   Hunger Vital Sign    Worried About Running Out of Food in the Last Year: Never true    Ran Out of Food in the Last Year: Never true  Transportation Needs: No Transportation Needs (06/21/2022)   PRAPARE - Administrator, Civil Service (Medical): No    Lack of Transportation (Non-Medical): No  Physical Activity: Not on file  Stress: Not on file  Social Connections: Not on file   Past Surgical History:  Procedure Laterality Date   CERVICAL SPINE SURGERY  2005   C6-7 fusion   CESAREAN SECTION     x2   CYSTOSCOPY WITH RETROGRADE PYELOGRAM, URETEROSCOPY AND STENT PLACEMENT Left 06/21/2022   Procedure: CYSTOSCOPY WITH RETROGRADE PYELOGRAM, URETEROSCOPY AND STENT PLACEMENT;  Surgeon: Vanna Scotland, MD;  Location: [facility];  Service: Urology;  Laterality: Left;   CYSTOSCOPY/URETEROSCOPY/HOLMIUM LASER/STENT PLACEMENT Left 07/06/2022   Procedure: CYSTOSCOPY/URETEROSCOPY/HOLMIUM LASER/STENT EXCHANGE;  Surgeon: Riki Altes, MD;  Location: [facility];  Service: Urology;  Laterality: Left;   dental implant  11/2013   ENDOSCOPIC PLANTAR FASCIOTOMY Right 12/2013   HAND SURGERY Right    KNEE ARTHROSCOPY  1985   LITHOTRIPSY     multiple   PARTIAL  GASTRECTOMY  01/01/2016   PLANTAR FASCIECTOMY Right    SHOULDER SURGERY Left 10/2004   SMALL INTESTINE SURGERY     cervical laminectomy   utereroscopy  1995   Past Surgical History:  Procedure Laterality Date   CERVICAL SPINE SURGERY  2005   C6-7 fusion   CESAREAN SECTION     x2   CYSTOSCOPY WITH RETROGRADE PYELOGRAM, URETEROSCOPY AND STENT PLACEMENT Left 06/21/2022   Procedure: CYSTOSCOPY WITH RETROGRADE PYELOGRAM, URETEROSCOPY AND STENT PLACEMENT;  Surgeon: Vanna Scotland, MD;  Location: [facility];  Service: Urology;  Laterality: Left;   CYSTOSCOPY/URETEROSCOPY/HOLMIUM LASER/STENT PLACEMENT Left 07/06/2022   Procedure:  CYSTOSCOPY/URETEROSCOPY/HOLMIUM LASER/STENT EXCHANGE;  Surgeon: Riki Altes, MD;  Location: [facility];  Service: Urology;  Laterality: Left;   dental implant  11/2013   ENDOSCOPIC PLANTAR FASCIOTOMY Right 12/2013   HAND SURGERY Right    KNEE ARTHROSCOPY  1985   LITHOTRIPSY     multiple   PARTIAL GASTRECTOMY  01/01/2016   PLANTAR FASCIECTOMY Right    SHOULDER SURGERY Left 10/2004   SMALL INTESTINE SURGERY     cervical laminectomy   utereroscopy  1995   Past Medical History:  Diagnosis Date   Alopecia    Anemia    Anxiety    Cervical radiculopathy at C6    Chronic kidney disease    stones   Chronic pain    Complex regional pain syndrome I    right foot   Depression    Diabetes mellitus type 2, uncomplicated (HCC)    GERD (gastroesophageal reflux disease)    Heart murmur    History of kidney stones    Hypercholesterolemia    Hypertension, essential, benign    Kidney stone    Morbid obesity (HCC)    Neuromuscular disorder (HCC)    Plantar fasciitis    Stage 4 chronic kidney disease (HCC)    Steatosis of liver    Tremor    Vitamin D deficiency    LMP 12/02/2019   Opioid Risk Score:   Fall Risk Score:  `1  Depression screen [facility] 2/9     08/31/2022    3:04 PM 07/28/2022    3:15 PM 05/18/2022    3:38 PM 03/22/2022    3:16 PM 01/19/2022    8:20 AM 12/03/2021    2:34 PM 10/22/2021   11:28 AM  Depression screen PHQ 2/9  Decreased Interest 0 0 0 0 0 1 0  Down, Depressed, Hopeless 0 0 0 0 0 1 0  PHQ - 2 Score 0 0 0 0 0 2 0     Review of Systems  Musculoskeletal:        LT leg RT knee b/l foot pain  All other systems reviewed and are negative.      Objective:   Physical Exam Vitals and nursing note reviewed.  Constitutional:      Appearance: Normal appearance.  Cardiovascular:     Rate and Rhythm: Normal rate and regular rhythm.     Pulses: Normal pulses.     Heart sounds: Normal heart sounds.  Pulmonary:     Effort: Pulmonary effort is normal.     Breath  sounds: Normal breath sounds.  Musculoskeletal:     Cervical back: Normal range of motion and neck supple.     Comments: Normal Muscle Bulk and Muscle Testing Reveals:  Upper Extremities: Full ROM and Muscle Strength 5/5 Left AC Joint Tenderness Lumbar Hypersensitivity Lower Extremities: Full ROM and Muscle Strength 5/5 Arises from  Table with ease Narrow Based  Gait     Skin:    General: Skin is warm and dry.  Neurological:     Mental Status: She is alert and oriented to person, place, and time.  Psychiatric:        Mood and Affect: Mood normal.        Behavior: Behavior normal.         Assessment & Plan:  1.Complex regional pain syndrome right lower extremity postoperative. She's s/p post plantar fascial release. She continue with sensitivity to touch. She has diffuse numbness and tingling.Gabapentin being weaned slowly . 12/01/2022. Nucynta discontinued due to Urine Clearance. Continue slow weaning. Refilled: Oxycodone 10 mg 4 times a day as needed for pain #120, .We will continue the opioid monitoring program, this consists of regular clinic visits, examinations, urine drug screen, pill counts as well as use of [place] Controlled Substance Reporting system. A 12 month History has been reviewed on the [place] Controlled Substance Reporting System on 12/01/2022. 2.Myofascial pain syndrome: Continue current medication regime with Tizanidine and  ice, heat and exercise regime. 12/01/2022 3. Depression: Continue: Zoloft. PCP Following. 12/01/2022 4. Cervical Post Laminectomy: Continue to Monitor. 12/01/2022 5. Cervicalgia/Cervical  Radiculopathy/ Left shoulder, Left Arm and Left  Hand Pain with tingling,  Continue current medication regimen. Continue to monitor. 12/01/2022 6. Poly Neuropathy: Continue current medication regimen with Nortriptyline  and :Continue to Monitor. 12/01/2022 7. Left Shoulder Tendonitis: Chronic Left Shoulder Pain:  Ortho Following. Continue current  medication regime and Continue  to Monitor. 12/01/2022 8. Right hand pain/ Post Surgical  Procedure: Trigger Finger Release: Dr. Ardyth Harps Following.  No complaints Today. 12/01/2022. 9. Tremor: No complaints today. Gabapentin being weaned slowly. Continue to monitor.  07/10//2024 10. Chronic Right   Knee Pain: Orthopedics Following: Continue with HEP as Tolerated. Continue to Monitor. 12/01/2022 11. Chronic Right  Foot Pain. Podiatry Following.Continue with HEP as tolerated. Continue current medication regimen. Continue to monitor. 12/01/2022  12. Left Greater Trochanter Bursitis: No complaints today. Continue to alternate Ice and Heat Therapy. Continue to Monitor. 12/01/2022  13. Right Lateral Epicondylitis:  No complaints today. Ortho Following. Continue to Monitor.  12/01/2022    F/U in 1 month

## 2022-12-07 ENCOUNTER — Other Ambulatory Visit: Payer: Self-pay | Admitting: *Deleted

## 2022-12-07 DIAGNOSIS — N200 Calculus of kidney: Secondary | ICD-10-CM

## 2022-12-20 ENCOUNTER — Ambulatory Visit
Admission: RE | Admit: 2022-12-20 | Discharge: 2022-12-20 | Disposition: A | Discharge status: Home / Self Care | Payer: 59 | Source: Ambulatory Visit | Attending: Urology | Admitting: Urology

## 2022-12-20 DIAGNOSIS — N200 Calculus of kidney: Secondary | ICD-10-CM | POA: Insufficient documentation

## 2022-12-27 ENCOUNTER — Encounter: Payer: Self-pay | Admitting: Registered Nurse

## 2022-12-27 ENCOUNTER — Encounter: Payer: 59 | Attending: Physical Medicine & Rehabilitation | Admitting: Registered Nurse

## 2022-12-27 VITALS — BP 121/88 | HR 90 | Ht 67.0 in | Wt 240.8 lb

## 2022-12-27 DIAGNOSIS — G894 Chronic pain syndrome: Secondary | ICD-10-CM

## 2022-12-27 DIAGNOSIS — G90521 Complex regional pain syndrome I of right lower limb: Secondary | ICD-10-CM | POA: Diagnosis present

## 2022-12-27 DIAGNOSIS — G8929 Other chronic pain: Secondary | ICD-10-CM | POA: Diagnosis present

## 2022-12-27 DIAGNOSIS — M25512 Pain in left shoulder: Secondary | ICD-10-CM | POA: Diagnosis present

## 2022-12-27 DIAGNOSIS — M1711 Unilateral primary osteoarthritis, right knee: Secondary | ICD-10-CM

## 2022-12-27 DIAGNOSIS — M545 Low back pain, unspecified: Secondary | ICD-10-CM

## 2022-12-27 DIAGNOSIS — M792 Neuralgia and neuritis, unspecified: Secondary | ICD-10-CM | POA: Diagnosis present

## 2022-12-27 DIAGNOSIS — Z79891 Long term (current) use of opiate analgesic: Secondary | ICD-10-CM | POA: Diagnosis present

## 2022-12-27 DIAGNOSIS — Z5181 Encounter for therapeutic drug level monitoring: Secondary | ICD-10-CM | POA: Diagnosis present

## 2022-12-27 MED ORDER — OXYCODONE HCL 10 MG PO TABS: 10.0000 mg | ORAL_TABLET | Freq: Four times a day (QID) | ORAL | 0 refills | Status: DC | PRN

## 2022-12-27 NOTE — Progress Notes (Signed)
Subjective:    Patient ID: Sabrina Williams, female    DOB: 1970-09-28, 52 y.o.   MRN: [id number]  HPI: Sabrina Williams is a 52 y.o. female who returns for follow up appointment for chronic pain and medication refill. She states her pain is located in her left shoulder: Ortho following, lower back pain and right knee pain. She also reports bilateral feet with tingling and burning. She rates her pain 4. Her current exercise regime is walking and performing stretching exercises. She will begin weekly Physical therapy on her left shoulder, soon. She's awaiting on a scheduled appointment.   [name] Morphine equivalent is 60.00 MME.   UDS ordered today.      Pain Inventory Average Pain 5 Pain Right Now 4 My pain is constant, sharp, burning, dull, stabbing, tingling, and aching  In the last 24 hours, has pain interfered with the following? General activity 7 Relation with others 5 Enjoyment of life 7 What TIME of day is your pain at its worst? evening Sleep (in general) Fair  Pain is worse with: walking, bending, sitting, standing, and some activites Pain improves with: rest, heat/ice, therapy/exercise, and medication Relief from Meds: 6  Family History  Problem Relation Age of Onset   Cancer Mother        multiple myeloma   Diabetes Father    Kidney disease Father    Heart disease Father    Social History   Socioeconomic History   Marital status: Married    Spouse name: Not on file   Number of children: 2   Years of education: some college   Highest education level: Not on file  Occupational History   Occupation: homemaker  Tobacco Use   Smoking status: Never   Smokeless tobacco: Never  Vaping Use   Vaping status: Never Used  Substance and Sexual Activity   Alcohol use: No    Alcohol/week: 0.0 standard drinks of alcohol   Drug use: No   Sexual activity: Not on file  Other Topics Concern   Not on file  Social History Narrative   Lives at home with husband.    Right-handed.   No daily caffeine use.   Social Determinants of Health   Financial Resource Strain: Not on file  Food Insecurity: No Food Insecurity (06/21/2022)   Hunger Vital Sign    Worried About Running Out of Food in the Last Year: Never true    Ran Out of Food in the Last Year: Never true  Transportation Needs: No Transportation Needs (06/21/2022)   PRAPARE - Administrator, Civil Service (Medical): No    Lack of Transportation (Non-Medical): No  Physical Activity: Not on file  Stress: Not on file  Social Connections: Not on file   Past Surgical History:  Procedure Laterality Date   CERVICAL SPINE SURGERY  2005   C6-7 fusion   CESAREAN SECTION     x2   CYSTOSCOPY WITH RETROGRADE PYELOGRAM, URETEROSCOPY AND STENT PLACEMENT Left 06/21/2022   Procedure: CYSTOSCOPY WITH RETROGRADE PYELOGRAM, URETEROSCOPY AND STENT PLACEMENT;  Surgeon: Vanna Scotland, MD;  Location: [facility];  Service: Urology;  Laterality: Left;   CYSTOSCOPY/URETEROSCOPY/HOLMIUM LASER/STENT PLACEMENT Left 07/06/2022   Procedure: CYSTOSCOPY/URETEROSCOPY/HOLMIUM LASER/STENT EXCHANGE;  Surgeon: Riki Altes, MD;  Location: [facility];  Service: Urology;  Laterality: Left;   dental implant  11/2013   ENDOSCOPIC PLANTAR FASCIOTOMY Right 12/2013   HAND SURGERY Right    KNEE ARTHROSCOPY  1985   LITHOTRIPSY  multiple   PARTIAL GASTRECTOMY  01/01/2016   PLANTAR FASCIECTOMY Right    SHOULDER SURGERY Left 10/2004   SMALL INTESTINE SURGERY     cervical laminectomy   utereroscopy  1995   Past Surgical History:  Procedure Laterality Date   CERVICAL SPINE SURGERY  2005   C6-7 fusion   CESAREAN SECTION     x2   CYSTOSCOPY WITH RETROGRADE PYELOGRAM, URETEROSCOPY AND STENT PLACEMENT Left 06/21/2022   Procedure: CYSTOSCOPY WITH RETROGRADE PYELOGRAM, URETEROSCOPY AND STENT PLACEMENT;  Surgeon: Vanna Scotland, MD;  Location: [facility];  Service: Urology;  Laterality: Left;    CYSTOSCOPY/URETEROSCOPY/HOLMIUM LASER/STENT PLACEMENT Left 07/06/2022   Procedure: CYSTOSCOPY/URETEROSCOPY/HOLMIUM LASER/STENT EXCHANGE;  Surgeon: Riki Altes, MD;  Location: [facility];  Service: Urology;  Laterality: Left;   dental implant  11/2013   ENDOSCOPIC PLANTAR FASCIOTOMY Right 12/2013   HAND SURGERY Right    KNEE ARTHROSCOPY  1985   LITHOTRIPSY     multiple   PARTIAL GASTRECTOMY  01/01/2016   PLANTAR FASCIECTOMY Right    SHOULDER SURGERY Left 10/2004   SMALL INTESTINE SURGERY     cervical laminectomy   utereroscopy  1995   Past Medical History:  Diagnosis Date   Alopecia    Anemia    Anxiety    Cervical radiculopathy at C6    Chronic kidney disease    stones   Chronic pain    Complex regional pain syndrome I    right foot   Depression    Diabetes mellitus type 2, uncomplicated (HCC)    GERD (gastroesophageal reflux disease)    Heart murmur    History of kidney stones    Hypercholesterolemia    Hypertension, essential, benign    Kidney stone    Morbid obesity (HCC)    Neuromuscular disorder (HCC)    Plantar fasciitis    Stage 4 chronic kidney disease (HCC)    Steatosis of liver    Tremor    Vitamin D deficiency    BP 121/88   Pulse 90   Ht 5\' 7"  (1.702 m)   Wt 240 lb 12.8 oz (109.2 kg)   LMP 12/02/2019   SpO2 99%   BMI 37.71 kg/m   Opioid Risk Score:   Fall Risk Score:  `1  Depression screen [facility] 2/9     12/27/2022   10:53 AM 12/01/2022   10:21 AM 08/31/2022    3:04 PM 07/28/2022    3:15 PM 05/18/2022    3:38 PM 03/22/2022    3:16 PM 01/19/2022    8:20 AM  Depression screen PHQ 2/9  Decreased Interest 0 0 0 0 0 0 0  Down, Depressed, Hopeless 0 0 0 0 0 0 0  PHQ - 2 Score 0 0 0 0 0 0 0    Review of Systems  Constitutional: Negative.   HENT: Negative.    Eyes: Negative.   Respiratory: Negative.    Cardiovascular: Negative.   Gastrointestinal: Negative.   Endocrine: Negative.   Genitourinary: Negative.   Musculoskeletal:        Left  shoulder and arm and right knee, both feet  Skin: Negative.   Allergic/Immunologic: Negative.   Neurological: Negative.   Hematological: Negative.   Psychiatric/Behavioral: Negative.    All other systems reviewed and are negative.      Objective:   Physical Exam Vitals and nursing note reviewed.  Constitutional:      Appearance: Normal appearance. She is obese.  Cardiovascular:     Rate  and Rhythm: Normal rate and regular rhythm.     Pulses: Normal pulses.     Heart sounds: Normal heart sounds.  Pulmonary:     Effort: Pulmonary effort is normal.     Breath sounds: Normal breath sounds.  Musculoskeletal:     Cervical back: Normal range of motion and neck supple.     Comments: Normal Muscle Bulk and Muscle Testing Reveals:  Upper Extremities: Full ROM and Muscle Strength 5/5 Left AC Joint Tenderness Lower Extremities: Full ROM and Muscle Strength 5/5 Right Lower Extremity Flexion Produces Pain into her right Patella Arises from Table slowly Narrow Based  Gait     Skin:    General: Skin is warm and dry.  Neurological:     Mental Status: She is alert and oriented to person, place, and time.  Psychiatric:        Mood and Affect: Mood normal.        Behavior: Behavior normal.         Assessment & Plan:  1.Complex regional pain syndrome right lower extremity postoperative. She's s/p post plantar fascial release. She continue with sensitivity to touch. She has diffuse numbness and tingling.Continue Gabapentin . Marland Kitchen 12/27/2022. Nucynta discontinued due to Urine Clearance. Refilled: Oxycodone 10 mg 4 times a day as needed for pain #120, .We will continue the opioid monitoring program, this consists of regular clinic visits, examinations, urine drug screen, pill counts as well as use of [place] Controlled Substance Reporting system. A 12 month History has been reviewed on the [place] Controlled Substance Reporting System on 12/27/2022. 2.Myofascial pain syndrome:  Continue current medication regime with Tizanidine and  ice, heat and exercise regime. 12/27/2022 3. Depression: Continue: Zoloft. PCP Following. 12/27/2022 4. Cervical Post Laminectomy: Continue to Monitor. 12/27/2022 5. Cervicalgia/Cervical  Radiculopathy/ Left shoulder, Left Arm and Left  Hand Pain with tingling,  Continue current medication regimen. Continue to monitor. 12/27/2022 6. Poly Neuropathy: Continue current medication regimen with Nortriptyline  and :Continue to Monitor. 12/27/2022 7. Left Shoulder Tendonitis: Chronic Left Shoulder Pain:  Ortho Following. Continue current medication regime and Continue  to Monitor. 12/27/2022 8. Right hand pain/ Post Surgical  Procedure: Trigger Finger Release: Dr. Ardyth Harps Following.  No complaints Today. 12/27/2022. 9. Tremor: No complaints today. Gabapentin being weaned slowly. Continue to monitor.  08/05//2024 10. Chronic Right   Knee Pain: Orthopedics Following: Continue with HEP as Tolerated. Continue to Monitor. 12/27/2022 11. Chronic Right  Foot Pain. Podiatry Following.Continue with HEP as tolerated. Continue current medication regimen. Continue to monitor. 12/27/2022  12. Left Greater Trochanter Bursitis: No complaints today. Continue to alternate Ice and Heat Therapy. Continue to Monitor. 12/27/2022  13. Right Lateral Epicondylitis:  No complaints today. Ortho Following. Continue to Monitor.  12/27/2022    F/U in 1 month

## 2023-01-25 NOTE — Progress Notes (Unsigned)
Subjective:    Patient ID: Sabrina Williams, female    DOB: 05-Jun-1970, 52 y.o.   MRN: [id number]  HPI: Sabrina Williams is a 52 y.o. female who returns for follow up appointment for chronic pain and medication refill. She states her pain is located in her left shoulder radiating into her left arm with tingling and burning, Right Knee pain and Right foot with tingling and burning. She also reports right hip pain for a week, denies falling, refuses X-ray at this time. Will call if she changes her mind. She rates her pain 3. Her current exercise regime is walking and performing stretching exercises.  Sabrina Williams Morphine equivalent is 60.00 MME.   UDS ordered today.      Pain Inventory Average Pain 4 Pain Right Now 3 My pain is constant, sharp, burning, dull, and stabbing  In the last 24 hours, has pain interfered with the following? General activity 7 Relation with others 5 Enjoyment of life 5 What TIME of day is your pain at its worst? night Sleep (in general) Fair  Pain is worse with: walking, standing, and some activites Pain improves with: rest, heat/ice, therapy/exercise, medication, and injections Relief from Meds: 7  Family History  Problem Relation Age of Onset   Cancer Mother        multiple myeloma   Diabetes Father    Kidney disease Father    Heart disease Father    Social History   Socioeconomic History   Marital status: Married    Spouse name: Not on file   Number of children: 2   Years of education: some college   Highest education level: Not on file  Occupational History   Occupation: homemaker  Tobacco Use   Smoking status: Never   Smokeless tobacco: Never  Vaping Use   Vaping status: Never Used  Substance and Sexual Activity   Alcohol use: No    Alcohol/week: 0.0 standard drinks of alcohol   Drug use: No   Sexual activity: Not on file  Other Topics Concern   Not on file  Social History Narrative   Lives at home with husband.   Right-handed.    No daily caffeine use.   Social Determinants of Health   Financial Resource Strain: Not on file  Food Insecurity: No Food Insecurity (06/21/2022)   Hunger Vital Sign    Worried About Running Out of Food in the Last Year: Never true    Ran Out of Food in the Last Year: Never true  Transportation Needs: No Transportation Needs (06/21/2022)   PRAPARE - Administrator, Civil Service (Medical): No    Lack of Transportation (Non-Medical): No  Physical Activity: Not on file  Stress: Not on file  Social Connections: Not on file   Past Surgical History:  Procedure Laterality Date   CERVICAL SPINE SURGERY  2005   C6-7 fusion   CESAREAN SECTION     x2   CYSTOSCOPY WITH RETROGRADE PYELOGRAM, URETEROSCOPY AND STENT PLACEMENT Left 06/21/2022   Procedure: CYSTOSCOPY WITH RETROGRADE PYELOGRAM, URETEROSCOPY AND STENT PLACEMENT;  Surgeon: Sabrina Scotland, MD;  Location: [facility];  Service: Urology;  Laterality: Left;   CYSTOSCOPY/URETEROSCOPY/HOLMIUM LASER/STENT PLACEMENT Left 07/06/2022   Procedure: CYSTOSCOPY/URETEROSCOPY/HOLMIUM LASER/STENT EXCHANGE;  Surgeon: Sabrina Altes, MD;  Location: [facility];  Service: Urology;  Laterality: Left;   dental implant  11/2013   ENDOSCOPIC PLANTAR FASCIOTOMY Right 12/2013   HAND SURGERY Right    KNEE ARTHROSCOPY  1985  LITHOTRIPSY     multiple   PARTIAL GASTRECTOMY  01/01/2016   PLANTAR FASCIECTOMY Right    SHOULDER SURGERY Left 10/2004   SMALL INTESTINE SURGERY     cervical laminectomy   utereroscopy  1995   Past Surgical History:  Procedure Laterality Date   CERVICAL SPINE SURGERY  2005   C6-7 fusion   CESAREAN SECTION     x2   CYSTOSCOPY WITH RETROGRADE PYELOGRAM, URETEROSCOPY AND STENT PLACEMENT Left 06/21/2022   Procedure: CYSTOSCOPY WITH RETROGRADE PYELOGRAM, URETEROSCOPY AND STENT PLACEMENT;  Surgeon: Sabrina Scotland, MD;  Location: [facility];  Service: Urology;  Laterality: Left;   CYSTOSCOPY/URETEROSCOPY/HOLMIUM LASER/STENT  PLACEMENT Left 07/06/2022   Procedure: CYSTOSCOPY/URETEROSCOPY/HOLMIUM LASER/STENT EXCHANGE;  Surgeon: Sabrina Altes, MD;  Location: [facility];  Service: Urology;  Laterality: Left;   dental implant  11/2013   ENDOSCOPIC PLANTAR FASCIOTOMY Right 12/2013   HAND SURGERY Right    KNEE ARTHROSCOPY  1985   LITHOTRIPSY     multiple   PARTIAL GASTRECTOMY  01/01/2016   PLANTAR FASCIECTOMY Right    SHOULDER SURGERY Left 10/2004   SMALL INTESTINE SURGERY     cervical laminectomy   utereroscopy  1995   Past Medical History:  Diagnosis Date   Alopecia    Anemia    Anxiety    Cervical radiculopathy at C6    Chronic kidney disease    stones   Chronic pain    Complex regional pain syndrome I    right foot   Depression    Diabetes mellitus type 2, uncomplicated (HCC)    GERD (gastroesophageal reflux disease)    Heart murmur    History of kidney stones    Hypercholesterolemia    Hypertension, essential, benign    Kidney stone    Morbid obesity (HCC)    Neuromuscular disorder (HCC)    Plantar fasciitis    Stage 4 chronic kidney disease (HCC)    Steatosis of liver    Tremor    Vitamin D deficiency    LMP 12/02/2019   Opioid Risk Score:   Fall Risk Score:  `1  Depression screen [facility] 2/9     12/27/2022   10:53 AM 12/01/2022   10:21 AM 08/31/2022    3:04 PM 07/28/2022    3:15 PM 05/18/2022    3:38 PM 03/22/2022    3:16 PM 01/19/2022    8:20 AM  Depression screen PHQ 2/9  Decreased Interest 0 0 0 0 0 0 0  Down, Depressed, Hopeless 0 0 0 0 0 0 0  PHQ - 2 Score 0 0 0 0 0 0 0    Review of Systems  Musculoskeletal:  Positive for back pain and neck pain.       Pain on the left side of the neck down to finger tips. Right knee pain, right foot pain,  All other systems reviewed and are negative.      Objective:   Physical Exam Vitals and nursing note reviewed.  Constitutional:      Appearance: Normal appearance. She is obese.  Cardiovascular:     Rate and Rhythm: Normal rate and  regular rhythm.     Pulses: Normal pulses.     Heart sounds: Normal heart sounds.  Pulmonary:     Effort: Pulmonary effort is normal.     Breath sounds: Normal breath sounds.  Musculoskeletal:     Cervical back: Normal range of motion and neck supple.     Comments: Normal Muscle Bulk and  Muscle Testing Reveals:  Upper Extremities: Full ROM and Muscle Strength 5/5 Lower Extremities: Full ROM and Muscle Strength 5/5 Arises from Table with ease Narrow Based  Gait     Skin:    General: Skin is warm and dry.  Neurological:     Mental Status: She is alert and oriented to person, place, and time.  Psychiatric:        Mood and Affect: Mood normal.        Behavior: Behavior normal.          Assessment & Plan:  1.Complex regional pain syndrome right lower extremity postoperative. She's s/p post plantar fascial release. She continue with sensitivity to touch. She has diffuse numbness and tingling.Continue Gabapentin . Marland Kitchen 01/27/2023. Nucynta discontinued due to Urine Clearance. Refilled: Oxycodone 10 mg 4 times a day as needed for pain #120, .We will continue the opioid monitoring program, this consists of regular clinic visits, examinations, urine drug screen, pill counts as well as use of [place] Controlled Substance Reporting system. A 12 month History has been reviewed on the [place] Controlled Substance Reporting System on 01/27/2023. 2.Myofascial pain syndrome: Continue current medication regime with Tizanidine and  ice, heat and exercise regime. 01/27/2023 3. Depression: Continue: Zoloft. PCP Following. 01/27/2023 4. Cervical Post Laminectomy: Continue to Monitor. 01/27/2023 5. Cervicalgia/Cervical  Radiculopathy/ Left shoulder, Left Arm and Left  Hand Pain with tingling,  Continue current medication regimen. Continue to monitor. 01/27/2023 6. Poly Neuropathy: Continue current medication regimen with Nortriptyline  and :Continue to Monitor. 01/27/2023 7. Left Shoulder  Tendonitis: Chronic Left Shoulder Pain:  Ortho Following. Continue current medication regime and Continue  to Monitor. 01/27/2023 8. Right hand pain/ Post Surgical  Procedure: Trigger Finger Release: Dr. Ardyth Harps Following.  No complaints Today. 01/27/2023. 9. Tremor: No complaints today. Gabapentin being weaned slowly. Continue to monitor.  09/05//2024 10. Chronic Right   Knee Pain: Orthopedics Following: Continue with HEP as Tolerated. Continue to Monitor. 01/27/2023 11. Chronic Right  Foot Pain. Podiatry Following.Continue with HEP as tolerated. Continue current medication regimen. Continue to monitor. 01/27/2023  12. Left Greater Trochanter Bursitis: No complaints today. Continue to alternate Ice and Heat Therapy. Continue to Monitor. 01/27/2023  13. Right Lateral Epicondylitis:  No complaints today. Ortho Following. Continue to Monitor.  01/27/2023    F/U in 1 month

## 2023-01-27 ENCOUNTER — Encounter: Payer: 59 | Attending: Physical Medicine & Rehabilitation | Admitting: Registered Nurse

## 2023-01-27 ENCOUNTER — Encounter: Payer: Self-pay | Admitting: Registered Nurse

## 2023-01-27 ENCOUNTER — Encounter: Payer: 59 | Admitting: Registered Nurse

## 2023-01-27 VITALS — BP 100/63 | HR 78 | Ht 67.0 in | Wt 254.0 lb

## 2023-01-27 DIAGNOSIS — M792 Neuralgia and neuritis, unspecified: Secondary | ICD-10-CM | POA: Insufficient documentation

## 2023-01-27 DIAGNOSIS — M1711 Unilateral primary osteoarthritis, right knee: Secondary | ICD-10-CM | POA: Insufficient documentation

## 2023-01-27 DIAGNOSIS — G8929 Other chronic pain: Secondary | ICD-10-CM | POA: Insufficient documentation

## 2023-01-27 DIAGNOSIS — G90521 Complex regional pain syndrome I of right lower limb: Secondary | ICD-10-CM | POA: Insufficient documentation

## 2023-01-27 DIAGNOSIS — Z5181 Encounter for therapeutic drug level monitoring: Secondary | ICD-10-CM | POA: Diagnosis not present

## 2023-01-27 DIAGNOSIS — M25512 Pain in left shoulder: Secondary | ICD-10-CM | POA: Insufficient documentation

## 2023-01-27 DIAGNOSIS — Z79891 Long term (current) use of opiate analgesic: Secondary | ICD-10-CM | POA: Insufficient documentation

## 2023-01-27 DIAGNOSIS — M25551 Pain in right hip: Secondary | ICD-10-CM | POA: Insufficient documentation

## 2023-01-27 DIAGNOSIS — G894 Chronic pain syndrome: Secondary | ICD-10-CM | POA: Insufficient documentation

## 2023-01-27 MED ORDER — OXYCODONE HCL 10 MG PO TABS: 10.0000 mg | ORAL_TABLET | Freq: Four times a day (QID) | ORAL | 0 refills | Status: DC | PRN

## 2023-01-30 ENCOUNTER — Other Ambulatory Visit: Payer: Self-pay | Admitting: Registered Nurse

## 2023-02-03 LAB — TOXASSURE SELECT,+ANTIDEPR,UR

## 2023-02-18 ENCOUNTER — Ambulatory Visit (INDEPENDENT_AMBULATORY_CARE_PROVIDER_SITE_OTHER): Payer: 59

## 2023-02-18 ENCOUNTER — Ambulatory Visit
Admission: EM | Admit: 2023-02-18 | Discharge: 2023-02-18 | Disposition: A | Discharge status: Home / Self Care | Payer: 59 | Attending: Physician Assistant | Admitting: Physician Assistant

## 2023-02-18 DIAGNOSIS — M79622 Pain in left upper arm: Secondary | ICD-10-CM

## 2023-02-18 DIAGNOSIS — M25522 Pain in left elbow: Secondary | ICD-10-CM | POA: Diagnosis not present

## 2023-02-18 DIAGNOSIS — W19XXXA Unspecified fall, initial encounter: Secondary | ICD-10-CM

## 2023-02-18 DIAGNOSIS — M25552 Pain in left hip: Secondary | ICD-10-CM

## 2023-02-18 DIAGNOSIS — M25512 Pain in left shoulder: Secondary | ICD-10-CM | POA: Diagnosis not present

## 2023-02-18 NOTE — Discharge Instructions (Signed)
-  Results of x-ray will be back tomorrow.  Cannot definitively rule out a fracture at this time - We placed you in a splint.  If there are fractures you will need to wear the splint until you are seen by orthopedics and cleared.  If there are no fractures seen you may still want to wear the splint for a few days and then you will be able to remove it yourself and just use a sling. - Elevate extremity.  Take home pain medication as needed. - Please follow-up with orthopedics in the next week if you do have a fracture.  I will contact you tomorrow with your results.

## 2023-02-18 NOTE — ED Triage Notes (Signed)
Patient fell yesterday. Hurt her left shoulder arm and back.

## 2023-02-18 NOTE — ED Provider Notes (Signed)
[facility]    CSN: [id number] Arrival date & time: 02/18/23  1755      History   Chief Complaint Chief Complaint  Patient presents with   Fall    HPI Sabrina Williams is a 52 y.o. female.  Medical history significant for anxiety, complex regional pain syndrome, type 2 diabetes, GERD, hypertension, hyperlipidemia, obesity, stage IV CKD, fatty liver.  Patient is on chronic narcotics and takes oxycodone daily.  Patient presents for multiple injuries following accidental fall yesterday.  Patient reports she hit her left side on a rail and then fell onto concrete.  She reports pain of her left shoulder/arm and left lower back/buttocks.  Reports pain of lateral left upper arm to elbow, radiating down forearm to left thumb. Has numbness but this is not new. Says she has history of "nerve damage from herniated disc." She says she has chronic neck pain on the left side with radiation to left shoulder--this is no worse than normal.  Reports contusion of the left upper arm and elbow.  She is also reporting left hip/buttocks pain. Is able to walk and bear weight without difficulty.  Full range of motion of her left hip without any significant discomfort.  Denies head injury or LOC.  No other injury sustained.  Patient is concerned for potential fracture of her left arm given her continued high level of pain despite taking pain medicine.  HPI  Past Medical History:  Diagnosis Date   Alopecia    Anemia    Anxiety    Cervical radiculopathy at C6    Chronic kidney disease    stones   Chronic pain    Complex regional pain syndrome I    right foot   Depression    Diabetes mellitus type 2, uncomplicated (HCC)    GERD (gastroesophageal reflux disease)    Heart murmur    History of kidney stones    Hypercholesterolemia    Hypertension, essential, benign    Kidney stone    Morbid obesity (HCC)    Neuromuscular disorder (HCC)    Plantar fasciitis    Stage 4 chronic kidney  disease (HCC)    Steatosis of liver    Tremor    Vitamin D deficiency     Patient Active Problem List   Diagnosis Date Noted   AKI (acute kidney injury) (HCC) 12/03/2021   Sleep paralysis, recurrent isolated 09/03/2021   Status post bariatric surgery 09/03/2021   Postural dizziness with presyncope 09/03/2021   Hypersomnia, periodic 09/03/2021   Class 3 severe obesity due to excess calories with body mass index (BMI) of 40.0 to 44.9 in adult (HCC) 09/03/2021   Obstructive sleep apnea 08/06/2021   Dizziness 08/06/2021   Achilles tendinitis 07/31/2021   Calcaneal spur of right foot 07/14/2021   Acquired pes planus of right foot 06/24/2021   Metatarsalgia of right foot 06/24/2021   Sprain of foot 06/24/2021   Gastroesophageal reflux disease without esophagitis 05/29/2021   Vagal nerve sensitivity 05/29/2021   Alopecia 01/02/2021   Anemia 01/02/2021   Elevated parathyroid hormone 01/02/2021   Hypomagnesemia 01/02/2021   Postprandial hypoglycemia 10/14/2020   Myoclonic jerking 04/02/2019   Benign essential hypertension 03/21/2019   Chronic renal failure, stage 3b (HCC) 03/21/2019   Acquired trigger finger 05/02/2018   Lump on finger 05/02/2018   Anxiety and depression 08/17/2017   Cervical radiculitis 06/03/2017   Heart murmur, systolic 02/27/2017   Alkaline phosphatase elevation 02/21/2017   History of weight loss surgery  02/17/2017   Vitamin D deficiency 11/24/2015   Steatosis of liver 11/14/2015   Diabetes mellitus (HCC) 09/23/2015   Pure hypercholesterolemia 09/23/2015   Multiple joint pain 07/07/2015   Muscle pain 07/07/2015   Right shoulder pain 12/05/2014   Complex regional pain syndrome type 1 of right lower extremity 07/05/2014   Increased frequency of urination 08/21/2013   Kidney stone 07/04/2012   Cervical postlaminectomy syndrome 09/07/2011   Cervical radiculopathy at C6 09/07/2011    Past Surgical History:  Procedure Laterality Date   CERVICAL SPINE  SURGERY  2005   C6-7 fusion   CESAREAN SECTION     x2   CYSTOSCOPY WITH RETROGRADE PYELOGRAM, URETEROSCOPY AND STENT PLACEMENT Left 06/21/2022   Procedure: CYSTOSCOPY WITH RETROGRADE PYELOGRAM, URETEROSCOPY AND STENT PLACEMENT;  Surgeon: Vanna Scotland, MD;  Location: [facility];  Service: Urology;  Laterality: Left;   CYSTOSCOPY/URETEROSCOPY/HOLMIUM LASER/STENT PLACEMENT Left 07/06/2022   Procedure: CYSTOSCOPY/URETEROSCOPY/HOLMIUM LASER/STENT EXCHANGE;  Surgeon: Riki Altes, MD;  Location: [facility];  Service: Urology;  Laterality: Left;   dental implant  11/2013   ENDOSCOPIC PLANTAR FASCIOTOMY Right 12/2013   HAND SURGERY Right    KNEE ARTHROSCOPY  1985   LITHOTRIPSY     multiple   PARTIAL GASTRECTOMY  01/01/2016   PLANTAR FASCIECTOMY Right    SHOULDER SURGERY Left 10/2004   SMALL INTESTINE SURGERY     cervical laminectomy   utereroscopy  1995    OB History   No obstetric history on file.      Home Medications    Prior to Admission medications   Medication Sig Start Date End Date Taking? Authorizing Provider  acetaminophen (TYLENOL) 500 MG tablet Take by mouth.    [provider]  benazepril (LOTENSIN) 20 MG tablet Take 20 mg by mouth daily. 12/06/21   [provider]  calcitRIOL (ROCALTROL) 0.5 MCG capsule Take 0.5 mcg by mouth in the morning and at bedtime. 02/02/20   [provider]  calcium carbonate 1250 MG capsule Take 1,250 mg by mouth daily.    [provider]  Cholecalciferol (VITAMIN D3 PO) Take 2,000 Units by mouth daily.    [provider]  DULoxetine (CYMBALTA) 60 MG capsule Take 60 mg by mouth daily.    [provider]  ferrous sulfate 325 (65 FE) MG tablet Take 325 mg by mouth daily with breakfast. 04/07/21   [provider]  Fingerstix Lancets MISC Use 1 each 2 (two) times daily As directed [Test blood sugars in rotating pattern:  fasting, before meals, 2 hours after a meal, at bedtime]     [provider]  gabapentin (NEURONTIN) 100 MG capsule TAKE 1 CAPSULE BY MOUTH EVERY DAY 07/13/22   Kirsteins, Victorino Sparrow, MD  glucose blood test strip USE 1 STRIP TWICE A DAY AS DIRECTED 02/17/15   [provider]  LAGEVRIO 200 MG CAPS capsule TAKE 4 CAPSULES (800 MG TOTAL) BY MOUTH TWICE A DAY FOR 5 DAYS 10/15/22   [provider]  lisinopril (ZESTRIL) 10 MG tablet Take 10 mg by mouth daily. 11/27/22   [provider]  lovastatin (MEVACOR) 20 MG tablet Take 20 mg by mouth every evening. 07/18/20   [provider]  Magnesium Glycinate 100 MG CAPS TAKE 5 CAPSULES (500 MG TOTAL) BY MOUTH ONCE DAILY 11/08/22   [provider]  magnesium oxide (MAG-OX) 400 MG tablet Take 1 tablet by mouth daily. 12/28/21   [provider]  Multiple Vitamins-Minerals (BARIATRIC MULTIVITAMINS/IRON PO)  [provider]  Multiple Vitamins-Minerals (BARIATRIC MULTIVITAMINS/IRON) CAPS Take 3 capsules by mouth daily before supper.    [provider]  nortriptyline (PAMELOR) 75 MG capsule TAKE 1 CAPSULE BY MOUTH EVERYDAY AT BEDTIME 09/02/22   Jones Bales, NP  omeprazole (PRILOSEC) 10 MG capsule Take 1 tablet by mouth daily. 12/06/22   [provider]  ondansetron (ZOFRAN) 4 MG tablet TAKE 1 TABLET (4 MG TOTAL) BY MOUTH 2 (TWO) TIMES DAILY AS NEEDED FOR NAUSEA OR VOMITING. 02/24/22   Jones Bales, NP  oxybutynin (DITROPAN-XL) 10 MG 24 hr tablet TAKE 1 TABLET BY MOUTH EVERYDAY AT BEDTIME 11/22/22   Vaillancourt, Samantha, PA-C  Oxycodone HCl 10 MG TABS Take 1 tablet (10 mg total) by mouth 4 (four) times daily as needed. 01/27/23   Jones Bales, NP  patiromer Lelon Perla) 8.4 g packet Take by mouth. 12/22/22 12/22/23  [provider]  tamsulosin (FLOMAX) 0.4 MG CAPS capsule Take 1 capsule (0.4 mg total) by mouth daily. 06/18/22   Stoioff, Verna Czech, MD  tiZANidine (ZANAFLEX) 4 MG tablet TAKE 1 TABLET (4 MG TOTAL) BY MOUTH 5 (FIVE) TIMES  DAILY AS NEEDED FOR MUSCLE SPASMS. 02/01/23   Jones Bales, NP  VITAMIN C, CALCIUM ASCORBATE, PO     [provider]  Vitamin D, Ergocalciferol, (DRISDOL) 1.25 MG (50000 UNIT) CAPS capsule TAKE 1 CAPSULE (50,000 UNITS TOTAL) BY MOUTH TWICE A WEEK FOR 30 DAYS 11/25/22   [provider]    Family History Family History  Problem Relation Age of Onset   Cancer Mother        multiple myeloma   Diabetes Father    Kidney disease Father    Heart disease Father     Social History Social History   Tobacco Use   Smoking status: Never   Smokeless tobacco: Never  Vaping Use   Vaping status: Never Used  Substance Use Topics   Alcohol use: No    Alcohol/week: 0.0 standard drinks of alcohol   Drug use: No     Allergies   Penicillins   Review of Systems Review of Systems  Respiratory:  Negative for shortness of breath.   Cardiovascular:  Negative for chest pain.  Musculoskeletal:  Positive for arthralgias, back pain and neck pain (chronic). Negative for gait problem and joint swelling.  Skin:  Positive for color change. Negative for wound.  Neurological:  Negative for dizziness, syncope, weakness, numbness and headaches.     Physical Exam Triage Vital Signs ED Triage Vitals  Encounter Vitals Group     BP      Systolic BP Percentile      Diastolic BP Percentile      Pulse      Resp      Temp      Temp src      SpO2      Weight      Height      Head Circumference      Peak Flow      Pain Score      Pain Loc      Pain Education      Exclude from Growth Chart    No data found.  Updated Vital Signs BP 133/82 (BP Location: Right Arm)   Pulse 85   Temp 98.4 F (36.9 C) (Oral)   Resp 17   Wt 250 lb (113.4 kg)   LMP 12/02/2019   SpO2 100%   BMI 39.16 kg/m  Physical Exam Vitals and nursing note reviewed.  Constitutional:      General: She is not in acute distress.    Appearance: Normal appearance. She is not ill-appearing or  toxic-appearing.  HENT:     Head: Normocephalic and atraumatic.  Eyes:     General: No scleral icterus.       Right eye: No discharge.        Left eye: No discharge.     Conjunctiva/sclera: Conjunctivae normal.  Cardiovascular:     Rate and Rhythm: Normal rate and regular rhythm.     Heart sounds: Normal heart sounds.  Pulmonary:     Effort: Pulmonary effort is normal. No respiratory distress.     Breath sounds: Normal breath sounds.  Musculoskeletal:     Cervical back: Neck supple.     Comments: Left arm: There is a large contusion and hematoma of the left posterior and lateral upper arm to the elbow.  Small contusions around the elbow posteriorly.  Tenderness of the proximal humerus, midshaft of humerus, lateral epicondyles of elbow.  Very mild tenderness of the proximal ulna.  No tenderness of mid forearm to wrist or hand.  Reduced range of motion of shoulder and elbow.  Back/buttocks: There is no spinal tenderness.  There is mild TTP of the left buttocks and lateral hip but full range of motion of hip seemingly without discomfort.    Skin:    General: Skin is dry.  Neurological:     General: No focal deficit present.     Mental Status: She is alert. Mental status is at baseline.     Motor: No weakness.     Gait: Gait normal.  Psychiatric:        Mood and Affect: Mood normal.        Behavior: Behavior normal.        Thought Content: Thought content normal.      UC Treatments / Results  Labs (all labs ordered are listed, but only abnormal results are displayed) Labs Reviewed - No data to display  EKG   Radiology No results found.  Procedures Procedures (including critical care time)  Medications Ordered in UC Medications - No data to display  Initial Impression / Assessment and Plan / UC Course  I have reviewed the triage vital signs and the nursing notes.  Pertinent labs & imaging results that were available during my care of the patient were reviewed by me  and considered in my medical decision making (see chart for details).   52 y/o female with history of anxiety, complex regional pain syndrome, type 2 diabetes, GERD, hypertension, hyperlipidemia, obesity, stage IV CKD, fatty liver, and chronic opioid use presents for injuries following accidental fall yesterday.  Patient reporting pain of the left shoulder, left upper arm, left elbow, left buttocks and left hip.  She has reduced range of motion of the left shoulder and left elbow.  Forage motion of her hip without any spinal tenderness or bony tenderness of the hip.  Walking and weightbearing like normal.  X-rays of left shoulder, left humerus and left elbow obtained.  *** Final Clinical Impressions(s) / UC Diagnoses   Final diagnoses:  None   Discharge Instructions   None    ED Prescriptions   None    I have reviewed the PDMP during this encounter.

## 2023-02-23 ENCOUNTER — Encounter: Payer: Self-pay | Admitting: Registered Nurse

## 2023-02-23 ENCOUNTER — Encounter: Payer: 59 | Attending: Registered Nurse | Admitting: Registered Nurse

## 2023-02-23 VITALS — BP 129/85 | HR 78 | Ht 67.0 in | Wt 263.0 lb

## 2023-02-23 DIAGNOSIS — G90521 Complex regional pain syndrome I of right lower limb: Secondary | ICD-10-CM | POA: Diagnosis not present

## 2023-02-23 DIAGNOSIS — M7592 Shoulder lesion, unspecified, left shoulder: Secondary | ICD-10-CM | POA: Diagnosis not present

## 2023-02-23 DIAGNOSIS — M545 Low back pain, unspecified: Secondary | ICD-10-CM

## 2023-02-23 DIAGNOSIS — M25512 Pain in left shoulder: Secondary | ICD-10-CM | POA: Diagnosis not present

## 2023-02-23 DIAGNOSIS — G629 Polyneuropathy, unspecified: Secondary | ICD-10-CM | POA: Insufficient documentation

## 2023-02-23 DIAGNOSIS — Z5181 Encounter for therapeutic drug level monitoring: Secondary | ICD-10-CM

## 2023-02-23 DIAGNOSIS — M7918 Myalgia, other site: Secondary | ICD-10-CM | POA: Diagnosis not present

## 2023-02-23 DIAGNOSIS — M1711 Unilateral primary osteoarthritis, right knee: Secondary | ICD-10-CM

## 2023-02-23 DIAGNOSIS — M79641 Pain in right hand: Secondary | ICD-10-CM | POA: Diagnosis not present

## 2023-02-23 DIAGNOSIS — M7062 Trochanteric bursitis, left hip: Secondary | ICD-10-CM | POA: Diagnosis not present

## 2023-02-23 DIAGNOSIS — M792 Neuralgia and neuritis, unspecified: Secondary | ICD-10-CM

## 2023-02-23 DIAGNOSIS — M5412 Radiculopathy, cervical region: Secondary | ICD-10-CM | POA: Diagnosis not present

## 2023-02-23 DIAGNOSIS — Z79891 Long term (current) use of opiate analgesic: Secondary | ICD-10-CM | POA: Insufficient documentation

## 2023-02-23 DIAGNOSIS — M7711 Lateral epicondylitis, right elbow: Secondary | ICD-10-CM | POA: Insufficient documentation

## 2023-02-23 DIAGNOSIS — G894 Chronic pain syndrome: Secondary | ICD-10-CM

## 2023-02-23 DIAGNOSIS — M79671 Pain in right foot: Secondary | ICD-10-CM | POA: Diagnosis not present

## 2023-02-23 DIAGNOSIS — F32A Depression, unspecified: Secondary | ICD-10-CM | POA: Insufficient documentation

## 2023-02-23 DIAGNOSIS — M542 Cervicalgia: Secondary | ICD-10-CM | POA: Diagnosis not present

## 2023-02-23 DIAGNOSIS — M25561 Pain in right knee: Secondary | ICD-10-CM | POA: Diagnosis not present

## 2023-02-23 DIAGNOSIS — W19XXXD Unspecified fall, subsequent encounter: Secondary | ICD-10-CM | POA: Insufficient documentation

## 2023-02-23 DIAGNOSIS — G8929 Other chronic pain: Secondary | ICD-10-CM

## 2023-02-23 MED ORDER — OXYCODONE HCL 15 MG PO TABS: 15.0000 mg | ORAL_TABLET | Freq: Four times a day (QID) | ORAL | 0 refills | Status: DC | PRN

## 2023-02-23 NOTE — Progress Notes (Signed)
Subjective:    Patient ID: Sabrina Williams, female    DOB: 11/07/1970, 52 y.o.   MRN: [id number]  HPI: Sabrina Williams is a 52 y.o. female who returns for follow up appointment for chronic pain and medication refill. She states her pain is located in her neck radiating into her left shoulder, left arm and left elbow radiating into her left hand. She also reports lower back pain and bilateral feet with tingling and burning.   [name] reports she had a fall last week Thursday, she was coming out of a business and missed a step, fell against a rail and landed on her left side. A pedestrian helped her up. She was seen in Urgent Care on 02/18/2023, note was reviewed. She was seen by Ortho on 02/22/2023. She is wearing left arm sling. Also reports increase intensity of left arm pain without any relief of her pain with current medication regimen. We had increased her Oxycodone to 5 times a day on yesterday, she received three hours of relief  with medication changed.   DG: Left Humerus:  IMPRESSION: 1. No acute bony abnormality. 2. Subcutaneous edema in the region of the posterior elbow.  DG: Left Elbow:  IMPRESSION: 1. Joint effusion. While no definite fracture is visible, appearance of the radial neck raises the question of possible occult fracture. Follow-up MRI of the elbow may prove helpful to evaluate for fracture/marrow edema or soft tissue derangement as the source of the joint effusion.  DG: Left Shoulder IMPRESSION: Degenerative changes in the glenoid without acute bony findings.  She rates her pain 6. Her current exercise regime is walking short distances at this time since fall.   [name] Morphine equivalent is 60.00 MME.   Last UDS was performed on 01/27/2023, it was consistent.     Pain Inventory Average Pain 7 Pain Right Now 6 My pain is constant, sharp, burning, tingling, and aching  In the last 24 hours, has pain interfered with the following? General  activity 9 Relation with others 8 Enjoyment of life 9 What TIME of day is your pain at its worst? evening and night Sleep (in general) Fair  Pain is worse with: walking, sitting, standing, and some activites Pain improves with: rest and medication Relief from Meds: 5  Family History  Problem Relation Age of Onset   Cancer Mother        multiple myeloma   Diabetes Father    Kidney disease Father    Heart disease Father    Social History   Socioeconomic History   Marital status: Married    Spouse name: Not on file   Number of children: 2   Years of education: some college   Highest education level: Not on file  Occupational History   Occupation: homemaker  Tobacco Use   Smoking status: Never   Smokeless tobacco: Never  Vaping Use   Vaping status: Never Used  Substance and Sexual Activity   Alcohol use: No    Alcohol/week: 0.0 standard drinks of alcohol   Drug use: No   Sexual activity: Not on file  Other Topics Concern   Not on file  Social History Narrative   Lives at home with husband.   Right-handed.   No daily caffeine use.   Social Determinants of Health   Financial Resource Strain: Not on file  Food Insecurity: No Food Insecurity (06/21/2022)   Hunger Vital Sign    Worried About Running Out of Food in the  Last Year: Never true    Ran Out of Food in the Last Year: Never true  Transportation Needs: No Transportation Needs (06/21/2022)   PRAPARE - Administrator, Civil Service (Medical): No    Lack of Transportation (Non-Medical): No  Physical Activity: Not on file  Stress: Not on file  Social Connections: Not on file   Past Surgical History:  Procedure Laterality Date   CERVICAL SPINE SURGERY  2005   C6-7 fusion   CESAREAN SECTION     x2   CYSTOSCOPY WITH RETROGRADE PYELOGRAM, URETEROSCOPY AND STENT PLACEMENT Left 06/21/2022   Procedure: CYSTOSCOPY WITH RETROGRADE PYELOGRAM, URETEROSCOPY AND STENT PLACEMENT;  Surgeon: Vanna Scotland, MD;   Location: [facility];  Service: Urology;  Laterality: Left;   CYSTOSCOPY/URETEROSCOPY/HOLMIUM LASER/STENT PLACEMENT Left 07/06/2022   Procedure: CYSTOSCOPY/URETEROSCOPY/HOLMIUM LASER/STENT EXCHANGE;  Surgeon: Riki Altes, MD;  Location: [facility];  Service: Urology;  Laterality: Left;   dental implant  11/2013   ENDOSCOPIC PLANTAR FASCIOTOMY Right 12/2013   HAND SURGERY Right    KNEE ARTHROSCOPY  1985   LITHOTRIPSY     multiple   PARTIAL GASTRECTOMY  01/01/2016   PLANTAR FASCIECTOMY Right    SHOULDER SURGERY Left 10/2004   SMALL INTESTINE SURGERY     cervical laminectomy   utereroscopy  1995   Past Surgical History:  Procedure Laterality Date   CERVICAL SPINE SURGERY  2005   C6-7 fusion   CESAREAN SECTION     x2   CYSTOSCOPY WITH RETROGRADE PYELOGRAM, URETEROSCOPY AND STENT PLACEMENT Left 06/21/2022   Procedure: CYSTOSCOPY WITH RETROGRADE PYELOGRAM, URETEROSCOPY AND STENT PLACEMENT;  Surgeon: Vanna Scotland, MD;  Location: [facility];  Service: Urology;  Laterality: Left;   CYSTOSCOPY/URETEROSCOPY/HOLMIUM LASER/STENT PLACEMENT Left 07/06/2022   Procedure: CYSTOSCOPY/URETEROSCOPY/HOLMIUM LASER/STENT EXCHANGE;  Surgeon: Riki Altes, MD;  Location: [facility];  Service: Urology;  Laterality: Left;   dental implant  11/2013   ENDOSCOPIC PLANTAR FASCIOTOMY Right 12/2013   HAND SURGERY Right    KNEE ARTHROSCOPY  1985   LITHOTRIPSY     multiple   PARTIAL GASTRECTOMY  01/01/2016   PLANTAR FASCIECTOMY Right    SHOULDER SURGERY Left 10/2004   SMALL INTESTINE SURGERY     cervical laminectomy   utereroscopy  1995   Past Medical History:  Diagnosis Date   Alopecia    Anemia    Anxiety    Cervical radiculopathy at C6    Chronic kidney disease    stones   Chronic pain    Complex regional pain syndrome I    right foot   Depression    Diabetes mellitus type 2, uncomplicated (HCC)    GERD (gastroesophageal reflux disease)    Heart murmur    History of kidney stones     Hypercholesterolemia    Hypertension, essential, benign    Kidney stone    Morbid obesity (HCC)    Neuromuscular disorder (HCC)    Plantar fasciitis    Stage 4 chronic kidney disease (HCC)    Steatosis of liver    Tremor    Vitamin D deficiency    LMP 12/02/2019   Opioid Risk Score:   Fall Risk Score:  `1  Depression screen [facility] 2/9     01/27/2023    9:23 AM 12/27/2022   10:53 AM 12/01/2022   10:21 AM 08/31/2022    3:04 PM 07/28/2022    3:15 PM 05/18/2022    3:38 PM 03/22/2022    3:16 PM  Depression screen  PHQ 2/9  Decreased Interest 0 0 0 0 0 0 0  Down, Depressed, Hopeless 0 0 0 0 0 0 0  PHQ - 2 Score 0 0 0 0 0 0 0     Review of Systems  Constitutional: Negative.   Eyes: Negative.   Respiratory: Negative.    Cardiovascular:  Positive for leg swelling.  Gastrointestinal: Negative.   Endocrine: Negative.   Genitourinary: Negative.   Musculoskeletal:        Left arm bilateral knees and feet, left hip  Skin: Negative.   Allergic/Immunologic: Negative.   Neurological: Negative.   Hematological: Negative.   Psychiatric/Behavioral:  Positive for dysphoric mood.   All other systems reviewed and are negative.      Objective:   Physical Exam Vitals and nursing note reviewed.  Constitutional:      Appearance: Normal appearance. She is obese.  Cardiovascular:     Rate and Rhythm: Normal rate and regular rhythm.     Pulses: Normal pulses.     Heart sounds: Normal heart sounds.  Pulmonary:     Effort: Pulmonary effort is normal.     Breath sounds: Normal breath sounds.  Musculoskeletal:     Cervical back: Normal range of motion and neck supple.     Comments: Normal Muscle Bulk and Muscle Testing Reveals:  Upper Extremities: Right: Full ROM and Muscle Strength  5/5 Left Upper Extremity: Decreased ROM 45 Degrees and Muscle Strength 4/5  Wearing left Arm sling Left Greater Trochanter Tenderness  Lumbar Paraspinal Tenderness: L-3-L-5 Lower Extremities: Full ROM and Muscle  Strength 5/5 Arises from Chair slowly  Antalgic  Gait     Skin:    General: Skin is warm and dry.  Neurological:     Mental Status: She is alert and oriented to person, place, and time.  Psychiatric:        Mood and Affect: Mood normal.        Behavior: Behavior normal.          Assessment & Plan:  1.Complex regional pain syndrome right lower extremity postoperative. She's s/p post plantar fascial release. She continue with sensitivity to touch. She has diffuse numbness and tingling.Continue Gabapentin . Marland Kitchen 02/23/2023. Nucynta discontinued due to Urine Clearance. .We will continue the opioid monitoring program, this consists of regular clinic visits, examinations, urine drug screen, pill counts as well as use of [place] Controlled Substance Reporting system. A 12 month History has been reviewed on the [place] Controlled Substance Reporting System on 02/23/2023. 2.Myofascial pain syndrome: Continue current medication regime with Tizanidine and  ice, heat and exercise regime. 02/23/2023 3. Depression: Continue: Zoloft. PCP Following. 02/23/2023 4. Cervical Post Laminectomy: Continue to Monitor. 02/23/2023 5. Cervicalgia/Cervical  Radiculopathy/ Left shoulder, Left Arm and Left  Hand Pain with tingling,  . Continue to monitor. 02/23/2023 6. Poly Neuropathy: Continue current medication regimen with Nortriptyline  and :Continue to Monitor. 02/23/2023 7. Left Shoulder Tendonitis: Chronic Left Shoulder Pain:  Ortho Following. Continue current medication regime and Continue  to Monitor. 02/23/2023 8. Right hand pain/ Post Surgical  Procedure: Trigger Finger Release: Dr. Ardyth Harps Following.  No complaints Today. 01/27/2023. 9. Tremor: No complaints today. Gabapentin being weaned slowly. Continue to monitor.  10/02//2024 10. Chronic Right   Knee Pain: Orthopedics Following: Continue with HEP as Tolerated. Continue to Monitor. 02/23/2023 11. Chronic Right  Foot Pain. Podiatry  Following.Continue with HEP as tolerated. Continue current medication regimen. Continue to monitor. 01/27/2023  12. Left Greater Trochanter Bursitis:  No complaints today. Continue to alternate Ice and Heat Therapy. Continue to Monitor. 02/23/2023  13. Right Lateral Epicondylitis:  No complaints today. Ortho Following. Continue to Monitor.  02/23/2023  14/ Fall subsequent encounter/ Increase intensity of pain. Oxycodone 10 mg discontinue. RX: Oxycodone 15 mg one tablet 4 times a day as needed for pain. #120. We will continue the opioid monitoring program, this consists of regular clinic visits, examinations, urine drug screen, pill counts as well as use of [place] Controlled Substance Reporting system. A 12 month History has been reviewed on the [place] Controlled Substance Reporting System on 02/23/2023.   F/U in 1 month

## 2023-03-23 ENCOUNTER — Telehealth: Payer: Self-pay | Admitting: Registered Nurse

## 2023-03-23 ENCOUNTER — Encounter: Payer: Self-pay | Admitting: Registered Nurse

## 2023-03-23 ENCOUNTER — Encounter (HOSPITAL_BASED_OUTPATIENT_CLINIC_OR_DEPARTMENT_OTHER): Payer: 59 | Admitting: Registered Nurse

## 2023-03-23 VITALS — BP 90/59 | HR 84 | Ht 67.0 in | Wt 264.0 lb

## 2023-03-23 DIAGNOSIS — M5412 Radiculopathy, cervical region: Secondary | ICD-10-CM | POA: Diagnosis not present

## 2023-03-23 DIAGNOSIS — M25512 Pain in left shoulder: Secondary | ICD-10-CM | POA: Diagnosis not present

## 2023-03-23 DIAGNOSIS — Z79891 Long term (current) use of opiate analgesic: Secondary | ICD-10-CM

## 2023-03-23 DIAGNOSIS — M542 Cervicalgia: Secondary | ICD-10-CM | POA: Diagnosis not present

## 2023-03-23 DIAGNOSIS — G90521 Complex regional pain syndrome I of right lower limb: Secondary | ICD-10-CM

## 2023-03-23 DIAGNOSIS — Z5181 Encounter for therapeutic drug level monitoring: Secondary | ICD-10-CM

## 2023-03-23 DIAGNOSIS — M1711 Unilateral primary osteoarthritis, right knee: Secondary | ICD-10-CM

## 2023-03-23 DIAGNOSIS — M792 Neuralgia and neuritis, unspecified: Secondary | ICD-10-CM

## 2023-03-23 DIAGNOSIS — G8929 Other chronic pain: Secondary | ICD-10-CM

## 2023-03-23 DIAGNOSIS — G894 Chronic pain syndrome: Secondary | ICD-10-CM

## 2023-03-23 MED ORDER — OXYCODONE HCL 15 MG PO TABS: 15.0000 mg | ORAL_TABLET | Freq: Four times a day (QID) | ORAL | 0 refills | Status: DC | PRN

## 2023-03-23 NOTE — Progress Notes (Signed)
Subjective:    Patient ID: Sabrina Williams, female    DOB: [birth year], 52 y.o.   MRN: [id number]  HPI: Sabrina Williams is a 52 y.o. female who returns for follow up appointment for chronic pain and medication refill. She states her pain is located in her  neck radiating into her left arm, also reports left arm pain and Ortho following. Also states she has pain in her right knee and bilateral feet with tingling and burning. Reports her pain is controlled with current medication regimen, we will continue to monitor, she verbalizes understanding. She rates her pain 2. Her current exercise regime is walking and performing stretching exercises.  Sabrina Williams Morphine equivalent is 90.00 MME.   Last UDS was Performed on 01/28/2023, it was consistent.     Pain Inventory Average Pain 4 Pain Right Now 2 My pain is constant, sharp, burning, dull, tingling, and aching  In the last 24 hours, has pain interfered with the following? General activity 7 Relation with others 5 Enjoyment of life 5 What TIME of day is your pain at its worst? evening Sleep (in general) Fair  Pain is worse with: walking, standing, and some activites Pain improves with: rest, therapy/exercise, and medication Relief from Meds: 7  Family History  Problem Relation Age of Onset   Cancer Mother        multiple myeloma   Diabetes Father    Kidney disease Father    Heart disease Father    Social History   Socioeconomic History   Marital status: Married    Spouse name: Not on file   Number of children: 2   Years of education: some college   Highest education level: Not on file  Occupational History   Occupation: homemaker  Tobacco Use   Smoking status: Never   Smokeless tobacco: Never  Vaping Use   Vaping status: Never Used  Substance and Sexual Activity   Alcohol use: No    Alcohol/week: 0.0 standard drinks of alcohol   Drug use: No   Sexual activity: Not on file  Other Topics Concern   Not on file   Social History Narrative   Lives at home with husband.   Right-handed.   No daily caffeine use.   Social Determinants of Health   Financial Resource Strain: Not on file  Food Insecurity: No Food Insecurity (06/21/2022)   Hunger Vital Sign    Worried About Running Out of Food in the Last Year: Never true    Ran Out of Food in the Last Year: Never true  Transportation Needs: No Transportation Needs (06/21/2022)   PRAPARE - Administrator, Civil Service (Medical): No    Lack of Transportation (Non-Medical): No  Physical Activity: Not on file  Stress: Not on file  Social Connections: Not on file   Past Surgical History:  Procedure Laterality Date   CERVICAL SPINE SURGERY  2005   C6-7 fusion   CESAREAN SECTION     x2   CYSTOSCOPY WITH RETROGRADE PYELOGRAM, URETEROSCOPY AND STENT PLACEMENT Left 06/21/2022   Procedure: CYSTOSCOPY WITH RETROGRADE PYELOGRAM, URETEROSCOPY AND STENT PLACEMENT;  Surgeon: Vanna Scotland, MD;  Location: [facility];  Service: Urology;  Laterality: Left;   CYSTOSCOPY/URETEROSCOPY/HOLMIUM LASER/STENT PLACEMENT Left 07/06/2022   Procedure: CYSTOSCOPY/URETEROSCOPY/HOLMIUM LASER/STENT EXCHANGE;  Surgeon: Riki Altes, MD;  Location: [facility];  Service: Urology;  Laterality: Left;   dental implant  11/2013   ENDOSCOPIC PLANTAR FASCIOTOMY Right 12/2013   HAND SURGERY Right  KNEE ARTHROSCOPY  1985   LITHOTRIPSY     multiple   PARTIAL GASTRECTOMY  01/01/2016   PLANTAR FASCIECTOMY Right    SHOULDER SURGERY Left 10/2004   SMALL INTESTINE SURGERY     cervical laminectomy   utereroscopy  1995   Past Surgical History:  Procedure Laterality Date   CERVICAL SPINE SURGERY  2005   C6-7 fusion   CESAREAN SECTION     x2   CYSTOSCOPY WITH RETROGRADE PYELOGRAM, URETEROSCOPY AND STENT PLACEMENT Left 06/21/2022   Procedure: CYSTOSCOPY WITH RETROGRADE PYELOGRAM, URETEROSCOPY AND STENT PLACEMENT;  Surgeon: Vanna Scotland, MD;  Location: [facility];  Service:  Urology;  Laterality: Left;   CYSTOSCOPY/URETEROSCOPY/HOLMIUM LASER/STENT PLACEMENT Left 07/06/2022   Procedure: CYSTOSCOPY/URETEROSCOPY/HOLMIUM LASER/STENT EXCHANGE;  Surgeon: Riki Altes, MD;  Location: [facility];  Service: Urology;  Laterality: Left;   dental implant  11/2013   ENDOSCOPIC PLANTAR FASCIOTOMY Right 12/2013   HAND SURGERY Right    KNEE ARTHROSCOPY  1985   LITHOTRIPSY     multiple   PARTIAL GASTRECTOMY  01/01/2016   PLANTAR FASCIECTOMY Right    SHOULDER SURGERY Left 10/2004   SMALL INTESTINE SURGERY     cervical laminectomy   utereroscopy  1995   Past Medical History:  Diagnosis Date   Alopecia    Anemia    Anxiety    Cervical radiculopathy at C6    Chronic kidney disease    stones   Chronic pain    Complex regional pain syndrome I    right foot   Depression    Diabetes mellitus type 2, uncomplicated (HCC)    GERD (gastroesophageal reflux disease)    Heart murmur    History of kidney stones    Hypercholesterolemia    Hypertension, essential, benign    Kidney stone    Morbid obesity (HCC)    Neuromuscular disorder (HCC)    Plantar fasciitis    Stage 4 chronic kidney disease (HCC)    Steatosis of liver    Tremor    Vitamin D deficiency    BP (!) 90/59   Pulse 84   Ht 5\' 7"  (1.702 m)   Wt 264 lb (119.7 kg)   LMP 12/02/2019   SpO2 98%   BMI 41.35 kg/m   Opioid Risk Score:   Fall Risk Score:  `1  Depression screen [facility] 2/9     02/23/2023    8:47 AM 01/27/2023    9:23 AM 12/27/2022   10:53 AM 12/01/2022   10:21 AM 08/31/2022    3:04 PM 07/28/2022    3:15 PM 05/18/2022    3:38 PM  Depression screen PHQ 2/9  Decreased Interest 1 0 0 0 0 0 0  Down, Depressed, Hopeless 1 0 0 0 0 0 0  PHQ - 2 Score 2 0 0 0 0 0 0   7  Review of Systems  Musculoskeletal:        Bilateral knee pain Right foot pain Bilateral heel pain Left arm pain  All other systems reviewed and are negative.     Objective:   Physical Exam Vitals and nursing note reviewed.   Constitutional:      Appearance: Normal appearance. She is obese.  Cardiovascular:     Rate and Rhythm: Normal rate and regular rhythm.     Pulses: Normal pulses.     Heart sounds: Normal heart sounds.  Pulmonary:     Effort: Pulmonary effort is normal.     Breath sounds: Normal breath  sounds.  Musculoskeletal:     Cervical back: Normal range of motion and neck supple.  Skin:    General: Skin is warm and dry.  Neurological:     Mental Status: She is alert and oriented to person, place, and time.  Psychiatric:        Mood and Affect: Mood normal.        Behavior: Behavior normal.           Assessment & Plan:  1.Complex regional pain syndrome right lower extremity postoperative. She's s/p post plantar fascial release. She continue with sensitivity to touch. She has diffuse numbness and tingling.Continue Gabapentin . Marland Kitchen 03/23/2023. Nucynta discontinued due to Urine Clearance. .We will continue the opioid monitoring program, this consists of regular clinic visits, examinations, urine drug screen, pill counts as well as use of [place] Controlled Substance Reporting system. A 12 month History has been reviewed on the [place] Controlled Substance Reporting System on 03/23/2023. 2.Myofascial pain syndrome: Continue current medication regime with Tizanidine and  ice, heat and exercise regime. 03/23/2023 3. Depression: Continue: Zoloft. PCP Following. 03/23/2023 4. Cervical Post Laminectomy: Continue to Monitor. 03/23/2023 5. Cervicalgia/Cervical  Radiculopathy/ Left shoulder, Left Arm and Left  Hand Pain with tingling,  . Continue to monitor. 03/23/2023 6. Poly Neuropathy: Continue current medication regimen with Nortriptyline  and :Continue to Monitor. 1030/2024 7. Left Shoulder Tendonitis: Chronic Left Shoulder Pain:  Ortho Following. Continue current medication regime and Continue  to Monitor. 03/23/2023 8. Right hand pain/ Post Surgical  Procedure: Trigger Finger Release:  Dr. Ardyth Harps Following.  No complaints Today. 03/23/2023. 9. Tremor: No complaints today. Gabapentin being weaned slowly. Continue to monitor.  03/23/2023 10. Chronic Right   Knee Pain: Orthopedics Following: Continue with HEP as Tolerated. Continue to Monitor. 03/23/2023 11. Chronic Right  Foot Pain. Podiatry Following.Continue with HEP as tolerated. Continue current medication regimen. Continue to monitor. 03/23/2023  12. Left Greater Trochanter Bursitis: No complaints today. Continue to alternate Ice and Heat Therapy. Continue to Monitor. 03/23/2023  13. Right Lateral Epicondylitis:  No complaints today. Ortho Following. Continue to Monitor.  03/23/2023  14/ Fall subsequent encounter/ No falls: . Refilled : Oxycodone 15 mg one tablet 4 times a day as needed for pain. #120. We will re-evaluate next month, slow weaning, she verbalizes understanding. With a goal to resume Oxycodone 10 mg 4 times a day as needed for pain. We will continue the opioid monitoring program, this consists of regular clinic visits, examinations, urine drug screen, pill counts as well as use of [place] Controlled Substance Reporting system. A 12 month History has been reviewed on the [place] Controlled Substance Reporting System on 03/23/2023.   F/U in 1 month

## 2023-03-23 NOTE — Telephone Encounter (Signed)
Call placed to Sabrina Williams to discuss [place], no answer. Left message to return the call.

## 2023-04-18 ENCOUNTER — Encounter: Payer: 59 | Attending: Registered Nurse | Admitting: Registered Nurse

## 2023-04-18 VITALS — BP 104/73 | HR 73 | Ht 67.0 in | Wt 266.0 lb

## 2023-04-18 DIAGNOSIS — M1711 Unilateral primary osteoarthritis, right knee: Secondary | ICD-10-CM

## 2023-04-18 DIAGNOSIS — M792 Neuralgia and neuritis, unspecified: Secondary | ICD-10-CM | POA: Diagnosis present

## 2023-04-18 DIAGNOSIS — M545 Low back pain, unspecified: Secondary | ICD-10-CM | POA: Diagnosis present

## 2023-04-18 DIAGNOSIS — G8929 Other chronic pain: Secondary | ICD-10-CM | POA: Insufficient documentation

## 2023-04-18 DIAGNOSIS — M5412 Radiculopathy, cervical region: Secondary | ICD-10-CM

## 2023-04-18 DIAGNOSIS — M25522 Pain in left elbow: Secondary | ICD-10-CM

## 2023-04-18 DIAGNOSIS — M25512 Pain in left shoulder: Secondary | ICD-10-CM | POA: Diagnosis present

## 2023-04-18 DIAGNOSIS — G90521 Complex regional pain syndrome I of right lower limb: Secondary | ICD-10-CM

## 2023-04-18 DIAGNOSIS — M542 Cervicalgia: Secondary | ICD-10-CM

## 2023-04-18 DIAGNOSIS — M1712 Unilateral primary osteoarthritis, left knee: Secondary | ICD-10-CM

## 2023-04-18 MED ORDER — OXYCODONE HCL 15 MG PO TABS: 15.0000 mg | ORAL_TABLET | Freq: Four times a day (QID) | ORAL | 0 refills | Status: DC | PRN

## 2023-04-18 NOTE — Progress Notes (Signed)
Subjective:    Patient ID: Sabrina Williams, female    DOB: 09/09/1970, 52 y.o.   MRN: [id number]  HPI: Sabrina Williams is a 52 y.o. female who returns for follow up appointment for chronic pain and medication refill. She states her pain is located in her neck radiating into her left shoulder and left arm with numbness and tingling. Also reports  bilateral knee pain R>L and bilateral feet pain with tingling and numbness. She rates her pain 2. Her current exercise regime is walking and performing stretching exercises.  Ms. Koeneman Morphine equivalent is 90.00 MME.   Last UDS was Performed on 01/27/2023, it was consistent.    Pain Inventory Average Pain 4 Pain Right Now 2 My pain is constant, dull, stabbing, tingling, and aching  In the last 24 hours, has pain interfered with the following? General activity 8 Relation with others 7 Enjoyment of life 7 What TIME of day is your pain at its worst? evening Sleep (in general) Fair  Pain is worse with: walking, bending, standing, and some activites Pain improves with: rest, heat/ice, therapy/exercise, and medication Relief from Meds: 7  Family History  Problem Relation Age of Onset   Cancer Mother        multiple myeloma   Diabetes Father    Kidney disease Father    Heart disease Father    Social History   Socioeconomic History   Marital status: Married    Spouse name: Not on file   Number of children: 2   Years of education: some college   Highest education level: Not on file  Occupational History   Occupation: homemaker  Tobacco Use   Smoking status: Never   Smokeless tobacco: Never  Vaping Use   Vaping status: Never Used  Substance and Sexual Activity   Alcohol use: No    Alcohol/week: 0.0 standard drinks of alcohol   Drug use: No   Sexual activity: Not on file  Other Topics Concern   Not on file  Social History Narrative   Lives at home with husband.   Right-handed.   No daily caffeine use.   Social  Determinants of Health   Financial Resource Strain: Not on file  Food Insecurity: No Food Insecurity (06/21/2022)   Hunger Vital Sign    Worried About Running Out of Food in the Last Year: Never true    Ran Out of Food in the Last Year: Never true  Transportation Needs: No Transportation Needs (06/21/2022)   PRAPARE - Administrator, Civil Service (Medical): No    Lack of Transportation (Non-Medical): No  Physical Activity: Not on file  Stress: Not on file  Social Connections: Not on file   Past Surgical History:  Procedure Laterality Date   CERVICAL SPINE SURGERY  2005   C6-7 fusion   CESAREAN SECTION     x2   CYSTOSCOPY WITH RETROGRADE PYELOGRAM, URETEROSCOPY AND STENT PLACEMENT Left 06/21/2022   Procedure: CYSTOSCOPY WITH RETROGRADE PYELOGRAM, URETEROSCOPY AND STENT PLACEMENT;  Surgeon: Vanna Scotland, MD;  Location: [facility];  Service: Urology;  Laterality: Left;   CYSTOSCOPY/URETEROSCOPY/HOLMIUM LASER/STENT PLACEMENT Left 07/06/2022   Procedure: CYSTOSCOPY/URETEROSCOPY/HOLMIUM LASER/STENT EXCHANGE;  Surgeon: Riki Altes, MD;  Location: [facility];  Service: Urology;  Laterality: Left;   dental implant  11/2013   ENDOSCOPIC PLANTAR FASCIOTOMY Right 12/2013   HAND SURGERY Right    KNEE ARTHROSCOPY  1985   LITHOTRIPSY     multiple   PARTIAL GASTRECTOMY  01/01/2016   PLANTAR FASCIECTOMY Right    SHOULDER SURGERY Left 10/2004   SMALL INTESTINE SURGERY     cervical laminectomy   utereroscopy  1995   Past Surgical History:  Procedure Laterality Date   CERVICAL SPINE SURGERY  2005   C6-7 fusion   CESAREAN SECTION     x2   CYSTOSCOPY WITH RETROGRADE PYELOGRAM, URETEROSCOPY AND STENT PLACEMENT Left 06/21/2022   Procedure: CYSTOSCOPY WITH RETROGRADE PYELOGRAM, URETEROSCOPY AND STENT PLACEMENT;  Surgeon: Vanna Scotland, MD;  Location: [facility];  Service: Urology;  Laterality: Left;   CYSTOSCOPY/URETEROSCOPY/HOLMIUM LASER/STENT PLACEMENT Left 07/06/2022   Procedure:  CYSTOSCOPY/URETEROSCOPY/HOLMIUM LASER/STENT EXCHANGE;  Surgeon: Riki Altes, MD;  Location: [facility];  Service: Urology;  Laterality: Left;   dental implant  11/2013   ENDOSCOPIC PLANTAR FASCIOTOMY Right 12/2013   HAND SURGERY Right    KNEE ARTHROSCOPY  1985   LITHOTRIPSY     multiple   PARTIAL GASTRECTOMY  01/01/2016   PLANTAR FASCIECTOMY Right    SHOULDER SURGERY Left 10/2004   SMALL INTESTINE SURGERY     cervical laminectomy   utereroscopy  1995   Past Medical History:  Diagnosis Date   Alopecia    Anemia    Anxiety    Cervical radiculopathy at C6    Chronic kidney disease    stones   Chronic pain    Complex regional pain syndrome I    right foot   Depression    Diabetes mellitus type 2, uncomplicated (HCC)    GERD (gastroesophageal reflux disease)    Heart murmur    History of kidney stones    Hypercholesterolemia    Hypertension, essential, benign    Kidney stone    Morbid obesity (HCC)    Neuromuscular disorder (HCC)    Plantar fasciitis    Stage 4 chronic kidney disease (HCC)    Steatosis of liver    Tremor    Vitamin D deficiency    BP 104/73   Pulse 73   Ht 5\' 7"  (1.702 m)   Wt 266 lb (120.7 kg)   LMP 12/02/2019   SpO2 98%   BMI 41.66 kg/m   Opioid Risk Score:   Fall Risk Score:  `1  Depression screen [facility] 2/9     02/23/2023    8:47 AM 01/27/2023    9:23 AM 12/27/2022   10:53 AM 12/01/2022   10:21 AM 08/31/2022    3:04 PM 07/28/2022    3:15 PM 05/18/2022    3:38 PM  Depression screen PHQ 2/9  Decreased Interest 1 0 0 0 0 0 0  Down, Depressed, Hopeless 1 0 0 0 0 0 0  PHQ - 2 Score 2 0 0 0 0 0 0      Review of Systems  Musculoskeletal:  Positive for back pain and neck pain.       Left arm pain Bilateral knee pain Bilateral foot pain  All other systems reviewed and are negative.     Objective:   Physical Exam Vitals and nursing note reviewed.  Constitutional:      Appearance: Normal appearance.  Cardiovascular:     Rate and Rhythm:  Normal rate and regular rhythm.     Pulses: Normal pulses.     Heart sounds: Normal heart sounds.  Pulmonary:     Effort: Pulmonary effort is normal.     Breath sounds: Normal breath sounds.  Musculoskeletal:     Comments: Normal Muscle Bulk and Muscle Testing Reveals:  Upper Extremities: Right: Full ROM and Muscle Strength 5/5 Left Upper Extremity: Decreased ROM 90 Degrees and Muscle Strength 5/5  Thoracic Paraspinal Tenderness: T-7-T- 9 Mainly Right Side Lower Extremities: Full ROM and Muscle Strength 5/5 Arises from Table with ease Narrow Based Gait     Skin:    General: Skin is warm and dry.  Neurological:     Mental Status: She is alert and oriented to person, place, and time.  Psychiatric:        Mood and Affect: Mood normal.        Behavior: Behavior normal.         Assessment & Plan:  1.Complex regional pain syndrome right lower extremity postoperative. She's s/p post plantar fascial release. She continue with sensitivity to touch. She has diffuse numbness and tingling.Continue Gabapentin . Marland Kitchen 04/18/2023. Nucynta discontinued due to Urine Clearance. .We will continue the opioid monitoring program, this consists of regular clinic visits, examinations, urine drug screen, pill counts as well as use of [place] Controlled Substance Reporting system. A 12 month History has been reviewed on the [place] Controlled Substance Reporting System on 04/18/2023. 2.Myofascial pain syndrome: Continue current medication regime with Tizanidine and  ice, heat and exercise regime. 04/18/2023 3. Depression: Continue: Zoloft. PCP Following. 04/18/2023 4. Cervical Post Laminectomy: Continue to Monitor. 04/18/2023 5. Cervicalgia/Cervical  Radiculopathy/ Left shoulder, Left Arm and Left  Hand Pain with tingling,  . Continue to monitor. 04/18/2023 6. Poly Neuropathy: Continue current medication regimen with Nortriptyline  and :Continue to Monitor. 1125/2024 7. Left Shoulder Tendonitis:  Chronic Left Shoulder Pain:  Ortho Following. Continue current medication regime and Continue  to Monitor. 04/18/2023 8. Right hand pain/ Post Surgical  Procedure: Trigger Finger Release: Dr. Ardyth Harps Following.  No complaints Today. 04/18/2023. 9. Tremor: No complaints today. Gabapentin being weaned slowly. Continue to monitor.  04/18/2023 10. Chronic Right   Knee Pain: Orthopedics Following: Continue with HEP as Tolerated. Continue to Monitor. 04/18/2023 11. Chronic Right  Foot Pain. Podiatry Following.Continue with HEP as tolerated. Continue current medication regimen. Continue to monitor. 04/18/2023  12. Left Greater Trochanter Bursitis: No complaints today. Continue to alternate Ice and Heat Therapy. Continue to Monitor. 04/18/2023  13. Right Lateral Epicondylitis:  No complaints today. Ortho Following. Continue to Monitor.  04/18/2023  14/ Fall subsequent encounter/ No falls: . Refilled : Oxycodone 15 mg one tablet 4 times a day as needed for pain. #120. We will re-evaluate next month, slow weaning, she verbalizes understanding. With a goal to resume Oxycodone 10 mg 4 times a day as needed for pain. We will continue the opioid monitoring program, this consists of regular clinic visits, examinations, urine drug screen, pill counts as well as use of [place] Controlled Substance Reporting system. A 12 month History has been reviewed on the [place] Controlled Substance Reporting System on 04/18/2023.  15. Left Elbow Pain: Ortho Following. Continue Physical Therapy.    F/U in 1 month

## 2023-04-22 ENCOUNTER — Encounter: Payer: Self-pay | Admitting: Registered Nurse

## 2023-05-16 ENCOUNTER — Encounter: Payer: 59 | Attending: Registered Nurse | Admitting: Registered Nurse

## 2023-05-16 ENCOUNTER — Encounter: Payer: Self-pay | Admitting: Registered Nurse

## 2023-05-16 VITALS — BP 100/67 | HR 86 | Ht 67.0 in | Wt 255.0 lb

## 2023-05-16 DIAGNOSIS — M5412 Radiculopathy, cervical region: Secondary | ICD-10-CM | POA: Insufficient documentation

## 2023-05-16 DIAGNOSIS — G8929 Other chronic pain: Secondary | ICD-10-CM | POA: Insufficient documentation

## 2023-05-16 DIAGNOSIS — G90521 Complex regional pain syndrome I of right lower limb: Secondary | ICD-10-CM | POA: Insufficient documentation

## 2023-05-16 DIAGNOSIS — M25512 Pain in left shoulder: Secondary | ICD-10-CM | POA: Diagnosis present

## 2023-05-16 DIAGNOSIS — M542 Cervicalgia: Secondary | ICD-10-CM | POA: Diagnosis present

## 2023-05-16 DIAGNOSIS — M792 Neuralgia and neuritis, unspecified: Secondary | ICD-10-CM | POA: Diagnosis present

## 2023-05-16 MED ORDER — OXYCODONE HCL 10 MG PO TABS: 10.0000 mg | ORAL_TABLET | Freq: Every day | ORAL | 0 refills | Status: DC | PRN

## 2023-05-16 NOTE — Progress Notes (Signed)
Subjective:    Patient ID: Sabrina Williams, female    DOB: 1970-11-29, 52 y.o.   MRN: [id number]  HPI: Sabrina Williams is a 52 y.o. female who returns for follow up appointment for chronic pain and medication refill. She states her pain is located in her left shoulder radiating into her left pinky, left ring finger and bilateral feet with tingling and burning. She rates her pain 4. Her current exercise regime is walking and performing stretching exercises.  [name] Morphine equivalent is 90.00  MME, begin slow weaning MME will decreased to 75.00 MME.   Last UDS was Performed on 01/27/2023, it was consistent.     Pain Inventory Average Pain 5 Pain Right Now 4 My pain is intermittent, constant, sharp, burning, dull, stabbing, and tingling  In the last 24 hours, has pain interfered with the following? General activity 8 Relation with others 5 Enjoyment of life 7 What TIME of day is your pain at its worst? evening Sleep (in general) Fair  Pain is worse with: walking, sitting, standing, and some activites Pain improves with: rest, heat/ice, therapy/exercise, and medication Relief from Meds: 7  Family History  Problem Relation Age of Onset   Cancer Mother        multiple myeloma   Diabetes Father    Kidney disease Father    Heart disease Father    Social History   Socioeconomic History   Marital status: Married    Spouse name: Not on file   Number of children: 2   Years of education: some college   Highest education level: Not on file  Occupational History   Occupation: homemaker  Tobacco Use   Smoking status: Never   Smokeless tobacco: Never  Vaping Use   Vaping status: Never Used  Substance and Sexual Activity   Alcohol use: No    Alcohol/week: 0.0 standard drinks of alcohol   Drug use: No   Sexual activity: Not on file  Other Topics Concern   Not on file  Social History Narrative   Lives at home with husband.   Right-handed.   No daily caffeine use.    Social Drivers of Corporate investment banker Strain: Not on file  Food Insecurity: No Food Insecurity (06/21/2022)   Hunger Vital Sign    Worried About Running Out of Food in the Last Year: Never true    Ran Out of Food in the Last Year: Never true  Transportation Needs: No Transportation Needs (06/21/2022)   PRAPARE - Administrator, Civil Service (Medical): No    Lack of Transportation (Non-Medical): No  Physical Activity: Not on file  Stress: Not on file  Social Connections: Not on file   Past Surgical History:  Procedure Laterality Date   CERVICAL SPINE SURGERY  2005   C6-7 fusion   CESAREAN SECTION     x2   CYSTOSCOPY WITH RETROGRADE PYELOGRAM, URETEROSCOPY AND STENT PLACEMENT Left 06/21/2022   Procedure: CYSTOSCOPY WITH RETROGRADE PYELOGRAM, URETEROSCOPY AND STENT PLACEMENT;  Surgeon: Vanna Scotland, MD;  Location: [facility];  Service: Urology;  Laterality: Left;   CYSTOSCOPY/URETEROSCOPY/HOLMIUM LASER/STENT PLACEMENT Left 07/06/2022   Procedure: CYSTOSCOPY/URETEROSCOPY/HOLMIUM LASER/STENT EXCHANGE;  Surgeon: Riki Altes, MD;  Location: [facility];  Service: Urology;  Laterality: Left;   dental implant  11/2013   ENDOSCOPIC PLANTAR FASCIOTOMY Right 12/2013   HAND SURGERY Right    KNEE ARTHROSCOPY  1985   LITHOTRIPSY     multiple   PARTIAL  GASTRECTOMY  01/01/2016   PLANTAR FASCIECTOMY Right    SHOULDER SURGERY Left 10/2004   SMALL INTESTINE SURGERY     cervical laminectomy   utereroscopy  1995   Past Surgical History:  Procedure Laterality Date   CERVICAL SPINE SURGERY  2005   C6-7 fusion   CESAREAN SECTION     x2   CYSTOSCOPY WITH RETROGRADE PYELOGRAM, URETEROSCOPY AND STENT PLACEMENT Left 06/21/2022   Procedure: CYSTOSCOPY WITH RETROGRADE PYELOGRAM, URETEROSCOPY AND STENT PLACEMENT;  Surgeon: Vanna Scotland, MD;  Location: [facility];  Service: Urology;  Laterality: Left;   CYSTOSCOPY/URETEROSCOPY/HOLMIUM LASER/STENT PLACEMENT Left 07/06/2022    Procedure: CYSTOSCOPY/URETEROSCOPY/HOLMIUM LASER/STENT EXCHANGE;  Surgeon: Riki Altes, MD;  Location: [facility];  Service: Urology;  Laterality: Left;   dental implant  11/2013   ENDOSCOPIC PLANTAR FASCIOTOMY Right 12/2013   HAND SURGERY Right    KNEE ARTHROSCOPY  1985   LITHOTRIPSY     multiple   PARTIAL GASTRECTOMY  01/01/2016   PLANTAR FASCIECTOMY Right    SHOULDER SURGERY Left 10/2004   SMALL INTESTINE SURGERY     cervical laminectomy   utereroscopy  1995   Past Medical History:  Diagnosis Date   Alopecia    Anemia    Anxiety    Cervical radiculopathy at C6    Chronic kidney disease    stones   Chronic pain    Complex regional pain syndrome I    right foot   Depression    Diabetes mellitus type 2, uncomplicated (HCC)    GERD (gastroesophageal reflux disease)    Heart murmur    History of kidney stones    Hypercholesterolemia    Hypertension, essential, benign    Kidney stone    Morbid obesity (HCC)    Neuromuscular disorder (HCC)    Plantar fasciitis    Stage 4 chronic kidney disease (HCC)    Steatosis of liver    Tremor    Vitamin D deficiency    LMP 12/02/2019   Opioid Risk Score:   Fall Risk Score:  `1  Depression screen [facility] 2/9     02/23/2023    8:47 AM 01/27/2023    9:23 AM 12/27/2022   10:53 AM 12/01/2022   10:21 AM 08/31/2022    3:04 PM 07/28/2022    3:15 PM 05/18/2022    3:38 PM  Depression screen PHQ 2/9  Decreased Interest 1 0 0 0 0 0 0  Down, Depressed, Hopeless 1 0 0 0 0 0 0  PHQ - 2 Score 2 0 0 0 0 0 0     Review of Systems  Musculoskeletal:  Positive for gait problem.       Left shoulder and arm pain  B/L knee pain   All other systems reviewed and are negative.     Objective:   Physical Exam Vitals and nursing note reviewed.  Constitutional:      Appearance: Normal appearance.  Cardiovascular:     Rate and Rhythm: Normal rate and regular rhythm.     Pulses: Normal pulses.     Heart sounds: Normal heart sounds.  Pulmonary:      Effort: Pulmonary effort is normal.     Breath sounds: Normal breath sounds.  Musculoskeletal:     Comments: Normal Muscle Bulk and Muscle Testing Reveals:  Upper Extremities: Full ROM and Muscle Strength 5/5  Lower Extremities : Full ROM and Muscle Strength 5/5  Arises from Table with ease Narrow Based Gait     Skin:  General: Skin is warm and dry.  Neurological:     Mental Status: She is alert and oriented to person, place, and time.  Psychiatric:        Mood and Affect: Mood normal.        Behavior: Behavior normal.         Assessment & Plan:  1.Complex regional pain syndrome right lower extremity postoperative. She's s/p post plantar fascial release. She continue with sensitivity to touch. She has diffuse numbness and tingling.Continue Gabapentin . Marland Kitchen 05/16/2023. Nucynta discontinued due to Urine Clearance. .We will continue the opioid monitoring program, this consists of regular clinic visits, examinations, urine drug screen, pill counts as well as use of [place] Controlled Substance Reporting system. A 12 month History has been reviewed on the [place] Controlled Substance Reporting System on 05/16/2023. 2.Myofascial pain syndrome: Continue current medication regime with Tizanidine and  ice, heat and exercise regime. 05/16/2023 3. Depression: Continue: Zoloft. PCP Following. 05/16/2023 4. Cervical Post Laminectomy: Continue to Monitor. 05/16/2023 5. Cervicalgia/Cervical  Radiculopathy/ Left shoulder, Left Arm and Left  Hand Pain with tingling,  . Continue to monitor. 05/16/2023 6. Poly Neuropathy: Continue current medication regimen with Nortriptyline  and :Continue to Monitor. 05/16/2023 7. Left Shoulder Tendonitis: Chronic Left Shoulder Pain:  Ortho Following. Continue current medication regime and Continue  to Monitor. 05/16/2023 8. Right hand pain/ Post Surgical  Procedure: Trigger Finger Release: Dr. Ardyth Harps Following.  No complaints Today. 05/16/2023. 9.  Tremor: No complaints today. Gabapentin being weaned slowly. Continue to monitor.  05/16/2023 10. Chronic Right   Knee Pain: Orthopedics Following: Continue with HEP as Tolerated. Continue to Monitor. 05/16/2023 11. Chronic Right  Foot Pain. Podiatry Following.Continue with HEP as tolerated. Continue current medication regimen. Continue to monitor. 05/16/2023  12. Left Greater Trochanter Bursitis: No complaints today. Continue to alternate Ice and Heat Therapy. Continue to Monitor. 05/16/2023  13. Right Lateral Epicondylitis:  No complaints today. Ortho Following. Continue to Monitor.  05/16/2023  14/ Fall subsequent encounter/ No falls: . RX : Oxycodone 10 mg one tablet 5 times a day as needed for pain. #150. We will re-evaluate next month, slow weaning, she verbalizes understanding. With a goal to resume Oxycodone 10 mg 4 times a day as needed for pain. We will continue the opioid monitoring program, this consists of regular clinic visits, examinations, urine drug screen, pill counts as well as use of [place] Controlled Substance Reporting system. A 12 month History has been reviewed on the [place] Controlled Substance Reporting System on 05/16/2023.  15. Left Elbow Pain: Ortho Following. Continue Physical Therapy.  16. Neuropathic Pain: Continue Gabapentin as prescribed. Continue To Monitor.      F/U in 1 month

## 2023-05-23 ENCOUNTER — Encounter: Payer: Self-pay | Admitting: Urology

## 2023-05-23 ENCOUNTER — Ambulatory Visit (INDEPENDENT_AMBULATORY_CARE_PROVIDER_SITE_OTHER): Payer: 59 | Admitting: Urology

## 2023-05-23 VITALS — BP 123/83 | HR 77 | Ht 67.0 in | Wt 258.0 lb

## 2023-05-23 DIAGNOSIS — R82998 Other abnormal findings in urine: Secondary | ICD-10-CM

## 2023-05-23 DIAGNOSIS — N3000 Acute cystitis without hematuria: Secondary | ICD-10-CM

## 2023-05-23 DIAGNOSIS — R300 Dysuria: Secondary | ICD-10-CM

## 2023-05-23 DIAGNOSIS — N200 Calculus of kidney: Secondary | ICD-10-CM

## 2023-05-23 DIAGNOSIS — R350 Frequency of micturition: Secondary | ICD-10-CM

## 2023-05-23 DIAGNOSIS — R399 Unspecified symptoms and signs involving the genitourinary system: Secondary | ICD-10-CM

## 2023-05-23 DIAGNOSIS — R3915 Urgency of urination: Secondary | ICD-10-CM

## 2023-05-23 MED ORDER — SULFAMETHOXAZOLE-TRIMETHOPRIM 800-160 MG PO TABS: 1.0000 | ORAL_TABLET | Freq: Two times a day (BID) | ORAL | 0 refills | Status: AC

## 2023-05-23 NOTE — Progress Notes (Signed)
I, Sabrina Williams, acting as a scribe for Sabrina Altes, Sabrina Williams., have documented all relevant documentation on the behalf of Sabrina Altes, Sabrina Williams, as directed by Sabrina Altes, Sabrina Williams while in the presence of Sabrina Altes, Sabrina Williams.  05/23/2023 12:33 PM   Adam Phenix Carreon 1971/01/30 [id number]  Referring provider: Luciana Axe, NP [address] [place],  [place] [address]  Chief Complaint  Patient presents with   Urinary Tract Infection   Urologic history: 1.  Nephrolithiasis CT July 2023 with bilateral, non-obstructing renal calculi Left ureteroscopic stone removal 06/2022  HPI: Sabrina Williams is a 52 y.o. female presents for evaluation of possible UTI.  2 week history of urinary frequency, urgency, and dysuria. Took a home UTI test, which was positive.  No fever, chills, or gross hematuria.  No flank pain/renal colic.    PMH: Past Medical History:  Diagnosis Date   Alopecia    Anemia    Anxiety    Cervical radiculopathy at C6    Chronic kidney disease    stones   Chronic pain    Complex regional pain syndrome I    right foot   Depression    Diabetes mellitus type 2, uncomplicated (HCC)    GERD (gastroesophageal reflux disease)    Heart murmur    History of kidney stones    Hypercholesterolemia    Hypertension, essential, benign    Kidney stone    Morbid obesity (HCC)    Neuromuscular disorder (HCC)    Plantar fasciitis    Stage 4 chronic kidney disease (HCC)    Steatosis of liver    Tremor    Vitamin D deficiency     Surgical History: Past Surgical History:  Procedure Laterality Date   CERVICAL SPINE SURGERY  2005   C6-7 fusion   CESAREAN SECTION     x2   CYSTOSCOPY WITH RETROGRADE PYELOGRAM, URETEROSCOPY AND STENT PLACEMENT Left 06/21/2022   Procedure: CYSTOSCOPY WITH RETROGRADE PYELOGRAM, URETEROSCOPY AND STENT PLACEMENT;  Surgeon: Vanna Scotland, Sabrina Williams;  Location: [facility];  Service: Urology;  Laterality: Left;    CYSTOSCOPY/URETEROSCOPY/HOLMIUM LASER/STENT PLACEMENT Left 07/06/2022   Procedure: CYSTOSCOPY/URETEROSCOPY/HOLMIUM LASER/STENT EXCHANGE;  Surgeon: Sabrina Altes, Sabrina Williams;  Location: [facility];  Service: Urology;  Laterality: Left;   dental implant  11/2013   ENDOSCOPIC PLANTAR FASCIOTOMY Right 12/2013   HAND SURGERY Right    KNEE ARTHROSCOPY  1985   LITHOTRIPSY     multiple   PARTIAL GASTRECTOMY  01/01/2016   PLANTAR FASCIECTOMY Right    SHOULDER SURGERY Left 10/2004   SMALL INTESTINE SURGERY     cervical laminectomy   utereroscopy  1995    Home Medications:  Allergies as of 05/23/2023       Reactions   Penicillins Hives, Rash   Nsaids    Other Reaction(s): Not available        Medication List        Accurate as of May 23, 2023 12:33 PM. If you have any questions, ask your nurse or doctor.          acetaminophen 500 MG tablet Commonly known as: TYLENOL Take by mouth.   amLODipine 2.5 MG tablet Commonly known as: NORVASC Take by mouth.   Bariatric Multivitamins/Iron Caps Take 3 capsules by mouth daily before supper.   benazepril 20 MG tablet Commonly known as: LOTENSIN Take by mouth.   calcitRIOL 0.5 MCG capsule Commonly known as: ROCALTROL Take 0.5 mcg by mouth in  the morning and at bedtime.   calcium carbonate 1250 MG capsule Take 1,250 mg by mouth daily.   DULoxetine 60 MG capsule Commonly known as: CYMBALTA Take 60 mg by mouth daily.   ferrous sulfate 325 (65 FE) MG tablet Take 325 mg by mouth daily with breakfast.   Fingerstix Lancets Misc Use 1 each 2 (two) times daily As directed [Test blood sugars in rotating pattern:  fasting, before meals, 2 hours after a meal, at bedtime]   gabapentin 100 MG capsule Commonly known as: NEURONTIN TAKE 1 CAPSULE BY MOUTH EVERY DAY   glucose blood test strip USE 1 STRIP TWICE A DAY AS DIRECTED   Lagevrio 200 MG Caps capsule Generic drug: molnupiravir EUA TAKE 4 CAPSULES (800 MG TOTAL) BY MOUTH TWICE  A DAY FOR 5 DAYS   lovastatin 20 MG tablet Commonly known as: MEVACOR Take 20 mg by mouth every evening.   magnesium oxide 400 (240 Mg) MG tablet Commonly known as: MAG-OX Take 1 tablet by mouth daily.   nortriptyline 75 MG capsule Commonly known as: PAMELOR TAKE 1 CAPSULE BY MOUTH EVERYDAY AT BEDTIME   omeprazole 10 MG capsule Commonly known as: PRILOSEC Take 1 tablet by mouth daily.   ondansetron 4 MG tablet Commonly known as: ZOFRAN TAKE 1 TABLET (4 MG TOTAL) BY MOUTH 2 (TWO) TIMES DAILY AS NEEDED FOR NAUSEA OR VOMITING.   oxybutynin 10 MG 24 hr tablet Commonly known as: DITROPAN-XL TAKE 1 TABLET BY MOUTH EVERYDAY AT BEDTIME   Oxycodone HCl 10 MG Tabs Take 1 tablet (10 mg total) by mouth 5 (five) times daily as needed. Slowly weaning from Oxycodone 15 mg tablets   patiromer 8.4 g packet Commonly known as: VELTASSA Take by mouth.   sulfamethoxazole-trimethoprim 800-160 MG tablet Commonly known as: BACTRIM DS Take 1 tablet by mouth 2 (two) times daily for 7 days. Started by: Sabrina Williams   tamsulosin 0.4 MG Caps capsule Commonly known as: FLOMAX Take 1 capsule (0.4 mg total) by mouth daily.   tiZANidine 4 MG tablet Commonly known as: ZANAFLEX TAKE 1 TABLET (4 MG TOTAL) BY MOUTH 5 (FIVE) TIMES DAILY AS NEEDED FOR MUSCLE SPASMS.   torsemide 5 MG tablet Commonly known as: DEMADEX Take 5 mg by mouth daily.   VITAMIN C (CALCIUM ASCORBATE) PO   Vitamin D (Ergocalciferol) 1.25 MG (50000 UNIT) Caps capsule Commonly known as: DRISDOL TAKE 1 CAPSULE (50,000 UNITS TOTAL) BY MOUTH TWICE A WEEK FOR 30 DAYS   VITAMIN D3 PO Take 2,000 Units by mouth daily.        Allergies:  Allergies  Allergen Reactions   Penicillins Hives and Rash   Nsaids     Other Reaction(s): Not available    Family History: Family History  Problem Relation Age of Onset   Cancer Mother        multiple myeloma   Diabetes Father    Kidney disease Father    Heart disease Father      Social History:  reports that she has never smoked. She has never used smokeless tobacco. She reports that she does not drink alcohol and does not use drugs.   Physical Exam: BP 123/83 (BP Location: Right Arm, Patient Position: Sitting, Cuff Size: Large)   Pulse 77   Ht 5\' 7"  (1.702 m)   Wt 258 lb (117 kg)   LMP 12/02/2019   BMI 40.41 kg/m   Constitutional:  Alert and oriented, No acute distress. HEENT:  AT Respiratory: Normal respiratory  effort, no increased work of breathing. Psychiatric: Normal mood and affect.   Urinalysis Dipstick trace blood/3+ leukocytes, microscopy >30 WBC/3-10 RBC    Assessment & Plan:    1. Acute cystitis Urine culture ordered Rx Septra DX sent   2. Nephrolithiasis Scheduled for follow-up June 2025 with [facility].  I have reviewed the above documentation for accuracy and completeness, and I agree with the above.   Sabrina Altes, Sabrina Williams  [facility] [address], [address] [place], [place] [address] [phone]

## 2023-05-24 LAB — URINALYSIS, COMPLETE
Bilirubin, UA: NEGATIVE
Glucose, UA: NEGATIVE
Ketones, UA: NEGATIVE
Nitrite, UA: NEGATIVE
Specific Gravity, UA: 1.02 (ref 1.005–1.030)
Urobilinogen, Ur: 0.2 mg/dL (ref 0.2–1.0)
pH, UA: 6 (ref 5.0–7.5)

## 2023-05-24 LAB — MICROSCOPIC EXAMINATION: WBC, UA: >30 /[HPF] — AB (ref 0–5)

## 2023-05-26 LAB — CULTURE, URINE COMPREHENSIVE

## 2023-05-30 ENCOUNTER — Telehealth: Payer: Self-pay | Admitting: Registered Nurse

## 2023-05-30 ENCOUNTER — Other Ambulatory Visit: Payer: Self-pay | Admitting: Registered Nurse

## 2023-05-30 MED ORDER — NORTRIPTYLINE HCL 75 MG PO CAPS: 75.0000 mg | ORAL_CAPSULE | Freq: Every day | ORAL | 5 refills | Status: DC

## 2023-05-30 NOTE — Telephone Encounter (Signed)
Nortriptyline prescription sent to pharmacy

## 2023-06-15 ENCOUNTER — Encounter: Payer: 59 | Admitting: Registered Nurse

## 2023-06-15 NOTE — Progress Notes (Unsigned)
Subjective:    Patient ID: Sabrina Williams, female    DOB: [birth year], 53 y.o.   MRN: [id number]  HPI: Sabrina Williams is a 53 y.o. female who returns for follow up appointment for chronic pain and medication refill. She states her pain is located in her Left Shoulder, reports Ortho following and prescribed Methocarbamol. She was instructed to decrease her Tizanidine to HS Only, she verbalizes understanding. Also instructed to check her blood pressure prior to taking her Tizanidine and send a My-Chart message with update, she verbalizes understanding. Also reports , Left arm, Right Knee and Right Foot pain . She rates her pain 3. Her current exercise regime is walking, attending physical therapy weekly and performing stretching exercises.  [name] Morphine equivalent is 75.00 MME.   UDS ordered today.      Pain Inventory Average Pain 5 Pain Right Now 3 My pain is intermittent, constant, sharp, burning, dull, stabbing, tingling, and aching  In the last 24 hours, has pain interfered with the following? General activity 0 Relation with others 0 Enjoyment of life 3 What TIME of day is your pain at its worst? evening Sleep (in general) Fair  Pain is worse with: walking, standing, and some activites Pain improves with: rest, heat/ice, and medication Relief from Meds: 7  Family History  Problem Relation Age of Onset   Cancer Mother        multiple myeloma   Diabetes Father    Kidney disease Father    Heart disease Father    Social History   Socioeconomic History   Marital status: Married    Spouse name: Not on file   Number of children: 2   Years of education: some college   Highest education level: Not on file  Occupational History   Occupation: homemaker  Tobacco Use   Smoking status: Never   Smokeless tobacco: Never  Vaping Use   Vaping status: Never Used  Substance and Sexual Activity   Alcohol use: No    Alcohol/week: 0.0 standard drinks of alcohol   Drug  use: No   Sexual activity: Not on file  Other Topics Concern   Not on file  Social History Narrative   Lives at home with husband.   Right-handed.   No daily caffeine use.   Social Drivers of Corporate investment banker Strain: Not on file  Food Insecurity: No Food Insecurity (06/21/2022)   Hunger Vital Sign    Worried About Running Out of Food in the Last Year: Never true    Ran Out of Food in the Last Year: Never true  Transportation Needs: No Transportation Needs (06/21/2022)   PRAPARE - Administrator, Civil Service (Medical): No    Lack of Transportation (Non-Medical): No  Physical Activity: Not on file  Stress: Not on file  Social Connections: Not on file   Past Surgical History:  Procedure Laterality Date   CERVICAL SPINE SURGERY  2005   C6-7 fusion   CESAREAN SECTION     x2   CYSTOSCOPY WITH RETROGRADE PYELOGRAM, URETEROSCOPY AND STENT PLACEMENT Left 06/21/2022   Procedure: CYSTOSCOPY WITH RETROGRADE PYELOGRAM, URETEROSCOPY AND STENT PLACEMENT;  Surgeon: Vanna Scotland, MD;  Location: [facility];  Service: Urology;  Laterality: Left;   CYSTOSCOPY/URETEROSCOPY/HOLMIUM LASER/STENT PLACEMENT Left 07/06/2022   Procedure: CYSTOSCOPY/URETEROSCOPY/HOLMIUM LASER/STENT EXCHANGE;  Surgeon: Riki Altes, MD;  Location: [facility];  Service: Urology;  Laterality: Left;   dental implant  11/2013   ENDOSCOPIC PLANTAR  FASCIOTOMY Right 12/2013   HAND SURGERY Right    KNEE ARTHROSCOPY  1985   LITHOTRIPSY     multiple   PARTIAL GASTRECTOMY  01/01/2016   PLANTAR FASCIECTOMY Right    SHOULDER SURGERY Left 10/2004   SMALL INTESTINE SURGERY     cervical laminectomy   utereroscopy  1995   Past Surgical History:  Procedure Laterality Date   CERVICAL SPINE SURGERY  2005   C6-7 fusion   CESAREAN SECTION     x2   CYSTOSCOPY WITH RETROGRADE PYELOGRAM, URETEROSCOPY AND STENT PLACEMENT Left 06/21/2022   Procedure: CYSTOSCOPY WITH RETROGRADE PYELOGRAM, URETEROSCOPY AND STENT  PLACEMENT;  Surgeon: Vanna Scotland, MD;  Location: [facility];  Service: Urology;  Laterality: Left;   CYSTOSCOPY/URETEROSCOPY/HOLMIUM LASER/STENT PLACEMENT Left 07/06/2022   Procedure: CYSTOSCOPY/URETEROSCOPY/HOLMIUM LASER/STENT EXCHANGE;  Surgeon: Riki Altes, MD;  Location: [facility];  Service: Urology;  Laterality: Left;   dental implant  11/2013   ENDOSCOPIC PLANTAR FASCIOTOMY Right 12/2013   HAND SURGERY Right    KNEE ARTHROSCOPY  1985   LITHOTRIPSY     multiple   PARTIAL GASTRECTOMY  01/01/2016   PLANTAR FASCIECTOMY Right    SHOULDER SURGERY Left 10/2004   SMALL INTESTINE SURGERY     cervical laminectomy   utereroscopy  1995   Past Medical History:  Diagnosis Date   Alopecia    Anemia    Anxiety    Cervical radiculopathy at C6    Chronic kidney disease    stones   Chronic pain    Complex regional pain syndrome I    right foot   Depression    Diabetes mellitus type 2, uncomplicated (HCC)    GERD (gastroesophageal reflux disease)    Heart murmur    History of kidney stones    Hypercholesterolemia    Hypertension, essential, benign    Kidney stone    Morbid obesity (HCC)    Neuromuscular disorder (HCC)    Plantar fasciitis    Stage 4 chronic kidney disease (HCC)    Steatosis of liver    Tremor    Vitamin D deficiency    LMP 12/02/2019   Opioid Risk Score:   Fall Risk Score:  `1  Depression screen [facility] 2/9     02/23/2023    8:47 AM 01/27/2023    9:23 AM 12/27/2022   10:53 AM 12/01/2022   10:21 AM 08/31/2022    3:04 PM 07/28/2022    3:15 PM 05/18/2022    3:38 PM  Depression screen PHQ 2/9  Decreased Interest 1 0 0 0 0 0 0  Down, Depressed, Hopeless 1 0 0 0 0 0 0  PHQ - 2 Score 2 0 0 0 0 0 0    Review of Systems  Musculoskeletal:        Left shoulder,left arm, right knee, right foot  All other systems reviewed and are negative.      Objective:   Physical Exam Vitals and nursing note reviewed.  Constitutional:      Appearance: Normal appearance.   Cardiovascular:     Rate and Rhythm: Normal rate and regular rhythm.     Pulses: Normal pulses.     Heart sounds: Normal heart sounds.  Pulmonary:     Effort: Pulmonary effort is normal.     Breath sounds: Normal breath sounds.  Musculoskeletal:     Comments: Normal Muscle Bulk and Muscle Testing Reveals:  Upper Extremities:Full  ROM and Muscle Strength 5/5  Lower Extremities: Full ROM  and Muscle Strength 5/5 Arises from Table with ease Narrow Based  Gait     Skin:    General: Skin is warm and dry.  Neurological:     Mental Status: She is alert and oriented to person, place, and time.  Psychiatric:        Mood and Affect: Mood normal.        Behavior: Behavior normal.         Assessment & Plan:  1.Complex regional pain syndrome right lower extremity postoperative. She's s/p post plantar fascial release. She continue with sensitivity to touch. She has diffuse numbness and tingling.Continue Gabapentin . Marland Kitchen 06/16/2023. Nucynta discontinued due to Urine Clearance. .We will continue the opioid monitoring program, this consists of regular clinic visits, examinations, urine drug screen, pill counts as well as use of [place] Controlled Substance Reporting system. A 12 month History has been reviewed on the [place] Controlled Substance Reporting System on 06/16/2023. 2.Myofascial pain syndrome: Continue current medication regime with Tizanidine, only at HS while prescribe Methocarbamol by Ortho, she verbalizes understanding. Continue with ice, heat and exercise regime. 06/16/2023 3. Depression: Continue: Zoloft. PCP Following. 06/16/2023 4. Cervical Post Laminectomy: Continue to Monitor. 06/16/2023 5. Cervicalgia/Cervical  Radiculopathy/ Left shoulder, Left Arm and Left  Hand Pain with tingling,  . Continue to monitor. 06/16/2023 6. Poly Neuropathy: Continue current medication regimen with Nortriptyline  and :Continue to Monitor. 06/16/2023 7. Left Shoulder Tendonitis: Chronic  Left Shoulder Pain:  Ortho Following. Continue current medication regime and Continue  to Monitor. 06/16/2023 8. Right hand pain/ Post Surgical  Procedure: Trigger Finger Release: Dr. Ardyth Harps Following.  No complaints Today. 06/16/2023. 9. Tremor: No complaints today. Gabapentin being weaned slowly. Continue to monitor.  06/16/2023 10. Chronic Right   Knee Pain: Orthopedics Following: Continue with HEP as Tolerated. Continue to Monitor. 06/16/2023 11. Chronic Right  Foot Pain. Podiatry Following.Continue with HEP as tolerated. Continue current medication regimen. Continue to monitor. 06/16/2023  12. Left Greater Trochanter Bursitis: No complaints today. Continue to alternate Ice and Heat Therapy. Continue to Monitor. 06/16/2023  13. Right Lateral Epicondylitis:  No complaints today. Ortho Following. Continue to Monitor.  06/16/2023  14/ Fall subsequent encounter/ No falls: . RX : Oxycodone 10 mg one tablet 5 times a day as needed for pain. #150. We will continue with slow weaning, she verbalizes understanding. With a goal to resume Oxycodone 10 mg 4 times a day as needed for pain. We will continue the opioid monitoring program, this consists of regular clinic visits, examinations, urine drug screen, pill counts as well as use of [place] Controlled Substance Reporting system. A 12 month History has been reviewed on the [place] Controlled Substance Reporting System on 06/16/2023.  15. Left Elbow Pain: Ortho Following. Continue Physical Therapy. 06/16/2023 16. Neuropathic Pain: Continue Gabapentin as prescribed. Continue To Monitor. 06/16/2023      F/U in 1 month

## 2023-06-16 ENCOUNTER — Encounter: Payer: 59 | Attending: Registered Nurse | Admitting: Registered Nurse

## 2023-06-16 ENCOUNTER — Encounter: Payer: Self-pay | Admitting: Registered Nurse

## 2023-06-16 VITALS — BP 131/86 | HR 85 | Ht 67.0 in | Wt 265.0 lb

## 2023-06-16 DIAGNOSIS — G894 Chronic pain syndrome: Secondary | ICD-10-CM | POA: Diagnosis present

## 2023-06-16 DIAGNOSIS — Z79891 Long term (current) use of opiate analgesic: Secondary | ICD-10-CM

## 2023-06-16 DIAGNOSIS — M1711 Unilateral primary osteoarthritis, right knee: Secondary | ICD-10-CM

## 2023-06-16 DIAGNOSIS — M792 Neuralgia and neuritis, unspecified: Secondary | ICD-10-CM | POA: Diagnosis present

## 2023-06-16 DIAGNOSIS — M25512 Pain in left shoulder: Secondary | ICD-10-CM | POA: Insufficient documentation

## 2023-06-16 DIAGNOSIS — G8929 Other chronic pain: Secondary | ICD-10-CM | POA: Diagnosis present

## 2023-06-16 DIAGNOSIS — Z5181 Encounter for therapeutic drug level monitoring: Secondary | ICD-10-CM

## 2023-06-16 DIAGNOSIS — G90521 Complex regional pain syndrome I of right lower limb: Secondary | ICD-10-CM

## 2023-06-16 MED ORDER — OXYCODONE HCL 10 MG PO TABS: 10.0000 mg | ORAL_TABLET | Freq: Every day | ORAL | 0 refills | Status: DC | PRN

## 2023-06-17 ENCOUNTER — Other Ambulatory Visit: Payer: Self-pay | Admitting: *Deleted

## 2023-06-17 DIAGNOSIS — R399 Unspecified symptoms and signs involving the genitourinary system: Secondary | ICD-10-CM

## 2023-06-20 LAB — TOXASSURE SELECT,+ANTIDEPR,UR

## 2023-06-23 ENCOUNTER — Other Ambulatory Visit: Payer: 59

## 2023-07-12 ENCOUNTER — Encounter: Payer: 59 | Attending: Registered Nurse | Admitting: Registered Nurse

## 2023-07-12 ENCOUNTER — Encounter: Payer: Self-pay | Admitting: Registered Nurse

## 2023-07-12 VITALS — BP 104/69 | HR 89 | Ht 67.0 in

## 2023-07-12 DIAGNOSIS — Z79891 Long term (current) use of opiate analgesic: Secondary | ICD-10-CM | POA: Diagnosis present

## 2023-07-12 DIAGNOSIS — M542 Cervicalgia: Secondary | ICD-10-CM

## 2023-07-12 DIAGNOSIS — G894 Chronic pain syndrome: Secondary | ICD-10-CM

## 2023-07-12 DIAGNOSIS — G90521 Complex regional pain syndrome I of right lower limb: Secondary | ICD-10-CM

## 2023-07-12 DIAGNOSIS — G8929 Other chronic pain: Secondary | ICD-10-CM | POA: Diagnosis present

## 2023-07-12 DIAGNOSIS — M5412 Radiculopathy, cervical region: Secondary | ICD-10-CM

## 2023-07-12 DIAGNOSIS — M25512 Pain in left shoulder: Secondary | ICD-10-CM

## 2023-07-12 DIAGNOSIS — Z5181 Encounter for therapeutic drug level monitoring: Secondary | ICD-10-CM | POA: Diagnosis present

## 2023-07-12 MED ORDER — OXYCODONE HCL 10 MG PO TABS: 10.0000 mg | ORAL_TABLET | Freq: Every day | ORAL | 0 refills | Status: DC | PRN

## 2023-07-12 NOTE — Progress Notes (Unsigned)
Subjective:    Patient ID: Sabrina Williams, female    DOB: 13-Jul-1970, 53 y.o.   MRN: [id number]  HPI: Sabrina Williams is a 53 y.o. female who returns for follow up appointment for chronic pain and medication refill. states *** pain is located in  ***. rates pain ***. current exercise regime is walking and performing stretching exercises.  Ms. [place] Morphine equivalent is *** MME.   Last UDS was Performed on 06/16/2023, it was consistent.     Pain Inventory Average Pain 4 Pain Right Now 3 My pain is constant, sharp, burning, dull, stabbing, tingling, and aching  In the last 24 hours, has pain interfered with the following? General activity 0 Relation with others 0 Enjoyment of life 3 What TIME of day is your pain at its worst? evening Sleep (in general) Fair  Pain is worse with: walking, standing, and some activites Pain improves with: rest, heat/ice, and medication Relief from Meds: 7  Family History  Problem Relation Age of Onset   Cancer Mother        multiple myeloma   Diabetes Father    Kidney disease Father    Heart disease Father    Social History   Socioeconomic History   Marital status: Married    Spouse name: Not on file   Number of children: 2   Years of education: some college   Highest education level: Not on file  Occupational History   Occupation: homemaker  Tobacco Use   Smoking status: Never   Smokeless tobacco: Never  Vaping Use   Vaping status: Never Used  Substance and Sexual Activity   Alcohol use: No    Alcohol/week: 0.0 standard drinks of alcohol   Drug use: No   Sexual activity: Not on file  Other Topics Concern   Not on file  Social History Narrative   Lives at home with husband.   Right-handed.   No daily caffeine use.   Social Drivers of Corporate investment banker Strain: Not on file  Food Insecurity: No Food Insecurity (06/21/2022)   Hunger Vital Sign    Worried About Running Out of Food in the Last Year: Never true     Ran Out of Food in the Last Year: Never true  Transportation Needs: No Transportation Needs (06/21/2022)   PRAPARE - Administrator, Civil Service (Medical): No    Lack of Transportation (Non-Medical): No  Physical Activity: Not on file  Stress: Not on file  Social Connections: Not on file   Past Surgical History:  Procedure Laterality Date   CERVICAL SPINE SURGERY  2005   C6-7 fusion   CESAREAN SECTION     x2   CYSTOSCOPY WITH RETROGRADE PYELOGRAM, URETEROSCOPY AND STENT PLACEMENT Left 06/21/2022   Procedure: CYSTOSCOPY WITH RETROGRADE PYELOGRAM, URETEROSCOPY AND STENT PLACEMENT;  Surgeon: Vanna Scotland, MD;  Location: [facility];  Service: Urology;  Laterality: Left;   CYSTOSCOPY/URETEROSCOPY/HOLMIUM LASER/STENT PLACEMENT Left 07/06/2022   Procedure: CYSTOSCOPY/URETEROSCOPY/HOLMIUM LASER/STENT EXCHANGE;  Surgeon: Riki Altes, MD;  Location: [facility];  Service: Urology;  Laterality: Left;   dental implant  11/2013   ENDOSCOPIC PLANTAR FASCIOTOMY Right 12/2013   HAND SURGERY Right    KNEE ARTHROSCOPY  1985   LITHOTRIPSY     multiple   PARTIAL GASTRECTOMY  01/01/2016   PLANTAR FASCIECTOMY Right    SHOULDER SURGERY Left 10/2004   SMALL INTESTINE SURGERY     cervical laminectomy   utereroscopy  1995  Past Surgical History:  Procedure Laterality Date   CERVICAL SPINE SURGERY  2005   C6-7 fusion   CESAREAN SECTION     x2   CYSTOSCOPY WITH RETROGRADE PYELOGRAM, URETEROSCOPY AND STENT PLACEMENT Left 06/21/2022   Procedure: CYSTOSCOPY WITH RETROGRADE PYELOGRAM, URETEROSCOPY AND STENT PLACEMENT;  Surgeon: Vanna Scotland, MD;  Location: [facility];  Service: Urology;  Laterality: Left;   CYSTOSCOPY/URETEROSCOPY/HOLMIUM LASER/STENT PLACEMENT Left 07/06/2022   Procedure: CYSTOSCOPY/URETEROSCOPY/HOLMIUM LASER/STENT EXCHANGE;  Surgeon: Riki Altes, MD;  Location: [facility];  Service: Urology;  Laterality: Left;   dental implant  11/2013   ENDOSCOPIC PLANTAR  FASCIOTOMY Right 12/2013   HAND SURGERY Right    KNEE ARTHROSCOPY  1985   LITHOTRIPSY     multiple   PARTIAL GASTRECTOMY  01/01/2016   PLANTAR FASCIECTOMY Right    SHOULDER SURGERY Left 10/2004   SMALL INTESTINE SURGERY     cervical laminectomy   utereroscopy  1995   Past Medical History:  Diagnosis Date   Alopecia    Anemia    Anxiety    Cervical radiculopathy at C6    Chronic kidney disease    stones   Chronic pain    Complex regional pain syndrome I    right foot   Depression    Diabetes mellitus type 2, uncomplicated (HCC)    GERD (gastroesophageal reflux disease)    Heart murmur    History of kidney stones    Hypercholesterolemia    Hypertension, essential, benign    Kidney stone    Morbid obesity (HCC)    Neuromuscular disorder (HCC)    Plantar fasciitis    Stage 4 chronic kidney disease (HCC)    Steatosis of liver    Tremor    Vitamin D deficiency    BP 104/69   Pulse 89   Ht 5\' 7"  (1.702 m)   LMP 12/02/2019   SpO2 98%   BMI 41.50 kg/m   Opioid Risk Score:   Fall Risk Score:  `1  Depression screen PHQ 2/9     07/12/2023    2:21 PM 06/16/2023    2:30 PM 02/23/2023    8:47 AM 01/27/2023    9:23 AM 12/27/2022   10:53 AM 12/01/2022   10:21 AM 08/31/2022    3:04 PM  Depression screen PHQ 2/9  Decreased Interest 0 0 1 0 0 0 0  Down, Depressed, Hopeless 0 0 1 0 0 0 0  PHQ - 2 Score 0 0 2 0 0 0 0     Review of Systems  Musculoskeletal:  Positive for neck pain.       Left arm and right knee   All other systems reviewed and are negative.      Objective:   Physical Exam        Assessment & Plan:  1.Complex regional pain syndrome right lower extremity postoperative. She's s/p post plantar fascial release. She continue with sensitivity to touch. She has diffuse numbness and tingling.Continue Gabapentin . Marland Kitchen 06/16/2023. Nucynta discontinued due to Urine Clearance. .We will continue the opioid monitoring program, this consists of regular clinic visits,  examinations, urine drug screen, pill counts as well as use of [place] Controlled Substance Reporting system. A 12 month History has been reviewed on the [place] Controlled Substance Reporting System on 06/16/2023. 2.Myofascial pain syndrome: Continue current medication regime with Tizanidine, only at HS while prescribe Methocarbamol by Ortho, she verbalizes understanding. Continue with ice, heat and exercise regime. 06/16/2023 3. Depression:  Continue: Zoloft. PCP Following. 06/16/2023 4. Cervical Post Laminectomy: Continue to Monitor. 06/16/2023 5. Cervicalgia/Cervical  Radiculopathy/ Left shoulder, Left Arm and Left  Hand Pain with tingling,  . Continue to monitor. 06/16/2023 6. Poly Neuropathy: Continue current medication regimen with Nortriptyline  and :Continue to Monitor. 06/16/2023 7. Left Shoulder Tendonitis: Chronic Left Shoulder Pain:  Ortho Following. Continue current medication regime and Continue  to Monitor. 06/16/2023 8. Right hand pain/ Post Surgical  Procedure: Trigger Finger Release: Dr. Ardyth Harps Following.  No complaints Today. 06/16/2023. 9. Tremor: No complaints today. Gabapentin being weaned slowly. Continue to monitor.  06/16/2023 10. Chronic Right   Knee Pain: Orthopedics Following: Continue with HEP as Tolerated. Continue to Monitor. 06/16/2023 11. Chronic Right  Foot Pain. Podiatry Following.Continue with HEP as tolerated. Continue current medication regimen. Continue to monitor. 06/16/2023  12. Left Greater Trochanter Bursitis: No complaints today. Continue to alternate Ice and Heat Therapy. Continue to Monitor. 06/16/2023  13. Right Lateral Epicondylitis:  No complaints today. Ortho Following. Continue to Monitor.  06/16/2023  14/ Fall subsequent encounter/ No falls: . RX : Oxycodone 10 mg one tablet 5 times a day as needed for pain. #150. We will continue with slow weaning, she verbalizes understanding. With a goal to resume Oxycodone 10 mg 4 times a day as  needed for pain. We will continue the opioid monitoring program, this consists of regular clinic visits, examinations, urine drug screen, pill counts as well as use of [place] Controlled Substance Reporting system. A 12 month History has been reviewed on the [place] Controlled Substance Reporting System on 06/16/2023.  15. Left Elbow Pain: Ortho Following. Continue Physical Therapy. 06/16/2023 16. Neuropathic Pain: Continue Gabapentin as prescribed. Continue To Monitor. 06/16/2023      F/U in 1 month

## 2023-07-17 ENCOUNTER — Other Ambulatory Visit: Payer: Self-pay | Admitting: Registered Nurse

## 2023-08-02 ENCOUNTER — Encounter: Payer: Self-pay | Admitting: Physician Assistant

## 2023-08-02 ENCOUNTER — Ambulatory Visit (INDEPENDENT_AMBULATORY_CARE_PROVIDER_SITE_OTHER): Admitting: Physician Assistant

## 2023-08-02 VITALS — BP 117/78 | HR 91 | Ht 67.0 in | Wt 260.0 lb

## 2023-08-02 DIAGNOSIS — N390 Urinary tract infection, site not specified: Secondary | ICD-10-CM

## 2023-08-02 LAB — URINALYSIS, COMPLETE
Bilirubin, UA: NEGATIVE
Glucose, UA: NEGATIVE
Ketones, UA: NEGATIVE
Nitrite, UA: NEGATIVE
Protein,UA: NEGATIVE
Specific Gravity, UA: 1.01 (ref 1.005–1.030)
Urobilinogen, Ur: 0.2 mg/dL (ref 0.2–1.0)
pH, UA: 5.5 (ref 5.0–7.5)

## 2023-08-02 LAB — MICROSCOPIC EXAMINATION: WBC, UA: >30 /HPF — AB (ref 0–5)

## 2023-08-02 MED ORDER — ESTRADIOL 0.1 MG/GM VA CREA
TOPICAL_CREAM | VAGINAL | 12 refills | Status: AC
Start: 2023-08-02 — End: ?

## 2023-08-02 MED ORDER — CEFUROXIME AXETIL 250 MG PO TABS: 250.0000 mg | ORAL_TABLET | Freq: Two times a day (BID) | ORAL | 0 refills | Status: AC

## 2023-08-02 NOTE — Progress Notes (Signed)
 08/02/2023 1:04 PM   Sabrina Williams 1971-01-24 [id number]  CC: Chief Complaint  Patient presents with   Buring with urination   HPI: Sabrina Williams is a 53 y.o. female with PMH CKD 4, DM 2, and recurrent nephrolithiasis with a septic obstructing left UPJ stone in February 2024 who presents today for evaluation of possible UTI.   Today she reports several days of bladder pressure/burning discomfort consistent with past UTIs.  She took a home test, which was positive.  She has not taken any medication yet for this.  She is taking cranberry daily for UTI prevention and wonders what else she can do.  In-office UA today positive for trace intact blood and 2+ leukocytes; urine microscopy with >30 WBCs/HPF.  PMH: Past Medical History:  Diagnosis Date   Alopecia    Anemia    Anxiety    Cervical radiculopathy at C6    Chronic kidney disease    stones   Chronic pain    Complex regional pain syndrome I    right foot   Depression    Diabetes mellitus type 2, uncomplicated (HCC)    GERD (gastroesophageal reflux disease)    Heart murmur    History of kidney stones    Hypercholesterolemia    Hypertension, essential, benign    Kidney stone    Morbid obesity (HCC)    Neuromuscular disorder (HCC)    Plantar fasciitis    Stage 4 chronic kidney disease (HCC)    Steatosis of liver    Tremor    Vitamin D deficiency     Surgical History: Past Surgical History:  Procedure Laterality Date   CERVICAL SPINE SURGERY  2005   C6-7 fusion   CESAREAN SECTION     x2   CYSTOSCOPY WITH RETROGRADE PYELOGRAM, URETEROSCOPY AND STENT PLACEMENT Left 06/21/2022   Procedure: CYSTOSCOPY WITH RETROGRADE PYELOGRAM, URETEROSCOPY AND STENT PLACEMENT;  Surgeon: Vanna Scotland, MD;  Location: [facility];  Service: Urology;  Laterality: Left;   CYSTOSCOPY/URETEROSCOPY/HOLMIUM LASER/STENT PLACEMENT Left 07/06/2022   Procedure: CYSTOSCOPY/URETEROSCOPY/HOLMIUM LASER/STENT EXCHANGE;  Surgeon: Riki Altes, MD;  Location: [facility];  Service: Urology;  Laterality: Left;   dental implant  11/2013   ENDOSCOPIC PLANTAR FASCIOTOMY Right 12/2013   HAND SURGERY Right    KNEE ARTHROSCOPY  1985   LITHOTRIPSY     multiple   PARTIAL GASTRECTOMY  01/01/2016   PLANTAR FASCIECTOMY Right    SHOULDER SURGERY Left 10/2004   SMALL INTESTINE SURGERY     cervical laminectomy   utereroscopy  1995    Home Medications:  Allergies as of 08/02/2023       Reactions   Penicillins Hives, Rash   Nsaids    Other Reaction(s): Not available        Medication List        Accurate as of August 02, 2023  1:04 PM. If you have any questions, ask your nurse or doctor.          acetaminophen 500 MG tablet Commonly known as: TYLENOL Take by mouth.   amLODipine 2.5 MG tablet Commonly known as: NORVASC Take 2.5 mg by mouth 2 (two) times daily.   Bariatric Multivitamins/Iron Caps Take 3 capsules by mouth daily before supper.   benazepril 20 MG tablet Commonly known as: LOTENSIN Take 20 mg by mouth daily. 1/2 a pill   calcitRIOL 0.5 MCG capsule Commonly known as: ROCALTROL Take 0.5 mcg by mouth in the morning and at bedtime.   calcium  carbonate 1250 MG capsule Take 1,250 mg by mouth daily.   cefUROXime 250 MG tablet Commonly known as: CEFTIN Take 1 tablet (250 mg total) by mouth 2 (two) times daily with a meal for 5 days. Started by: Carman Ching   DULoxetine 60 MG capsule Commonly known as: CYMBALTA Take 60 mg by mouth daily.   estradiol 0.1 MG/GM vaginal cream Commonly known as: ESTRACE Apply one pea-sized amount around the opening of the urethra daily for 2 weeks, then 3 times weekly moving forward. Started by: Carman Ching   ferrous sulfate 325 (65 FE) MG tablet Take 325 mg by mouth daily with breakfast.   Fingerstix Lancets Misc Use 1 each 2 (two) times daily As directed [Test blood sugars in rotating pattern:  fasting, before meals, 2 hours after a meal, at  bedtime]   gabapentin 100 MG capsule Commonly known as: NEURONTIN TAKE 1 CAPSULE BY MOUTH EVERY DAY   glucose blood test strip USE 1 STRIP TWICE A DAY AS DIRECTED   Lagevrio 200 MG Caps capsule Generic drug: molnupiravir EUA TAKE 4 CAPSULES (800 MG TOTAL) BY MOUTH TWICE A DAY FOR 5 DAYS   lovastatin 20 MG tablet Commonly known as: MEVACOR Take 20 mg by mouth every evening.   magnesium oxide 400 (240 Mg) MG tablet Commonly known as: MAG-OX Take 1 tablet by mouth daily.   methocarbamol 500 MG tablet Commonly known as: ROBAXIN Take 500 mg by mouth 3 (three) times daily.   methylPREDNISolone 4 MG Tbpk tablet Commonly known as: MEDROL DOSEPAK SMARTSIG:- Tablet(s) By Mouth -   nortriptyline 75 MG capsule Commonly known as: PAMELOR Take 1 capsule (75 mg total) by mouth at bedtime.   omeprazole 10 MG capsule Commonly known as: PRILOSEC Take 1 tablet by mouth daily.   ondansetron 4 MG tablet Commonly known as: ZOFRAN TAKE 1 TABLET (4 MG TOTAL) BY MOUTH 2 (TWO) TIMES DAILY AS NEEDED FOR NAUSEA OR VOMITING.   oxybutynin 10 MG 24 hr tablet Commonly known as: DITROPAN-XL TAKE 1 TABLET BY MOUTH EVERYDAY AT BEDTIME   Oxycodone HCl 10 MG Tabs Take 1 tablet (10 mg total) by mouth 5 (five) times daily as needed. Slowly weaning from Oxycodone 15 mg tablets   patiromer 8.4 g packet Commonly known as: VELTASSA Take by mouth.   tamsulosin 0.4 MG Caps capsule Commonly known as: FLOMAX Take 1 capsule (0.4 mg total) by mouth daily.   tiZANidine 4 MG tablet Commonly known as: ZANAFLEX TAKE 1 TABLET (4 MG TOTAL) BY MOUTH 5 (FIVE) TIMES DAILY AS NEEDED FOR MUSCLE SPASMS.   torsemide 5 MG tablet Commonly known as: DEMADEX Take 5 mg by mouth 2 (two) times daily.   VITAMIN C (CALCIUM ASCORBATE) PO   Vitamin D (Ergocalciferol) 1.25 MG (50000 UNIT) Caps capsule Commonly known as: DRISDOL TAKE 1 CAPSULE (50,000 UNITS TOTAL) BY MOUTH TWICE A WEEK FOR 30 DAYS   VITAMIN D3 PO Take  2,000 Units by mouth daily.        Allergies:  Allergies  Allergen Reactions   Penicillins Hives and Rash   Nsaids     Other Reaction(s): Not available    Family History: Family History  Problem Relation Age of Onset   Cancer Mother        multiple myeloma   Diabetes Father    Kidney disease Father    Heart disease Father     Social History:   reports that she has never smoked. She has never used smokeless  tobacco. She reports that she does not drink alcohol and does not use drugs.  Physical Exam: BP 117/78   Pulse 91   Ht 5\' 7"  (1.702 m)   Wt 260 lb (117.9 kg)   LMP 12/02/2019   BMI 40.72 kg/m   Constitutional:  Alert and oriented, no acute distress, nontoxic appearing HEENT: [place], AT Cardiovascular: No clubbing, cyanosis, or edema Respiratory: Normal respiratory effort, no increased work of breathing Skin: No rashes, bruises or suspicious lesions Neurologic: Grossly intact, no focal deficits, moving all 4 extremities Psychiatric: Normal mood and affect  Laboratory Data: Results for orders placed or performed in visit on 08/02/23  Microscopic Examination   Collection Time: 08/02/23 10:42 AM   Urine  Result Value Ref Range   WBC, UA >30 (A) 0 - 5 /hpf   RBC, Urine 0-2 0 - 2 /hpf   Epithelial Cells (non renal) 0-10 0 - 10 /hpf   Bacteria, UA Few None seen/Few  Urinalysis, Complete   Collection Time: 08/02/23 10:42 AM  Result Value Ref Range   Specific Gravity, UA 1.010 1.005 - 1.030   pH, UA 5.5 5.0 - 7.5   Color, UA Yellow Yellow   Appearance Ur Cloudy (A) Clear   Leukocytes,UA 2+ (A) Negative   Protein,UA Negative Negative/Trace   Glucose, UA Negative Negative   Ketones, UA Negative Negative   RBC, UA Trace (A) Negative   Bilirubin, UA Negative Negative   Urobilinogen, Ur 0.2 0.2 - 1.0 mg/dL   Nitrite, UA Negative Negative   Microscopic Examination See below:    Assessment & Plan:   1. Recurrent UTI (Primary) UA notable for pyuria.  Will start  empiric cefuroxime and send for culture for further evaluation.  We discussed recurrent UTI prevention strategies including staying well-hydrated, continuing cranberry supplements, starting a lactobacillus containing probiotic, and starting topical vaginal estrogen cream.  Additional info added to her AVS today. - Urinalysis, Complete - CULTURE, URINE COMPREHENSIVE - estradiol (ESTRACE) 0.1 MG/GM vaginal cream; Apply one pea-sized amount around the opening of the urethra daily for 2 weeks, then 3 times weekly moving forward.  Dispense: 42.5 g; Refill: 12 - cefUROXime (CEFTIN) 250 MG tablet; Take 1 tablet (250 mg total) by mouth 2 (two) times daily with a meal for 5 days.  Dispense: 10 tablet; Refill: 0  Return if symptoms worsen or fail to improve.  Carman Ching, PA-C  [facility] [place] [address], [address] [place], [place] [address] [phone]

## 2023-08-02 NOTE — Patient Instructions (Signed)
 Recurrent UTI Prevention Strategies  Stay well hydrated. Continue taking an over-the-counter cranberry supplement for urinary tract health. Take this once or twice daily on an empty stomach, e.g. right before bed. Start taking an over-the-counter probiotic containing the bacterial species called Lactobacillus. Take this daily. Start vaginal estrogen cream. Apply a pea-sized amount around the opening of the urethra every day for 2 weeks, then three times weekly forever.

## 2023-08-05 LAB — CULTURE, URINE COMPREHENSIVE

## 2023-08-09 ENCOUNTER — Encounter: Payer: 59 | Admitting: Registered Nurse

## 2023-08-10 NOTE — Progress Notes (Signed)
 Subjective:    Patient ID: Sabrina Williams, female    DOB: 07-Dec-1970, 53 y.o.   MRN: [id number]  HPI: Sabrina Williams is a 53 y.o. female who returns for follow up appointment for chronic pain and medication refill. She states her pain is located in her neck radiating into her left shoulder, , left arm and left elbow, with achy pain. She denies any chest pain, right knee pain and right foot with tingling burning. She rates her pain 4. Her current exercise regime is walking and performing stretching exercises.  Ms. Cashatt Morphine equivalent is 75:00 MME.   Last UDS was Performed on 06/16/2023, it was consistent.    Pain Inventory Average Pain 4 Pain Right Now 4 My pain is constant, sharp, burning, dull, tingling, and aching  In the last 24 hours, has pain interfered with the following? General activity 8 Relation with others 0 Enjoyment of life 5 What TIME of day is your pain at its worst? evening Sleep (in general) Fair  Pain is worse with: walking, standing, some activites, and reaching  Pain improves with: rest, heat/ice, therapy/exercise, and medication Relief from Meds: 7  Family History  Problem Relation Age of Onset   Cancer Mother        multiple myeloma   Diabetes Father    Kidney disease Father    Heart disease Father    Social History   Socioeconomic History   Marital status: Married    Spouse name: Not on file   Number of children: 2   Years of education: some college   Highest education level: Not on file  Occupational History   Occupation: homemaker  Tobacco Use   Smoking status: Never   Smokeless tobacco: Never  Vaping Use   Vaping status: Never Used  Substance and Sexual Activity   Alcohol use: No    Alcohol/week: 0.0 standard drinks of alcohol   Drug use: No   Sexual activity: Not on file  Other Topics Concern   Not on file  Social History Narrative   Lives at home with husband.   Right-handed.   No daily caffeine use.   Social  Drivers of Corporate investment banker Strain: Low Risk  (07/20/2023)   Received from [facility]   Overall Financial Resource Strain (CARDIA)    Difficulty of Paying Living Expenses: Not hard at all  Food Insecurity: No Food Insecurity (07/20/2023)   Received from [facility]   Hunger Vital Sign    Worried About Running Out of Food in the Last Year: Never true    Ran Out of Food in the Last Year: Never true  Transportation Needs: No Transportation Needs (07/20/2023)   Received from [facility] - Transportation    In the past 12 months, has lack of transportation kept you from medical appointments or from getting medications?: No    Lack of Transportation (Non-Medical): No  Physical Activity: Not on file  Stress: Not on file  Social Connections: Not on file   Past Surgical History:  Procedure Laterality Date   CERVICAL SPINE SURGERY  2005   C6-7 fusion   CESAREAN SECTION     x2   CYSTOSCOPY WITH RETROGRADE PYELOGRAM, URETEROSCOPY AND STENT PLACEMENT Left 06/21/2022   Procedure: CYSTOSCOPY WITH RETROGRADE PYELOGRAM, URETEROSCOPY AND STENT PLACEMENT;  Surgeon: Vanna Scotland, MD;  Location: [facility];  Service: Urology;  Laterality: Left;   CYSTOSCOPY/URETEROSCOPY/HOLMIUM LASER/STENT PLACEMENT Left  07/06/2022   Procedure: CYSTOSCOPY/URETEROSCOPY/HOLMIUM LASER/STENT EXCHANGE;  Surgeon: Riki Altes, MD;  Location: [facility];  Service: Urology;  Laterality: Left;   dental implant  11/2013   ENDOSCOPIC PLANTAR FASCIOTOMY Right 12/2013   HAND SURGERY Right    KNEE ARTHROSCOPY  1985   LITHOTRIPSY     multiple   PARTIAL GASTRECTOMY  01/01/2016   PLANTAR FASCIECTOMY Right    SHOULDER SURGERY Left 10/2004   SMALL INTESTINE SURGERY     cervical laminectomy   utereroscopy  1995   Past Surgical History:  Procedure Laterality Date   CERVICAL SPINE SURGERY  2005   C6-7 fusion   CESAREAN SECTION     x2   CYSTOSCOPY WITH  RETROGRADE PYELOGRAM, URETEROSCOPY AND STENT PLACEMENT Left 06/21/2022   Procedure: CYSTOSCOPY WITH RETROGRADE PYELOGRAM, URETEROSCOPY AND STENT PLACEMENT;  Surgeon: Vanna Scotland, MD;  Location: [facility];  Service: Urology;  Laterality: Left;   CYSTOSCOPY/URETEROSCOPY/HOLMIUM LASER/STENT PLACEMENT Left 07/06/2022   Procedure: CYSTOSCOPY/URETEROSCOPY/HOLMIUM LASER/STENT EXCHANGE;  Surgeon: Riki Altes, MD;  Location: [facility];  Service: Urology;  Laterality: Left;   dental implant  11/2013   ENDOSCOPIC PLANTAR FASCIOTOMY Right 12/2013   HAND SURGERY Right    KNEE ARTHROSCOPY  1985   LITHOTRIPSY     multiple   PARTIAL GASTRECTOMY  01/01/2016   PLANTAR FASCIECTOMY Right    SHOULDER SURGERY Left 10/2004   SMALL INTESTINE SURGERY     cervical laminectomy   utereroscopy  1995   Past Medical History:  Diagnosis Date   Alopecia    Anemia    Anxiety    Cervical radiculopathy at C6    Chronic kidney disease    stones   Chronic pain    Complex regional pain syndrome I    right foot   Depression    Diabetes mellitus type 2, uncomplicated (HCC)    GERD (gastroesophageal reflux disease)    Heart murmur    History of kidney stones    Hypercholesterolemia    Hypertension, essential, benign    Kidney stone    Morbid obesity (HCC)    Neuromuscular disorder (HCC)    Plantar fasciitis    Stage 4 chronic kidney disease (HCC)    Steatosis of liver    Tremor    Vitamin D deficiency    LMP 12/02/2019   Today's Vitals   08/11/23 1457  BP: 109/75  Pulse: 82  Resp: 16  SpO2: 99%  Weight: 268 lb 3.2 oz (121.7 kg)  Height: 5\' 7"  (1.702 m)   Body mass index is 42.01 kg/m.   Opioid Risk Score:   Fall Risk Score:  `1  Depression screen PHQ 2/9     07/12/2023    2:21 PM 06/16/2023    2:30 PM 02/23/2023    8:47 AM 01/27/2023    9:23 AM 12/27/2022   10:53 AM 12/01/2022   10:21 AM 08/31/2022    3:04 PM  Depression screen PHQ 2/9  Decreased Interest 0 0 1 0 0 0 0  Down, Depressed,  Hopeless 0 0 1 0 0 0 0  PHQ - 2 Score 0 0 2 0 0 0 0    Review of Systems  Musculoskeletal:        Right shoulder and arm Left knee, leg and foot        Objective:   Physical Exam Vitals and nursing note reviewed.  Constitutional:      Appearance: Normal appearance.  Cardiovascular:     Rate  and Rhythm: Normal rate and regular rhythm.     Pulses: Normal pulses.     Heart sounds: Normal heart sounds.  Pulmonary:     Effort: Pulmonary effort is normal.     Breath sounds: Normal breath sounds.  Musculoskeletal:     Comments: Normal Muscle Bulk and Muscle Testing Reveals:  Upper Extremities: Full ROM and Muscle Strength 5/5 Left AC Joint Tenderness Lower Extremities: Full ROM and Muscle Strength 5/5 Arises from Table with Ease Narrow Based  Gait     Skin:    General: Skin is warm and dry.  Neurological:     Mental Status: She is alert and oriented to person, place, and time.  Psychiatric:        Mood and Affect: Mood normal.        Behavior: Behavior normal.         Assessment & Plan:  1.Complex regional pain syndrome right lower extremity postoperative. She's s/p post plantar fascial release. She continue with sensitivity to touch. She has diffuse numbness and tingling.Continue Gabapentin . . 08/11/2023. Nucynta discontinued due to Urine Clearance. .We will continue the opioid monitoring program, this consists of regular clinic visits, examinations, urine drug screen, pill counts as well as use of [place] Controlled Substance Reporting system. A 12 month History has been reviewed on the [place] Controlled Substance Reporting System on 08/11/2023. 2.Myofascial pain syndrome: Continue current medication regime with Tizanidine, only at HS while prescribe Methocarbamol by Ortho, she verbalizes understanding. Continue with ice, heat and exercise regime. 08/11/2023 3. Depression: Continue: Zoloft. PCP Following. 08/11/2023 4. Cervical Post Laminectomy: Continue to  Monitor. 08/11/2023 5. Cervicalgia/Cervical  Radiculopathy/ Left shoulder, Left Arm and Left  Hand Pain with tingling,  . Continue to monitor. 03/ 20/2025 6. Poly Neuropathy: Continue current medication regimen with Nortriptyline  and :Continue to Monitor. 08/11/2023 7. Left Shoulder Tendonitis: Chronic Left Shoulder Pain:  Ortho Following. Continue current medication regime and Continue  to Monitor. 08/11/2023 8. Right hand pain/ Post Surgical  Procedure: Trigger Finger Release: Dr. Ardyth Harps Following.  No complaints Today. 08/11/2023. 9. Tremor: No complaints today. Gabapentin being weaned slowly. Continue to monitor.  08/11/2023 10. Chronic Right   Knee Pain: Orthopedics Following: Continue with HEP as Tolerated. Continue to Monitor. 08/11/2023 11. Chronic Right  Foot Pain. Podiatry Following.Continue with HEP as tolerated. Continue current medication regimen. Continue to monitor. 08/11/2023  12. Left Greater Trochanter Bursitis: No complaints today. Continue to alternate Ice and Heat Therapy. Continue to Monitor. 08/11/2023  13. Right Lateral Epicondylitis:  No complaints today. Ortho Following. Continue to Monitor.  08/11/2023  14/ Fall subsequent encounter/ No falls: . Refilled : Oxycodone 10 mg one tablet 5 times a day as needed for pain. #150. We will continue with slow weaning, she verbalizes understanding. With a goal to resume Oxycodone 10 mg 4 times a day as needed for pain. We will continue the opioid monitoring program, this consists of regular clinic visits, examinations, urine drug screen, pill counts as well as use of [place] Controlled Substance Reporting system. A 12 month History has been reviewed on the [place] Controlled Substance Reporting System on 08/11/2023.  15. Left Elbow Pain: Ortho Following. Continue Physical Therapy. 08/11/2023 16. Neuropathic Pain: Continue Gabapentin as prescribed. Continue To Monitor. 08/11/2023      F/U in 1 month

## 2023-08-11 ENCOUNTER — Encounter: Attending: Registered Nurse | Admitting: Registered Nurse

## 2023-08-11 VITALS — BP 109/75 | HR 82 | Resp 16 | Ht 67.0 in | Wt 268.2 lb

## 2023-08-11 DIAGNOSIS — M542 Cervicalgia: Secondary | ICD-10-CM | POA: Insufficient documentation

## 2023-08-11 DIAGNOSIS — Z5181 Encounter for therapeutic drug level monitoring: Secondary | ICD-10-CM | POA: Insufficient documentation

## 2023-08-11 DIAGNOSIS — M792 Neuralgia and neuritis, unspecified: Secondary | ICD-10-CM | POA: Insufficient documentation

## 2023-08-11 DIAGNOSIS — G8929 Other chronic pain: Secondary | ICD-10-CM | POA: Diagnosis present

## 2023-08-11 DIAGNOSIS — M1711 Unilateral primary osteoarthritis, right knee: Secondary | ICD-10-CM | POA: Diagnosis present

## 2023-08-11 DIAGNOSIS — M25512 Pain in left shoulder: Secondary | ICD-10-CM | POA: Diagnosis not present

## 2023-08-11 DIAGNOSIS — Z79891 Long term (current) use of opiate analgesic: Secondary | ICD-10-CM | POA: Diagnosis present

## 2023-08-11 DIAGNOSIS — M5412 Radiculopathy, cervical region: Secondary | ICD-10-CM | POA: Insufficient documentation

## 2023-08-11 DIAGNOSIS — G90521 Complex regional pain syndrome I of right lower limb: Secondary | ICD-10-CM | POA: Insufficient documentation

## 2023-08-11 DIAGNOSIS — G894 Chronic pain syndrome: Secondary | ICD-10-CM | POA: Insufficient documentation

## 2023-08-11 MED ORDER — OXYCODONE HCL 10 MG PO TABS: 10.0000 mg | ORAL_TABLET | Freq: Every day | ORAL | 0 refills | Status: DC | PRN

## 2023-08-14 ENCOUNTER — Encounter: Payer: Self-pay | Admitting: Registered Nurse

## 2023-08-19 ENCOUNTER — Other Ambulatory Visit: Payer: Self-pay | Admitting: Gerontology

## 2023-08-19 DIAGNOSIS — Z1231 Encounter for screening mammogram for malignant neoplasm of breast: Secondary | ICD-10-CM

## 2023-09-28 NOTE — Progress Notes (Deleted)
 Subjective:    Patient ID: Sabrina Williams, female    DOB: January 19, 1971, 53 y.o.   MRN: [id number]  HPI  Pain Inventory Average Pain {NUMBERS; 0-10:5044} Pain Right Now {NUMBERS; 0-10:5044} My pain is {PAIN DESCRIPTION:21022940}  In the last 24 hours, has pain interfered with the following? General activity {NUMBERS; 0-10:5044} Relation with others {NUMBERS; 0-10:5044} Enjoyment of life {NUMBERS; 0-10:5044} What TIME of day is your pain at its worst? {time of day:24191} Sleep (in general) {BHH GOOD/FAIR/POOR:22877}  Pain is worse with: {ACTIVITIES:21022942} Pain improves with: {PAIN IMPROVES BJYN:82956213} Relief from Meds: {NUMBERS; 0-10:5044}  Family History  Problem Relation Age of Onset   Cancer Mother        multiple myeloma   Diabetes Father    Kidney disease Father    Heart disease Father    Social History   Socioeconomic History   Marital status: Married    Spouse name: Not on file   Number of children: 2   Years of education: some college   Highest education level: Not on file  Occupational History   Occupation: homemaker  Tobacco Use   Smoking status: Never   Smokeless tobacco: Never  Vaping Use   Vaping status: Never Used  Substance and Sexual Activity   Alcohol  use: No    Alcohol /week: 0.0 standard drinks of alcohol    Drug use: No   Sexual activity: Not on file  Other Topics Concern   Not on file  Social History Narrative   Lives at home with husband.   Right-handed.   No daily caffeine use.   Social Drivers of Corporate investment banker Strain: Low Risk  (08/16/2023)   Received from [facility]   Overall Financial Resource Strain (CARDIA)    Difficulty of Paying Living Expenses: Not hard at all  Food Insecurity: No Food Insecurity (08/16/2023)   Received from [facility]   Hunger Vital Sign    Worried About Running Out of Food in the Last Year: Never true    Ran Out of Food in the Last Year: Never  true  Transportation Needs: No Transportation Needs (08/16/2023)   Received from [facility] - Transportation    In the past 12 months, has lack of transportation kept you from medical appointments or from getting medications?: No    Lack of Transportation (Non-Medical): No  Physical Activity: Not on file  Stress: Not on file  Social Connections: Not on file   Past Surgical History:  Procedure Laterality Date   CERVICAL SPINE SURGERY  2005   C6-7 fusion   CESAREAN SECTION     x2   CYSTOSCOPY WITH RETROGRADE PYELOGRAM, URETEROSCOPY AND STENT PLACEMENT Left 06/21/2022   Procedure: CYSTOSCOPY WITH RETROGRADE PYELOGRAM, URETEROSCOPY AND STENT PLACEMENT;  Surgeon: Dustin Gimenez, MD;  Location: [facility];  Service: Urology;  Laterality: Left;   CYSTOSCOPY/URETEROSCOPY/HOLMIUM LASER/STENT PLACEMENT Left 07/06/2022   Procedure: CYSTOSCOPY/URETEROSCOPY/HOLMIUM LASER/STENT EXCHANGE;  Surgeon: Geraline Knapp, MD;  Location: [facility];  Service: Urology;  Laterality: Left;   dental implant  11/2013   ENDOSCOPIC PLANTAR FASCIOTOMY Right 12/2013   HAND SURGERY Right    KNEE ARTHROSCOPY  1985   LITHOTRIPSY     multiple   PARTIAL GASTRECTOMY  01/01/2016   PLANTAR FASCIECTOMY Right    SHOULDER SURGERY Left 10/2004   SMALL INTESTINE SURGERY     cervical laminectomy   utereroscopy  1995   Past Surgical History:  Procedure  Laterality Date   CERVICAL SPINE SURGERY  2005   C6-7 fusion   CESAREAN SECTION     x2   CYSTOSCOPY WITH RETROGRADE PYELOGRAM, URETEROSCOPY AND STENT PLACEMENT Left 06/21/2022   Procedure: CYSTOSCOPY WITH RETROGRADE PYELOGRAM, URETEROSCOPY AND STENT PLACEMENT;  Surgeon: Dustin Gimenez, MD;  Location: [facility];  Service: Urology;  Laterality: Left;   CYSTOSCOPY/URETEROSCOPY/HOLMIUM LASER/STENT PLACEMENT Left 07/06/2022   Procedure: CYSTOSCOPY/URETEROSCOPY/HOLMIUM LASER/STENT EXCHANGE;  Surgeon: Geraline Knapp, MD;  Location: [facility];  Service:  Urology;  Laterality: Left;   dental implant  11/2013   ENDOSCOPIC PLANTAR FASCIOTOMY Right 12/2013   HAND SURGERY Right    KNEE ARTHROSCOPY  1985   LITHOTRIPSY     multiple   PARTIAL GASTRECTOMY  01/01/2016   PLANTAR FASCIECTOMY Right    SHOULDER SURGERY Left 10/2004   SMALL INTESTINE SURGERY     cervical laminectomy   utereroscopy  1995   Past Medical History:  Diagnosis Date   Alopecia    Anemia    Anxiety    Cervical radiculopathy at C6    Chronic kidney disease    stones   Chronic pain    Complex regional pain syndrome I    right foot   Depression    Diabetes mellitus type 2, uncomplicated (HCC)    GERD (gastroesophageal reflux disease)    Heart murmur    History of kidney stones    Hypercholesterolemia    Hypertension, essential, benign    Kidney stone    Morbid obesity (HCC)    Neuromuscular disorder (HCC)    Plantar fasciitis    Stage 4 chronic kidney disease (HCC)    Steatosis of liver    Tremor    Vitamin D deficiency    LMP 12/02/2019   Opioid Risk Score:   Fall Risk Score:  `1  Depression screen PHQ 2/9     08/11/2023    2:57 PM 07/12/2023    2:21 PM 06/16/2023    2:30 PM 02/23/2023    8:47 AM 01/27/2023    9:23 AM 12/27/2022   10:53 AM 12/01/2022   10:21 AM  Depression screen PHQ 2/9  Decreased Interest 0 0 0 1 0 0 0  Down, Depressed, Hopeless 0 0 0 1 0 0 0  PHQ - 2 Score 0 0 0 2 0 0 0     Review of Systems     Objective:   Physical Exam        Assessment & Plan:

## 2023-09-29 ENCOUNTER — Encounter: Admitting: Registered Nurse

## 2023-10-06 ENCOUNTER — Encounter: Payer: Self-pay | Admitting: Registered Nurse

## 2023-10-06 ENCOUNTER — Encounter: Attending: Registered Nurse | Admitting: Registered Nurse

## 2023-10-06 VITALS — BP 95/64 | HR 87 | Ht 67.0 in | Wt 261.6 lb

## 2023-10-06 DIAGNOSIS — G90521 Complex regional pain syndrome I of right lower limb: Secondary | ICD-10-CM | POA: Insufficient documentation

## 2023-10-06 DIAGNOSIS — G894 Chronic pain syndrome: Secondary | ICD-10-CM | POA: Diagnosis present

## 2023-10-06 DIAGNOSIS — M542 Cervicalgia: Secondary | ICD-10-CM | POA: Diagnosis present

## 2023-10-06 DIAGNOSIS — M545 Low back pain, unspecified: Secondary | ICD-10-CM | POA: Insufficient documentation

## 2023-10-06 DIAGNOSIS — M5412 Radiculopathy, cervical region: Secondary | ICD-10-CM | POA: Diagnosis not present

## 2023-10-06 DIAGNOSIS — Z5181 Encounter for therapeutic drug level monitoring: Secondary | ICD-10-CM | POA: Insufficient documentation

## 2023-10-06 DIAGNOSIS — Z79891 Long term (current) use of opiate analgesic: Secondary | ICD-10-CM | POA: Diagnosis present

## 2023-10-06 DIAGNOSIS — M25512 Pain in left shoulder: Secondary | ICD-10-CM | POA: Insufficient documentation

## 2023-10-06 DIAGNOSIS — G8929 Other chronic pain: Secondary | ICD-10-CM | POA: Diagnosis present

## 2023-10-06 MED ORDER — OXYCODONE HCL 10 MG PO TABS: 10.0000 mg | ORAL_TABLET | Freq: Every day | ORAL | 0 refills | Status: DC | PRN

## 2023-10-06 NOTE — Progress Notes (Signed)
 Subjective:    Patient ID: Sabrina Williams, female    DOB: [birth year], 53 y.o.   MRN: [id number]  HPI: Sabrina Williams is a 53 y.o. female who returns for follow up appointment for chronic pain and medication refill. She states her pain is located in her neck radiating into her shoulder and ler arm with numbness, lower back mainly left side, right knee and right foot pain. She  rates her pain 5. Her current exercise regime is walking and performing stretching exercises.  Ms. Nadolny Morphine  equivalent is 75.00 MME.       Pain Inventory Average Pain 5 Pain Right Now 5 My pain is constant, sharp, burning, dull, stabbing, tingling, and aching  In the last 24 hours, has pain interfered with the following? General activity 7 Relation with others 4 Enjoyment of life 5 What TIME of day is your pain at its worst? evening Sleep (in general) Fair  Pain is worse with: walking, bending, standing, and some activites Pain improves with: rest, heat/ice, therapy/exercise, and medication Relief from Meds: 6  Family History  Problem Relation Age of Onset   Cancer Mother        multiple myeloma   Diabetes Father    Kidney disease Father    Heart disease Father    Social History   Socioeconomic History   Marital status: Married    Spouse name: Not on file   Number of children: 2   Years of education: some college   Highest education level: Not on file  Occupational History   Occupation: homemaker  Tobacco Use   Smoking status: Never   Smokeless tobacco: Never  Vaping Use   Vaping status: Never Used  Substance and Sexual Activity   Alcohol  use: No    Alcohol /week: 0.0 standard drinks of alcohol    Drug use: No   Sexual activity: Not on file  Other Topics Concern   Not on file  Social History Narrative   Lives at home with husband.   Right-handed.   No daily caffeine use.   Social Drivers of Corporate investment banker Strain: Low Risk  (08/16/2023)   Received from [facility]   Overall Financial Resource Strain (CARDIA)    Difficulty of Paying Living Expenses: Not hard at all  Food Insecurity: No Food Insecurity (08/16/2023)   Received from [facility]   Hunger Vital Sign    Worried About Running Out of Food in the Last Year: Never true    Ran Out of Food in the Last Year: Never true  Transportation Needs: No Transportation Needs (08/16/2023)   Received from [facility] - Transportation    In the past 12 months, has lack of transportation kept you from medical appointments or from getting medications?: No    Lack of Transportation (Non-Medical): No  Physical Activity: Not on file  Stress: Not on file  Social Connections: Not on file   Past Surgical History:  Procedure Laterality Date   CERVICAL SPINE SURGERY  2005   C6-7 fusion   CESAREAN SECTION     x2   CYSTOSCOPY WITH RETROGRADE PYELOGRAM, URETEROSCOPY AND STENT PLACEMENT Left 06/21/2022   Procedure: CYSTOSCOPY WITH RETROGRADE PYELOGRAM, URETEROSCOPY AND STENT PLACEMENT;  Surgeon: Dustin Gimenez, MD;  Location: [facility];  Service: Urology;  Laterality: Left;   CYSTOSCOPY/URETEROSCOPY/HOLMIUM LASER/STENT PLACEMENT Left 07/06/2022   Procedure: CYSTOSCOPY/URETEROSCOPY/HOLMIUM LASER/STENT EXCHANGE;  Surgeon: Geraline Knapp, MD;  Location:  [facility];  Service: Urology;  Laterality: Left;   dental implant  11/2013   ENDOSCOPIC PLANTAR FASCIOTOMY Right 12/2013   HAND SURGERY Right    KNEE ARTHROSCOPY  1985   LITHOTRIPSY     multiple   PARTIAL GASTRECTOMY  01/01/2016   PLANTAR FASCIECTOMY Right    SHOULDER SURGERY Left 10/2004   SMALL INTESTINE SURGERY     cervical laminectomy   utereroscopy  1995   Past Surgical History:  Procedure Laterality Date   CERVICAL SPINE SURGERY  2005   C6-7 fusion   CESAREAN SECTION     x2   CYSTOSCOPY WITH RETROGRADE PYELOGRAM, URETEROSCOPY AND STENT PLACEMENT Left 06/21/2022   Procedure:  CYSTOSCOPY WITH RETROGRADE PYELOGRAM, URETEROSCOPY AND STENT PLACEMENT;  Surgeon: Dustin Gimenez, MD;  Location: [facility];  Service: Urology;  Laterality: Left;   CYSTOSCOPY/URETEROSCOPY/HOLMIUM LASER/STENT PLACEMENT Left 07/06/2022   Procedure: CYSTOSCOPY/URETEROSCOPY/HOLMIUM LASER/STENT EXCHANGE;  Surgeon: Geraline Knapp, MD;  Location: [facility];  Service: Urology;  Laterality: Left;   dental implant  11/2013   ENDOSCOPIC PLANTAR FASCIOTOMY Right 12/2013   HAND SURGERY Right    KNEE ARTHROSCOPY  1985   LITHOTRIPSY     multiple   PARTIAL GASTRECTOMY  01/01/2016   PLANTAR FASCIECTOMY Right    SHOULDER SURGERY Left 10/2004   SMALL INTESTINE SURGERY     cervical laminectomy   utereroscopy  1995   Past Medical History:  Diagnosis Date   Alopecia    Anemia    Anxiety    Cervical radiculopathy at C6    Chronic kidney disease    stones   Chronic pain    Complex regional pain syndrome I    right foot   Depression    Diabetes mellitus type 2, uncomplicated (HCC)    GERD (gastroesophageal reflux disease)    Heart murmur    History of kidney stones    Hypercholesterolemia    Hypertension, essential, benign    Kidney stone    Morbid obesity (HCC)    Neuromuscular disorder (HCC)    Plantar fasciitis    Stage 4 chronic kidney disease (HCC)    Steatosis of liver    Tremor    Vitamin D deficiency    BP 95/64 (BP Location: Left Arm, Patient Position: Sitting, Cuff Size: Large)   Pulse 87   Ht 5\' 7"  (1.702 m)   Wt 261 lb 9.6 oz (118.7 kg)   LMP 12/02/2019   SpO2 95%   BMI 40.97 kg/m   Opioid Risk Score:   Fall Risk Score:  `1  Depression screen PHQ 2/9     10/06/2023    1:56 PM 08/11/2023    2:57 PM 07/12/2023    2:21 PM 06/16/2023    2:30 PM 02/23/2023    8:47 AM 01/27/2023    9:23 AM 12/27/2022   10:53 AM  Depression screen PHQ 2/9  Decreased Interest 0 0 0 0 1 0 0  Down, Depressed, Hopeless 0 0 0 0 1 0 0  PHQ - 2 Score 0 0 0 0 2 0 0  Altered sleeping 0        Tired,  decreased energy 0        Change in appetite 0        Feeling bad or failure about yourself  0        Trouble concentrating 0        Moving slowly or fidgety/restless 0        Suicidal  thoughts 0        PHQ-9 Score 0           Review of Systems  Musculoskeletal:  Positive for back pain, joint swelling and myalgias.       Left sided back pain, bilateral knee pain, left shoulder/arm pain, right foot pain  All other systems reviewed and are negative.      Objective:   Physical Exam Vitals and nursing note reviewed.  Constitutional:      Appearance: Normal appearance.  Cardiovascular:     Rate and Rhythm: Normal rate and regular rhythm.     Pulses: Normal pulses.     Heart sounds: Normal heart sounds.  Pulmonary:     Effort: Pulmonary effort is normal.     Breath sounds: Normal breath sounds.  Musculoskeletal:     Comments: Normal Muscle Bulk and Muscle Testing Reveals:  Upper Extremities: Full ROM and Muscle Strength 5/5 Left AC Joint Tenderness  Thoracic Paraspinal Tenderness: T-1-T-6 Mainly Left Side  Lower Extremities: Full ROM and Muscle Strength 5/5 Arises from Table with ease Narrow Based  Gait     Skin:    General: Skin is warm and dry.  Neurological:     Mental Status: She is alert and oriented to person, place, and time.  Psychiatric:        Mood and Affect: Mood normal.        Behavior: Behavior normal.         Assessment & Plan:  1.Complex regional pain syndrome right lower extremity postoperative. She's s/p post plantar fascial release. She continue with sensitivity to touch. She has diffuse numbness and tingling.Continue Gabapentin  . . 10/06/2023. Nucynta  discontinued due to Urine Clearance. .We will continue the opioid monitoring program, this consists of regular clinic visits, examinations, urine drug screen, pill counts as well as use of [place]  Controlled Substance Reporting system. A 12 month History has been reviewed on the    Controlled Substance Reporting System on 10/06/2023. 2.Myofascial pain syndrome: Continue current medication regime with Tizanidine , only at HS while prescribe Methocarbamol by Ortho, she verbalizes understanding. Continue with ice, heat and exercise regime. 10/06/2023 3. Depression: Continue: Zoloft. PCP Following. 10/06/2023 4. Cervical Post Laminectomy: Continue to Monitor. 10/06/2023 5. Cervicalgia/Cervical  Radiculopathy/ Left shoulder, Left Arm and Left  Hand Pain with tingling,  . Continue to monitor. 05/ 15/2025 6. Poly Neuropathy: Continue current medication regimen with Nortriptyline   and :Continue to Monitor. 10/06/2023 7. Left Shoulder Tendonitis: Chronic Left Shoulder Pain:  Ortho Following. Continue current medication regime and Continue  to Monitor. 10/06/2023 8. Right hand pain/ Post Surgical  Procedure: Trigger Finger Release: Dr. Ival Marines Following.  No complaints Today. 10/06/2023. 9. Tremor: No complaints today. Gabapentin  being weaned slowly. Continue to monitor.  10/06/2023 10. Chronic Right   Knee Pain: Orthopedics Following: Continue with HEP as Tolerated. Continue to Monitor. 10/06/2023 11. Chronic Right  Foot Pain. Podiatry Following.Continue with HEP as tolerated. Continue current medication regimen. Continue to monitor. 10/06/2023  12. Left Greater Trochanter Bursitis: No complaints today. Continue to alternate Ice and Heat Therapy. Continue to Monitor. 10/06/2023  13. Right Lateral Epicondylitis:  No complaints today. Ortho Following. Continue to Monitor.  10/06/2023  14/ Fall subsequent encounter/ No falls: . Refilled : Oxycodone  10 mg one tablet 5 times a day as needed for pain. #150. We will continue with slow weaning, she verbalizes understanding. With a goal to resume Oxycodone  10 mg 4 times a day as needed for pain.  We will continue the opioid monitoring program, this consists of regular clinic visits, examinations, urine drug screen, pill counts as well as use of  [place]  Controlled Substance Reporting system. A 12 month History has been reviewed on the [place]  Controlled Substance Reporting System on 10/06/2023.  15. Left Elbow Pain: No complaints today. Ortho Following. Continue Physical Therapy. 10/06/2023 16. Neuropathic Pain: Continue Gabapentin  as prescribed. Continue To Monitor. 10/06/2023      F/U in 1 month

## 2023-10-27 ENCOUNTER — Ambulatory Visit: Payer: Self-pay | Admitting: Urology

## 2023-11-01 ENCOUNTER — Telehealth: Payer: Self-pay | Admitting: Registered Nurse

## 2023-11-01 NOTE — Telephone Encounter (Signed)
 P called and stated she will be out of meds by Monday. I rescheduled her to Monday  Oxycodone  [facility]

## 2023-11-02 ENCOUNTER — Telehealth: Payer: Self-pay | Admitting: Registered Nurse

## 2023-11-02 MED ORDER — OXYCODONE HCL 10 MG PO TABS: 10.0000 mg | ORAL_TABLET | Freq: Every day | ORAL | 0 refills | Status: DC | PRN

## 2023-11-02 NOTE — Telephone Encounter (Signed)
 PMP was Reviewed.  Oxycodone  e-scribed to pharmacy.  Sabrina Williams is aware of the above via My-Chart message.

## 2023-11-03 ENCOUNTER — Encounter: Admitting: Registered Nurse

## 2023-11-04 NOTE — Progress Notes (Deleted)
 Subjective:    Patient ID: Sabrina Williams, female    DOB: Mar 11, 1971, 53 y.o.   MRN: [id number]  HPI   Pain Inventory Average Pain {NUMBERS; 0-10:5044} Pain Right Now {NUMBERS; 0-10:5044} My pain is {PAIN DESCRIPTION:21022940}  In the last 24 hours, has pain interfered with the following? General activity {NUMBERS; 0-10:5044} Relation with others {NUMBERS; 0-10:5044} Enjoyment of life {NUMBERS; 0-10:5044} What TIME of day is your pain at its worst? {time of day:24191} Sleep (in general) {BHH GOOD/FAIR/POOR:22877}  Pain is worse with: {ACTIVITIES:21022942} Pain improves with: {PAIN IMPROVES WUJW:11914782} Relief from Meds: {NUMBERS; 0-10:5044}  Family History  Problem Relation Age of Onset   Cancer Mother        multiple myeloma   Diabetes Father    Kidney disease Father    Heart disease Father    Social History   Socioeconomic History   Marital status: Married    Spouse name: Not on file   Number of children: 2   Years of education: some college   Highest education level: Not on file  Occupational History   Occupation: homemaker  Tobacco Use   Smoking status: Never   Smokeless tobacco: Never  Vaping Use   Vaping status: Never Used  Substance and Sexual Activity   Alcohol  use: No    Alcohol /week: 0.0 standard drinks of alcohol    Drug use: No   Sexual activity: Not on file  Other Topics Concern   Not on file  Social History Narrative   Lives at home with husband.   Right-handed.   No daily caffeine use.   Social Drivers of Corporate investment banker Strain: Low Risk  (08/16/2023)   Received from [facility]   Overall Financial Resource Strain (CARDIA)    Difficulty of Paying Living Expenses: Not hard at all  Food Insecurity: No Food Insecurity (08/16/2023)   Received from [facility]   Hunger Vital Sign    Worried About Running Out of Food in the Last Year: Never true    Ran Out of Food in the Last Year: Never  true  Transportation Needs: No Transportation Needs (08/16/2023)   Received from [facility] Transportation    In the past 12 months, has lack of transportation kept you from medical appointments or from getting medications?: No    Lack of Transportation (Non-Medical): No  Physical Activity: Not on file  Stress: Not on file  Social Connections: Not on file   Past Surgical History:  Procedure Laterality Date   CERVICAL SPINE SURGERY  2005   C6-7 fusion   CESAREAN SECTION     x2   CYSTOSCOPY WITH RETROGRADE PYELOGRAM, URETEROSCOPY AND STENT PLACEMENT Left 06/21/2022   Procedure: CYSTOSCOPY WITH RETROGRADE PYELOGRAM, URETEROSCOPY AND STENT PLACEMENT;  Surgeon: Dustin Gimenez, MD;  Location: [facility];  Service: Urology;  Laterality: Left;   CYSTOSCOPY/URETEROSCOPY/HOLMIUM LASER/STENT PLACEMENT Left 07/06/2022   Procedure: CYSTOSCOPY/URETEROSCOPY/HOLMIUM LASER/STENT EXCHANGE;  Surgeon: Geraline Knapp, MD;  Location: [facility];  Service: Urology;  Laterality: Left;   dental implant  11/2013   ENDOSCOPIC PLANTAR FASCIOTOMY Right 12/2013   HAND SURGERY Right    KNEE ARTHROSCOPY  1985   LITHOTRIPSY     multiple   PARTIAL GASTRECTOMY  01/01/2016   PLANTAR FASCIECTOMY Right    SHOULDER SURGERY Left 10/2004   SMALL INTESTINE SURGERY     cervical laminectomy   utereroscopy  1995   Past Surgical History:  Procedure Laterality Date   CERVICAL SPINE SURGERY  2005   C6-7 fusion   CESAREAN SECTION     x2   CYSTOSCOPY WITH RETROGRADE PYELOGRAM, URETEROSCOPY AND STENT PLACEMENT Left 06/21/2022   Procedure: CYSTOSCOPY WITH RETROGRADE PYELOGRAM, URETEROSCOPY AND STENT PLACEMENT;  Surgeon: Dustin Gimenez, MD;  Location: [facility];  Service: Urology;  Laterality: Left;   CYSTOSCOPY/URETEROSCOPY/HOLMIUM LASER/STENT PLACEMENT Left 07/06/2022   Procedure: CYSTOSCOPY/URETEROSCOPY/HOLMIUM LASER/STENT EXCHANGE;  Surgeon: Geraline Knapp, MD;  Location: [facility];  Service:  Urology;  Laterality: Left;   dental implant  11/2013   ENDOSCOPIC PLANTAR FASCIOTOMY Right 12/2013   HAND SURGERY Right    KNEE ARTHROSCOPY  1985   LITHOTRIPSY     multiple   PARTIAL GASTRECTOMY  01/01/2016   PLANTAR FASCIECTOMY Right    SHOULDER SURGERY Left 10/2004   SMALL INTESTINE SURGERY     cervical laminectomy   utereroscopy  1995   Past Medical History:  Diagnosis Date   Alopecia    Anemia    Anxiety    Cervical radiculopathy at C6    Chronic kidney disease    stones   Chronic pain    Complex regional pain syndrome I    right foot   Depression    Diabetes mellitus type 2, uncomplicated (HCC)    GERD (gastroesophageal reflux disease)    Heart murmur    History of kidney stones    Hypercholesterolemia    Hypertension, essential, benign    Kidney stone    Morbid obesity (HCC)    Neuromuscular disorder (HCC)    Plantar fasciitis    Stage 4 chronic kidney disease (HCC)    Steatosis of liver    Tremor    Vitamin D deficiency    LMP 12/02/2019   Opioid Risk Score:   Fall Risk Score:  `1  Depression screen PHQ 2/9     10/06/2023    1:56 PM 08/11/2023    2:57 PM 07/12/2023    2:21 PM 06/16/2023    2:30 PM 02/23/2023    8:47 AM 01/27/2023    9:23 AM 12/27/2022   10:53 AM  Depression screen PHQ 2/9  Decreased Interest 0 0 0 0 1 0 0  Down, Depressed, Hopeless 0 0 0 0 1 0 0  PHQ - 2 Score 0 0 0 0 2 0 0  Altered sleeping 0        Tired, decreased energy 0        Change in appetite 0        Feeling bad or failure about yourself  0        Trouble concentrating 0        Moving slowly or fidgety/restless 0        Suicidal thoughts 0        PHQ-9 Score 0          Review of Systems     Objective:   Physical Exam        Assessment & Plan:

## 2023-11-07 ENCOUNTER — Encounter: Admitting: Registered Nurse

## 2023-11-16 ENCOUNTER — Other Ambulatory Visit: Payer: Self-pay | Admitting: Registered Nurse

## 2023-11-18 ENCOUNTER — Encounter: Attending: Registered Nurse | Admitting: Registered Nurse

## 2023-11-18 VITALS — BP 91/64 | HR 78 | Ht 67.0 in | Wt 262.0 lb

## 2023-11-18 DIAGNOSIS — M542 Cervicalgia: Secondary | ICD-10-CM | POA: Diagnosis present

## 2023-11-18 DIAGNOSIS — M1712 Unilateral primary osteoarthritis, left knee: Secondary | ICD-10-CM | POA: Diagnosis present

## 2023-11-18 DIAGNOSIS — M5412 Radiculopathy, cervical region: Secondary | ICD-10-CM | POA: Diagnosis present

## 2023-11-18 DIAGNOSIS — Z79891 Long term (current) use of opiate analgesic: Secondary | ICD-10-CM | POA: Insufficient documentation

## 2023-11-18 DIAGNOSIS — M792 Neuralgia and neuritis, unspecified: Secondary | ICD-10-CM | POA: Insufficient documentation

## 2023-11-18 DIAGNOSIS — G90521 Complex regional pain syndrome I of right lower limb: Secondary | ICD-10-CM | POA: Insufficient documentation

## 2023-11-18 DIAGNOSIS — G894 Chronic pain syndrome: Secondary | ICD-10-CM | POA: Diagnosis present

## 2023-11-18 DIAGNOSIS — G8929 Other chronic pain: Secondary | ICD-10-CM | POA: Insufficient documentation

## 2023-11-18 DIAGNOSIS — M1711 Unilateral primary osteoarthritis, right knee: Secondary | ICD-10-CM | POA: Diagnosis present

## 2023-11-18 DIAGNOSIS — Z5181 Encounter for therapeutic drug level monitoring: Secondary | ICD-10-CM | POA: Diagnosis present

## 2023-11-18 DIAGNOSIS — M25512 Pain in left shoulder: Secondary | ICD-10-CM | POA: Insufficient documentation

## 2023-11-18 MED ORDER — OXYCODONE HCL 10 MG PO TABS: 10.0000 mg | ORAL_TABLET | Freq: Every day | ORAL | 0 refills | Status: DC | PRN

## 2023-11-18 NOTE — Progress Notes (Signed)
 Subjective:    Patient ID: Sabrina Williams, female    DOB: [birth year], 53 y.o.   MRN: [id number]  HPI: Sabrina Williams is a 53 y.o. female who returns for follow up appointment for chronic pain and medication refill. She states her pain is located in her neck radiating into her left shoulder, left arm with tingling and burning. Also reports bilateral Williams pain and right foot pain..She rates her pain 4.Her  current exercise regime is walking and performing stretching exercises.  Sabrina Williams Morphine  equivalent is 75.00 MME.   UDS ordered today.    Pain Inventory Average Pain 5 Pain Right Now 4 My pain is constant, sharp, burning, dull, tingling, and aching  In the last 24 hours, has pain interfered with the following? General activity 6 Relation with others 3 Enjoyment of life 5 What TIME of day is your pain at its worst? evening Sleep (in general) Fair  Pain is worse with: walking, standing, and some activites Pain improves with: rest, heat/ice, therapy/exercise, and medication Relief from Meds: 7  Family History  Problem Relation Age of Onset   Cancer Mother        multiple myeloma   Diabetes Father    Kidney disease Father    Heart disease Father    Social History   Socioeconomic History   Marital status: Married    Spouse name: Not on file   Number of children: 2   Years of education: some college   Highest education level: Not on file  Occupational History   Occupation: homemaker  Tobacco Use   Smoking status: Never   Smokeless tobacco: Never  Vaping Use   Vaping status: Never Used  Substance and Sexual Activity   Alcohol  use: No    Alcohol /week: 0.0 standard drinks of alcohol    Drug use: No   Sexual activity: Not on file  Other Topics Concern   Not on file  Social History Narrative   Lives at home with husband.   Right-handed.   No daily caffeine use.   Social Drivers of Corporate investment banker Strain: Low Risk  (08/16/2023)   Received from  [facility]   Overall Financial Resource Strain (CARDIA)    Difficulty of Paying Living Expenses: Not hard at all  Food Insecurity: No Food Insecurity (08/16/2023)   Received from [facility]   Hunger Vital Sign    Within the past 12 months, you worried that your food would run out before you got the money to buy more.: Never true    Within the past 12 months, the food you bought just didn't last and you didn't have money to get more.: Never true  Transportation Needs: No Transportation Needs (08/16/2023)   Received from [facility] - Transportation    In the past 12 months, has lack of transportation kept you from medical appointments or from getting medications?: No    Lack of Transportation (Non-Medical): No  Physical Activity: Not on file  Stress: Not on file  Social Connections: Not on file   Past Surgical History:  Procedure Laterality Date   CERVICAL SPINE SURGERY  2005   C6-7 fusion   CESAREAN SECTION     x2   CYSTOSCOPY WITH RETROGRADE PYELOGRAM, URETEROSCOPY AND STENT PLACEMENT Left 06/21/2022   Procedure: CYSTOSCOPY WITH RETROGRADE PYELOGRAM, URETEROSCOPY AND STENT PLACEMENT;  Surgeon: Sabrina Knee, MD;  Location: [facility];  Service: Urology;  Laterality: Left;  CYSTOSCOPY/URETEROSCOPY/HOLMIUM LASER/STENT PLACEMENT Left 07/06/2022   Procedure: CYSTOSCOPY/URETEROSCOPY/HOLMIUM LASER/STENT EXCHANGE;  Surgeon: Sabrina Glendia BROCKS, MD;  Location: [facility];  Service: Urology;  Laterality: Left;   dental implant  11/2013   ENDOSCOPIC PLANTAR FASCIOTOMY Right 12/2013   HAND SURGERY Right    Williams ARTHROSCOPY  1985   LITHOTRIPSY     multiple   PARTIAL GASTRECTOMY  01/01/2016   PLANTAR FASCIECTOMY Right    SHOULDER SURGERY Left 10/2004   SMALL INTESTINE SURGERY     cervical laminectomy   utereroscopy  1995   Past Surgical History:  Procedure Laterality Date   CERVICAL SPINE SURGERY  2005   C6-7 fusion   CESAREAN  SECTION     x2   CYSTOSCOPY WITH RETROGRADE PYELOGRAM, URETEROSCOPY AND STENT PLACEMENT Left 06/21/2022   Procedure: CYSTOSCOPY WITH RETROGRADE PYELOGRAM, URETEROSCOPY AND STENT PLACEMENT;  Surgeon: Sabrina Knee, MD;  Location: [facility];  Service: Urology;  Laterality: Left;   CYSTOSCOPY/URETEROSCOPY/HOLMIUM LASER/STENT PLACEMENT Left 07/06/2022   Procedure: CYSTOSCOPY/URETEROSCOPY/HOLMIUM LASER/STENT EXCHANGE;  Surgeon: Sabrina Glendia BROCKS, MD;  Location: [facility];  Service: Urology;  Laterality: Left;   dental implant  11/2013   ENDOSCOPIC PLANTAR FASCIOTOMY Right 12/2013   HAND SURGERY Right    Williams ARTHROSCOPY  1985   LITHOTRIPSY     multiple   PARTIAL GASTRECTOMY  01/01/2016   PLANTAR FASCIECTOMY Right    SHOULDER SURGERY Left 10/2004   SMALL INTESTINE SURGERY     cervical laminectomy   utereroscopy  1995   Past Medical History:  Diagnosis Date   Alopecia    Anemia    Anxiety    Cervical radiculopathy at C6    Chronic kidney disease    stones   Chronic pain    Complex regional pain syndrome I    right foot   Depression    Diabetes mellitus type 2, uncomplicated (HCC)    GERD (gastroesophageal reflux disease)    Heart murmur    History of kidney stones    Hypercholesterolemia    Hypertension, essential, benign    Kidney stone    Morbid obesity (HCC)    Neuromuscular disorder (HCC)    Plantar fasciitis    Stage 4 chronic kidney disease (HCC)    Steatosis of liver    Tremor    Vitamin D deficiency    BP 91/64   Pulse 78   Ht 5' 7 (1.702 m)   Wt 262 lb (118.8 kg)   LMP 12/02/2019   SpO2 94%   BMI 41.04 kg/m   Opioid Risk Score:   Fall Risk Score:  `1  Depression screen PHQ 2/9     10/06/2023    1:56 PM 08/11/2023    2:57 PM 07/12/2023    2:21 PM 06/16/2023    2:30 PM 02/23/2023    8:47 AM 01/27/2023    9:23 AM 12/27/2022   10:53 AM  Depression screen PHQ 2/9  Decreased Interest 0 0 0 0 1 0 0  Down, Depressed, Hopeless 0 0 0 0 1 0 0  PHQ - 2 Score 0 0 0  0 2 0 0  Altered sleeping 0        Tired, decreased energy 0        Change in appetite 0        Feeling bad or failure about yourself  0        Trouble concentrating 0        Moving slowly or fidgety/restless 0  Suicidal thoughts 0        PHQ-9 Score 0            Review of Systems  Musculoskeletal:        Bilateral Williams pain Left arm pain Right foot pain  All other systems reviewed and are negative.      Objective:   Physical Exam Vitals and nursing note reviewed.  Constitutional:      Appearance: Normal appearance. She is obese.  Cardiovascular:     Rate and Rhythm: Normal rate and regular rhythm.     Pulses: Normal pulses.     Heart sounds: Normal heart sounds.  Pulmonary:     Effort: Pulmonary effort is normal.     Breath sounds: Normal breath sounds.  Musculoskeletal:     Comments: Normal Muscle Bulk and Muscle Testing Reveals:  Upper Extremities:Right: Full ROM and Muscle Strength 5/5 Left Upper Extremity: Decreased  ROM 90 Degrees  and Muscle Strength 5/5 Left AC Joint Tenderness  Lower Extremities: Full ROM and Muscle Strength 5/5 Arises from Table slowly Narrow Based  Gait     Skin:    General: Skin is warm and dry.  Neurological:     Mental Status: She is alert and oriented to person, place, and time.  Psychiatric:        Mood and Affect: Mood normal.        Behavior: Behavior normal.          Assessment & Plan:  1.Complex regional pain syndrome right lower extremity postoperative. She's s/p post plantar fascial release. She continue with sensitivity to touch. She has diffuse numbness and tingling.Continue Gabapentin  . . 11/18/2023. Nucynta  discontinued due to Urine Clearance. .We will continue the opioid monitoring program, this consists of regular clinic visits, examinations, urine drug screen, pill counts as well as use of [place]  Controlled Substance Reporting system. A 12 month History has been reviewed on the [place]   Controlled Substance Reporting System on 11/18/2023. 2.Myofascial pain syndrome: Continue current medication regime with Tizanidine , only at HS while prescribe Methocarbamol by Ortho, she verbalizes understanding. Continue with ice, heat and exercise regime. 11/18/2023 3. Depression: Continue: Zoloft. PCP Following. 11/18/2023 4. Cervical Post Laminectomy: Continue to Monitor. 11/18/2023 5. Cervicalgia/Cervical  Radiculopathy/ Left shoulder, Left Arm and Left  Hand Pain with tingling,  . Continue to monitor. 11/18/2023 6. Poly Neuropathy: Continue current medication regimen with Nortriptyline   and :Continue to Monitor. 11/18/2023 7. Left Shoulder Tendonitis: Chronic Left Shoulder Pain:  Ortho Following. Continue current medication regime and Continue  to Monitor. 11/18/2023 8. Right hand pain/ Post Surgical  Procedure: Trigger Finger Release: Dr. Theophilus Following.  No complaints Today. 11/18/2023. 9. Tremor: No complaints today. Continue to monitor.  11/18/2023 10. Chronic Bilateral  Williams Pain: Orthopedics Following: Continue with HEP as Tolerated. Continue to Monitor. 11/18/2023 11. Chronic Right  Foot Pain. Podiatry Following.Continue with HEP as tolerated. Continue current medication regimen. Continue to monitor. 11/18/2023  12. Left Greater Trochanter Bursitis: No complaints today. Continue to alternate Ice and Heat Therapy. Continue to Monitor. 11/18/2023  13. Right Lateral Epicondylitis:  No complaints today. Ortho Following. Continue to Monitor.  11/18/2023  14/ Fall subsequent encounter/ No falls: . Refilled : Oxycodone  10 mg one tablet 5 times a day as needed for pain. #150. We will continue with slow weaning, she verbalizes understanding. With a goal to resume Oxycodone  10 mg 4 times a day as needed for pain. We will continue the opioid monitoring program, this  consists of regular clinic visits, examinations, urine drug screen, pill counts as well as use of [place]  Controlled  Substance Reporting system. A 12 month History has been reviewed on the [place]  Controlled Substance Reporting System on 11/18/2023.  15. Left Elbow Pain: No complaints today. Ortho Following. Continue Physical Therapy. 11/18/2023 16. Neuropathic Pain: Continue Gabapentin  as prescribed. Continue To Monitor. 11/18/2023      F/U in 1 month

## 2023-11-23 LAB — TOXASSURE SELECT,+ANTIDEPR,UR

## 2023-11-24 ENCOUNTER — Ambulatory Visit: Admitting: Urology

## 2023-11-26 ENCOUNTER — Encounter: Payer: Self-pay | Admitting: Registered Nurse

## 2023-12-09 ENCOUNTER — Other Ambulatory Visit: Payer: Self-pay | Admitting: Registered Nurse

## 2023-12-22 ENCOUNTER — Ambulatory Visit
Admission: RE | Admit: 2023-12-22 | Discharge: 2023-12-22 | Disposition: A | Discharge status: Home / Self Care | Source: Ambulatory Visit | Attending: Urology | Admitting: Urology

## 2023-12-22 ENCOUNTER — Other Ambulatory Visit: Payer: Self-pay

## 2023-12-22 ENCOUNTER — Ambulatory Visit: Admitting: Urology

## 2023-12-22 VITALS — BP 110/75 | HR 92

## 2023-12-22 DIAGNOSIS — N200 Calculus of kidney: Secondary | ICD-10-CM | POA: Diagnosis present

## 2023-12-22 DIAGNOSIS — N3281 Overactive bladder: Secondary | ICD-10-CM

## 2023-12-22 DIAGNOSIS — N3941 Urge incontinence: Secondary | ICD-10-CM

## 2023-12-22 MED ORDER — GEMTESA 75 MG PO TABS: 75.0000 mg | ORAL_TABLET | Freq: Every day | ORAL | Status: AC

## 2023-12-22 NOTE — Progress Notes (Signed)
 12/22/2023 11:36 AM   Sabrina Williams [birth year] [id number]  Referring provider: [name], NP [address] [place],  [place] [address]  Chief Complaint  Patient presents with   Follow-up   Urologic history: 1.  Nephrolithiasis CT July 2023 with bilateral, non-obstructing renal calculi Left ureteroscopic stone removal 06/2022  HPI: Sabrina Williams is a 53 y.o. female presents for follow-up of recurrent nephrolithiasis.  Since her last visit she saw our PA in March 2025 for a UTI but has been asymptomatic since that time No flank, abdominal or pelvic pain She does have overactive bladder symptoms frequency urgency and urge incontinence.  She has been on oxybutynin  in the past No gross hematuria   PMH: Past Medical History:  Diagnosis Date   Alopecia    Anemia    Anxiety    Cervical radiculopathy at C6    Chronic kidney disease    stones   Chronic pain    Complex regional pain syndrome I    right foot   Depression    Diabetes mellitus type 2, uncomplicated (HCC)    GERD (gastroesophageal reflux disease)    Heart murmur    History of kidney stones    Hypercholesterolemia    Hypertension, essential, benign    Kidney stone    Morbid obesity (HCC)    Neuromuscular disorder (HCC)    Plantar fasciitis    Stage 4 chronic kidney disease (HCC)    Steatosis of liver    Tremor    Vitamin D deficiency     Surgical History: Past Surgical History:  Procedure Laterality Date   CERVICAL SPINE SURGERY  2005   C6-7 fusion   CESAREAN SECTION     x2   CYSTOSCOPY WITH RETROGRADE PYELOGRAM, URETEROSCOPY AND STENT PLACEMENT Left 06/21/2022   Procedure: CYSTOSCOPY WITH RETROGRADE PYELOGRAM, URETEROSCOPY AND STENT PLACEMENT;  Surgeon: Sabrina Knee, MD;  Location: [facility];  Service: Urology;  Laterality: Left;   CYSTOSCOPY/URETEROSCOPY/HOLMIUM LASER/STENT PLACEMENT Left 07/06/2022   Procedure: CYSTOSCOPY/URETEROSCOPY/HOLMIUM LASER/STENT EXCHANGE;  Surgeon:  Sabrina Glendia BROCKS, MD;  Location: [facility];  Service: Urology;  Laterality: Left;   dental implant  11/2013   ENDOSCOPIC PLANTAR FASCIOTOMY Right 12/2013   HAND SURGERY Right    Williams ARTHROSCOPY  1985   LITHOTRIPSY     multiple   PARTIAL GASTRECTOMY  01/01/2016   PLANTAR FASCIECTOMY Right    SHOULDER SURGERY Left 10/2004   SMALL INTESTINE SURGERY     cervical laminectomy   utereroscopy  1995    Home Medications:  Allergies as of 12/22/2023       Reactions   Penicillins Hives, Rash   Nsaids    Other Reaction(s): Not available        Medication List        Accurate as of December 22, 2023 11:36 AM. If you have any questions, ask your nurse or doctor.          acetaminophen  500 MG tablet Commonly known as: TYLENOL  Take by mouth.   amLODipine 2.5 MG tablet Commonly known as: NORVASC Take 2.5 mg by mouth 2 (two) times daily.   Bariatric Multivitamins/Iron Caps Take 3 capsules by mouth daily before supper.   benazepril 20 MG tablet Commonly known as: LOTENSIN Take 20 mg by mouth daily. 1/2 a pill   calcitRIOL  0.5 MCG capsule Commonly known as: ROCALTROL  Take 0.5 mcg by mouth in the morning and at bedtime.   calcium  carbonate 1250 MG capsule Take  1,250 mg by mouth daily.   DULoxetine  60 MG capsule Commonly known as: CYMBALTA  Take 60 mg by mouth daily.   estradiol  0.1 MG/GM vaginal cream Commonly known as: ESTRACE  Apply one pea-sized amount around the opening of the urethra daily for 2 weeks, then 3 times weekly moving forward.   ferrous sulfate  325 (65 FE) MG tablet Take 325 mg by mouth daily with breakfast.   Fingerstix Lancets Misc Use 1 each 2 (two) times daily As directed [Test blood sugars in rotating pattern:  fasting, before meals, 2 hours after a meal, at bedtime]   gabapentin  100 MG capsule Commonly known as: NEURONTIN  TAKE 1 CAPSULE BY MOUTH EVERY DAY   glucose blood test strip USE 1 STRIP TWICE A DAY AS DIRECTED   Lagevrio 200 MG Caps  capsule Generic drug: molnupiravir EUA TAKE 4 CAPSULES (800 MG TOTAL) BY MOUTH TWICE A DAY FOR 5 DAYS   lovastatin 20 MG tablet Commonly known as: MEVACOR Take 20 mg by mouth every evening.   magnesium oxide 400 (240 Mg) MG tablet Commonly known as: MAG-OX Take 1 tablet by mouth daily.   methocarbamol 500 MG tablet Commonly known as: ROBAXIN Take 500 mg by mouth 3 (three) times daily.   methylPREDNISolone  4 MG Tbpk tablet Commonly known as: MEDROL  DOSEPAK SMARTSIG:- Tablet(s) By Mouth -   nortriptyline  75 MG capsule Commonly known as: PAMELOR  TAKE 1 CAPSULE BY MOUTH AT BEDTIME.   omeprazole 10 MG capsule Commonly known as: PRILOSEC Take 1 tablet by mouth daily.   oxybutynin  10 MG 24 hr tablet Commonly known as: DITROPAN -XL TAKE 1 TABLET BY MOUTH EVERYDAY AT BEDTIME   Oxycodone  HCl 10 MG Tabs Take 1 tablet (10 mg total) by mouth 5 (five) times daily as needed.   patiromer 8.4 g packet Commonly known as: VELTASSA Take by mouth.   tamsulosin  0.4 MG Caps capsule Commonly known as: FLOMAX  Take 1 capsule (0.4 mg total) by mouth daily.   tiZANidine  4 MG tablet Commonly known as: ZANAFLEX  TAKE 1 TABLET (4 MG TOTAL) BY MOUTH 5 (FIVE) TIMES DAILY AS NEEDED FOR MUSCLE SPASMS.   torsemide 5 MG tablet Commonly known as: DEMADEX Take 5 mg by mouth 2 (two) times daily.   VITAMIN C (CALCIUM  ASCORBATE) PO   Vitamin D (Ergocalciferol) 1.25 MG (50000 UNIT) Caps capsule Commonly known as: DRISDOL TAKE 1 CAPSULE (50,000 UNITS TOTAL) BY MOUTH TWICE A WEEK FOR 30 DAYS   VITAMIN D3 PO Take 2,000 Units by mouth daily.        Allergies:  Allergies  Allergen Reactions   Penicillins Hives and Rash   Nsaids     Other Reaction(s): Not available    Family History: Family History  Problem Relation Age of Onset   Cancer Mother        multiple myeloma   Diabetes Father    Kidney disease Father    Heart disease Father     Social History:  reports that she has never  smoked. She has never used smokeless tobacco. She reports that she does not drink alcohol  and does not use drugs.   Physical Exam: BP 110/75   Pulse 92   LMP 12/02/2019   Constitutional:  Alert and oriented, No acute distress. HEENT: [place] AT Respiratory: Normal respiratory effort, no increased work of breathing. Psychiatric: Normal mood and affect.   Pertinent Imaging: KUB performed earlier today was personally reviewed and interpreted. There is a 5mm calcification overlying the inferior portion of the right  renal outline.  Stable from previous KUB of 2024   Assessment & Plan:    1. Recurrent nephrolithiasis Nonobstructing right lower pole calculus  2.  Overactive bladder She was interested in further medical management and will give a trial of Gemtesa  75 mg daily-samples given She will contact the office by phone or MyChart regarding efficacy    Glendia JAYSON Barba, MD  [facility] [address], [address] [place], [place] [address] [phone]

## 2023-12-27 ENCOUNTER — Encounter: Payer: Self-pay | Admitting: Urology

## 2023-12-27 ENCOUNTER — Encounter: Payer: Self-pay | Admitting: Registered Nurse

## 2023-12-27 ENCOUNTER — Encounter: Attending: Registered Nurse | Admitting: Registered Nurse

## 2023-12-27 VITALS — BP 116/79 | HR 93 | Ht 67.0 in | Wt 257.6 lb

## 2023-12-27 DIAGNOSIS — M1711 Unilateral primary osteoarthritis, right knee: Secondary | ICD-10-CM | POA: Insufficient documentation

## 2023-12-27 DIAGNOSIS — Z5181 Encounter for therapeutic drug level monitoring: Secondary | ICD-10-CM | POA: Diagnosis present

## 2023-12-27 DIAGNOSIS — G90521 Complex regional pain syndrome I of right lower limb: Secondary | ICD-10-CM | POA: Insufficient documentation

## 2023-12-27 DIAGNOSIS — M1712 Unilateral primary osteoarthritis, left knee: Secondary | ICD-10-CM | POA: Diagnosis present

## 2023-12-27 DIAGNOSIS — Z79891 Long term (current) use of opiate analgesic: Secondary | ICD-10-CM | POA: Insufficient documentation

## 2023-12-27 DIAGNOSIS — G8929 Other chronic pain: Secondary | ICD-10-CM | POA: Insufficient documentation

## 2023-12-27 DIAGNOSIS — M792 Neuralgia and neuritis, unspecified: Secondary | ICD-10-CM | POA: Diagnosis present

## 2023-12-27 DIAGNOSIS — M5412 Radiculopathy, cervical region: Secondary | ICD-10-CM | POA: Diagnosis present

## 2023-12-27 DIAGNOSIS — G894 Chronic pain syndrome: Secondary | ICD-10-CM | POA: Insufficient documentation

## 2023-12-27 DIAGNOSIS — M542 Cervicalgia: Secondary | ICD-10-CM | POA: Diagnosis not present

## 2023-12-27 DIAGNOSIS — M25512 Pain in left shoulder: Secondary | ICD-10-CM | POA: Diagnosis not present

## 2023-12-27 MED ORDER — OXYCODONE HCL 10 MG PO TABS: 10.0000 mg | ORAL_TABLET | Freq: Every day | ORAL | 0 refills | Status: DC | PRN

## 2023-12-27 NOTE — Patient Instructions (Signed)
 Begin slow weaning of Gabapentin , update this provider in two weeks with medication change.

## 2023-12-27 NOTE — Progress Notes (Signed)
 Subjective:    Patient ID: Sabrina Williams, female    DOB: Nov 16, 1970, 53 y.o.   MRN: [id number]  HPI: Sabrina Williams is a 53 y.o. female who returns for follow up appointment for chronic pain and medication refill. She states her pain is located in her neck radiating into her left shoulder and left arm with tingling, she also reports bilateral knee pain and right foot pain. She rates her pain 3. Her current exercise regime is walking and performing stretching exercises.  Sabrina Williams  equivalent is 75.00 MME.   Last UDS was Performed on 11/18/2023, it was consistent.    Pain Inventory Average Pain 5 Pain Right Now 3 My pain is constant, sharp, burning, dull, stabbing, and tingling  In the last 24 hours, has pain interfered with the following? General activity 7 Relation with others 5 Enjoyment of life 7 What TIME of day is your pain at its worst? evening Sleep (in general) Fair  Pain is worse with: walking, bending, standing, and some activites Pain improves with: rest, heat/ice, therapy/exercise, and medication Relief from Meds: 7  Family History  Problem Relation Age of Onset   Cancer Mother        multiple myeloma   Diabetes Father    Kidney disease Father    Heart disease Father    Social History   Socioeconomic History   Marital status: Married    Spouse name: Not on file   Number of children: 2   Years of education: some college   Highest education level: Not on file  Occupational History   Occupation: homemaker  Tobacco Use   Smoking status: Never   Smokeless tobacco: Never  Vaping Use   Vaping status: Never Used  Substance and Sexual Activity   Alcohol  use: No    Alcohol /week: 0.0 standard drinks of alcohol    Drug use: No   Sexual activity: Not on file  Other Topics Concern   Not on file  Social History Narrative   Lives at home with husband.   Right-handed.   No daily caffeine use.   Social Drivers of Corporate investment banker  Strain: Low Risk  (08/16/2023)   Received from [facility]   Overall Financial Resource Strain (CARDIA)    Difficulty of Paying Living Expenses: Not hard at all  Food Insecurity: No Food Insecurity (08/16/2023)   Received from [facility]   Hunger Vital Sign    Within the past 12 months, you worried that your food would run out before you got the money to buy more.: Never true    Within the past 12 months, the food you bought just didn't last and you didn't have money to get more.: Never true  Transportation Needs: No Transportation Needs (08/16/2023)   Received from [facility] - Transportation    In the past 12 months, has lack of transportation kept you from medical appointments or from getting medications?: No    Lack of Transportation (Non-Medical): No  Physical Activity: Not on file  Stress: Not on file  Social Connections: Not on file   Past Surgical History:  Procedure Laterality Date   CERVICAL SPINE SURGERY  2005   C6-7 fusion   CESAREAN SECTION     x2   CYSTOSCOPY WITH RETROGRADE PYELOGRAM, URETEROSCOPY AND STENT PLACEMENT Left 06/21/2022   Procedure: CYSTOSCOPY WITH RETROGRADE PYELOGRAM, URETEROSCOPY AND STENT PLACEMENT;  Surgeon: Penne Knee, MD;  Location:  [facility] ORS;  Service: Urology;  Laterality: Left;   CYSTOSCOPY/URETEROSCOPY/HOLMIUM LASER/STENT PLACEMENT Left 07/06/2022   Procedure: CYSTOSCOPY/URETEROSCOPY/HOLMIUM LASER/STENT EXCHANGE;  Surgeon: Twylla Glendia BROCKS, MD;  Location: [facility] ORS;  Service: Urology;  Laterality: Left;   dental implant  11/2013   ENDOSCOPIC PLANTAR FASCIOTOMY Right 12/2013   HAND SURGERY Right    KNEE ARTHROSCOPY  1985   LITHOTRIPSY     multiple   PARTIAL GASTRECTOMY  01/01/2016   PLANTAR FASCIECTOMY Right    SHOULDER SURGERY Left 10/2004   SMALL INTESTINE SURGERY     cervical laminectomy   utereroscopy  1995   Past Surgical History:  Procedure Laterality Date   CERVICAL  SPINE SURGERY  2005   C6-7 fusion   CESAREAN SECTION     x2   CYSTOSCOPY WITH RETROGRADE PYELOGRAM, URETEROSCOPY AND STENT PLACEMENT Left 06/21/2022   Procedure: CYSTOSCOPY WITH RETROGRADE PYELOGRAM, URETEROSCOPY AND STENT PLACEMENT;  Surgeon: Penne Knee, MD;  Location: [facility] OR[facility];  Service: Urology;  Laterality: Left;   CYSTOSCOPY/URETEROSCOPY/HOLMIUM LASER/STENT PLACEMENT Left 07/06/2022   Procedure: CYSTOSCOPY/URETEROSCOPY/HOLMIUM LASER/STENT EXCHANGE;  Surgeon: Twylla Glendia BROCKS, MD;  Location: [facility] OR[facility];  Service: Urology;  Laterality: Left;   dental implant  11/2013   ENDOSCOPIC PLANTAR FASCIOTOMY Right 12/2013   HAND SURGERY Right    KNEE ARTHROSCOPY  1985   LITHOTRIPSY     multiple   PARTIAL GASTRECTOMY  01/01/2016   PLANTAR FASCIECTOMY Right    SHOULDER SURGERY Left 10/2004   SMALL INTESTINE SURGERY     cervical laminectomy   utereroscopy  1995   Past Medical History:  Diagnosis Date   Alopecia    Anemia    Anxiety    Cervical radiculopathy at C6    Chronic kidney disease    stones   Chronic pain    Complex regional pain syndrome I    right foot   Depression    Diabetes mellitus type 2, uncomplicated (HCC)    GERD (gastroesophageal reflux disease)    Heart murmur    History of kidney stones    Hypercholesterolemia    Hypertension, essential, benign    Kidney stone    Morbid obesity (HCC)    Neuromuscular disorder (HCC)    Plantar fasciitis    Stage 4 chronic kidney disease (HCC)    Steatosis of liver    Tremor    Vitamin D deficiency    BP 116/79 (BP Location: Left Arm, Patient Position: Sitting, Cuff Size: Large)   Pulse 93   Ht 5' 7 (1.702 m)   Wt 257 lb 9.6 oz (116.8 kg)   LMP 12/02/2019   SpO2 95%   BMI 40.35 kg/m   Opioid Risk Score:   Fall Risk Score:  `1  Depression screen [facility] 2/9     12/27/2023   10:24 AM 10/06/2023    1:56 PM 08/11/2023    2:57 PM 07/12/2023    2:21 PM 06/16/2023    2:30 PM 02/23/2023    8:47 AM 01/27/2023    9:23 AM   Depression screen PHQ 2/9  Decreased Interest 0 0 0 0 0 1 0  Down, Depressed, Hopeless 0 0 0 0 0 1 0  PHQ - 2 Score 0 0 0 0 0 2 0  Altered sleeping 0 0       Tired, decreased energy 0 0       Change in appetite 0 0       Feeling bad or failure about yourself  0 0  Trouble concentrating 0 0       Moving slowly or fidgety/restless 0 0       Suicidal thoughts 0 0       PHQ-9 Score 0 0           Review of Systems  Musculoskeletal:  Positive for arthralgias, back pain, myalgias and neck pain.       Left sided neck, shoulder, arm knee and bilateral foot pain  All other systems reviewed and are negative.      Objective:   Physical Exam Vitals and nursing note reviewed.  Constitutional:      Appearance: Normal appearance.  Neck:     Comments: Cervical Paraspinal Tenderness: C-5-C-6  Cardiovascular:     Rate and Rhythm: Normal rate and regular rhythm.  Pulmonary:     Effort: Pulmonary effort is normal.     Breath sounds: Normal breath sounds.  Musculoskeletal:     Comments: Normal Muscle Bulk and Muscle Testing Reveals:  Upper Extremities: Full ROM and Muscle Strength 5/5 Left AC Joint Tenderness  Thoracic Paraspinal Tenderness: T-4-T-6 Mainly Left Side  Lower Extremities: Full ROM and Muscle Strength 5/5 Arises from Table with ease Narrow Based  Gait     Skin:    General: Skin is warm and dry.  Neurological:     Mental Status: She is alert and oriented to person, place, and time.  Psychiatric:        Mood and Affect: Mood normal.        Behavior: Behavior normal.          Assessment & Plan:  1.Complex regional pain syndrome right lower extremity postoperative. She's s/p post plantar fascial release. She continue with sensitivity to touch. She has diffuse numbness and tingling.Continue Gabapentin  . . 12/27/2023. Nucynta  discontinued due to Urine Clearance. .We will continue the opioid monitoring program, this consists of regular clinic visits, examinations,  urine drug screen, pill counts as well as use of [place]  Controlled Substance Reporting system. A 12 month History has been reviewed on the [place]  Controlled Substance Reporting System on 12/27/2023. 2.Myofascial pain syndrome: Continue current medication regime with Tizanidine , only at HS while prescribe Methocarbamol by Ortho, she verbalizes understanding. Continue with ice, heat and exercise regime. 12/27/2023 3. Depression: Continue: Zoloft. PCP Following. 12/27/2023 4. Cervical Post Laminectomy: Continue to Monitor. 12/27/2023 5. Cervicalgia/Cervical  Radiculopathy/ Left shoulder, Left Arm and Left  Hand Pain with tingling,  . Continue to monitor. 12/27/2023 6. Poly Neuropathy: Continue current medication regimen with Nortriptyline   and :Continue to Monitor. 12/27/2023 7. Left Shoulder Tendonitis: Chronic Left Shoulder Pain:  Ortho Following. Continue current medication regime and Continue  to Monitor. 12/27/2023 8. Right hand pain/ Post Surgical  Procedure: Trigger Finger Release: Dr. Theophilus Following.  No complaints Today. 12/27/2023. 9. Tremor: No complaints today. Continue to monitor.  12/27/2023 10. Chronic Bilateral  Knee Pain: Orthopedics Following: Continue with HEP as Tolerated. Continue to Monitor. 12/27/2023 11. Chronic Right  Foot Pain. Podiatry Following.Continue with HEP as tolerated. Continue current medication regimen. Continue to monitor. 12/27/2023  12. Left Greater Trochanter Bursitis: No complaints today. Continue to alternate Ice and Heat Therapy. Continue to Monitor. 12/27/2023  13. Right Lateral Epicondylitis:  No complaints today. Ortho Following. Continue to Monitor.  12/27/2023  14/ Fall subsequent encounter/ No falls: . Refilled : Oxycodone  10 mg one tablet 5 times a day as needed for pain. #150. We will continue with slow weaning, she verbalizes understanding. With a goal to  resume Oxycodone  10 mg 4 times a day as needed for pain. We will continue the  opioid monitoring program, this consists of regular clinic visits, examinations, urine drug screen, pill counts as well as use of [place]  Controlled Substance Reporting system. A 12 month History has been reviewed on the [place]  Controlled Substance Reporting System on 12/27/2023.  15. Left Elbow Pain: No complaints today. Ortho Following. Continue Physical Therapy. 12/27/2023 16. Neuropathic Pain: Continue Gabapentin  as prescribed. Continue To Monitor. 12/27/2023      F/U in 1 month

## 2024-01-08 ENCOUNTER — Other Ambulatory Visit: Payer: Self-pay | Admitting: Registered Nurse

## 2024-01-26 ENCOUNTER — Encounter: Payer: Self-pay | Admitting: Registered Nurse

## 2024-01-26 ENCOUNTER — Encounter: Attending: Registered Nurse | Admitting: Registered Nurse

## 2024-01-26 VITALS — BP 105/72 | HR 98 | Ht 67.0 in | Wt 247.6 lb

## 2024-01-26 DIAGNOSIS — M1712 Unilateral primary osteoarthritis, left knee: Secondary | ICD-10-CM | POA: Insufficient documentation

## 2024-01-26 DIAGNOSIS — M25512 Pain in left shoulder: Secondary | ICD-10-CM | POA: Insufficient documentation

## 2024-01-26 DIAGNOSIS — Z79891 Long term (current) use of opiate analgesic: Secondary | ICD-10-CM | POA: Diagnosis present

## 2024-01-26 DIAGNOSIS — G894 Chronic pain syndrome: Secondary | ICD-10-CM | POA: Insufficient documentation

## 2024-01-26 DIAGNOSIS — G8929 Other chronic pain: Secondary | ICD-10-CM | POA: Insufficient documentation

## 2024-01-26 DIAGNOSIS — Z5181 Encounter for therapeutic drug level monitoring: Secondary | ICD-10-CM | POA: Diagnosis present

## 2024-01-26 DIAGNOSIS — M1711 Unilateral primary osteoarthritis, right knee: Secondary | ICD-10-CM | POA: Insufficient documentation

## 2024-01-26 DIAGNOSIS — M5412 Radiculopathy, cervical region: Secondary | ICD-10-CM | POA: Insufficient documentation

## 2024-01-26 DIAGNOSIS — M792 Neuralgia and neuritis, unspecified: Secondary | ICD-10-CM | POA: Diagnosis present

## 2024-01-26 DIAGNOSIS — G90521 Complex regional pain syndrome I of right lower limb: Secondary | ICD-10-CM | POA: Diagnosis present

## 2024-01-26 DIAGNOSIS — M542 Cervicalgia: Secondary | ICD-10-CM | POA: Diagnosis not present

## 2024-01-26 MED ORDER — OXYCODONE HCL 10 MG PO TABS: 10.0000 mg | ORAL_TABLET | Freq: Every day | ORAL | 0 refills | Status: DC | PRN

## 2024-01-26 NOTE — Progress Notes (Signed)
 Subjective:    Patient ID: Sabrina Williams, female    DOB: [birth year], 53 y.o.   MRN: [id number]  HPI: Sabrina Williams is a 53 y.o. female who returns for follow up appointment for chronic pain and medication refill. She states her pain is located in her neck radiating into her left shoulder, left arm with tingling, bilateral knee pain and right foot pain. She rates her pain 2. Her current exercise regime is walking and performing stretching exercises.  Sabrina Williams Morphine  equivalent is 75.00 MME.   Last UDS was performed on 11/18/2023, it was consistent.     Pain Inventory Average Pain 4 Pain Right Now 2 My pain is constant, sharp, burning, stabbing, and aching  In the last 24 hours, has pain interfered with the following? General activity 6 Relation with others 6 Enjoyment of life 7 What TIME of day is your pain at its worst? evening Sleep (in general) Poor  Pain is worse with: walking, standing, and some activites Pain improves with: rest, heat/ice, therapy/exercise, and medication Relief from Meds: 7  Family History  Problem Relation Age of Onset   Cancer Mother        multiple myeloma   Diabetes Father    Kidney disease Father    Heart disease Father    Social History   Socioeconomic History   Marital status: Married    Spouse name: Not on file   Number of children: 2   Years of education: some college   Highest education level: Not on file  Occupational History   Occupation: homemaker  Tobacco Use   Smoking status: Never   Smokeless tobacco: Never  Vaping Use   Vaping status: Never Used  Substance and Sexual Activity   Alcohol  use: No    Alcohol /week: 0.0 standard drinks of alcohol    Drug use: No   Sexual activity: Not on file  Other Topics Concern   Not on file  Social History Narrative   Lives at home with husband.   Right-handed.   No daily caffeine use.   Social Drivers of Corporate investment banker Strain: Low Risk  (08/16/2023)    Received from [facility]   Overall Financial Resource Strain (CARDIA)    Difficulty of Paying Living Expenses: Not hard at all  Food Insecurity: No Food Insecurity (08/16/2023)   Received from [facility]   Hunger Vital Sign    Within the past 12 months, you worried that your food would run out before you got the money to buy more.: Never true    Within the past 12 months, the food you bought just didn't last and you didn't have money to get more.: Never true  Transportation Needs: No Transportation Needs (08/16/2023)   Received from [facility] - Transportation    In the past 12 months, has lack of transportation kept you from medical appointments or from getting medications?: No    Lack of Transportation (Non-Medical): No  Physical Activity: Not on file  Stress: Not on file  Social Connections: Not on file   Past Surgical History:  Procedure Laterality Date   CERVICAL SPINE SURGERY  2005   C6-7 fusion   CESAREAN SECTION     x2   CYSTOSCOPY WITH RETROGRADE PYELOGRAM, URETEROSCOPY AND STENT PLACEMENT Left 06/21/2022   Procedure: CYSTOSCOPY WITH RETROGRADE PYELOGRAM, URETEROSCOPY AND STENT PLACEMENT;  Surgeon: Penne Knee, MD;  Location: [facility];  Service: Urology;  Laterality: Left;   CYSTOSCOPY/URETEROSCOPY/HOLMIUM LASER/STENT PLACEMENT Left 07/06/2022   Procedure: CYSTOSCOPY/URETEROSCOPY/HOLMIUM LASER/STENT EXCHANGE;  Surgeon: Twylla Glendia BROCKS, MD;  Location: [facility];  Service: Urology;  Laterality: Left;   dental implant  11/2013   ENDOSCOPIC PLANTAR FASCIOTOMY Right 12/2013   HAND SURGERY Right    KNEE ARTHROSCOPY  1985   LITHOTRIPSY     multiple   PARTIAL GASTRECTOMY  01/01/2016   PLANTAR FASCIECTOMY Right    SHOULDER SURGERY Left 10/2004   SMALL INTESTINE SURGERY     cervical laminectomy   utereroscopy  1995   Past Surgical History:  Procedure Laterality Date   CERVICAL SPINE SURGERY  2005   C6-7  fusion   CESAREAN SECTION     x2   CYSTOSCOPY WITH RETROGRADE PYELOGRAM, URETEROSCOPY AND STENT PLACEMENT Left 06/21/2022   Procedure: CYSTOSCOPY WITH RETROGRADE PYELOGRAM, URETEROSCOPY AND STENT PLACEMENT;  Surgeon: Penne Knee, MD;  Location: [facility] OR[facility];  Service: Urology;  Laterality: Left;   CYSTOSCOPY/URETEROSCOPY/HOLMIUM LASER/STENT PLACEMENT Left 07/06/2022   Procedure: CYSTOSCOPY/URETEROSCOPY/HOLMIUM LASER/STENT EXCHANGE;  Surgeon: Twylla Glendia BROCKS, MD;  Location: [facility] OR[facility];  Service: Urology;  Laterality: Left;   dental implant  11/2013   ENDOSCOPIC PLANTAR FASCIOTOMY Right 12/2013   HAND SURGERY Right    KNEE ARTHROSCOPY  1985   LITHOTRIPSY     multiple   PARTIAL GASTRECTOMY  01/01/2016   PLANTAR FASCIECTOMY Right    SHOULDER SURGERY Left 10/2004   SMALL INTESTINE SURGERY     cervical laminectomy   utereroscopy  1995   Past Medical History:  Diagnosis Date   Alopecia    Anemia    Anxiety    Cervical radiculopathy at C6    Chronic kidney disease    stones   Chronic pain    Complex regional pain syndrome I    right foot   Depression    Diabetes mellitus type 2, uncomplicated (HCC)    GERD (gastroesophageal reflux disease)    Heart murmur    History of kidney stones    Hypercholesterolemia    Hypertension, essential, benign    Kidney stone    Morbid obesity (HCC)    Neuromuscular disorder (HCC)    Plantar fasciitis    Stage 4 chronic kidney disease (HCC)    Steatosis of liver    Tremor    Vitamin D deficiency    LMP 12/02/2019   Opioid Risk Score:   Fall Risk Score:  `1  Depression screen [facility] 2/9     12/27/2023   10:24 AM 10/06/2023    1:56 PM 08/11/2023    2:57 PM 07/12/2023    2:21 PM 06/16/2023    2:30 PM 02/23/2023    8:47 AM 01/27/2023    9:23 AM  Depression screen PHQ 2/9  Decreased Interest 0 0 0 0 0 1 0  Down, Depressed, Hopeless 0 0 0 0 0 1 0  PHQ - 2 Score 0 0 0 0 0 2 0  Altered sleeping 0 0       Tired, decreased energy 0 0       Change in  appetite 0 0       Feeling bad or failure about yourself  0 0       Trouble concentrating 0 0       Moving slowly or fidgety/restless 0 0       Suicidal thoughts 0 0       PHQ-9 Score 0 0  Review of Systems  Musculoskeletal:        Left arm bilateral knees and right foot  All other systems reviewed and are negative.      Objective:   Physical Exam Vitals and nursing note reviewed.  Constitutional:      Appearance: Normal appearance.  Neck:     Comments: Cervical Paraspinal Tenderness: C-5-C-6 Cardiovascular:     Rate and Rhythm: Normal rate and regular rhythm.     Pulses: Normal pulses.     Heart sounds: Normal heart sounds.  Pulmonary:     Effort: Pulmonary effort is normal.     Breath sounds: Normal breath sounds.  Musculoskeletal:     Comments: Normal Muscle Bulk and Muscle Testing Reveals:  Upper Extremities: Right: Full ROM and Muscle Strength 5/5 Left Upper Extremity: Decreased ROM: 90 Degrees and Muscle Strength 5/5  Thoracic Paraspinal Tenderness: T-4-T-6 : Mainly Left Side   Lower Extremities: Full ROM and Muscle Strength 5/5 Arises from Table Slowly  Narrow Based  Gait     Skin:    General: Skin is warm and dry.  Neurological:     Mental Status: She is alert and oriented to person, place, and time.  Psychiatric:        Mood and Affect: Mood normal.        Behavior: Behavior normal.          Assessment & Plan:  1.Complex regional pain syndrome right lower extremity postoperative. She's s/p post plantar fascial release. She continue with sensitivity to touch. She has diffuse numbness and tingling.Continue Gabapentin  . . 01/26/2024. Nucynta  discontinued due to Urine Clearance. .We will continue the opioid monitoring program, this consists of regular clinic visits, examinations, urine drug screen, pill counts as well as use of [place]  Controlled Substance Reporting system. A 12 month History has been reviewed on the [place]  Controlled  Substance Reporting System on 01/26/2024. 2.Myofascial pain syndrome: Continue current medication regime with Tizanidine , only at HS while prescribe Methocarbamol by Ortho, she verbalizes understanding. Continue with ice, heat and exercise regime. 01/26/2024 3. Depression: Continue: Zoloft. PCP Following. 01/26/2024 4. Cervical Post Laminectomy: Continue to Monitor. 01/26/2024 5. Cervicalgia/Cervical  Radiculopathy/ Left shoulder, Left Arm and Left  Hand Pain with tingling,  . Continue to monitor. 01/26/2024 6. Poly Neuropathy: Continue current medication regimen with Nortriptyline   and :Continue to Monitor. 01/26/2024 7. Left Shoulder Tendonitis: Chronic Left Shoulder Pain:  Ortho Following. Continue current medication regime and Continue  to Monitor. 01/26/2024 8. Right hand pain/ Post Surgical  Procedure: Trigger Finger Release: Dr. Theophilus Following.  No complaints Today. 01/26/2024. 9. Tremor: No complaints today. Continue to monitor.  01/26/2024 10. Chronic Bilateral  Knee Pain: Orthopedics Following: Continue with HEP as Tolerated. Continue to Monitor. 01/26/2024 11. Chronic Right  Foot Pain. Podiatry Following.Continue with HEP as tolerated. Continue current medication regimen. Continue to monitor. 01/26/2024  12. Left Greater Trochanter Bursitis: No complaints today. Continue to alternate Ice and Heat Therapy. Continue to Monitor. 01/26/2024  13. Right Lateral Epicondylitis:  No complaints today. Ortho Following. Continue to Monitor.  01/26/2024  14/ Fall subsequent encounter/ No falls: . Refilled : Oxycodone  10 mg one tablet 5 times a day as needed for pain. #150. We will continue with slow weaning, she verbalizes understanding. With a goal to resume Oxycodone  10 mg 4 times a day as needed for pain. We will continue the opioid monitoring program, this consists of regular clinic visits, examinations, urine drug screen, pill counts as well  as use of [place]  Controlled Substance  Reporting system. A 12 month History has been reviewed on the [place]  Controlled Substance Reporting System on 01/26/2024.  15. Left Elbow Pain: No complaints today. Ortho Following. Continue Physical Therapy. 01/26/2024 16. Neuropathic Pain: Continue Gabapentin  as prescribed. Continue To Monitor. 01/26/2024      F/U in 1 month

## 2024-01-30 ENCOUNTER — Inpatient Hospital Stay: Attending: Oncology | Admitting: Oncology

## 2024-01-30 ENCOUNTER — Inpatient Hospital Stay

## 2024-01-30 ENCOUNTER — Encounter: Payer: Self-pay | Admitting: Oncology

## 2024-01-30 VITALS — BP 115/80 | HR 87 | Temp 97.3°F | Resp 18 | Ht 67.0 in | Wt 252.3 lb

## 2024-01-30 DIAGNOSIS — Z79620 Long term (current) use of immunosuppressive biologic: Secondary | ICD-10-CM | POA: Insufficient documentation

## 2024-01-30 DIAGNOSIS — Z79899 Other long term (current) drug therapy: Secondary | ICD-10-CM | POA: Insufficient documentation

## 2024-01-30 DIAGNOSIS — I129 Hypertensive chronic kidney disease with stage 1 through stage 4 chronic kidney disease, or unspecified chronic kidney disease: Secondary | ICD-10-CM | POA: Insufficient documentation

## 2024-01-30 DIAGNOSIS — N184 Chronic kidney disease, stage 4 (severe): Secondary | ICD-10-CM | POA: Diagnosis not present

## 2024-01-30 DIAGNOSIS — D631 Anemia in chronic kidney disease: Secondary | ICD-10-CM | POA: Insufficient documentation

## 2024-01-30 DIAGNOSIS — Z807 Family history of other malignant neoplasms of lymphoid, hematopoietic and related tissues: Secondary | ICD-10-CM | POA: Insufficient documentation

## 2024-01-30 LAB — FOLATE: Folate: 8.1 ng/mL (ref >5.9)

## 2024-01-30 LAB — FERRITIN: Ferritin: 80 ng/mL (ref 11–307)

## 2024-01-30 LAB — CBC WITH DIFFERENTIAL/PLATELET
Abs Immature Granulocytes: 0.04 K/uL (ref 0.00–0.07)
Basophils Absolute: 0.1 K/uL (ref 0.0–0.1)
Basophils Relative: 1 %
Eosinophils Absolute: 0.3 K/uL (ref 0.0–0.5)
Eosinophils Relative: 4 %
HCT: 37.1 % (ref 36.0–46.0)
Hemoglobin: 11.4 g/dL — ABNORMAL LOW (ref 12.0–15.0)
Immature Granulocytes: 1 %
Lymphocytes Relative: 25 %
Lymphs Abs: 2.2 K/uL (ref 0.7–4.0)
MCH: 30.9 pg (ref 26.0–34.0)
MCHC: 30.7 g/dL (ref 30.0–36.0)
MCV: 100.5 fL — ABNORMAL HIGH (ref 80.0–100.0)
Monocytes Absolute: 0.5 K/uL (ref 0.1–1.0)
Monocytes Relative: 6 %
Neutro Abs: 5.5 K/uL (ref 1.7–7.7)
Neutrophils Relative %: 63 %
Platelets: 228 K/uL (ref 150–400)
RBC: 3.69 MIL/uL — ABNORMAL LOW (ref 3.87–5.11)
RDW: 12 % (ref 11.5–15.5)
WBC: 8.6 K/uL (ref 4.0–10.5)
nRBC: 0 % (ref 0.0–0.2)

## 2024-01-30 LAB — IRON AND TIBC
Iron: 84 ug/dL (ref 28–170)
Saturation Ratios: 27 % (ref 10.4–31.8)
TIBC: 308 ug/dL (ref 250–450)
UIBC: 224 ug/dL

## 2024-01-30 LAB — COMPREHENSIVE METABOLIC PANEL WITH GFR
ALT: 23 U/L (ref 0–44)
AST: 22 U/L (ref 15–41)
Albumin: 3.4 g/dL — ABNORMAL LOW (ref 3.5–5.0)
Alkaline Phosphatase: 120 U/L (ref 38–126)
Anion gap: 8 (ref 5–15)
BUN: 48 mg/dL — ABNORMAL HIGH (ref 6–20)
CO2: 19 mmol/L — ABNORMAL LOW (ref 22–32)
Calcium: 8.7 mg/dL — ABNORMAL LOW (ref 8.9–10.3)
Chloride: 107 mmol/L (ref 98–111)
Creatinine, Ser: 2.24 mg/dL — ABNORMAL HIGH (ref 0.44–1.00)
GFR, Estimated: 25 mL/min — ABNORMAL LOW (ref >60)
Glucose, Bld: 119 mg/dL — ABNORMAL HIGH (ref 70–99)
Potassium: 5.3 mmol/L — ABNORMAL HIGH (ref 3.5–5.1)
Sodium: 134 mmol/L — ABNORMAL LOW (ref 135–145)
Total Bilirubin: 0.4 mg/dL (ref 0.0–1.2)
Total Protein: 6.7 g/dL (ref 6.5–8.1)

## 2024-01-30 LAB — RETICULOCYTES
Immature Retic Fract: 5.7 % (ref 2.3–15.9)
RBC.: 3.69 MIL/uL — ABNORMAL LOW (ref 3.87–5.11)
Retic Count, Absolute: 48 K/uL (ref 19.0–186.0)
Retic Ct Pct: 1.3 % (ref 0.4–3.1)

## 2024-01-30 LAB — TSH: TSH: 0.3 u[IU]/mL — ABNORMAL LOW (ref 0.350–4.500)

## 2024-01-30 LAB — VITAMIN B12: Vitamin B-12: 3531 pg/mL — ABNORMAL HIGH (ref 180–914)

## 2024-01-30 NOTE — Progress Notes (Signed)
 Patient is a new patient, here for anemia and kidney disease.

## 2024-01-30 NOTE — Progress Notes (Signed)
 Hematology/Oncology Consult note [facility] [phone] [phone]  Patient Care Team: [name], NP as PCP - General (Family Medicine) Debby Fidela CROME, NP as Nurse Practitioner (Physical Medicine and Rehabilitation) Onita Duos, MD as Consulting Physician (Neurology)   Name of the patient: Sabrina Williams  [id number]  [birth year]    Reason for referral-anemia of kidney disease   Referring physician-Dr. Marcelino  Date of visit: 01/30/24   History of presenting illness- Patient is a 53 year old female with prior history of hypertension, stage IV CKD who sees Dr. Lazarus.  She has been referred for anemia.  CBC from 12/06/2023 showed white count of 9, H&H of 11/36.7 with an MCV of 101 and a platelet count of 224.  Her hemoglobin in March 2023 was 11.7 and over the last 2 years she has been fluctuating mostly between 10-11 without a clear downward trend.  Last iron studies from March 2025 showed a ferritin level of 60.  ECOG PS- 1  Pain scale- 0   Review of systems- Review of Systems  Constitutional:  Positive for malaise/fatigue. Negative for chills, fever and weight loss.  HENT:  Negative for congestion, ear discharge and nosebleeds.   Eyes:  Negative for blurred vision.  Respiratory:  Negative for cough, hemoptysis, sputum production, shortness of breath and wheezing.   Cardiovascular:  Negative for chest pain, palpitations, orthopnea and claudication.  Gastrointestinal:  Negative for abdominal pain, blood in stool, constipation, diarrhea, heartburn, melena, nausea and vomiting.  Genitourinary:  Negative for dysuria, flank pain, frequency, hematuria and urgency.  Musculoskeletal:  Negative for back pain, joint pain and myalgias.  Skin:  Negative for rash.  Neurological:  Negative for dizziness, tingling, focal weakness, seizures, weakness and headaches.  Endo/Heme/Allergies:  Does not bruise/bleed easily.  Psychiatric/Behavioral:   Negative for depression and suicidal ideas. The patient does not have insomnia.     Allergies  Allergen Reactions   Penicillins Hives and Rash   Nsaids     Other Reaction(s): Not available    Patient Active Problem List   Diagnosis Date Noted   AKI (acute kidney injury) (HCC) 12/03/2021   Sleep paralysis, recurrent isolated 09/03/2021   Status post bariatric surgery 09/03/2021   Postural dizziness with presyncope 09/03/2021   Hypersomnia, periodic 09/03/2021   Class 3 severe obesity due to excess calories with body mass index (BMI) of 40.0 to 44.9 in adult 09/03/2021   Obstructive sleep apnea 08/06/2021   Dizziness 08/06/2021   Achilles tendinitis 07/31/2021   Calcaneal spur of right foot 07/14/2021   Acquired pes planus of right foot 06/24/2021   Metatarsalgia of right foot 06/24/2021   Sprain of foot 06/24/2021   Gastroesophageal reflux disease without esophagitis 05/29/2021   Vagal nerve sensitivity 05/29/2021   Alopecia 01/02/2021   Anemia 01/02/2021   Elevated parathyroid hormone 01/02/2021   Hypomagnesemia 01/02/2021   Postprandial hypoglycemia 10/14/2020   Myoclonic jerking 04/02/2019   Benign essential hypertension 03/21/2019   Chronic renal failure, stage 3b (HCC) 03/21/2019   Acquired trigger finger 05/02/2018   Lump on finger 05/02/2018   Anxiety and depression 08/17/2017   Cervical radiculitis 06/03/2017   Heart murmur, systolic 02/27/2017   Alkaline phosphatase elevation 02/21/2017   History of weight loss surgery 02/17/2017   Vitamin D deficiency 11/24/2015   Steatosis of liver 11/14/2015   Diabetes mellitus (HCC) 09/23/2015   Pure hypercholesterolemia 09/23/2015   Multiple joint pain 07/07/2015   Muscle pain 07/07/2015   Right shoulder pain 12/05/2014  Complex regional pain syndrome type 1 of right lower extremity 07/05/2014   Increased frequency of urination 08/21/2013   Kidney stone 07/04/2012   Cervical postlaminectomy syndrome 09/07/2011    Cervical radiculopathy at C6 09/07/2011     Past Medical History:  Diagnosis Date   Alopecia    Anemia    Anxiety    Cervical radiculopathy at C6    Chronic kidney disease    stones   Chronic pain    Complex regional pain syndrome I    right foot   Depression    Diabetes mellitus type 2, uncomplicated (HCC)    GERD (gastroesophageal reflux disease)    Heart murmur    History of kidney stones    Hypercholesterolemia    Hypertension, essential, benign    Kidney stone    Morbid obesity (HCC)    Neuromuscular disorder (HCC)    Plantar fasciitis    Stage 4 chronic kidney disease (HCC)    Steatosis of liver    Tremor    Vitamin D deficiency      Past Surgical History:  Procedure Laterality Date   CERVICAL SPINE SURGERY  2005   C6-7 fusion   CESAREAN SECTION     x2   CYSTOSCOPY WITH RETROGRADE PYELOGRAM, URETEROSCOPY AND STENT PLACEMENT Left 06/21/2022   Procedure: CYSTOSCOPY WITH RETROGRADE PYELOGRAM, URETEROSCOPY AND STENT PLACEMENT;  Surgeon: Penne Knee, MD;  Location: [facility];  Service: Urology;  Laterality: Left;   CYSTOSCOPY/URETEROSCOPY/HOLMIUM LASER/STENT PLACEMENT Left 07/06/2022   Procedure: CYSTOSCOPY/URETEROSCOPY/HOLMIUM LASER/STENT EXCHANGE;  Surgeon: Twylla Glendia BROCKS, MD;  Location: [facility];  Service: Urology;  Laterality: Left;   dental implant  11/2013   ENDOSCOPIC PLANTAR FASCIOTOMY Right 12/2013   HAND SURGERY Right    KNEE ARTHROSCOPY  1985   LITHOTRIPSY     multiple   PARTIAL GASTRECTOMY  01/01/2016   PLANTAR FASCIECTOMY Right    SHOULDER SURGERY Left 10/2004   SMALL INTESTINE SURGERY     cervical laminectomy   utereroscopy  1995    Social History   Socioeconomic History   Marital status: Married    Spouse name: Not on file   Number of children: 2   Years of education: some college   Highest education level: Not on file  Occupational History   Occupation: homemaker  Tobacco Use   Smoking status: Never   Smokeless tobacco: Never   Vaping Use   Vaping status: Never Used  Substance and Sexual Activity   Alcohol  use: No    Alcohol /week: 0.0 standard drinks of alcohol    Drug use: No   Sexual activity: Not Currently  Other Topics Concern   Not on file  Social History Narrative   Lives at home with husband.   Right-handed.   No daily caffeine use.   Social Drivers of Corporate investment banker Strain: Low Risk  (08/16/2023)   Received [facility]   Overall Financial Resource Strain (CARDIA)    Difficulty of Paying Living Expenses: Not hard at all  Food Insecurity: No Food Insecurity (01/30/2024)   Hunger Vital Sign    Worried About Running Out of Food in the Last Year: Never true    Ran Out of Food in the Last Year: Never true  Transportation Needs: No Transportation Needs (01/30/2024)   PRAPARE - Administrator, Civil Service (Medical): No    Lack of Transportation (Non-Medical): No  Physical Activity: Not on file  Stress: Not on file  Social Connections: Not on file  Intimate Partner Violence: Not At Risk (01/30/2024)   Humiliation, Afraid, Rape, and Kick questionnaire    Fear of Current or Ex-Partner: No    Emotionally Abused: No    Physically Abused: No    Sexually Abused: No     Family History  Problem Relation Age of Onset   Cancer Mother        multiple myeloma   Diabetes Father    Kidney disease Father    Heart disease Father      Current Outpatient Medications:    acetaminophen  (TYLENOL ) 500 MG tablet, Take by mouth., Disp: , Rfl:    benazepril (LOTENSIN) 20 MG tablet, Take 20 mg by mouth daily. 1/2 a pill, Disp: , Rfl:    calcitRIOL  (ROCALTROL ) 0.5 MCG capsule, Take 0.5 mcg by mouth in the morning and at bedtime., Disp: , Rfl:    calcium  carbonate 1250 MG capsule, Take 1,250 mg by mouth daily., Disp: , Rfl:    Cholecalciferol (VITAMIN D3 PO), Take 2,000 Units by mouth daily., Disp: , Rfl:    DULoxetine  (CYMBALTA ) 60 MG capsule, Take 60 mg by mouth daily.,  Disp: , Rfl:    ferrous sulfate  325 (65 FE) MG tablet, Take 325 mg by mouth daily with breakfast., Disp: , Rfl:    Fingerstix Lancets MISC, Use 1 each 2 (two) times daily As directed [Test blood sugars in rotating pattern:  fasting, before meals, 2 hours after a meal, at bedtime], Disp: , Rfl:    gabapentin  (NEURONTIN ) 100 MG capsule, TAKE 1 CAPSULE BY MOUTH EVERY DAY, Disp: 30 capsule, Rfl: 3   glucose blood test strip, USE 1 STRIP TWICE A DAY AS DIRECTED, Disp: , Rfl:    lovastatin (MEVACOR) 20 MG tablet, Take 20 mg by mouth every evening., Disp: , Rfl:    magnesium oxide (MAG-OX) 400 (240 Mg) MG tablet, Take 1 tablet by mouth daily., Disp: , Rfl:    Multiple Vitamins-Minerals (BARIATRIC MULTIVITAMINS/IRON) CAPS, Take 3 capsules by mouth daily before supper., Disp: , Rfl:    nortriptyline  (PAMELOR ) 75 MG capsule, TAKE 1 CAPSULE BY MOUTH AT BEDTIME., Disp: 90 capsule, Rfl: 2   omeprazole (PRILOSEC) 10 MG capsule, Take 1 tablet by mouth daily., Disp: , Rfl:    Oxycodone  HCl 10 MG TABS, Take 1 tablet (10 mg total) by mouth 5 (five) times daily as needed., Disp: 150 tablet, Rfl: 0   tiZANidine  (ZANAFLEX ) 4 MG tablet, TAKE 1 TABLET (4 MG TOTAL) BY MOUTH 5 (FIVE) TIMES DAILY AS NEEDED FOR MUSCLE SPASMS., Disp: 150 tablet, Rfl: 5   VITAMIN C, CALCIUM  ASCORBATE, PO, , Disp: , Rfl:    Vitamin D, Ergocalciferol, (DRISDOL) 1.25 MG (50000 UNIT) CAPS capsule, TAKE 1 CAPSULE (50,000 UNITS TOTAL) BY MOUTH TWICE A WEEK FOR 30 DAYS, Disp: , Rfl:    amLODipine (NORVASC) 2.5 MG tablet, Take 2.5 mg by mouth 2 (two) times daily. (Patient not taking: Reported on 01/30/2024), Disp: , Rfl:    estradiol  (ESTRACE ) 0.1 MG/GM vaginal cream, Apply one pea-sized amount around the opening of the urethra daily for 2 weeks, then 3 times weekly moving forward. (Patient not taking: Reported on 01/30/2024), Disp: 42.5 g, Rfl: 12   LAGEVRIO 200 MG CAPS capsule, TAKE 4 CAPSULES (800 MG TOTAL) BY MOUTH TWICE A DAY FOR 5 DAYS (Patient not  taking: Reported on 01/30/2024), Disp: , Rfl:    methocarbamol (ROBAXIN) 500 MG tablet, Take 500 mg by mouth 3 (three) times  daily. (Patient not taking: Reported on 01/30/2024), Disp: , Rfl:    oxybutynin  (DITROPAN -XL) 10 MG 24 hr tablet, TAKE 1 TABLET BY MOUTH EVERYDAY AT BEDTIME (Patient not taking: Reported on 01/30/2024), Disp: 90 tablet, Rfl: 1   tamsulosin  (FLOMAX ) 0.4 MG CAPS capsule, Take 1 capsule (0.4 mg total) by mouth daily. (Patient not taking: Reported on 01/30/2024), Disp: 30 capsule, Rfl: 0   torsemide (DEMADEX) 5 MG tablet, Take 5 mg by mouth 2 (two) times daily. (Patient not taking: Reported on 01/30/2024), Disp: , Rfl:    Physical exam:  Vitals:   01/30/24 1106  BP: 115/80  Pulse: 87  Resp: 18  Temp: (!) 97.3 F (36.3 C)  TempSrc: Tympanic  SpO2: 100%  Weight: 252 lb 4.8 oz (114.4 kg)  Height: 5' 7 (1.702 m)   Physical Exam Cardiovascular:     Rate and Rhythm: Normal rate and regular rhythm.     Heart sounds: Normal heart sounds.  Pulmonary:     Effort: Pulmonary effort is normal.     Breath sounds: Normal breath sounds.  Abdominal:     General: Bowel sounds are normal.     Palpations: Abdomen is soft.  Skin:    General: Skin is warm and dry.  Neurological:     Mental Status: She is alert and oriented to person, place, and time.           Latest Ref Rng & Units 11/22/2022   11:15 AM  CMP  Creatinine 0.57 - 1.00 mg/dL 7.56   Sodium 865 - 855 mmol/L 142   Potassium 3.5 - 5.2 mmol/L 5.8   Chloride 96 - 106 mmol/L 108   CO2 20 - 29 mmol/L 20   Calcium  8.7 - 10.2 mg/dL 9.0       Latest Ref Rng & Units 07/06/2022   12:58 PM  CBC  Hemoglobin 12.0 - 15.0 g/dL 87.0   Hematocrit 63.9 - 46.0 % 38.0      Assessment and plan- Patient is a 53 y.o. female referred for anemia of chronic kidney disease  I will obtain a complete anemia workup today including CBC with differential CMP ferritin and iron studies B12 folate TSH myeloma panel reticulocyte count LDH and  serum free light chains.  I will see her back in 1 to 2 weeks time for a virtual visit.  If there is no reversible cause of anemia on her peripheral labs I will continue to monitor her hemoglobin conservatively given that it is stable between 10-11.  If it drifts down to less than 10 we will plan to initiate EPO at that time.  Discussed risks and benefits of EPO including all but not limited to possible risk of thromboembolic events and typically it is recommended to keep the hemoglobin 10-11 on EPO.  If she is already there we do not have to initiate EPO at this time.   Thank you for this kind referral and the opportunity to participate in the care of this  Patient   Visit Diagnosis 1. Anemia of chronic kidney failure, stage 4 (severe) (HCC)     Dr. Annah Skene, MD, MPH [facility] [phone] 01/30/2024

## 2024-01-31 LAB — KAPPA/LAMBDA LIGHT CHAINS
Kappa free light chain: 79.1 mg/L — ABNORMAL HIGH (ref 3.3–19.4)
Kappa, lambda light chain ratio: 1.52 (ref 0.26–1.65)
Lambda free light chains: 51.9 mg/L — ABNORMAL HIGH (ref 5.7–26.3)

## 2024-02-01 LAB — MULTIPLE MYELOMA PANEL, SERUM
Albumin SerPl Elph-Mcnc: 3.2 g/dL (ref 2.9–4.4)
Albumin/Glob SerPl: 1.2 (ref 0.7–1.7)
Alpha 1: 0.2 g/dL (ref 0.0–0.4)
Alpha2 Glob SerPl Elph-Mcnc: 0.8 g/dL (ref 0.4–1.0)
B-Globulin SerPl Elph-Mcnc: 0.8 g/dL (ref 0.7–1.3)
Gamma Glob SerPl Elph-Mcnc: 0.9 g/dL (ref 0.4–1.8)
Globulin, Total: 2.8 g/dL (ref 2.2–3.9)
IgA: 180 mg/dL (ref 87–352)
IgG (Immunoglobin G), Serum: 853 mg/dL (ref 586–1602)
IgM (Immunoglobulin M), Srm: 147 mg/dL (ref 26–217)
Total Protein ELP: 6 g/dL (ref 6.0–8.5)

## 2024-02-01 LAB — HAPTOGLOBIN: Haptoglobin: 170 mg/dL (ref 33–346)

## 2024-02-09 ENCOUNTER — Encounter: Payer: Self-pay | Admitting: Oncology

## 2024-02-09 ENCOUNTER — Other Ambulatory Visit: Payer: Self-pay

## 2024-02-09 ENCOUNTER — Inpatient Hospital Stay: Admitting: Oncology

## 2024-02-09 DIAGNOSIS — N184 Chronic kidney disease, stage 4 (severe): Secondary | ICD-10-CM | POA: Diagnosis not present

## 2024-02-09 DIAGNOSIS — D631 Anemia in chronic kidney disease: Secondary | ICD-10-CM | POA: Diagnosis not present

## 2024-02-09 NOTE — Progress Notes (Unsigned)
 Attempted to check patient in; voice message left.

## 2024-02-09 NOTE — Progress Notes (Unsigned)
 Pain 3/10 left shoulder, left arm, right knee and right foot (chronic).  Neuropathy in left arm and right foot.

## 2024-02-09 NOTE — Progress Notes (Unsigned)
 Attempted calling to check in; call went straight to voicemail. Will try again shortly.

## 2024-02-09 NOTE — Progress Notes (Unsigned)
 Called again phone rang several times prior to going to voicemail.

## 2024-02-10 NOTE — Progress Notes (Signed)
 I connected with Sabrina Williams on 02/10/24 at 10:30 AM EDT by video enabled telemedicine visit and verified that I am speaking with the correct person using two identifiers.   I discussed the limitations, risks, security and privacy concerns of performing an evaluation and management service by telemedicine and the availability of in-person appointments. I also discussed with the patient that there may be a patient responsible charge related to this service. The patient expressed understanding and agreed to proceed.  Other persons participating in the visit and their role in the encounter:  none  Patient's location:  home Provider's location:  home  Chief Complaint:  discuss results of bloodwork  History of present illness: Patient is a 53 year old female with prior history of hypertension, stage IV CKD who sees Dr. Lazarus. She has been referred for anemia. CBC from 12/06/2023 showed white count of 9, H&H of 11/36.7 with an MCV of 101 and a platelet count of 224. Her hemoglobin in March 2023 was 11.7 and over the last 2 years she has been fluctuating mostly between 10-11 without a clear downward trend. Last iron studies from March 2025 showed a ferritin level of 60.   Results of anemia workup from 01/30/2024 showed white count of 8.6, H&H of 11.4/27.1 with an MCV of 100.5 and a platelet count of 228.  B12 levels were elevated at 3531.  Folate normal.  Ferritin levels normal at 80.  Haptoglobin normal.  TSH was mildly low at 0.3 both kappa and lambda free light chains were elevated with a normal free light chain ratio.  Myeloma panel showed no evidence of M protein.  Interval history patient has chronic joint pain in multiple joints.  Also has neuropathy in her right foot   Review of Systems  Constitutional:  Negative for chills, fever, malaise/fatigue and weight loss.  HENT:  Negative for congestion, ear discharge and nosebleeds.   Eyes:  Negative for blurred vision.  Respiratory:  Negative for  cough, hemoptysis, sputum production, shortness of breath and wheezing.   Cardiovascular:  Negative for chest pain, palpitations, orthopnea and claudication.  Gastrointestinal:  Negative for abdominal pain, blood in stool, constipation, diarrhea, heartburn, melena, nausea and vomiting.  Genitourinary:  Negative for dysuria, flank pain, frequency, hematuria and urgency.  Musculoskeletal:  Positive for joint pain. Negative for back pain and myalgias.  Skin:  Negative for rash.  Neurological:  Positive for sensory change (Peripheral neuropathy). Negative for dizziness, tingling, focal weakness, seizures, weakness and headaches.  Endo/Heme/Allergies:  Does not bruise/bleed easily.  Psychiatric/Behavioral:  Negative for depression and suicidal ideas. The patient does not have insomnia.     Allergies  Allergen Reactions   Penicillins Hives and Rash   Nsaids     Other Reaction(s): Not available    Past Medical History:  Diagnosis Date   Alopecia    Anemia    Anxiety    Cervical radiculopathy at C6    Chronic kidney disease    stones   Chronic pain    Complex regional pain syndrome I    right foot   Depression    Diabetes mellitus type 2, uncomplicated (HCC)    GERD (gastroesophageal reflux disease)    Heart murmur    History of kidney stones    Hypercholesterolemia    Hypertension, essential, benign    Kidney stone    Morbid obesity (HCC)    Neuromuscular disorder (HCC)    Plantar fasciitis    Stage 4 chronic kidney disease (HCC)  Steatosis of liver    Tremor    Vitamin D deficiency     Past Surgical History:  Procedure Laterality Date   CERVICAL SPINE SURGERY  2005   C6-7 fusion   CESAREAN SECTION     x2   CYSTOSCOPY WITH RETROGRADE PYELOGRAM, URETEROSCOPY AND STENT PLACEMENT Left 06/21/2022   Procedure: CYSTOSCOPY WITH RETROGRADE PYELOGRAM, URETEROSCOPY AND STENT PLACEMENT;  Surgeon: Penne Knee, MD;  Location: [facility];  Service: Urology;  Laterality: Left;    CYSTOSCOPY/URETEROSCOPY/HOLMIUM LASER/STENT PLACEMENT Left 07/06/2022   Procedure: CYSTOSCOPY/URETEROSCOPY/HOLMIUM LASER/STENT EXCHANGE;  Surgeon: Twylla Glendia BROCKS, MD;  Location: [facility];  Service: Urology;  Laterality: Left;   dental implant  11/2013   ENDOSCOPIC PLANTAR FASCIOTOMY Right 12/2013   HAND SURGERY Right    KNEE ARTHROSCOPY  1985   LITHOTRIPSY     multiple   PARTIAL GASTRECTOMY  01/01/2016   PLANTAR FASCIECTOMY Right    SHOULDER SURGERY Left 10/2004   SMALL INTESTINE SURGERY     cervical laminectomy   utereroscopy  1995    Social History   Socioeconomic History   Marital status: Married    Spouse name: Not on file   Number of children: 2   Years of education: some college   Highest education level: Not on file  Occupational History   Occupation: homemaker  Tobacco Use   Smoking status: Never   Smokeless tobacco: Never  Vaping Use   Vaping status: Never Used  Substance and Sexual Activity   Alcohol  use: No    Alcohol /week: 0.0 standard drinks of alcohol    Drug use: No   Sexual activity: Not Currently  Other Topics Concern   Not on file  Social History Narrative   Lives at home with husband.   Right-handed.   No daily caffeine use.   Social Drivers of Corporate investment banker Strain: Low Risk  (08/16/2023)   Received from [facility]   Overall Financial Resource Strain (CARDIA)    Difficulty of Paying Living Expenses: Not hard at all  Food Insecurity: No Food Insecurity (01/30/2024)   Hunger Vital Sign    Worried About Running Out of Food in the Last Year: Never true    Ran Out of Food in the Last Year: Never true  Transportation Needs: No Transportation Needs (01/30/2024)   PRAPARE - Administrator, Civil Service (Medical): No    Lack of Transportation (Non-Medical): No  Physical Activity: Not on file  Stress: Not on file  Social Connections: Not on file  Intimate Partner Violence: Not At Risk (01/30/2024)    Humiliation, Afraid, Rape, and Kick questionnaire    Fear of Current or Ex-Partner: No    Emotionally Abused: No    Physically Abused: No    Sexually Abused: No    Family History  Problem Relation Age of Onset   Cancer Mother        multiple myeloma   Diabetes Father    Kidney disease Father    Heart disease Father      Current Outpatient Medications:    acetaminophen  (TYLENOL ) 500 MG tablet, Take by mouth., Disp: , Rfl:    benazepril (LOTENSIN) 20 MG tablet, Take 20 mg by mouth daily. 1/2 a pill, Disp: , Rfl:    calcitRIOL  (ROCALTROL ) 0.5 MCG capsule, Take 0.5 mcg by mouth in the morning and at bedtime., Disp: , Rfl:    calcium  carbonate 1250 MG capsule, Take 1,250 mg by mouth daily., Disp: ,  Rfl:    Cholecalciferol (VITAMIN D3 PO), Take 2,000 Units by mouth daily., Disp: , Rfl:    DULoxetine  (CYMBALTA ) 60 MG capsule, Take 60 mg by mouth daily., Disp: , Rfl:    ferrous sulfate  325 (65 FE) MG tablet, Take 325 mg by mouth daily with breakfast., Disp: , Rfl:    Fingerstix Lancets MISC, Use 1 each 2 (two) times daily As directed [Test blood sugars in rotating pattern:  fasting, before meals, 2 hours after a meal, at bedtime], Disp: , Rfl:    gabapentin  (NEURONTIN ) 100 MG capsule, TAKE 1 CAPSULE BY MOUTH EVERY DAY, Disp: 30 capsule, Rfl: 3   glucose blood test strip, USE 1 STRIP TWICE A DAY AS DIRECTED, Disp: , Rfl:    lovastatin (MEVACOR) 20 MG tablet, Take 20 mg by mouth every evening., Disp: , Rfl:    Magnesium Glycinate 100 MG CAPS, Take by mouth., Disp: , Rfl:    Multiple Vitamins-Minerals (BARIATRIC MULTIVITAMINS/IRON) CAPS, Take 3 capsules by mouth daily before supper., Disp: , Rfl:    omeprazole (PRILOSEC) 10 MG capsule, Take 1 tablet by mouth daily., Disp: , Rfl:    Oxycodone  HCl 10 MG TABS, Take 1 tablet (10 mg total) by mouth 5 (five) times daily as needed., Disp: 150 tablet, Rfl: 0   tiZANidine  (ZANAFLEX ) 4 MG tablet, TAKE 1 TABLET (4 MG TOTAL) BY MOUTH 5 (FIVE) TIMES DAILY  AS NEEDED FOR MUSCLE SPASMS., Disp: 150 tablet, Rfl: 5   VITAMIN C, CALCIUM  ASCORBATE, PO, , Disp: , Rfl:    Vitamin D, Ergocalciferol, (DRISDOL) 1.25 MG (50000 UNIT) CAPS capsule, TAKE 1 CAPSULE (50,000 UNITS TOTAL) BY MOUTH TWICE A WEEK FOR 30 DAYS, Disp: , Rfl:    amLODipine (NORVASC) 2.5 MG tablet, Take 2.5 mg by mouth 2 (two) times daily. (Patient not taking: Reported on 02/09/2024), Disp: , Rfl:    estradiol  (ESTRACE ) 0.1 MG/GM vaginal cream, Apply one pea-sized amount around the opening of the urethra daily for 2 weeks, then 3 times weekly moving forward. (Patient not taking: Reported on 02/09/2024), Disp: 42.5 g, Rfl: 12   LAGEVRIO 200 MG CAPS capsule, TAKE 4 CAPSULES (800 MG TOTAL) BY MOUTH TWICE A DAY FOR 5 DAYS (Patient not taking: Reported on 02/09/2024), Disp: , Rfl:    magnesium oxide (MAG-OX) 400 (240 Mg) MG tablet, Take 1 tablet by mouth daily. (Patient not taking: Reported on 02/09/2024), Disp: , Rfl:    methocarbamol (ROBAXIN) 500 MG tablet, Take 500 mg by mouth 3 (three) times daily. (Patient not taking: Reported on 02/09/2024), Disp: , Rfl:    nortriptyline  (PAMELOR ) 75 MG capsule, TAKE 1 CAPSULE BY MOUTH AT BEDTIME., Disp: 90 capsule, Rfl: 2   oxybutynin  (DITROPAN -XL) 10 MG 24 hr tablet, TAKE 1 TABLET BY MOUTH EVERYDAY AT BEDTIME (Patient not taking: Reported on 02/09/2024), Disp: 90 tablet, Rfl: 1   tamsulosin  (FLOMAX ) 0.4 MG CAPS capsule, Take 1 capsule (0.4 mg total) by mouth daily. (Patient not taking: Reported on 01/30/2024), Disp: 30 capsule, Rfl: 0   torsemide (DEMADEX) 5 MG tablet, Take 5 mg by mouth 2 (two) times daily. (Patient not taking: Reported on 02/09/2024), Disp: , Rfl:   No results found.  No images are attached to the encounter.      Latest Ref Rng & Units 01/30/2024   11:35 AM  CMP  Glucose 70 - 99 mg/dL 880   BUN 6 - 20 mg/dL 48   Creatinine 9.55 - 1.00 mg/dL 7.75   Sodium 864 -  145 mmol/L 134   Potassium 3.5 - 5.1 mmol/L 5.3   Chloride 98 - 111 mmol/L 107    CO2 22 - 32 mmol/L 19   Calcium  8.9 - 10.3 mg/dL 8.7   Total Protein 6.5 - 8.1 g/dL 6.7   Total Bilirubin 0.0 - 1.2 mg/dL 0.4   Alkaline Phos 38 - 126 U/L 120   AST 15 - 41 U/L 22   ALT 0 - 44 U/L 23       Latest Ref Rng & Units 01/30/2024   11:35 AM  CBC  WBC 4.0 - 10.5 K/uL 8.6   Hemoglobin 12.0 - 15.0 g/dL 88.5   Hematocrit 63.9 - 46.0 % 37.1   Platelets 150 - 400 K/uL 228      Observation/objective: Appears in no acute distress over video visit today.  Breathing is nonlabored  Assessment and plan: Patient is a 53 year old female referred for anemia of chronic kidney disease  Patient presently has mild anemia with a hemoglobin of 11.4.  Results of peripheral blood anemia workup showed normal iron studies B12 folate TSH and no evidence of multiple myeloma.  Patient does not require any bone marrow biopsy at this time and her anemia can be monitored conservatively.  We will plan to keep her iron saturation close to 20% and ferritin close to 100.  If her hemoglobin drops down to less than 9 we will consider initiating EPO at that time.  CBC ferritin and iron studies in 4 and 8 months and I will see her back in 8 months  Follow-up instructions: As above  I discussed the assessment and treatment plan with the patient. The patient was provided an opportunity to ask questions and all were answered. The patient agreed with the plan and demonstrated an understanding of the instructions.   The patient was advised to call back or seek an in-person evaluation if the symptoms worsen or if the condition fails to improve as anticipated.  I provided 11 minutes of face-to-face video visit time during this encounter, including review of labs and > 50% was spent counseling as documented under my assessment & plan.  Visit Diagnosis: 1. Anemia of chronic kidney failure, stage 4 (severe) (HCC)     Dr. Annah Skene, MD, MPH [facility] Tel- [phone] 02/10/2024 8:33  AM

## 2024-02-22 NOTE — Progress Notes (Unsigned)
 Subjective:    Patient ID: Sabrina Williams, female    DOB: Aug 30, 1970, 53 y.o.   MRN: [id number]  HPI: Sabrina Williams is a 53 y.o. female who returns for follow up appointment for chronic pain and medication refill. She states her pain is located in her neck radiaiting into her left shoulder, left arm with numbness and tingling. Also reports upper- back pain mainly left side and bilateral knee pain L>R  and right foot pain with tingling and burning. She rates her pain 3. Her current exercise regime is walking and performing stretching exercises.  Mr. Soucy Morphine  equivalent is 75.00 MME.   Oral Swab was Performed today.      Pain Inventory Average Pain 5 Pain Right Now 3 My pain is constant, sharp, burning, dull, tingling, and aching  In the last 24 hours, has pain interfered with the following? General activity 6 Relation with others 4 Enjoyment of life 6 What TIME of day is your pain at its worst? evening Sleep (in general) Fair  Pain is worse with: walking, standing, and some activites Pain improves with: rest, heat/ice, therapy/exercise, and medication Relief from Meds: 7  Family History  Problem Relation Age of Onset   Cancer Mother        multiple myeloma   Diabetes Father    Kidney disease Father    Heart disease Father    Social History   Socioeconomic History   Marital status: Married    Spouse name: Not on file   Number of children: 2   Years of education: some college   Highest education level: Not on file  Occupational History   Occupation: homemaker  Tobacco Use   Smoking status: Never   Smokeless tobacco: Never  Vaping Use   Vaping status: Never Used  Substance and Sexual Activity   Alcohol  use: No    Alcohol /week: 0.0 standard drinks of alcohol    Drug use: No   Sexual activity: Not Currently  Other Topics Concern   Not on file  Social History Narrative   Lives at home with husband.   Right-handed.   No daily caffeine use.   Social  Drivers of Corporate investment banker Strain: Low Risk  (08/16/2023)   Received from [facility]   Overall Financial Resource Strain (CARDIA)    Difficulty of Paying Living Expenses: Not hard at all  Food Insecurity: No Food Insecurity (01/30/2024)   Hunger Vital Sign    Worried About Running Out of Food in the Last Year: Never true    Ran Out of Food in the Last Year: Never true  Transportation Needs: No Transportation Needs (01/30/2024)   PRAPARE - Administrator, Civil Service (Medical): No    Lack of Transportation (Non-Medical): No  Physical Activity: Not on file  Stress: Not on file  Social Connections: Not on file   Past Surgical History:  Procedure Laterality Date   CERVICAL SPINE SURGERY  2005   C6-7 fusion   CESAREAN SECTION     x2   CYSTOSCOPY WITH RETROGRADE PYELOGRAM, URETEROSCOPY AND STENT PLACEMENT Left 06/21/2022   Procedure: CYSTOSCOPY WITH RETROGRADE PYELOGRAM, URETEROSCOPY AND STENT PLACEMENT;  Surgeon: Penne Knee, MD;  Location: [facility];  Service: Urology;  Laterality: Left;   CYSTOSCOPY/URETEROSCOPY/HOLMIUM LASER/STENT PLACEMENT Left 07/06/2022   Procedure: CYSTOSCOPY/URETEROSCOPY/HOLMIUM LASER/STENT EXCHANGE;  Surgeon: Twylla Glendia BROCKS, MD;  Location: [facility];  Service: Urology;  Laterality: Left;   dental implant  11/2013  ENDOSCOPIC PLANTAR FASCIOTOMY Right 12/2013   HAND SURGERY Right    KNEE ARTHROSCOPY  1985   LITHOTRIPSY     multiple   PARTIAL GASTRECTOMY  01/01/2016   PLANTAR FASCIECTOMY Right    SHOULDER SURGERY Left 10/2004   SMALL INTESTINE SURGERY     cervical laminectomy   utereroscopy  1995   Past Surgical History:  Procedure Laterality Date   CERVICAL SPINE SURGERY  2005   C6-7 fusion   CESAREAN SECTION     x2   CYSTOSCOPY WITH RETROGRADE PYELOGRAM, URETEROSCOPY AND STENT PLACEMENT Left 06/21/2022   Procedure: CYSTOSCOPY WITH RETROGRADE PYELOGRAM, URETEROSCOPY AND STENT PLACEMENT;  Surgeon: Penne Knee, MD;  Location: [facility];  Service: Urology;  Laterality: Left;   CYSTOSCOPY/URETEROSCOPY/HOLMIUM LASER/STENT PLACEMENT Left 07/06/2022   Procedure: CYSTOSCOPY/URETEROSCOPY/HOLMIUM LASER/STENT EXCHANGE;  Surgeon: Twylla Glendia BROCKS, MD;  Location: [facility];  Service: Urology;  Laterality: Left;   dental implant  11/2013   ENDOSCOPIC PLANTAR FASCIOTOMY Right 12/2013   HAND SURGERY Right    KNEE ARTHROSCOPY  1985   LITHOTRIPSY     multiple   PARTIAL GASTRECTOMY  01/01/2016   PLANTAR FASCIECTOMY Right    SHOULDER SURGERY Left 10/2004   SMALL INTESTINE SURGERY     cervical laminectomy   utereroscopy  1995   Past Medical History:  Diagnosis Date   Alopecia    Anemia    Anxiety    Cervical radiculopathy at C6    Chronic kidney disease    stones   Chronic pain    Complex regional pain syndrome I    right foot   Depression    Diabetes mellitus type 2, uncomplicated (HCC)    GERD (gastroesophageal reflux disease)    Heart murmur    History of kidney stones    Hypercholesterolemia    Hypertension, essential, benign    Kidney stone    Morbid obesity (HCC)    Neuromuscular disorder (HCC)    Plantar fasciitis    Stage 4 chronic kidney disease (HCC)    Steatosis of liver    Tremor    Vitamin D deficiency    LMP 12/02/2019   Opioid Risk Score:   Fall Risk Score:  `1  Depression screen [facility] 2/9     02/09/2024   10:23 AM 01/30/2024   11:08 AM 01/30/2024   10:59 AM 12/27/2023   10:24 AM 10/06/2023    1:56 PM 08/11/2023    2:57 PM 07/12/2023    2:21 PM  Depression screen PHQ 2/9  Decreased Interest 0 0 0 0 0 0 0  Down, Depressed, Hopeless 0 0 0 0 0 0 0  PHQ - 2 Score 0 0 0 0 0 0 0  Altered sleeping    0 0    Tired, decreased energy    0 0    Change in appetite    0 0    Feeling bad or failure about yourself     0 0    Trouble concentrating    0 0    Moving slowly or fidgety/restless    0 0    Suicidal thoughts    0 0    PHQ-9 Score    0 0      Review of Systems   Musculoskeletal:  Positive for gait problem.       Pain in: both knees, right foot, left arm  & hand  All other systems reviewed and are negative.      Objective:  Physical Exam Vitals and nursing note reviewed.  Constitutional:      Appearance: Normal appearance.  Cardiovascular:     Rate and Rhythm: Normal rate and regular rhythm.     Pulses: Normal pulses.     Heart sounds: Normal heart sounds.  Pulmonary:     Effort: Pulmonary effort is normal.     Breath sounds: Normal breath sounds.  Musculoskeletal:     Comments: Normal Muscle Bulk and Muscle Testing Reveals:  Upper Extremities:Right: Full  ROM and Muscle Strength  5/5 Left Upper Extremity : Decreased ROM 90 Degrees and Muscle Strength 5/5 Left AC Joint Tenderness Thoracic Paraspinal Tenderness: T-1-T-7 Mainly Left Side  Lower Extremities: Full ROM and Muscle Strength 5/5 Arises from Table with ease Narrow Based  Gait     Skin:    General: Skin is warm and dry.  Neurological:     Mental Status: She is alert and oriented to person, place, and time.  Psychiatric:        Mood and Affect: Mood normal.        Behavior: Behavior normal.          Assessment & Plan:  1.Complex regional pain syndrome right lower extremity postoperative. She's s/p post plantar fascial release. She continue with sensitivity to touch. She has diffuse numbness and tingling.Continue Gabapentin  . . 02/23/2024. Nucynta  discontinued due to Urine Clearance. .We will continue the opioid monitoring program, this consists of regular clinic visits, examinations, urine drug screen, pill counts as well as use of [place]  Controlled Substance Reporting system. A 12 month History has been reviewed on the [place]  Controlled Substance Reporting System on 02/23/2024. 2.Myofascial pain syndrome: Continue current medication regime with Tizanidine , only at HS while prescribe Methocarbamol by Ortho, she verbalizes understanding. Continue with ice,  heat and exercise regime. 02/23/2024 3. Depression: Continue: Zoloft. PCP Following. 02/23/2024 4. Cervical Post Laminectomy: Continue to Monitor. 02/23/2024 5. Cervicalgia/Cervical  Radiculopathy/ Left shoulder, Left Arm and Left  Hand Pain with tingling,  . Continue to monitor. 02/23/2024 6. Poly Neuropathy: Continue current medication regimen with Nortriptyline   and :Continue to Monitor. 02/23/2024 7. Left Shoulder Tendonitis: Chronic Left Shoulder Pain:  Ortho Following. Continue current medication regime and Continue  to Monitor. 02/23/2024 8. Right hand pain/ Post Surgical  Procedure: Trigger Finger Release: Dr. Theophilus Following.  No complaints Today. 02/23/2024. 9. Tremor: No complaints today. Continue to monitor.  02/23/2024 10. Chronic Bilateral  Knee Pain: Orthopedics Following: Continue with HEP as Tolerated. Continue to Monitor. 02/23/2024 11. Chronic Right  Foot Pain. Podiatry Following.Continue with HEP as tolerated. Continue current medication regimen. Continue to monitor. 02/23/2024  12. Left Greater Trochanter Bursitis: No complaints today. Continue to alternate Ice and Heat Therapy. Continue to Monitor. 02/23/2024  13. Right Lateral Epicondylitis:  No complaints today. Ortho Following. Continue to Monitor.  02/23/2024  14/ Fall subsequent encounter/ No falls: . Refilled : Oxycodone  10 mg one tablet 5 times a day as needed for pain. #150. We will continue with slow weaning, she verbalizes understanding. With a goal to resume Oxycodone  10 mg 4 times a day as needed for pain. We will continue the opioid monitoring program, this consists of regular clinic visits, examinations, urine drug screen, pill counts as well as use of [place]  Controlled Substance Reporting system. A 12 month History has been reviewed on the [place]  Controlled Substance Reporting System on 02/23/2024.  15. Left Elbow Pain: No complaints today. Ortho Following. Continue Physical Therapy.  02/23/2024 16.  Neuropathic Pain: Continue Gabapentin  as prescribed. Continue To Monitor. 02/23/2024      F/U in 1 month

## 2024-02-23 ENCOUNTER — Encounter: Payer: Self-pay | Admitting: Registered Nurse

## 2024-02-23 ENCOUNTER — Encounter: Attending: Registered Nurse | Admitting: Registered Nurse

## 2024-02-23 VITALS — BP 103/69 | HR 65 | Ht 67.0 in | Wt 249.0 lb

## 2024-02-23 DIAGNOSIS — M1712 Unilateral primary osteoarthritis, left knee: Secondary | ICD-10-CM | POA: Diagnosis present

## 2024-02-23 DIAGNOSIS — M792 Neuralgia and neuritis, unspecified: Secondary | ICD-10-CM | POA: Insufficient documentation

## 2024-02-23 DIAGNOSIS — M1711 Unilateral primary osteoarthritis, right knee: Secondary | ICD-10-CM | POA: Diagnosis present

## 2024-02-23 DIAGNOSIS — M25512 Pain in left shoulder: Secondary | ICD-10-CM | POA: Insufficient documentation

## 2024-02-23 DIAGNOSIS — M5412 Radiculopathy, cervical region: Secondary | ICD-10-CM | POA: Insufficient documentation

## 2024-02-23 DIAGNOSIS — G90521 Complex regional pain syndrome I of right lower limb: Secondary | ICD-10-CM | POA: Diagnosis present

## 2024-02-23 DIAGNOSIS — G894 Chronic pain syndrome: Secondary | ICD-10-CM | POA: Insufficient documentation

## 2024-02-23 DIAGNOSIS — Z79891 Long term (current) use of opiate analgesic: Secondary | ICD-10-CM | POA: Insufficient documentation

## 2024-02-23 DIAGNOSIS — G8929 Other chronic pain: Secondary | ICD-10-CM | POA: Diagnosis present

## 2024-02-23 DIAGNOSIS — M542 Cervicalgia: Secondary | ICD-10-CM | POA: Diagnosis present

## 2024-02-23 DIAGNOSIS — Z5181 Encounter for therapeutic drug level monitoring: Secondary | ICD-10-CM | POA: Diagnosis not present

## 2024-02-23 MED ORDER — OXYCODONE HCL 10 MG PO TABS: 10.0000 mg | ORAL_TABLET | Freq: Every day | ORAL | 0 refills | Status: DC | PRN

## 2024-02-27 LAB — DRUG TOX MONITOR 1 W/CONF, ORAL FLD
Amphetamines: NEGATIVE ng/mL (ref <10)
Barbiturates: NEGATIVE ng/mL (ref <10)
Benzodiazepines: NEGATIVE ng/mL (ref <0.50)
Buprenorphine: NEGATIVE ng/mL (ref <0.10)
Cocaine: NEGATIVE ng/mL (ref <5.0)
Codeine: NEGATIVE ng/mL (ref <2.5)
Dihydrocodeine: NEGATIVE ng/mL (ref <2.5)
Fentanyl: NEGATIVE ng/mL (ref <0.10)
Heroin Metabolite: NEGATIVE ng/mL (ref <1.0)
Hydrocodone: NEGATIVE ng/mL (ref <2.5)
Hydromorphone: NEGATIVE ng/mL (ref <2.5)
MARIJUANA: NEGATIVE ng/mL (ref <2.5)
MDMA: NEGATIVE ng/mL (ref <10)
Meprobamate: NEGATIVE ng/mL (ref <2.5)
Methadone: NEGATIVE ng/mL (ref <5.0)
Morphine: NEGATIVE ng/mL (ref <2.5)
Nicotine Metabolite: NEGATIVE ng/mL (ref <5.0)
Norhydrocodone: NEGATIVE ng/mL (ref <2.5)
Noroxycodone: 17.3 ng/mL — ABNORMAL HIGH (ref <2.5)
Opiates: POSITIVE ng/mL — AB (ref <2.5)
Oxycodone: 110.5 ng/mL — ABNORMAL HIGH (ref <2.5)
Oxymorphone: NEGATIVE ng/mL (ref <2.5)
Phencyclidine: NEGATIVE ng/mL (ref <10)
Tapentadol: NEGATIVE ng/mL (ref <5.0)
Tramadol: NEGATIVE ng/mL (ref <5.0)
Zolpidem: NEGATIVE ng/mL (ref <5.0)

## 2024-02-27 LAB — DRUG TOX ALC METAB W/CON, ORAL FLD: Alcohol Metabolite: NEGATIVE ng/mL (ref <25)

## 2024-03-23 NOTE — Progress Notes (Signed)
 Subjective:    Patient ID: Sabrina Williams, female    DOB: 1971-04-19, 53 y.o.   MRN: [id number]  HPI: Sabrina Williams is a 53 y.o. female who returns for follow up appointment for chronic pain and medication refill. She states her pain is located in her neck radiating into her left shoulder, left arm and left hand with tingling and burning. She also reports bilateral knee pain and right foot with tingling and burning.She rates her pain 3. Her current exercise regime is walking and performing stretching exercises.  Sabrina Williams Morphine  equivalent is 75.00 MME.   Last Oral Swab was Performed on 02/23/2024, it was consistent.    Pain Inventory Average Pain 4 Pain Right Now 3 My pain is constant, burning, dull, stabbing, tingling, and aching  In the last 24 hours, has pain interfered with the following? General activity 6 Relation with others 4 Enjoyment of life 5 What TIME of day is your pain at its worst? night Sleep (in general) Fair  Pain is worse with: walking, standing, and some activites Pain improves with: rest, heat/ice, therapy/exercise, and medication Relief from Meds: 8  Family History  Problem Relation Age of Onset   Cancer Mother        multiple myeloma   Diabetes Father    Kidney disease Father    Heart disease Father    Social History   Socioeconomic History   Marital status: Married    Spouse name: Not on file   Number of children: 2   Years of education: some college   Highest education level: Not on file  Occupational History   Occupation: homemaker  Tobacco Use   Smoking status: Never   Smokeless tobacco: Never  Vaping Use   Vaping status: Never Used  Substance and Sexual Activity   Alcohol  use: No    Alcohol /week: 0.0 standard drinks of alcohol    Drug use: No   Sexual activity: Not Currently  Other Topics Concern   Not on file  Social History Narrative   Lives at home with husband.   Right-handed.   No daily caffeine use.   Social  Drivers of Corporate Investment Banker Strain: Low Risk  (08/16/2023)   Received from [facility]   Overall Financial Resource Strain (CARDIA)    Difficulty of Paying Living Expenses: Not hard at all  Food Insecurity: No Food Insecurity (01/30/2024)   Hunger Vital Sign    Worried About Running Out of Food in the Last Year: Never true    Ran Out of Food in the Last Year: Never true  Transportation Needs: No Transportation Needs (01/30/2024)   PRAPARE - Administrator, Civil Service (Medical): No    Lack of Transportation (Non-Medical): No  Physical Activity: Not on file  Stress: Not on file  Social Connections: Not on file   Past Surgical History:  Procedure Laterality Date   CERVICAL SPINE SURGERY  2005   C6-7 fusion   CESAREAN SECTION     x2   CYSTOSCOPY WITH RETROGRADE PYELOGRAM, URETEROSCOPY AND STENT PLACEMENT Left 06/21/2022   Procedure: CYSTOSCOPY WITH RETROGRADE PYELOGRAM, URETEROSCOPY AND STENT PLACEMENT;  Surgeon: Penne Knee, MD;  Location: [facility];  Service: Urology;  Laterality: Left;   CYSTOSCOPY/URETEROSCOPY/HOLMIUM LASER/STENT PLACEMENT Left 07/06/2022   Procedure: CYSTOSCOPY/URETEROSCOPY/HOLMIUM LASER/STENT EXCHANGE;  Surgeon: Twylla Glendia BROCKS, MD;  Location: [facility];  Service: Urology;  Laterality: Left;   dental implant  11/2013   ENDOSCOPIC PLANTAR FASCIOTOMY  Right 12/2013   HAND SURGERY Right    KNEE ARTHROSCOPY  1985   LITHOTRIPSY     multiple   PARTIAL GASTRECTOMY  01/01/2016   PLANTAR FASCIECTOMY Right    SHOULDER SURGERY Left 10/2004   SMALL INTESTINE SURGERY     cervical laminectomy   utereroscopy  1995   Past Surgical History:  Procedure Laterality Date   CERVICAL SPINE SURGERY  2005   C6-7 fusion   CESAREAN SECTION     x2   CYSTOSCOPY WITH RETROGRADE PYELOGRAM, URETEROSCOPY AND STENT PLACEMENT Left 06/21/2022   Procedure: CYSTOSCOPY WITH RETROGRADE PYELOGRAM, URETEROSCOPY AND STENT PLACEMENT;  Surgeon: Penne Knee, MD;  Location: [facility];  Service: Urology;  Laterality: Left;   CYSTOSCOPY/URETEROSCOPY/HOLMIUM LASER/STENT PLACEMENT Left 07/06/2022   Procedure: CYSTOSCOPY/URETEROSCOPY/HOLMIUM LASER/STENT EXCHANGE;  Surgeon: Twylla Glendia BROCKS, MD;  Location: [facility];  Service: Urology;  Laterality: Left;   dental implant  11/2013   ENDOSCOPIC PLANTAR FASCIOTOMY Right 12/2013   HAND SURGERY Right    KNEE ARTHROSCOPY  1985   LITHOTRIPSY     multiple   PARTIAL GASTRECTOMY  01/01/2016   PLANTAR FASCIECTOMY Right    SHOULDER SURGERY Left 10/2004   SMALL INTESTINE SURGERY     cervical laminectomy   utereroscopy  1995   Past Medical History:  Diagnosis Date   Alopecia    Anemia    Anxiety    Cervical radiculopathy at C6    Chronic kidney disease    stones   Chronic pain    Complex regional pain syndrome I    right foot   Depression    Diabetes mellitus type 2, uncomplicated (HCC)    GERD (gastroesophageal reflux disease)    Heart murmur    History of kidney stones    Hypercholesterolemia    Hypertension, essential, benign    Kidney stone    Morbid obesity (HCC)    Neuromuscular disorder (HCC)    Plantar fasciitis    Stage 4 chronic kidney disease (HCC)    Steatosis of liver    Tremor    Vitamin D deficiency    LMP 12/02/2019   Opioid Risk Score:   Fall Risk Score:  `1  Depression screen [facility] 2/9     02/23/2024    1:53 PM 02/09/2024   10:23 AM 01/30/2024   11:08 AM 01/30/2024   10:59 AM 12/27/2023   10:24 AM 10/06/2023    1:56 PM 08/11/2023    2:57 PM  Depression screen PHQ 2/9  Decreased Interest 0 0 0 0 0 0 0  Down, Depressed, Hopeless 0 0 0 0 0 0 0  PHQ - 2 Score 0 0 0 0 0 0 0  Altered sleeping     0 0   Tired, decreased energy     0 0   Change in appetite     0 0   Feeling bad or failure about yourself      0 0   Trouble concentrating     0 0   Moving slowly or fidgety/restless     0 0   Suicidal thoughts     0 0   PHQ-9 Score     0 0    Review of Systems   Musculoskeletal:  Positive for back pain and gait problem.       Left shoulder pain down to left hand, pain in both knees, right foot pain  All other systems reviewed and are negative.  Objective:   Physical Exam Vitals and nursing note reviewed.  Constitutional:      Appearance: Normal appearance. She is obese.  Cardiovascular:     Rate and Rhythm: Normal rate and regular rhythm.     Pulses: Normal pulses.     Heart sounds: Normal heart sounds.  Pulmonary:     Effort: Pulmonary effort is normal.     Breath sounds: Normal breath sounds.  Musculoskeletal:     Comments: Normal Muscle Bulk and Muscle Testing Reveals:  Upper Extremities: Full ROM and Muscle Strength 5/5 Lower Extremities : Full ROM and Muscle Strength 5/5 Arises from Table with ease Narrow Based Gait     Skin:    General: Skin is warm and dry.  Neurological:     Mental Status: She is alert and oriented to person, place, and time.  Psychiatric:        Mood and Affect: Mood normal.        Behavior: Behavior normal.          Assessment & Plan:  1.Complex regional pain syndrome right lower extremity postoperative. She's s/p post plantar fascial release. She continue with sensitivity to touch. She has diffuse numbness and tingling.Continue Gabapentin  . . 03/26/2024. Nucynta  discontinued due to Urine Clearance. .We will continue the opioid monitoring program, this consists of regular clinic visits, examinations, urine drug screen, pill counts as well as use of [place]  Controlled Substance Reporting system. A 12 month History has been reviewed on the [place]  Controlled Substance Reporting System on 03/26/2024. 2.Myofascial pain syndrome: Continue current medication regime with Tizanidine , only at HS while prescribe Methocarbamol by Ortho, she verbalizes understanding. Continue with ice, heat and exercise regime. 03/26/2024 3. Depression: Continue: Zoloft. PCP Following. 03/26/2024 4. Cervical Post  Laminectomy: Continue to Monitor. 03/26/2024 5. Cervicalgia/Cervical  Radiculopathy/ Left shoulder, Left Arm and Left  Hand Pain with tingling,  . Continue to monitor. 03/26/2024 6. Poly Neuropathy: Continue current medication regimen with Nortriptyline   and :Continue to Monitor. 03/26/2024 7. Left Shoulder Tendonitis: Chronic Left Shoulder Pain:  Ortho Following. Continue current medication regime and Continue  to Monitor. 03/26/2024 8. Right hand pain/ Post Surgical  Procedure: Trigger Finger Release: Dr. Theophilus Following.  No complaints Today. 03/26/2024. 9. Tremor: No complaints today. Continue to monitor.  03/26/2024 10. Chronic Bilateral  Knee Pain: Orthopedics Following: Continue with HEP as Tolerated. Continue to Monitor. 03/26/2024 11. Chronic Right  Foot Pain. Podiatry Following.Continue with HEP as tolerated. Continue current medication regimen. Continue to monitor. 03/26/2024  12. Left Greater Trochanter Bursitis: No complaints today. Continue to alternate Ice and Heat Therapy. Continue to Monitor. 03/26/2024  13. Right Lateral Epicondylitis:  No complaints today. Ortho Following. Continue to Monitor.  03/26/2024  14/ Fall subsequent encounter/ No falls: . Refilled : Oxycodone  10 mg one tablet 5 times a day as needed for pain. #150. We will continue with slow weaning, she verbalizes understanding. With a goal to resume Oxycodone  10 mg 4 times a day as needed for pain. We will continue the opioid monitoring program, this consists of regular clinic visits, examinations, urine drug screen, pill counts as well as use of [place]  Controlled Substance Reporting system. A 12 month History has been reviewed on the [place]  Controlled Substance Reporting System on 03/26/2024.  15. Left Elbow Pain: No complaints today. Ortho Following. Continue Physical Therapy. 03/26/2024 16. Neuropathic Pain: Continue Gabapentin  as prescribed. Continue To Monitor. 03/26/2024      F/U in 1 month

## 2024-03-26 ENCOUNTER — Encounter: Attending: Registered Nurse | Admitting: Registered Nurse

## 2024-03-26 ENCOUNTER — Encounter: Payer: Self-pay | Admitting: Registered Nurse

## 2024-03-26 VITALS — BP 103/72 | HR 76 | Ht 67.0 in | Wt 248.0 lb

## 2024-03-26 DIAGNOSIS — M1711 Unilateral primary osteoarthritis, right knee: Secondary | ICD-10-CM | POA: Insufficient documentation

## 2024-03-26 DIAGNOSIS — Z5181 Encounter for therapeutic drug level monitoring: Secondary | ICD-10-CM | POA: Diagnosis present

## 2024-03-26 DIAGNOSIS — G90521 Complex regional pain syndrome I of right lower limb: Secondary | ICD-10-CM | POA: Diagnosis present

## 2024-03-26 DIAGNOSIS — Z79891 Long term (current) use of opiate analgesic: Secondary | ICD-10-CM | POA: Insufficient documentation

## 2024-03-26 DIAGNOSIS — M1712 Unilateral primary osteoarthritis, left knee: Secondary | ICD-10-CM | POA: Insufficient documentation

## 2024-03-26 DIAGNOSIS — G894 Chronic pain syndrome: Secondary | ICD-10-CM | POA: Insufficient documentation

## 2024-03-26 DIAGNOSIS — M792 Neuralgia and neuritis, unspecified: Secondary | ICD-10-CM | POA: Diagnosis present

## 2024-03-26 DIAGNOSIS — M542 Cervicalgia: Secondary | ICD-10-CM | POA: Insufficient documentation

## 2024-03-26 DIAGNOSIS — M25512 Pain in left shoulder: Secondary | ICD-10-CM | POA: Insufficient documentation

## 2024-03-26 DIAGNOSIS — M5412 Radiculopathy, cervical region: Secondary | ICD-10-CM | POA: Insufficient documentation

## 2024-03-26 DIAGNOSIS — G8929 Other chronic pain: Secondary | ICD-10-CM | POA: Insufficient documentation

## 2024-03-26 MED ORDER — OXYCODONE HCL 10 MG PO TABS: 10.0000 mg | ORAL_TABLET | Freq: Every day | ORAL | 0 refills | Status: DC | PRN

## 2024-04-05 ENCOUNTER — Encounter: Payer: Self-pay | Admitting: Urology

## 2024-04-06 ENCOUNTER — Telehealth: Payer: Self-pay

## 2024-04-06 NOTE — Telephone Encounter (Signed)
 I called patient and left voicemail that we do not take at home urine test and she would need to be seen in office. I offered for her to be seen with a PA on Monday but she needs to call back to schedule. I also stated that if she isn't able to schedule with us  she should try to seek urgent care attention.

## 2024-04-07 ENCOUNTER — Ambulatory Visit
Admission: EM | Admit: 2024-04-07 | Discharge: 2024-04-07 | Disposition: A | Discharge status: Home / Self Care | Attending: Family Medicine | Admitting: Family Medicine

## 2024-04-07 DIAGNOSIS — N3001 Acute cystitis with hematuria: Secondary | ICD-10-CM | POA: Diagnosis not present

## 2024-04-07 LAB — POCT URINE DIPSTICK
Bilirubin, UA: NEGATIVE
Glucose, UA: NEGATIVE mg/dL
Ketones, POC UA: NEGATIVE mg/dL
Nitrite, UA: NEGATIVE
Protein Ur, POC: NEGATIVE mg/dL
Spec Grav, UA: 1.015 (ref 1.010–1.025)
Urobilinogen, UA: 0.2 U/dL
pH, UA: 5.5 (ref 5.0–8.0)

## 2024-04-07 MED ORDER — CEPHALEXIN 500 MG PO CAPS: 500.0000 mg | ORAL_CAPSULE | Freq: Two times a day (BID) | ORAL | 0 refills | Status: AC

## 2024-04-07 NOTE — ED Triage Notes (Signed)
 Pt c/o possible UTI  Pt took 2 at home tests for a UTI and they were positive.   Pt states that she is having urinary discomfort and pain  Pt denies vaginal odor or discharge.

## 2024-04-07 NOTE — ED Provider Notes (Signed)
 [facility]    CSN: [id number] Arrival date & time: 04/07/24  1024      History   Chief Complaint Chief Complaint  Patient presents with   Urinary Tract Infection    HPI MAILE LINFORD is a 53 y.o. female presents for dysuria.  Patient reports 3 to 4 days of urinary burning with urgency and frequency.  Denies fevers, hematuria, nausea/vomiting, flank pain.  No vaginal discharge or STD concern.  Has not taken any OTC treatments for symptoms.  No other concerns at this time.   Urinary Tract Infection   Past Medical History:  Diagnosis Date   Alopecia    Anemia    Anxiety    Cervical radiculopathy at C6    Chronic kidney disease    stones   Chronic pain    Complex regional pain syndrome I    right foot   Depression    Diabetes mellitus type 2, uncomplicated (HCC)    GERD (gastroesophageal reflux disease)    Heart murmur    History of kidney stones    Hypercholesterolemia    Hypertension, essential, benign    Kidney stone    Morbid obesity (HCC)    Neuromuscular disorder (HCC)    Plantar fasciitis    Stage 4 chronic kidney disease (HCC)    Steatosis of liver    Tremor    Vitamin D deficiency     Patient Active Problem List   Diagnosis Date Noted   AKI (acute kidney injury) 12/03/2021   Sleep paralysis, recurrent isolated 09/03/2021   Status post bariatric surgery 09/03/2021   Postural dizziness with presyncope 09/03/2021   Hypersomnia, periodic 09/03/2021   Class 3 severe obesity due to excess calories with body mass index (BMI) of 40.0 to 44.9 in adult (HCC) 09/03/2021   Obstructive sleep apnea 08/06/2021   Dizziness 08/06/2021   Achilles tendinitis 07/31/2021   Calcaneal spur of right foot 07/14/2021   Acquired pes planus of right foot 06/24/2021   Metatarsalgia of right foot 06/24/2021   Sprain of foot 06/24/2021   Gastroesophageal reflux disease without esophagitis 05/29/2021   Vagal nerve sensitivity 05/29/2021   Alopecia 01/02/2021    Anemia 01/02/2021   Elevated parathyroid hormone 01/02/2021   Hypomagnesemia 01/02/2021   Postprandial hypoglycemia 10/14/2020   Myoclonic jerking 04/02/2019   Benign essential hypertension 03/21/2019   Chronic renal failure, stage 3b (HCC) 03/21/2019   Acquired trigger finger 05/02/2018   Lump on finger 05/02/2018   Anxiety and depression 08/17/2017   Cervical radiculitis 06/03/2017   Heart murmur, systolic 02/27/2017   Alkaline phosphatase elevation 02/21/2017   History of weight loss surgery 02/17/2017   Vitamin D deficiency 11/24/2015   Steatosis of liver 11/14/2015   Diabetes mellitus (HCC) 09/23/2015   Pure hypercholesterolemia 09/23/2015   Multiple joint pain 07/07/2015   Muscle pain 07/07/2015   Right shoulder pain 12/05/2014   Complex regional pain syndrome type 1 of right lower extremity 07/05/2014   Increased frequency of urination 08/21/2013   Kidney stone 07/04/2012   Cervical postlaminectomy syndrome 09/07/2011   Cervical radiculopathy at C6 09/07/2011    Past Surgical History:  Procedure Laterality Date   CERVICAL SPINE SURGERY  2005   C6-7 fusion   CESAREAN SECTION     x2   CYSTOSCOPY WITH RETROGRADE PYELOGRAM, URETEROSCOPY AND STENT PLACEMENT Left 06/21/2022   Procedure: CYSTOSCOPY WITH RETROGRADE PYELOGRAM, URETEROSCOPY AND STENT PLACEMENT;  Surgeon: Penne Knee, MD;  Location: [facility];  Service: Urology;  Laterality: Left;   CYSTOSCOPY/URETEROSCOPY/HOLMIUM LASER/STENT PLACEMENT Left 07/06/2022   Procedure: CYSTOSCOPY/URETEROSCOPY/HOLMIUM LASER/STENT EXCHANGE;  Surgeon: Twylla Glendia BROCKS, MD;  Location: [facility];  Service: Urology;  Laterality: Left;   dental implant  11/2013   ENDOSCOPIC PLANTAR FASCIOTOMY Right 12/2013   HAND SURGERY Right    KNEE ARTHROSCOPY  1985   LITHOTRIPSY     multiple   PARTIAL GASTRECTOMY  01/01/2016   PLANTAR FASCIECTOMY Right    SHOULDER SURGERY Left 10/2004   SMALL INTESTINE SURGERY     cervical laminectomy    utereroscopy  1995    OB History   No obstetric history on file.      Home Medications    Prior to Admission medications   Medication Sig Start Date End Date Taking? Authorizing Provider  acetaminophen  (TYLENOL ) 500 MG tablet Take by mouth.   Yes [provider]  benazepril (LOTENSIN) 20 MG tablet Take 20 mg by mouth daily. 1/2 a pill 02/05/23  Yes [provider]  calcitRIOL  (ROCALTROL ) 0.5 MCG capsule Take 0.5 mcg by mouth in the morning and at bedtime. 02/02/20  Yes [provider]  calcium  carbonate 1250 MG capsule Take 1,250 mg by mouth daily.   Yes [provider]  cephALEXin (KEFLEX) 500 MG capsule Take 1 capsule (500 mg total) by mouth 2 (two) times daily for 7 days. 04/07/24 04/14/24 Yes Sabrina Williams, Jodi R, NP  Cholecalciferol (VITAMIN D3 PO) Take 2,000 Units by mouth daily.   Yes [provider]  DULoxetine  (CYMBALTA ) 60 MG capsule Take 60 mg by mouth daily.   Yes [provider]  estradiol  (ESTRACE ) 0.1 MG/GM vaginal cream Apply one pea-sized amount around the opening of the urethra daily for 2 weeks, then 3 times weekly moving forward. 08/02/23  Yes Vaillancourt, Samantha, PA-C  ferrous sulfate  325 (65 FE) MG tablet Take 325 mg by mouth daily with breakfast. 04/07/21  Yes [provider]  Fingerstix Lancets MISC Use 1 each 2 (two) times daily As directed [Test blood sugars in rotating pattern:  fasting, before meals, 2 hours after a meal, at bedtime]   Yes [provider]  gabapentin  (NEURONTIN ) 100 MG capsule TAKE 1 CAPSULE BY MOUTH EVERY DAY 07/13/22  Yes Kirsteins, Prentice BRAVO, MD  glucose blood test strip USE 1 STRIP TWICE A DAY AS DIRECTED 02/17/15  Yes [provider]  lovastatin (MEVACOR) 20 MG tablet Take 20 mg by mouth every evening. 07/18/20  Yes [provider]  Magnesium Glycinate 100 MG CAPS Take by mouth.   Yes [provider]  Multiple Vitamins-Minerals (BARIATRIC  MULTIVITAMINS/IRON) CAPS Take 3 capsules by mouth daily before supper.   Yes [provider]  nortriptyline  (PAMELOR ) 75 MG capsule TAKE 1 CAPSULE BY MOUTH AT BEDTIME. 12/09/23  Yes Debby Fidela CROME, NP  omeprazole (PRILOSEC) 10 MG capsule Take 1 tablet by mouth daily. 12/06/22  Yes [provider]  Oxycodone  HCl 10 MG TABS Take 1 tablet (10 mg total) by mouth 5 (five) times daily as needed. 03/26/24  Yes Debby Fidela L, NP  tiZANidine  (ZANAFLEX ) 4 MG tablet TAKE 1 TABLET (4 MG TOTAL) BY MOUTH 5 (FIVE) TIMES DAILY AS NEEDED FOR MUSCLE SPASMS. 01/16/24  Yes Debby Fidela CROME, NP  torsemide (DEMADEX) 5 MG tablet Take 5 mg by mouth 2 (two) times daily.   Yes [provider]  VITAMIN C, CALCIUM  ASCORBATE, PO    Yes [provider]  Vitamin D, Ergocalciferol, (DRISDOL) 1.25 MG (50000 UNIT) CAPS  capsule TAKE 1 CAPSULE (50,000 UNITS TOTAL) BY MOUTH TWICE A WEEK FOR 30 DAYS 11/25/22  Yes [provider]    Family History Family History  Problem Relation Age of Onset   Cancer Mother        multiple myeloma   Diabetes Father    Kidney disease Father    Heart disease Father     Social History Social History   Tobacco Use   Smoking status: Never   Smokeless tobacco: Never  Vaping Use   Vaping status: Never Used  Substance Use Topics   Alcohol  use: No    Alcohol /week: 0.0 standard drinks of alcohol    Drug use: No     Allergies   Penicillins and Nsaids   Review of Systems Review of Systems  Genitourinary:  Positive for dysuria.     Physical Exam Triage Vital Signs ED Triage Vitals  Encounter Vitals Group     BP 04/07/24 1038 121/85     Girls Systolic BP Percentile --      Girls Diastolic BP Percentile --      Boys Systolic BP Percentile --      Boys Diastolic BP Percentile --      Pulse Rate 04/07/24 1038 82     Resp --      Temp 04/07/24 1038 98 F (36.7 C)     Temp Source 04/07/24 1038 Oral     SpO2 04/07/24 1038 100 %     Weight  04/07/24 1037 249 lb (112.9 kg)     Height --      Head Circumference --      Peak Flow --      Pain Score 04/07/24 1036 3     Pain Loc --      Pain Education --      Exclude from Growth Chart --    No data found.  Updated Vital Signs BP 121/85 (BP Location: Left Arm)   Pulse 82   Temp 98 F (36.7 C) (Oral)   Wt 249 lb (112.9 kg)   LMP 12/02/2019   SpO2 100%   BMI 39.00 kg/m   Visual Acuity Right Eye Distance:   Left Eye Distance:   Bilateral Distance:    Right Eye Near:   Left Eye Near:    Bilateral Near:     Physical Exam Vitals and nursing note reviewed.  Constitutional:      Appearance: Normal appearance.  HENT:     Head: Normocephalic and atraumatic.  Eyes:     Pupils: Pupils are equal, round, and reactive to light.  Cardiovascular:     Rate and Rhythm: Normal rate.  Pulmonary:     Effort: Pulmonary effort is normal.  Skin:    General: Skin is warm and dry.  Neurological:     General: No focal deficit present.     Mental Status: She is alert and oriented to person, place, and time.  Psychiatric:        Mood and Affect: Mood normal.        Behavior: Behavior normal.      UC Treatments / Results  Labs (all labs ordered are listed, but only abnormal results are displayed) Labs Reviewed  POCT URINE DIPSTICK - Abnormal; Notable for the following components:      Result Value   Clarity, UA cloudy (*)    Blood, UA trace-lysed (*)    Leukocytes, UA Large (3+) (*)    All other components within normal  limits  URINE CULTURE   Renal Function Panel Order: [id number] Component Ref Range & Units 1 mo ago  Glucose 65 - 99 mg/dL 886 High   Comment:                                                                                             Fasting reference interval                                               For someone without known diabetes, a glucose value      between 100 and 125 mg/dL is consistent with      prediabetes and should be confirmed  with a      follow-up test.         BUN 7 - 25 mg/dL 40 High   Creatinine 9.49 - 1.03 mg/dL 7.81 High   eGFR CKD-EPI CR 2021 > OR = 60 mL/min/1.37m2 26 Low   BUN/Creatinine Ratio 6 - 22 (calc) 18  Sodium 135 - 146 mmol/L 138  Potassium 3.5 - 5.3 mmol/L 4.8  Chloride 98 - 110 mmol/L 109  Bicarbonate (CO2) 20 - 32 mmol/L 23  Calcium  8.6 - 10.4 mg/dL 8.9  Phosphorus 2.5 - 4.5 mg/dL 4.8 High   Albumin 3.6 - 5.1 g/dL 3.5 Low   Resulting Agency [facility]   Specimen Collected: 02/21/24 13:30   Performed by: ORLIN SPIEGEL Last Resulted: 02/22/24 12:40  Received From: [facility]  Result Received: 02/23/24 13:51    EKG   Radiology No results found.  Procedures Procedures (including critical care time)  Medications Ordered in UC Medications - No data to display  Initial Impression / Assessment and Plan / UC Course  I have reviewed the triage vital signs and the nursing notes.  Pertinent labs & imaging results that were available during my care of the patient were reviewed by me and considered in my medical decision making (see chart for details).     UA positive for UTI, will culture and start Keflex twice daily for 7 days.  Creatinine clearance 53, no dosage adjustment indicated.  Encourage rest fluids and PCP follow-up if symptoms do not improve.  ER precautions reviewed. Final Clinical Impressions(s) / UC Diagnoses   Final diagnoses:  Acute cystitis with hematuria     Discharge Instructions      The clinic will contact you with the results of the urine culture done today if positive. Start Keflex twice daily for 7 days. Lots of rest and fluids. Please follow up with your PCP if your symptoms do not improve. Please go to the ER for any worsening symptoms. I hope you feel better soon!    ED Prescriptions     Medication Sig Dispense Auth. Provider   cephALEXin (KEFLEX) 500 MG capsule Take 1 capsule (500 mg total) by mouth 2 (two)  times daily for 7 days. 14 capsule Vasti Yagi, Jodi R, NP      PDMP not reviewed this encounter.  Loreda Myla SAUNDERS, NP 04/07/24 1115

## 2024-04-07 NOTE — Discharge Instructions (Addendum)
 The clinic will contact you with the results of the urine culture done today if positive. Start Keflex twice daily for 7 days. Lots of rest and fluids. Please follow up with your PCP if your symptoms do not improve. Please go to the ER for any worsening symptoms. I hope you feel better soon!

## 2024-04-08 ENCOUNTER — Telehealth: Payer: Self-pay | Admitting: Emergency Medicine

## 2024-04-08 DIAGNOSIS — M5412 Radiculopathy, cervical region: Secondary | ICD-10-CM | POA: Diagnosis not present

## 2024-04-08 NOTE — Telephone Encounter (Signed)
 Reordering Urine Culture

## 2024-04-10 LAB — URINE CULTURE: Culture: 70000 — AB

## 2024-04-11 ENCOUNTER — Ambulatory Visit (HOSPITAL_COMMUNITY): Payer: Self-pay

## 2024-04-17 NOTE — Progress Notes (Unsigned)
 Subjective:    Patient ID: Sabrina Williams, female    DOB: 09/07/1970, 53 y.o.   MRN: [id number]  HPI: Sabrina Williams is a 53 y.o. female who returns for follow up appointment for chronic pain and medication refill. states *** pain is located in  ***. rates pain ***. current exercise regime is walking and performing stretching exercises.  Ms. Dantes Morphine  equivalent is *** MME.   Last Oral Swab was Performed on 02/23/2024, it was consistent.      Pain Inventory Average Pain 4 Pain Right Now 3 My pain is constant, sharp, burning, dull, stabbing, tingling, and aching  In the last 24 hours, has pain interfered with the following? General activity 6 Relation with others 5 Enjoyment of life 7 What TIME of day is your pain at its worst? evening Sleep (in general) Fair  Pain is worse with: walking, standing, and some activites Pain improves with: rest, heat/ice, therapy/exercise, and medication Relief from Meds: 7  Family History  Problem Relation Age of Onset   Cancer Mother        multiple myeloma   Diabetes Father    Kidney disease Father    Heart disease Father    Social History   Socioeconomic History   Marital status: Married    Spouse name: Not on file   Number of children: 2   Years of education: some college   Highest education level: Not on file  Occupational History   Occupation: homemaker  Tobacco Use   Smoking status: Never   Smokeless tobacco: Never  Vaping Use   Vaping status: Never Used  Substance and Sexual Activity   Alcohol  use: No    Alcohol /week: 0.0 standard drinks of alcohol    Drug use: No   Sexual activity: Not Currently  Other Topics Concern   Not on file  Social History Narrative   Lives at home with husband.   Right-handed.   No daily caffeine use.   Social Drivers of Corporate Investment Banker Strain: Low Risk  (08/16/2023)   Received from [facility]   Overall Financial Resource Strain (CARDIA)     Difficulty of Paying Living Expenses: Not hard at all  Food Insecurity: No Food Insecurity (01/30/2024)   Hunger Vital Sign    Worried About Running Out of Food in the Last Year: Never true    Ran Out of Food in the Last Year: Never true  Transportation Needs: No Transportation Needs (01/30/2024)   PRAPARE - Administrator, Civil Service (Medical): No    Lack of Transportation (Non-Medical): No  Physical Activity: Not on file  Stress: Not on file  Social Connections: Not on file   Past Surgical History:  Procedure Laterality Date   CERVICAL SPINE SURGERY  2005   C6-7 fusion   CESAREAN SECTION     x2   CYSTOSCOPY WITH RETROGRADE PYELOGRAM, URETEROSCOPY AND STENT PLACEMENT Left 06/21/2022   Procedure: CYSTOSCOPY WITH RETROGRADE PYELOGRAM, URETEROSCOPY AND STENT PLACEMENT;  Surgeon: Penne Knee, MD;  Location: [facility];  Service: Urology;  Laterality: Left;   CYSTOSCOPY/URETEROSCOPY/HOLMIUM LASER/STENT PLACEMENT Left 07/06/2022   Procedure: CYSTOSCOPY/URETEROSCOPY/HOLMIUM LASER/STENT EXCHANGE;  Surgeon: Twylla Glendia BROCKS, MD;  Location: [facility];  Service: Urology;  Laterality: Left;   dental implant  11/2013   ENDOSCOPIC PLANTAR FASCIOTOMY Right 12/2013   HAND SURGERY Right    KNEE ARTHROSCOPY  1985   LITHOTRIPSY     multiple   PARTIAL GASTRECTOMY  01/01/2016   PLANTAR FASCIECTOMY Right    SHOULDER SURGERY Left 10/2004   SMALL INTESTINE SURGERY     cervical laminectomy   utereroscopy  1995   Past Surgical History:  Procedure Laterality Date   CERVICAL SPINE SURGERY  2005   C6-7 fusion   CESAREAN SECTION     x2   CYSTOSCOPY WITH RETROGRADE PYELOGRAM, URETEROSCOPY AND STENT PLACEMENT Left 06/21/2022   Procedure: CYSTOSCOPY WITH RETROGRADE PYELOGRAM, URETEROSCOPY AND STENT PLACEMENT;  Surgeon: Penne Knee, MD;  Location: [facility];  Service: Urology;  Laterality: Left;   CYSTOSCOPY/URETEROSCOPY/HOLMIUM LASER/STENT PLACEMENT Left 07/06/2022   Procedure:  CYSTOSCOPY/URETEROSCOPY/HOLMIUM LASER/STENT EXCHANGE;  Surgeon: Twylla Glendia BROCKS, MD;  Location: [facility];  Service: Urology;  Laterality: Left;   dental implant  11/2013   ENDOSCOPIC PLANTAR FASCIOTOMY Right 12/2013   HAND SURGERY Right    KNEE ARTHROSCOPY  1985   LITHOTRIPSY     multiple   PARTIAL GASTRECTOMY  01/01/2016   PLANTAR FASCIECTOMY Right    SHOULDER SURGERY Left 10/2004   SMALL INTESTINE SURGERY     cervical laminectomy   utereroscopy  1995   Past Medical History:  Diagnosis Date   Alopecia    Anemia    Anxiety    Cervical radiculopathy at C6    Chronic kidney disease    stones   Chronic pain    Complex regional pain syndrome I    right foot   Depression    Diabetes mellitus type 2, uncomplicated (HCC)    GERD (gastroesophageal reflux disease)    Heart murmur    History of kidney stones    Hypercholesterolemia    Hypertension, essential, benign    Kidney stone    Morbid obesity (HCC)    Neuromuscular disorder (HCC)    Plantar fasciitis    Stage 4 chronic kidney disease (HCC)    Steatosis of liver    Tremor    Vitamin D deficiency    LMP 12/02/2019   Opioid Risk Score:   Fall Risk Score:  `1  Depression screen [facility] 2/9     03/26/2024    2:17 PM 02/23/2024    1:53 PM 02/09/2024   10:23 AM 01/30/2024   11:08 AM 01/30/2024   10:59 AM 12/27/2023   10:24 AM 10/06/2023    1:56 PM  Depression screen PHQ 2/9  Decreased Interest 0 0 0 0 0 0 0  Down, Depressed, Hopeless  0 0 0 0 0 0  PHQ - 2 Score 0 0 0 0 0 0 0  Altered sleeping      0 0  Tired, decreased energy      0 0  Change in appetite      0 0  Feeling bad or failure about yourself       0 0  Trouble concentrating      0 0  Moving slowly or fidgety/restless      0 0  Suicidal thoughts      0 0  PHQ-9 Score      0  0      Data saved with a previous flowsheet row definition    Review of Systems  Musculoskeletal:  Positive for neck pain.       Left arm pain down to fingers,  right knee pain, right  foot pain  All other systems reviewed and are negative.      Objective:   Physical Exam        Assessment & Plan:

## 2024-04-24 ENCOUNTER — Encounter: Attending: Registered Nurse | Admitting: Registered Nurse

## 2024-04-24 ENCOUNTER — Encounter: Payer: Self-pay | Admitting: Registered Nurse

## 2024-04-24 VITALS — BP 103/71 | HR 75 | Ht 67.0 in | Wt 251.0 lb

## 2024-04-24 DIAGNOSIS — M792 Neuralgia and neuritis, unspecified: Secondary | ICD-10-CM | POA: Diagnosis present

## 2024-04-24 DIAGNOSIS — M1712 Unilateral primary osteoarthritis, left knee: Secondary | ICD-10-CM | POA: Diagnosis present

## 2024-04-24 DIAGNOSIS — Z79891 Long term (current) use of opiate analgesic: Secondary | ICD-10-CM | POA: Insufficient documentation

## 2024-04-24 DIAGNOSIS — M25512 Pain in left shoulder: Secondary | ICD-10-CM | POA: Insufficient documentation

## 2024-04-24 DIAGNOSIS — G8929 Other chronic pain: Secondary | ICD-10-CM | POA: Insufficient documentation

## 2024-04-24 DIAGNOSIS — M5412 Radiculopathy, cervical region: Secondary | ICD-10-CM | POA: Diagnosis present

## 2024-04-24 DIAGNOSIS — G894 Chronic pain syndrome: Secondary | ICD-10-CM | POA: Insufficient documentation

## 2024-04-24 DIAGNOSIS — Z5181 Encounter for therapeutic drug level monitoring: Secondary | ICD-10-CM | POA: Insufficient documentation

## 2024-04-24 DIAGNOSIS — M1711 Unilateral primary osteoarthritis, right knee: Secondary | ICD-10-CM | POA: Diagnosis present

## 2024-04-24 DIAGNOSIS — M542 Cervicalgia: Secondary | ICD-10-CM | POA: Diagnosis not present

## 2024-04-24 MED ORDER — TIZANIDINE HCL 4 MG PO TABS: 4.0000 mg | ORAL_TABLET | Freq: Every day | ORAL | 5 refills | Status: AC | PRN

## 2024-04-24 MED ORDER — OXYCODONE HCL 10 MG PO TABS: 10.0000 mg | ORAL_TABLET | Freq: Every day | ORAL | 0 refills | Status: DC | PRN

## 2024-05-23 NOTE — Progress Notes (Signed)
 "  Subjective:    Patient ID: Sabrina Williams, female    DOB: 1970-11-24, 53 y.o.   MRN: [id number]  HPI: Sabrina Williams is a 53 y.o. female who returns for follow up appointment for chronic pain and medication refill. She states her  pain is located in her neck radiating into her left shoulder and left arm, bilateral knee pain and right foot with tingling. Her  current exercise regime is walking and performing stretching exercises.  Ms. Elliff Morphine  equivalent is 75.00 MME.   Last Oral Swab was Performed on 02/23/2024, it was consistent.       I Family History  Problem Relation Age of Onset   Cancer Mother        multiple myeloma   Diabetes Father    Kidney disease Father    Heart disease Father    Social History   Socioeconomic History   Marital status: Married    Spouse name: Not on file   Number of children: 2   Years of education: some college   Highest education level: Not on file  Occupational History   Occupation: homemaker  Tobacco Use   Smoking status: Never   Smokeless tobacco: Never  Vaping Use   Vaping status: Never Used  Substance and Sexual Activity   Alcohol  use: No    Alcohol /week: 0.0 standard drinks of alcohol    Drug use: No   Sexual activity: Not Currently  Other Topics Concern   Not on file  Social History Narrative   Lives at home with husband.   Right-handed.   No daily caffeine use.   Social Drivers of Health   Tobacco Use: Low Risk (04/24/2024)   Patient History    Smoking Tobacco Use: Never    Smokeless Tobacco Use: Never    Passive Exposure: Not on file  Financial Resource Strain: Low Risk  (08/16/2023)   Received from [facility]   Overall Financial Resource Strain (CARDIA)    Difficulty of Paying Living Expenses: Not hard at all  Food Insecurity: No Food Insecurity (01/30/2024)   Epic    Worried About Running Out of Food in the Last Year: Never true    Ran Out of Food in the Last Year: Never true   Transportation Needs: No Transportation Needs (01/30/2024)   Epic    Lack of Transportation (Medical): No    Lack of Transportation (Non-Medical): No  Physical Activity: Not on file  Stress: Not on file  Social Connections: Not on file  Depression (PHQ2-9): Low Risk (04/24/2024)   Depression (PHQ2-9)    PHQ-2 Score: 0  Alcohol  Screen: Not on file  Housing: Low Risk (01/30/2024)   Epic    Unable to Pay for Housing in the Last Year: No    Number of Times Moved in the Last Year: 0    Homeless in the Last Year: No  Utilities: Not At Risk (01/30/2024)   Epic    Threatened with loss of utilities: No  Health Literacy: Not on file   Past Surgical History:  Procedure Laterality Date   CERVICAL SPINE SURGERY  2005   C6-7 fusion   CESAREAN SECTION     x2   CYSTOSCOPY WITH RETROGRADE PYELOGRAM, URETEROSCOPY AND STENT PLACEMENT Left 06/21/2022   Procedure: CYSTOSCOPY WITH RETROGRADE PYELOGRAM, URETEROSCOPY AND STENT PLACEMENT;  Surgeon: Penne Knee, MD;  Location: [facility];  Service: Urology;  Laterality: Left;   CYSTOSCOPY/URETEROSCOPY/HOLMIUM LASER/STENT PLACEMENT Left 07/06/2022   Procedure: CYSTOSCOPY/URETEROSCOPY/HOLMIUM LASER/STENT  EXCHANGE;  Surgeon: Twylla Glendia BROCKS, MD;  Location: [facility];  Service: Urology;  Laterality: Left;   dental implant  11/2013   ENDOSCOPIC PLANTAR FASCIOTOMY Right 12/2013   HAND SURGERY Right    KNEE ARTHROSCOPY  1985   LITHOTRIPSY     multiple   PARTIAL GASTRECTOMY  01/01/2016   PLANTAR FASCIECTOMY Right    SHOULDER SURGERY Left 10/2004   SMALL INTESTINE SURGERY     cervical laminectomy   utereroscopy  1995   Past Surgical History:  Procedure Laterality Date   CERVICAL SPINE SURGERY  2005   C6-7 fusion   CESAREAN SECTION     x2   CYSTOSCOPY WITH RETROGRADE PYELOGRAM, URETEROSCOPY AND STENT PLACEMENT Left 06/21/2022   Procedure: CYSTOSCOPY WITH RETROGRADE PYELOGRAM, URETEROSCOPY AND STENT PLACEMENT;  Surgeon: Penne Knee, MD;  Location: [facility];  Service: Urology;  Laterality: Left;   CYSTOSCOPY/URETEROSCOPY/HOLMIUM LASER/STENT PLACEMENT Left 07/06/2022   Procedure: CYSTOSCOPY/URETEROSCOPY/HOLMIUM LASER/STENT EXCHANGE;  Surgeon: Twylla Glendia BROCKS, MD;  Location: [facility];  Service: Urology;  Laterality: Left;   dental implant  11/2013   ENDOSCOPIC PLANTAR FASCIOTOMY Right 12/2013   HAND SURGERY Right    KNEE ARTHROSCOPY  1985   LITHOTRIPSY     multiple   PARTIAL GASTRECTOMY  01/01/2016   PLANTAR FASCIECTOMY Right    SHOULDER SURGERY Left 10/2004   SMALL INTESTINE SURGERY     cervical laminectomy   utereroscopy  1995   Past Medical History:  Diagnosis Date   Alopecia    Anemia    Anxiety    Cervical radiculopathy at C6    Chronic kidney disease    stones   Chronic pain    Complex regional pain syndrome I    right foot   Depression    Diabetes mellitus type 2, uncomplicated (HCC)    GERD (gastroesophageal reflux disease)    Heart murmur    History of kidney stones    Hypercholesterolemia    Hypertension, essential, benign    Kidney stone    Morbid obesity (HCC)    Neuromuscular disorder (HCC)    Plantar fasciitis    Stage 4 chronic kidney disease (HCC)    Steatosis of liver    Tremor    Vitamin D deficiency    LMP 12/02/2019   Opioid Risk Score:   Fall Risk Score:  `1  Depression screen PHQ 2/9     04/24/2024    1:44 PM 03/26/2024    2:17 PM 02/23/2024    1:53 PM 02/09/2024   10:23 AM 01/30/2024   11:08 AM 01/30/2024   10:59 AM 12/27/2023   10:24 AM  Depression screen PHQ 2/9  Decreased Interest 0 0 0 0 0 0 0  Down, Depressed, Hopeless 0  0 0 0 0 0  PHQ - 2 Score 0 0 0 0 0 0 0  Altered sleeping       0  Tired, decreased energy       0  Change in appetite       0  Feeling bad or failure about yourself        0  Trouble concentrating       0  Moving slowly or fidgety/restless       0  Suicidal thoughts       0  PHQ-9 Score       0      Data saved with a previous flowsheet row definition     Review of Systems  Objective:   Physical Exam Vitals and nursing note reviewed.  Constitutional:      Appearance: Normal appearance. She is obese.  Cardiovascular:     Rate and Rhythm: Normal rate and regular rhythm.     Pulses: Normal pulses.     Heart sounds: Normal heart sounds.  Pulmonary:     Effort: Pulmonary effort is normal.     Breath sounds: Normal breath sounds.  Musculoskeletal:     Comments: Normal Muscle Bulk and Muscle Testing Reveals:  Upper Extremities:Full  ROM and Muscle Strength 5/5  Lower Extremities: Full ROM and Muscle strength 5/5 Arises from Table with ease Narrow Based  Gait     Skin:    General: Skin is warm and dry.  Neurological:     Mental Status: She is oriented to person, place, and time.  Psychiatric:        Mood and Affect: Mood normal.        Behavior: Behavior normal.          Assessment & Plan:  1.Complex regional pain syndrome right lower extremity postoperative. She's s/p post plantar fascial release. She continue with sensitivity to touch. She has diffuse numbness and tingling.Continue Gabapentin  . . 05/25/2024. Nucynta  discontinued due to Urine Clearance. .We will continue the opioid monitoring program, this consists of regular clinic visits, examinations, urine drug screen, pill counts as well as use of [place]  Controlled Substance Reporting system. A 12 month History has been reviewed on the [place]  Controlled Substance Reporting System on 04/24/2024. 2.Myofascial pain syndrome: Continue current medication regime with Tizanidine , only at HS while prescribe Methocarbamol by Ortho, she verbalizes understanding. Continue with ice, heat and exercise regime. 05/25/2024 3. Depression: Continue: Zoloft. PCP Following. 05/25/2024 4. Cervical Post Laminectomy: Continue to Monitor. 05/25/2024 5. Cervicalgia/Cervical  Radiculopathy/ Left shoulder, Left Arm and Left  Hand Pain with tingling,  . Continue to monitor.  05/25/2024 6. Poly Neuropathy: Continue current medication regimen with Nortriptyline   and :Continue to Monitor. 05/25/2024 7. Left Shoulder Tendonitis: Chronic Left Shoulder Pain:  Ortho Following. Continue current medication regime and Continue  to Monitor. 05/25/2024 8. Right hand pain/ Post Surgical  Procedure: Trigger Finger Release: Dr. Theophilus Following.  No complaints Today. 05/25/2024. 9. Tremor: No complaints today. Continue to monitor.  05/25/2024 10. Chronic Bilateral  Knee Pain: Orthopedics Following: Continue with HEP as Tolerated. Continue to Monitor. 05/25/2024 11. Chronic Right  Foot Pain. Podiatry Following.Continue with HEP as tolerated. Continue current medication regimen. Continue to monitor. 05/25/2024  12. Left Greater Trochanter Bursitis: No complaints today. Continue to alternate Ice and Heat Therapy. Continue to Monitor. 05/25/2024  13. Right Lateral Epicondylitis:  No complaints today. Ortho Following. Continue to Monitor.  05/25/2024  14/ Fall subsequent encounter/ No falls: . Refilled : Oxycodone  10 mg one tablet 5 times a day as needed for pain. #150. We will continue with slow weaning, she verbalizes understanding. With a goal to resume Oxycodone  10 mg 4 times a day as needed for pain. We will continue the opioid monitoring program, this consists of regular clinic visits, examinations, urine drug screen, pill counts as well as use of [place]  Controlled Substance Reporting system. A 12 month History has been reviewed on the   Controlled Substance Reporting System on 05/25/2024.  15. Left Elbow Pain: No complaints today. Ortho Following. Continue Physical Therapy. 05/25/2024 16. Neuropathic Pain: Continue Gabapentin  as prescribed. Continue To Monitor. 05/25/2024      F/U in 1 month      "

## 2024-05-25 ENCOUNTER — Encounter: Attending: Registered Nurse | Admitting: Registered Nurse

## 2024-05-25 VITALS — BP 112/77 | HR 81 | Ht 66.0 in | Wt 244.6 lb

## 2024-05-25 DIAGNOSIS — G8929 Other chronic pain: Secondary | ICD-10-CM | POA: Diagnosis present

## 2024-05-25 DIAGNOSIS — Z5181 Encounter for therapeutic drug level monitoring: Secondary | ICD-10-CM | POA: Diagnosis not present

## 2024-05-25 DIAGNOSIS — M542 Cervicalgia: Secondary | ICD-10-CM | POA: Insufficient documentation

## 2024-05-25 DIAGNOSIS — M792 Neuralgia and neuritis, unspecified: Secondary | ICD-10-CM | POA: Insufficient documentation

## 2024-05-25 DIAGNOSIS — G894 Chronic pain syndrome: Secondary | ICD-10-CM | POA: Diagnosis not present

## 2024-05-25 DIAGNOSIS — Z79891 Long term (current) use of opiate analgesic: Secondary | ICD-10-CM | POA: Diagnosis not present

## 2024-05-25 DIAGNOSIS — M25512 Pain in left shoulder: Secondary | ICD-10-CM | POA: Diagnosis not present

## 2024-05-25 DIAGNOSIS — M5412 Radiculopathy, cervical region: Secondary | ICD-10-CM | POA: Diagnosis not present

## 2024-05-25 DIAGNOSIS — M1711 Unilateral primary osteoarthritis, right knee: Secondary | ICD-10-CM | POA: Diagnosis not present

## 2024-05-25 DIAGNOSIS — M1712 Unilateral primary osteoarthritis, left knee: Secondary | ICD-10-CM | POA: Insufficient documentation

## 2024-05-25 MED ORDER — OXYCODONE HCL 10 MG PO TABS: 10.0000 mg | ORAL_TABLET | Freq: Every day | ORAL | 0 refills | Status: DC | PRN

## 2024-05-28 ENCOUNTER — Telehealth: Payer: Self-pay | Admitting: *Deleted

## 2024-05-28 NOTE — Telephone Encounter (Signed)
 Prior auth initiated with [facility] via CoverMyMeds Marc Ambrocio (Key: [id number]) for oxycodone  10 mg IR.

## 2024-05-28 NOTE — Telephone Encounter (Signed)
 Information regarding your request This medication or product is on your plan's list of covered drugs. Prior authorization is not required at this time. If your pharmacy has questions regarding the processing of your prescription, please have them call the [facility] pharmacy help desk at [phone].   Pt notified by Mychart

## 2024-06-08 ENCOUNTER — Ambulatory Visit
Admission: EM | Admit: 2024-06-08 | Discharge: 2024-06-08 | Disposition: A | Discharge status: Home / Self Care | Attending: Physician Assistant | Admitting: Physician Assistant

## 2024-06-08 ENCOUNTER — Encounter: Payer: Self-pay | Admitting: Emergency Medicine

## 2024-06-08 DIAGNOSIS — R21 Rash and other nonspecific skin eruption: Secondary | ICD-10-CM | POA: Diagnosis not present

## 2024-06-08 MED ORDER — TRIAMCINOLONE ACETONIDE 0.1 % EX CREA: 1.0000 | TOPICAL_CREAM | Freq: Two times a day (BID) | CUTANEOUS | 0 refills | Status: AC

## 2024-06-08 NOTE — ED Provider Notes (Signed)
 " [facility]    CSN: [id number] Arrival date & time: 06/08/24  1451      History   Chief Complaint Chief Complaint  Patient presents with   Cellulitis    HPI Sabrina Williams is a 54 y.o. female presenting for rash, pruritus and tenderness of the left upper arm that occurred 1 to 2 days after receiving COVID-19 vaccination.  She has had previous COVID vaccines and this has never happened before.  She says the pain and swelling were worse yesterday and have improved today.  She has applied ice a couple times and taken Tylenol  as well as home oxycodone .  She denies fever, chills, sweats.  She has a couple EMT friends who told her they thought she could have cellulitis.  She has a history of sepsis and thought she would have this checked out.  HPI  Past Medical History:  Diagnosis Date   Alopecia    Anemia    Anxiety    Cervical radiculopathy at C6    Chronic kidney disease    stones   Chronic pain    Complex regional pain syndrome I    right foot   Depression    Diabetes mellitus type 2, uncomplicated (HCC)    GERD (gastroesophageal reflux disease)    Heart murmur    History of kidney stones    Hypercholesterolemia    Hypertension, essential, benign    Kidney stone    Morbid obesity (HCC)    Neuromuscular disorder (HCC)    Plantar fasciitis    Stage 4 chronic kidney disease (HCC)    Steatosis of liver    Tremor    Vitamin D deficiency     Patient Active Problem List   Diagnosis Date Noted   AKI (acute kidney injury) 12/03/2021   Sleep paralysis, recurrent isolated 09/03/2021   Status post bariatric surgery 09/03/2021   Postural dizziness with presyncope 09/03/2021   Hypersomnia, periodic 09/03/2021   Class 3 severe obesity due to excess calories with body mass index (BMI) of 40.0 to 44.9 in adult (HCC) 09/03/2021   Obstructive sleep apnea 08/06/2021   Dizziness 08/06/2021   Achilles tendinitis 07/31/2021   Calcaneal spur of right foot 07/14/2021    Acquired pes planus of right foot 06/24/2021   Metatarsalgia of right foot 06/24/2021   Sprain of foot 06/24/2021   Gastroesophageal reflux disease without esophagitis 05/29/2021   Vagal nerve sensitivity 05/29/2021   Alopecia 01/02/2021   Anemia 01/02/2021   Elevated parathyroid hormone 01/02/2021   Hypomagnesemia 01/02/2021   Postprandial hypoglycemia 10/14/2020   Myoclonic jerking 04/02/2019   Benign essential hypertension 03/21/2019   Chronic renal failure, stage 3b (HCC) 03/21/2019   Acquired trigger finger 05/02/2018   Lump on finger 05/02/2018   Anxiety and depression 08/17/2017   Cervical radiculitis 06/03/2017   Heart murmur, systolic 02/27/2017   Alkaline phosphatase elevation 02/21/2017   History of weight loss surgery 02/17/2017   Vitamin D deficiency 11/24/2015   Steatosis of liver 11/14/2015   Diabetes mellitus (HCC) 09/23/2015   Pure hypercholesterolemia 09/23/2015   Multiple joint pain 07/07/2015   Muscle pain 07/07/2015   Right shoulder pain 12/05/2014   Complex regional pain syndrome type 1 of right lower extremity 07/05/2014   Increased frequency of urination 08/21/2013   Kidney stone 07/04/2012   Cervical postlaminectomy syndrome 09/07/2011   Cervical radiculopathy at C6 09/07/2011    Past Surgical History:  Procedure Laterality Date   CERVICAL SPINE SURGERY  2005  C6-7 fusion   CESAREAN SECTION     x2   CYSTOSCOPY WITH RETROGRADE PYELOGRAM, URETEROSCOPY AND STENT PLACEMENT Left 06/21/2022   Procedure: CYSTOSCOPY WITH RETROGRADE PYELOGRAM, URETEROSCOPY AND STENT PLACEMENT;  Surgeon: Penne Knee, MD;  Location: [facility];  Service: Urology;  Laterality: Left;   CYSTOSCOPY/URETEROSCOPY/HOLMIUM LASER/STENT PLACEMENT Left 07/06/2022   Procedure: CYSTOSCOPY/URETEROSCOPY/HOLMIUM LASER/STENT EXCHANGE;  Surgeon: Twylla Glendia BROCKS, MD;  Location: [facility];  Service: Urology;  Laterality: Left;   dental implant  11/2013   ENDOSCOPIC PLANTAR FASCIOTOMY Right  12/2013   HAND SURGERY Right    KNEE ARTHROSCOPY  1985   LITHOTRIPSY     multiple   PARTIAL GASTRECTOMY  01/01/2016   PLANTAR FASCIECTOMY Right    SHOULDER SURGERY Left 10/2004   SMALL INTESTINE SURGERY     cervical laminectomy   utereroscopy  1995    OB History   No obstetric history on file.      Home Medications    Prior to Admission medications  Medication Sig Start Date End Date Taking? Authorizing Provider  triamcinolone  cream (KENALOG ) 0.1 % Apply 1 Application topically 2 (two) times daily. 06/08/24  Yes Arvis Jolan NOVAK, PA-C  acetaminophen  (TYLENOL ) 500 MG tablet Take by mouth.    [provider]  benazepril (LOTENSIN) 20 MG tablet Take 20 mg by mouth daily. 1/2 a pill 02/05/23   [provider]  calcitRIOL  (ROCALTROL ) 0.5 MCG capsule Take 0.5 mcg by mouth in the morning and at bedtime. 02/02/20   [provider]  calcium  carbonate 1250 MG capsule Take 1,250 mg by mouth daily.    [provider]  Cholecalciferol (VITAMIN D3 PO) Take 2,000 Units by mouth daily.    [provider]  DULoxetine  (CYMBALTA ) 60 MG capsule Take 60 mg by mouth daily.    [provider]  estradiol  (ESTRACE ) 0.1 MG/GM vaginal cream Apply one pea-sized amount around the opening of the urethra daily for 2 weeks, then 3 times weekly moving forward. 08/02/23   Vaillancourt, Samantha, PA-C  ferrous sulfate  325 (65 FE) MG tablet Take 325 mg by mouth daily with breakfast. 04/07/21   [provider]  Fingerstix Lancets MISC Use 1 each 2 (two) times daily As directed [Test blood sugars in rotating pattern:  fasting, before meals, 2 hours after a meal, at bedtime]    [provider]  gabapentin  (NEURONTIN ) 100 MG capsule TAKE 1 CAPSULE BY MOUTH EVERY DAY 07/13/22   Kirsteins, Prentice BRAVO, MD  glucose blood test strip USE 1 STRIP TWICE A DAY AS DIRECTED 02/17/15   [provider]  lovastatin (MEVACOR) 20 MG tablet Take 20 mg by mouth every  evening. 07/18/20   [provider]  Magnesium Glycinate 100 MG CAPS Take by mouth.    [provider]  Multiple Vitamins-Minerals (BARIATRIC MULTIVITAMINS/IRON) CAPS Take 3 capsules by mouth daily before supper.    [provider]  nortriptyline  (PAMELOR ) 75 MG capsule TAKE 1 CAPSULE BY MOUTH AT BEDTIME. 12/09/23   Debby Fidela CROME, NP  omeprazole (PRILOSEC) 10 MG capsule Take 1 tablet by mouth daily. 12/06/22   [provider]  Oxycodone  HCl 10 MG TABS Take 1 tablet (10 mg total) by mouth 5 (five) times daily as needed. 05/25/24   Debby Fidela CROME, NP  tiZANidine  (ZANAFLEX ) 4 MG tablet Take 1 tablet (4 mg total) by mouth 5 (five) times daily as needed for muscle spasms. 04/24/24   Debby Fidela CROME, NP  torsemide (DEMADEX) 5 MG  tablet Take 5 mg by mouth 2 (two) times daily.    [provider]  VITAMIN C, CALCIUM  ASCORBATE, PO     [provider]  Vitamin D, Ergocalciferol, (DRISDOL) 1.25 MG (50000 UNIT) CAPS capsule TAKE 1 CAPSULE (50,000 UNITS TOTAL) BY MOUTH TWICE A WEEK FOR 30 DAYS 11/25/22   [provider]    Family History Family History  Problem Relation Age of Onset   Cancer Mother        multiple myeloma   Diabetes Father    Kidney disease Father    Heart disease Father     Social History Social History[1]   Allergies   Penicillins and Nsaids   Review of Systems Review of Systems  Constitutional:  Negative for fatigue and fever.  Musculoskeletal:  Positive for arthralgias (mild discomfor left upper arm). Negative for joint swelling.  Skin:  Positive for color change and rash.  Neurological:  Negative for weakness and numbness.     Physical Exam Triage Vital Signs ED Triage Vitals [06/08/24 1504]  Encounter Vitals Group     BP      Girls Systolic BP Percentile      Girls Diastolic BP Percentile      Boys Systolic BP Percentile      Boys Diastolic BP Percentile      Pulse      Resp      Temp      Temp  src      SpO2      Weight 244 lb 7.8 oz (110.9 kg)     Height 5' 6 (1.676 m)     Head Circumference      Peak Flow      Pain Score 1     Pain Loc      Pain Education      Exclude from Growth Chart    No data found.  Updated Vital Signs BP 128/86 (BP Location: Right Arm)   Pulse 66   Temp 97.8 F (36.6 C) (Oral)   Resp 15   Ht 5' 6 (1.676 m)   Wt 244 lb 7.8 oz (110.9 kg)   LMP 12/02/2019   SpO2 100%   BMI 39.46 kg/m    Physical Exam Vitals and nursing note reviewed.  Constitutional:      General: She is not in acute distress.    Appearance: Normal appearance. She is not ill-appearing or toxic-appearing.  HENT:     Head: Normocephalic and atraumatic.  Eyes:     General: No scleral icterus.       Right eye: No discharge.        Left eye: No discharge.     Conjunctiva/sclera: Conjunctivae normal.  Cardiovascular:     Rate and Rhythm: Normal rate and regular rhythm.     Heart sounds: Normal heart sounds.  Pulmonary:     Effort: Pulmonary effort is normal. No respiratory distress.     Breath sounds: Normal breath sounds.  Musculoskeletal:     Cervical back: Neck supple.  Skin:    General: Skin is dry.     Findings: Erythema and rash present.  Neurological:     General: No focal deficit present.     Mental Status: She is alert. Mental status is at baseline.     Motor: No weakness.     Gait: Gait normal.  Psychiatric:        Mood and Affect: Mood normal.  Behavior: Behavior normal.     Left upper arm: See image included in chart.  13 cm x 13 cm area of erythema, slight warmth and mild tenderness to palpation.   UC Treatments / Results  Labs (all labs ordered are listed, but only abnormal results are displayed) Labs Reviewed - No data to display  EKG   Radiology No results found.  Procedures Procedures (including critical care time)  Medications Ordered in UC Medications - No data to display  Initial Impression / Assessment and Plan / UC  Course  I have reviewed the triage vital signs and the nursing notes.  Pertinent labs & imaging results that were available during my care of the patient were reviewed by me and considered in my medical decision making (see chart for details).   54 year old female presents for an area of erythematous rash of the left upper arm for the past couple days.  Symptoms started 1 to 2 days after she received COVID-vaccine.  COVID vaccination was given on Tuesday.  She has concerns for possible cellulitis.  She denies any severe significant pain or fever.  Symptoms are less than yesterday.  Not taking any antihistamines.  Applied ice once or twice.  See image included in chart.  Patient has an area of erythematous rash measuring 13 cm x 13 cm.  Mild increased warmth and slight tenderness.  Exam consistent with localized skin reaction.  Supportive care encouraged.  I sent topical triamcinolone  cream.  Encouraged cryotherapy, continuing Tylenol  and monitoring.  I drew a line around the area of redness with a skin marker and encouraged her to call or return if the redness spreads outside this region, she develops a fever or increased pain.  Patient is agreeable with plan.   Final Clinical Impressions(s) / UC Diagnoses   Final diagnoses:  Rash     Discharge Instructions      - The area of redness/rash is consistent with COVID arm.  This is a localized skin reaction to the vaccine.  This is not infectious.  I have drawn a line around the area of redness.  If the redness and swelling spread outside the line please give us  a call as that could be concerning for infection but based on exam today this is not consistent with cellulitis. - Continue applying ice to the area every couple of hours to help with swelling. - You can take Tylenol  and your home pain medication as needed. - May also start an antihistamine such as Benadryl or Claritin. - I sent a topical corticosteroid to the pharmacy to apply directly  to the area.  It should slowly begin to improve and get better within a few days.     ED Prescriptions     Medication Sig Dispense Auth. Provider   triamcinolone  cream (KENALOG ) 0.1 % Apply 1 Application topically 2 (two) times daily. 30 g Arvis Jolan KATHEE DEVONNA      PDMP not reviewed this encounter.     [1]  Social History Tobacco Use   Smoking status: Never   Smokeless tobacco: Never  Vaping Use   Vaping status: Never Used  Substance Use Topics   Alcohol  use: No    Alcohol /week: 0.0 standard drinks of alcohol    Drug use: No     Arvis Jolan KATHEE, PA-C 06/08/24 1524  "

## 2024-06-08 NOTE — Discharge Instructions (Signed)
-   The area of redness/rash is consistent with COVID arm.  This is a localized skin reaction to the vaccine.  This is not infectious.  I have drawn a line around the area of redness.  If the redness and swelling spread outside the line please give us  a call as that could be concerning for infection but based on exam today this is not consistent with cellulitis. - Continue applying ice to the area every couple of hours to help with swelling. - You can take Tylenol  and your home pain medication as needed. - May also start an antihistamine such as Benadryl or Claritin. - I sent a topical corticosteroid to the pharmacy to apply directly to the area.  It should slowly begin to improve and get better within a few days.

## 2024-06-08 NOTE — ED Triage Notes (Signed)
 Patient states that she got a COVID vaccine injection earlier this week and has redness, swelling, pain and warmth in her left upper arm.  Patient denies fevers.

## 2024-06-11 ENCOUNTER — Inpatient Hospital Stay: Attending: Oncology

## 2024-06-25 ENCOUNTER — Encounter: Attending: Registered Nurse | Admitting: Registered Nurse

## 2024-06-26 ENCOUNTER — Telehealth: Payer: Self-pay | Admitting: *Deleted

## 2024-06-26 ENCOUNTER — Other Ambulatory Visit: Payer: Self-pay | Admitting: Registered Nurse

## 2024-06-26 DIAGNOSIS — G8929 Other chronic pain: Secondary | ICD-10-CM

## 2024-06-26 DIAGNOSIS — M542 Cervicalgia: Secondary | ICD-10-CM

## 2024-06-26 DIAGNOSIS — M1712 Unilateral primary osteoarthritis, left knee: Secondary | ICD-10-CM

## 2024-06-26 DIAGNOSIS — G894 Chronic pain syndrome: Secondary | ICD-10-CM

## 2024-06-26 DIAGNOSIS — M1711 Unilateral primary osteoarthritis, right knee: Secondary | ICD-10-CM

## 2024-06-27 ENCOUNTER — Telehealth: Payer: Self-pay | Admitting: Registered Nurse

## 2024-06-27 NOTE — Telephone Encounter (Signed)
 Dr Carilyn sent prescription to pharmacy.

## 2024-07-02 ENCOUNTER — Encounter: Attending: Registered Nurse | Admitting: Registered Nurse

## 2024-07-10 ENCOUNTER — Encounter: Admitting: Registered Nurse

## 2024-10-08 ENCOUNTER — Other Ambulatory Visit

## 2024-10-08 ENCOUNTER — Ambulatory Visit: Admitting: Oncology

## 2024-12-19 ENCOUNTER — Ambulatory Visit: Admitting: Urology
